# Patient Record
Sex: Male | Born: 1940 | Race: Black or African American | Hispanic: No | Smoking: Current every day smoker
Health system: Southern US, Community
[De-identification: ages and names within clinical notes are randomized; demographics above are authoritative.]

## PROBLEM LIST (undated history)

## (undated) DIAGNOSIS — R569 Unspecified convulsions: Secondary | ICD-10-CM

## (undated) DIAGNOSIS — C801 Malignant (primary) neoplasm, unspecified: Secondary | ICD-10-CM

## (undated) DIAGNOSIS — A5217 General paresis: Secondary | ICD-10-CM

## (undated) DIAGNOSIS — G9341 Metabolic encephalopathy: Secondary | ICD-10-CM

## (undated) DIAGNOSIS — R41841 Cognitive communication deficit: Secondary | ICD-10-CM

## (undated) DIAGNOSIS — A539 Syphilis, unspecified: Secondary | ICD-10-CM

## (undated) DIAGNOSIS — R1310 Dysphagia, unspecified: Secondary | ICD-10-CM

## (undated) DIAGNOSIS — R4182 Altered mental status, unspecified: Secondary | ICD-10-CM

## (undated) DIAGNOSIS — D72819 Decreased white blood cell count, unspecified: Secondary | ICD-10-CM

## (undated) DIAGNOSIS — I509 Heart failure, unspecified: Secondary | ICD-10-CM

## (undated) DIAGNOSIS — C61 Malignant neoplasm of prostate: Secondary | ICD-10-CM

## (undated) DIAGNOSIS — F209 Schizophrenia, unspecified: Secondary | ICD-10-CM

## (undated) DIAGNOSIS — J449 Chronic obstructive pulmonary disease, unspecified: Secondary | ICD-10-CM

## (undated) DIAGNOSIS — U071 COVID-19: Secondary | ICD-10-CM

## (undated) DIAGNOSIS — F0280 Dementia in other diseases classified elsewhere without behavioral disturbance: Secondary | ICD-10-CM

## (undated) DIAGNOSIS — R4189 Other symptoms and signs involving cognitive functions and awareness: Secondary | ICD-10-CM

## (undated) DIAGNOSIS — D696 Thrombocytopenia, unspecified: Secondary | ICD-10-CM

## (undated) DIAGNOSIS — R410 Disorientation, unspecified: Secondary | ICD-10-CM

## (undated) HISTORY — PX: LEG SURGERY: SHX1003

## (undated) HISTORY — DX: Malignant neoplasm of prostate: C61

## (undated) HISTORY — DX: Disorientation, unspecified: R41.0

## (undated) HISTORY — DX: Decreased white blood cell count, unspecified: D72.819

## (undated) HISTORY — DX: Thrombocytopenia, unspecified: D69.6

## (undated) HISTORY — DX: Hypercalcemia: E83.52

## (undated) HISTORY — DX: Altered mental status, unspecified: R41.82

---

## 2016-10-24 ENCOUNTER — Emergency Department: Payer: Medicare Other

## 2016-10-24 ENCOUNTER — Encounter: Payer: Self-pay | Admitting: *Deleted

## 2016-10-24 ENCOUNTER — Inpatient Hospital Stay
Admission: EM | Admit: 2016-10-24 | Discharge: 2016-10-28 | DRG: 641 | Disposition: A | Discharge status: Home / Self Care | Payer: Medicare Other | Attending: Internal Medicine | Admitting: Internal Medicine

## 2016-10-24 DIAGNOSIS — T50905A Adverse effect of unspecified drugs, medicaments and biological substances, initial encounter: Secondary | ICD-10-CM | POA: Diagnosis present

## 2016-10-24 DIAGNOSIS — Z923 Personal history of irradiation: Secondary | ICD-10-CM

## 2016-10-24 DIAGNOSIS — R4182 Altered mental status, unspecified: Secondary | ICD-10-CM | POA: Diagnosis present

## 2016-10-24 DIAGNOSIS — I959 Hypotension, unspecified: Secondary | ICD-10-CM | POA: Diagnosis present

## 2016-10-24 DIAGNOSIS — D6959 Other secondary thrombocytopenia: Secondary | ICD-10-CM | POA: Diagnosis present

## 2016-10-24 DIAGNOSIS — R0902 Hypoxemia: Secondary | ICD-10-CM | POA: Diagnosis not present

## 2016-10-24 DIAGNOSIS — D702 Other drug-induced agranulocytosis: Secondary | ICD-10-CM | POA: Diagnosis present

## 2016-10-24 DIAGNOSIS — R531 Weakness: Secondary | ICD-10-CM

## 2016-10-24 DIAGNOSIS — R296 Repeated falls: Secondary | ICD-10-CM | POA: Diagnosis present

## 2016-10-24 DIAGNOSIS — R413 Other amnesia: Secondary | ICD-10-CM | POA: Diagnosis present

## 2016-10-24 DIAGNOSIS — Y9301 Activity, walking, marching and hiking: Secondary | ICD-10-CM | POA: Diagnosis present

## 2016-10-24 DIAGNOSIS — W1830XA Fall on same level, unspecified, initial encounter: Secondary | ICD-10-CM | POA: Diagnosis present

## 2016-10-24 DIAGNOSIS — F17210 Nicotine dependence, cigarettes, uncomplicated: Secondary | ICD-10-CM | POA: Diagnosis present

## 2016-10-24 DIAGNOSIS — F209 Schizophrenia, unspecified: Secondary | ICD-10-CM | POA: Diagnosis present

## 2016-10-24 DIAGNOSIS — Z8546 Personal history of malignant neoplasm of prostate: Secondary | ICD-10-CM

## 2016-10-24 LAB — HEPATIC FUNCTION PANEL
ALK PHOS: 60 U/L (ref 38–126)
ALT: 87 U/L — ABNORMAL HIGH (ref 17–63)
AST: 67 U/L — ABNORMAL HIGH (ref 15–41)
Albumin: 3.7 g/dL (ref 3.5–5.0)
BILIRUBIN INDIRECT: 0.3 mg/dL (ref 0.3–0.9)
BILIRUBIN TOTAL: 0.4 mg/dL (ref 0.3–1.2)
Bilirubin, Direct: 0.1 mg/dL (ref 0.1–0.5)
TOTAL PROTEIN: 6.9 g/dL (ref 6.5–8.1)

## 2016-10-24 LAB — CBC
HEMATOCRIT: 45 % (ref 40.0–52.0)
HEMATOCRIT: 44.1 % (ref 40.0–52.0)
HEMOGLOBIN: 14.5 g/dL (ref 13.0–18.0)
HEMOGLOBIN: 14 g/dL (ref 13.0–18.0)
MCH: 28.1 pg (ref 26.0–34.0)
MCH: 27.9 pg (ref 26.0–34.0)
MCHC: 32.2 g/dL (ref 32.0–36.0)
MCHC: 31.7 g/dL — ABNORMAL LOW (ref 32.0–36.0)
MCV: 87.2 fL (ref 80.0–100.0)
MCV: 87.9 fL (ref 80.0–100.0)
Platelets: 88 10*3/uL — ABNORMAL LOW (ref 150–440)
Platelets: 94 10*3/uL — ABNORMAL LOW (ref 150–440)
RBC: 5.16 MIL/uL (ref 4.40–5.90)
RBC: 5.01 MIL/uL (ref 4.40–5.90)
RDW: 14.9 % — ABNORMAL HIGH (ref 11.5–14.5)
RDW: 14.6 % — ABNORMAL HIGH (ref 11.5–14.5)
WBC: 2.6 10*3/uL — ABNORMAL LOW (ref 3.8–10.6)
WBC: 3.6 10*3/uL — ABNORMAL LOW (ref 3.8–10.6)

## 2016-10-24 LAB — URINALYSIS COMPLETE WITH MICROSCOPIC (ARMC ONLY)
Bilirubin Urine: NEGATIVE
Glucose, UA: NEGATIVE mg/dL
Hgb urine dipstick: NEGATIVE
Leukocytes, UA: NEGATIVE
Nitrite: NEGATIVE
PH: 6 (ref 5.0–8.0)
PROTEIN: NEGATIVE mg/dL
Specific Gravity, Urine: 1.014 (ref 1.005–1.030)

## 2016-10-24 LAB — BASIC METABOLIC PANEL
ANION GAP: 3 — AB (ref 5–15)
BUN: 13 mg/dL (ref 6–20)
CHLORIDE: 111 mmol/L (ref 101–111)
CO2: 30 mmol/L (ref 22–32)
Calcium: 11.9 mg/dL — ABNORMAL HIGH (ref 8.9–10.3)
Creatinine, Ser: 1.03 mg/dL (ref 0.61–1.24)
GFR calc non Af Amer: >60 mL/min (ref >60)
GLUCOSE: 89 mg/dL (ref 65–99)
POTASSIUM: 4.2 mmol/L (ref 3.5–5.1)
Sodium: 144 mmol/L (ref 135–145)

## 2016-10-24 LAB — TSH: TSH: 0.898 u[IU]/mL (ref 0.350–4.500)

## 2016-10-24 LAB — CREATININE, SERUM
CREATININE: 0.79 mg/dL (ref 0.61–1.24)
GFR calc Af Amer: >60 mL/min (ref >60)
GFR calc non Af Amer: >60 mL/min (ref >60)

## 2016-10-24 LAB — GLUCOSE, CAPILLARY: Glucose-Capillary: 82 mg/dL (ref 65–99)

## 2016-10-24 LAB — TROPONIN I: Troponin I: <0.03 ng/mL (ref <0.03)

## 2016-10-24 LAB — MAGNESIUM: MAGNESIUM: 2.2 mg/dL (ref 1.7–2.4)

## 2016-10-24 LAB — PHOSPHORUS: PHOSPHORUS: 1.9 mg/dL — AB (ref 2.5–4.6)

## 2016-10-24 MED ORDER — ACETAMINOPHEN 650 MG RE SUPP: 650.0000 mg | Freq: Four times a day (QID) | RECTAL | Status: DC | PRN

## 2016-10-24 MED ORDER — ONDANSETRON HCL 4 MG/2ML IJ SOLN: 4.0000 mg | Freq: Four times a day (QID) | INTRAMUSCULAR | Status: DC | PRN

## 2016-10-24 MED ORDER — SODIUM CHLORIDE 0.9 % IV SOLN
INTRAVENOUS | Status: DC
Start: 2016-10-24 — End: 2016-10-26
  Administered 2016-10-24 – 2016-10-26 (×3): via INTRAVENOUS

## 2016-10-24 MED ORDER — SODIUM CHLORIDE 0.9 % IV SOLN
Freq: Once | INTRAVENOUS | Status: AC
  Administered 2016-10-24: 18:00:00 via INTRAVENOUS

## 2016-10-24 MED ORDER — ENOXAPARIN SODIUM 40 MG/0.4ML ~~LOC~~ SOLN
40.0000 mg | SUBCUTANEOUS | Status: DC
  Administered 2016-10-24: 40 mg via SUBCUTANEOUS
  Filled 2016-10-24: qty 0.4

## 2016-10-24 MED ORDER — ZOLPIDEM TARTRATE 5 MG PO TABS: 5.0000 mg | ORAL_TABLET | Freq: Every evening | ORAL | Status: DC | PRN

## 2016-10-24 MED ORDER — ACETAMINOPHEN 325 MG PO TABS: 650.0000 mg | ORAL_TABLET | Freq: Four times a day (QID) | ORAL | Status: DC | PRN

## 2016-10-24 MED ORDER — HYDROCODONE-ACETAMINOPHEN 5-325 MG PO TABS
1.0000 | ORAL_TABLET | ORAL | Status: DC | PRN
  Administered 2016-10-25: 2 via ORAL
  Filled 2016-10-24: qty 2

## 2016-10-24 MED ORDER — SENNOSIDES-DOCUSATE SODIUM 8.6-50 MG PO TABS: 1.0000 | ORAL_TABLET | Freq: Every evening | ORAL | Status: DC | PRN

## 2016-10-24 MED ORDER — SODIUM CHLORIDE 0.9% FLUSH
3.0000 mL | Freq: Two times a day (BID) | INTRAVENOUS | Status: DC
  Administered 2016-10-25 – 2016-10-26 (×2): 3 mL via INTRAVENOUS

## 2016-10-24 MED ORDER — MAGNESIUM CITRATE PO SOLN
1.0000 | Freq: Once | ORAL | Status: DC | PRN
Start: 2016-10-24 — End: 2016-10-28

## 2016-10-24 MED ORDER — ONDANSETRON HCL 4 MG PO TABS: 4.0000 mg | ORAL_TABLET | Freq: Four times a day (QID) | ORAL | Status: DC | PRN

## 2016-10-24 MED ORDER — BISACODYL 5 MG PO TBEC: 5.0000 mg | DELAYED_RELEASE_TABLET | Freq: Every day | ORAL | Status: DC | PRN

## 2016-10-24 NOTE — Progress Notes (Signed)
Patient admitted to floor; denies pain; alert and oriented; no distress observed; meds as ordered; Resident expressed reside at [facility] but unable to provide number to Home.

## 2016-10-24 NOTE — ED Notes (Signed)
Pt transported to room 205 
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
## 2016-10-24 NOTE — ED Triage Notes (Signed)
Per EMS report, patient walked from [facility] to a gas station to buy chicken. A bystander observed him fallling x2. Patient states he was fatigued from walking. EMS report mild confusion with the patient. No LOC/injuries reported. First B/P was 90 palp, last B/P was [id number] systolic.

## 2016-10-24 NOTE — ED Notes (Signed)
Pt given sand which tray and ginger ale.

## 2016-10-24 NOTE — ED Notes (Signed)
Patient transported to X-ray 
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
## 2016-10-24 NOTE — ED Notes (Signed)
Attempted IV x 1 to left forearm without success.

## 2016-10-25 ENCOUNTER — Encounter: Payer: Self-pay | Admitting: *Deleted

## 2016-10-25 DIAGNOSIS — D696 Thrombocytopenia, unspecified: Secondary | ICD-10-CM

## 2016-10-25 DIAGNOSIS — R0902 Hypoxemia: Secondary | ICD-10-CM | POA: Diagnosis not present

## 2016-10-25 DIAGNOSIS — Y9301 Activity, walking, marching and hiking: Secondary | ICD-10-CM | POA: Diagnosis present

## 2016-10-25 DIAGNOSIS — I959 Hypotension, unspecified: Secondary | ICD-10-CM | POA: Diagnosis present

## 2016-10-25 DIAGNOSIS — R4182 Altered mental status, unspecified: Secondary | ICD-10-CM | POA: Diagnosis not present

## 2016-10-25 DIAGNOSIS — Z923 Personal history of irradiation: Secondary | ICD-10-CM

## 2016-10-25 DIAGNOSIS — D6959 Other secondary thrombocytopenia: Secondary | ICD-10-CM | POA: Diagnosis present

## 2016-10-25 DIAGNOSIS — Z79899 Other long term (current) drug therapy: Secondary | ICD-10-CM

## 2016-10-25 DIAGNOSIS — F17210 Nicotine dependence, cigarettes, uncomplicated: Secondary | ICD-10-CM | POA: Diagnosis present

## 2016-10-25 DIAGNOSIS — Z8546 Personal history of malignant neoplasm of prostate: Secondary | ICD-10-CM | POA: Diagnosis not present

## 2016-10-25 DIAGNOSIS — F209 Schizophrenia, unspecified: Secondary | ICD-10-CM | POA: Diagnosis not present

## 2016-10-25 DIAGNOSIS — D702 Other drug-induced agranulocytosis: Secondary | ICD-10-CM | POA: Diagnosis present

## 2016-10-25 DIAGNOSIS — R296 Repeated falls: Secondary | ICD-10-CM | POA: Diagnosis present

## 2016-10-25 DIAGNOSIS — F203 Undifferentiated schizophrenia: Secondary | ICD-10-CM | POA: Diagnosis not present

## 2016-10-25 DIAGNOSIS — R413 Other amnesia: Secondary | ICD-10-CM | POA: Diagnosis present

## 2016-10-25 DIAGNOSIS — R531 Weakness: Secondary | ICD-10-CM | POA: Diagnosis present

## 2016-10-25 DIAGNOSIS — W1830XA Fall on same level, unspecified, initial encounter: Secondary | ICD-10-CM | POA: Diagnosis present

## 2016-10-25 DIAGNOSIS — T50905A Adverse effect of unspecified drugs, medicaments and biological substances, initial encounter: Secondary | ICD-10-CM | POA: Diagnosis present

## 2016-10-25 DIAGNOSIS — D649 Anemia, unspecified: Secondary | ICD-10-CM

## 2016-10-25 LAB — URINE DRUG SCREEN, QUALITATIVE (ARMC ONLY)
Amphetamines, Ur Screen: NOT DETECTED
Barbiturates, Ur Screen: NOT DETECTED
Benzodiazepine, Ur Scrn: NOT DETECTED
Cannabinoid 50 Ng, Ur ~~LOC~~: NOT DETECTED
Cocaine Metabolite,Ur ~~LOC~~: NOT DETECTED
MDMA (Ecstasy)Ur Screen: NOT DETECTED
Methadone Scn, Ur: NOT DETECTED
Opiate, Ur Screen: NOT DETECTED
Phencyclidine (PCP) Ur S: NOT DETECTED
Tricyclic, Ur Screen: NOT DETECTED

## 2016-10-25 LAB — CBC
HCT: 42.6 % (ref 40.0–52.0)
Hemoglobin: 13.6 g/dL (ref 13.0–18.0)
MCH: 28 pg (ref 26.0–34.0)
MCHC: 31.8 g/dL — AB (ref 32.0–36.0)
MCV: 88 fL (ref 80.0–100.0)
PLATELETS: 99 10*3/uL — AB (ref 150–440)
RBC: 4.84 MIL/uL (ref 4.40–5.90)
RDW: 14.8 % — AB (ref 11.5–14.5)
WBC: 3.6 10*3/uL — ABNORMAL LOW (ref 3.8–10.6)

## 2016-10-25 LAB — TROPONIN I

## 2016-10-25 LAB — LIPID PANEL
Cholesterol: 118 mg/dL (ref 0–200)
HDL: 39 mg/dL — AB (ref >40)
LDL CALC: 59 mg/dL (ref 0–99)
Total CHOL/HDL Ratio: 3 RATIO
Triglycerides: 100 mg/dL (ref <150)
VLDL: 20 mg/dL (ref 0–40)

## 2016-10-25 LAB — BASIC METABOLIC PANEL
ANION GAP: 4 — AB (ref 5–15)
BUN: 11 mg/dL (ref 6–20)
CALCIUM: 10.7 mg/dL — AB (ref 8.9–10.3)
CO2: 26 mmol/L (ref 22–32)
CREATININE: 0.81 mg/dL (ref 0.61–1.24)
Chloride: 115 mmol/L — ABNORMAL HIGH (ref 101–111)
GFR calc Af Amer: >60 mL/min (ref >60)
GLUCOSE: 109 mg/dL — AB (ref 65–99)
Potassium: 3.6 mmol/L (ref 3.5–5.1)
Sodium: 145 mmol/L (ref 135–145)

## 2016-10-25 LAB — MRSA PCR SCREENING: MRSA BY PCR: NEGATIVE

## 2016-10-25 NOTE — Progress Notes (Addendum)
[place] [facility] [place] NAME: Henry Smith    MR#:  [id number]  DATE OF BIRTH:  Apr 23, 1941  SUBJECTIVE:  Came in after he was found wandering around the gas station has had multiple falls  REVIEW OF SYSTEMS:   Review of Systems  Unable to perform ROS: Mental status change   Tolerating Diet: Yes Tolerating PT: Pending  DRUG ALLERGIES:  No Known Allergies  VITALS:  Blood pressure 129/70, pulse 73, temperature 97.7 F (36.5 C), temperature source Oral, resp. rate 18, height 5\' 11"  (1.803 m), weight 108.3 kg (238 lb 12.8 oz), SpO2 100 %.  PHYSICAL EXAMINATION:   Physical Exam  GENERAL:  75 y.o.-year-old patient lying in the bed with no acute distress.  EYES: Pupils equal, round, reactive to light and accommodation. No scleral icterus. Extraocular muscles intact.  HEENT: Head atraumatic, normocephalic. Oropharynx and nasopharynx clear.  NECK:  Supple, no jugular venous distention. No thyroid enlargement, no tenderness.  LUNGS: Normal breath sounds bilaterally, no wheezing, rales, rhonchi. No use of accessory muscles of respiration.  CARDIOVASCULAR: S1, S2 normal. No murmurs, rubs, or gallops.  ABDOMEN: Soft, nontender, nondistended. Bowel sounds present. No organomegaly or mass.  EXTREMITIES: No cyanosis, clubbing or edema b/l.    NEUROLOGIC: Cranial nerves II through XII are intact. No focal Motor or sensory deficits b/l.   PSYCHIATRIC:  patient is alert and oriented x 3.  SKIN: No obvious rash, lesion, or ulcer.   LABORATORY PANEL:  CBC  Recent Labs Lab 10/25/16 0440  WBC 3.6*  HGB 13.6  HCT 42.6  PLT 99*    Chemistries   Recent Labs Lab 10/24/16 1559 10/24/16 2248 10/25/16 0440  NA 144  --  145  K 4.2  --  3.6  CL 111  --  115*  CO2 30  --  26  GLUCOSE 89  --  109*  BUN 13  --  11  CREATININE 1.03 0.79 0.81  CALCIUM 11.9*  --  10.7*  MG  --  2.2  --   AST 67*  --   --   ALT 87*  --   --   ALKPHOS 60   --   --   BILITOT 0.4  --   --    Cardiac Enzymes  Recent Labs Lab 10/25/16 1107  TROPONINI <0.03   RADIOLOGY:  Dg Chest 2 View  Result Date: 10/24/2016 CLINICAL DATA:  Weakness and confusion.  Patient fell twice today. EXAM: CHEST  2 VIEW COMPARISON:  None. FINDINGS: There is platelike atelectasis or scarring in the right lower lobe. The heart is normal in size. The aorta is slightly uncoiled in appearance. No pneumonic consolidation, effusion or pneumothorax. No suspicious osseous abnormality. Slight multilevel degenerative disc disease along the upper and mid thoracic spine. IMPRESSION: Platelike atelectasis and/or scarring at the right base. No acute cardiopulmonary disease. No suspicious osseous abnormalities Electronically Signed   By: Ashley Royalty M.D.   On: 10/24/2016 18:56   Ct Head Wo Contrast  Result Date: 10/24/2016 CLINICAL DATA:  Patient fell twice. EXAM: CT HEAD WITHOUT CONTRAST TECHNIQUE: Contiguous axial images were obtained from the base of the skull through the vertex without intravenous contrast. COMPARISON:  None. FINDINGS: Brain: There is no evidence for acute hemorrhage, hydrocephalus, mass lesion, or abnormal extra-axial fluid collection. No definite CT evidence for acute infarction. Patchy low attenuation in the deep hemispheric and periventricular white matter is nonspecific, but likely reflects chronic microvascular ischemic  demyelination. Vascular: No hyperdense vessel or unexpected calcification. Skull: No evidence for fracture. Multiple tiny sclerotic lesions are identified in the skull. Sinuses/Orbits: The visualized paranasal sinuses and mastoid air cells are clear. Visualized portions of the globes and intraorbital fat are unremarkable. Other: None. IMPRESSION: 1. No acute intracranial abnormality. 2. Scattered tiny sclerotic foci in the skull. Although nonspecific, imaging features raise concern for metastatic disease. Nuclear medicine bone scan may prove helpful  to further evaluate if clinically warranted. Electronically Signed   By: Misty Stanley M.D.   On: 10/24/2016 17:29   ASSESSMENT AND PLAN:  Henry Smith is a 75 y.o. male with unknown PMH presents to the emergency department by EMSFor altered mental status. Patient reportedly lives at a retirement home in [place]. Per emergency department records patient was walking to a gas station when bystanders found him to have unsteady gait, frequent falls, appeared to be confused, diaphoretic and weak. Patient recalls going to the gas station but does not recall any of the events described by bystanders. Patient is unable to give a history of past medical problems with the exception of prostate cancer remotely.  1. Altered mental status - given the patient's concerning story per bystanders, unclear history and information about patient's current medical condition  -Patient mentating well however not the best historian. Unable to reach any family members. He gave me a phone number for his brother in [place] left a message.  2. Hypercalcemia. Head CT suggests concern for metastatic disease given sclerotic foci in the skull. Given history of prostate cancer this may indicate a recurrence. - We will check an intact PTH, phosphorus and vitamin D level.  - hydrate with IV normal saline and recheck electrolytes in a.m. -Oncology consultation  3. Leukopenia and thrombocytopenia-again we have no values for comparison, unclear whether this is acute or chronic however we will recheck CBC in a.m. And again may be related to malignancy?  Case discussed with Care Management/Social Worker. Management plans discussed with the patient, family and they are in agreement.  CODE STATUS: Full  DVT Prophylaxis: SCD teds  TOTAL TIME TAKING CARE OF THIS PATIENT: 40 minutes.  >50% time spent on counselling and coordination of care  POSSIBLE D/C IN 1-2 DAYS, DEPENDING ON CLINICAL CONDITION.  Note: This  dictation was prepared with Dragon dictation along with smaller phrase technology. Any transcriptional errors that result from this process are unintentional.  Rhett Najera M.D on 10/25/2016 at 12:21 PM  Between 7am to 6pm - Pager - [phone]  After 6pm go to [email or web address] - password [email or web address] [facility]  [facility]  Office  [phone]  CC: Primary care physician; No PCP Per Patient

## 2016-10-25 NOTE — Clinical Social Work Note (Signed)
Clinical Social Work Assessment  Patient Details  Name: Henry Smith MRN: [id number] Date of Birth: Aug 18, 1941  Date of referral:  10/25/16               Reason for consult:  Housing Concerns/Homelessness (Client unable to give phone number for his Care Home. )                Permission sought to share information with:  Facility Art therapist granted to share information::  Yes, Verbal Permission Granted  Name::        Agency::     Relationship::     Contact Information:     Housing/Transportation Living arrangements for the past 2 months:  Group Home Source of Information:  Facility Patient Interpreter Needed:  None Criminal Activity/Legal Involvement Pertinent to Current Situation/Hospitalization:  No - Comment as needed Significant Relationships:  Warehouse manager Lives with:  Facility Resident Do you feel safe going back to the place where you live?  Yes Need for family participation in patient care:  No (Coment)  Care giving concerns:  Unsure of housing situation   Facilities manager / plan:  The patient is under obs post 2 witnessed falls in the community when he was walking from his care home to a local gas station frequented by residents of the home. CSW was able to contact the [facility] [phone] to confirm that the patient is a resident. The patient at baseline is able to ambulate with no assistance and is continent as well as independent with all ADLs and IADLs. The patient rarely leaves the facility, according to the facility staff, but she did note that he had signed out and followed proper procedure when leaving. The patient is welcome to return to the [facility] when stable, and they will transport him.  CSW will con't to follow pending any dc planning needs other than such for the [place] (escalation of level of care, etc.).    Employment status:  Retired Forensic scientist:  Medicare PT Recommendations:  Not  assessed at this time Information / Referral to community resources:     Patient/Family's Response to care:  Facility representative thanked CSW for update and taking time to confirm his residency.  Patient/Family's Understanding of and Emotional Response to Diagnosis, Current Treatment, and Prognosis:  Facility understands that patient is under obs post fall and are planning to institute a fall risk protocol once he returns.  Emotional Assessment Appearance:  Appears stated age Attitude/Demeanor/Rapport:   (Cooperative) Affect (typically observed):  Accepting, Appropriate Orientation:  Oriented to Self, Oriented to Place, Oriented to  Time, Oriented to Situation Alcohol / Substance use:  Never Used Psych involvement (Current and /or in the community):  No (Comment)  Discharge Needs  Concerns to be addressed:  Discharge Planning Concerns Readmission within the last 30 days:  No Current discharge risk:  None Barriers to Discharge:  Continued Medical Work up   [employer], LCSW 10/25/2016, 9:37 AM
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
## 2016-10-25 NOTE — NC FL2 (Signed)
[place] LEVEL OF CARE SCREENING TOOL     IDENTIFICATION  Patient Name: Henry Smith Birthdate: 04-18-1941 Sex: male Admission Date (Current Location): 10/24/2016  [facility] and [place] Number:  Engineering geologist and Address:  [facility], [address] [place], [place] [address]      Provider Number: [id number]  Attending Physician Name and Address:  Epifanio Lesches, MD  Relative Name and Phone Number:       Current Level of Care: Hospital Recommended Level of Care: [place] Prior Approval Number:    Date Approved/Denied:   PASRR Number:    Discharge Plan: Domiciliary (Rest home)    Current Diagnoses: Patient Active Problem List   Diagnosis Date Noted  . Altered mental status 10/24/2016    Orientation RESPIRATION BLADDER Height & Weight     Self, Time, Situation, Place  Normal Continent Weight: 238 lb 12.8 oz (108.3 kg) Height:  5\' 11"  (180.3 cm)  BEHAVIORAL SYMPTOMS/MOOD NEUROLOGICAL BOWEL NUTRITION STATUS      Continent    AMBULATORY STATUS COMMUNICATION OF NEEDS Skin   Independent Verbally Bruising                       Personal Care Assistance Level of Assistance  Bathing, Feeding, Dressing Bathing Assistance: Independent Feeding assistance: Independent Dressing Assistance: Independent     Functional Limitations Info             SPECIAL CARE FACTORS FREQUENCY                       Contractures Contractures Info: Present    Additional Factors Info                  Current Medications (10/25/2016):  This is the current hospital active medication list Current Facility-Administered Medications  Medication Dose Route Frequency Provider Last Rate Last Dose  . 0.9 %  sodium chloride infusion   Intravenous Continuous Alexis Hugelmeyer, DO 75 mL/hr at 10/24/16 2236    . acetaminophen (TYLENOL) tablet 650 mg  650 mg Oral Q6H PRN Alexis Hugelmeyer, DO       Or  .  acetaminophen (TYLENOL) suppository 650 mg  650 mg Rectal Q6H PRN Alexis Hugelmeyer, DO      . bisacodyl (DULCOLAX) EC tablet 5 mg  5 mg Oral Daily PRN [employer], DO      . HYDROcodone-acetaminophen (NORCO/VICODIN) 5-325 MG per tablet 1-2 tablet  1-2 tablet Oral Q4H PRN Alexis Hugelmeyer, DO   2 tablet at 10/25/16 0246  . magnesium citrate solution 1 Bottle  1 Bottle Oral Once PRN Alexis Hugelmeyer, DO      . ondansetron (ZOFRAN) tablet 4 mg  4 mg Oral Q6H PRN Alexis Hugelmeyer, DO       Or  . ondansetron (ZOFRAN) injection 4 mg  4 mg Intravenous Q6H PRN Alexis Hugelmeyer, DO      . senna-docusate (Senokot-S) tablet 1 tablet  1 tablet Oral QHS PRN Alexis Hugelmeyer, DO      . sodium chloride flush (NS) 0.9 % injection 3 mL  3 mL Intravenous Q12H Alexis Hugelmeyer, DO      . zolpidem (AMBIEN) tablet 5 mg  5 mg Oral QHS PRN Alexis Hugelmeyer, DO         Discharge Medications: Please see discharge summary for a list of discharge medications.  Relevant Imaging Results:  Relevant Lab Results:   Additional Information  Zettie Pho, LCSW   Updated Evette Cristal, MSW, 10-28-16

## 2016-10-26 ENCOUNTER — Inpatient Hospital Stay: Payer: Medicare Other

## 2016-10-26 DIAGNOSIS — F203 Undifferentiated schizophrenia: Secondary | ICD-10-CM

## 2016-10-26 LAB — CBC WITH DIFFERENTIAL/PLATELET
Basophils Absolute: 0 10*3/uL (ref 0–0.1)
Basophils Relative: 1 %
Eosinophils Absolute: 0 10*3/uL (ref 0–0.7)
Eosinophils Relative: 2 %
HCT: 43 % (ref 40.0–52.0)
Hemoglobin: 14.1 g/dL (ref 13.0–18.0)
Lymphocytes Relative: 35 %
Lymphs Abs: 1 10*3/uL (ref 1.0–3.6)
MCH: 28.2 pg (ref 26.0–34.0)
MCHC: 32.9 g/dL (ref 32.0–36.0)
MCV: 85.7 fL (ref 80.0–100.0)
Monocytes Absolute: 0.4 10*3/uL (ref 0.2–1.0)
Monocytes Relative: 16 %
Neutro Abs: 1.3 10*3/uL — ABNORMAL LOW (ref 1.4–6.5)
Neutrophils Relative %: 46 %
Platelets: 90 10*3/uL — ABNORMAL LOW (ref 150–440)
RBC: 5.02 MIL/uL (ref 4.40–5.90)
RDW: 14.6 % — ABNORMAL HIGH (ref 11.5–14.5)
WBC: 2.8 10*3/uL — ABNORMAL LOW (ref 3.8–10.6)

## 2016-10-26 LAB — CBC
HCT: 43.8 % (ref 40.0–52.0)
Hemoglobin: 14.2 g/dL (ref 13.0–18.0)
MCH: 27.7 pg (ref 26.0–34.0)
MCHC: 32.4 g/dL (ref 32.0–36.0)
MCV: 85.5 fL (ref 80.0–100.0)
PLATELETS: 88 10*3/uL — AB (ref 150–440)
RBC: 5.12 MIL/uL (ref 4.40–5.90)
RDW: 14.3 % (ref 11.5–14.5)
WBC: 3.2 10*3/uL — AB (ref 3.8–10.6)

## 2016-10-26 LAB — COMPREHENSIVE METABOLIC PANEL
ALBUMIN: 3.5 g/dL (ref 3.5–5.0)
ALT: 75 U/L — AB (ref 17–63)
AST: 55 U/L — AB (ref 15–41)
Alkaline Phosphatase: 62 U/L (ref 38–126)
Anion gap: 5 (ref 5–15)
BUN: 13 mg/dL (ref 6–20)
CHLORIDE: 109 mmol/L (ref 101–111)
CO2: 26 mmol/L (ref 22–32)
CREATININE: 0.77 mg/dL (ref 0.61–1.24)
Calcium: 10.9 mg/dL — ABNORMAL HIGH (ref 8.9–10.3)
GFR calc Af Amer: >60 mL/min (ref >60)
GFR calc non Af Amer: >60 mL/min (ref >60)
GLUCOSE: 95 mg/dL (ref 65–99)
POTASSIUM: 4.3 mmol/L (ref 3.5–5.1)
Sodium: 140 mmol/L (ref 135–145)
Total Bilirubin: 0.7 mg/dL (ref 0.3–1.2)
Total Protein: 6.4 g/dL — ABNORMAL LOW (ref 6.5–8.1)

## 2016-10-26 LAB — URINE CULTURE: CULTURE: NO GROWTH

## 2016-10-26 LAB — VITAMIN B12: Vitamin B-12: 556 pg/mL (ref 180–914)

## 2016-10-26 LAB — GLUCOSE, CAPILLARY: Glucose-Capillary: 116 mg/dL — ABNORMAL HIGH (ref 65–99)

## 2016-10-26 LAB — FOLATE: Folate: 14.1 ng/mL (ref >5.9)

## 2016-10-26 LAB — TSH: TSH: 3.144 u[IU]/mL (ref 0.350–4.500)

## 2016-10-26 LAB — PSA: PSA: 1.15 ng/mL (ref 0.00–4.00)

## 2016-10-26 LAB — PARATHYROID HORMONE, INTACT (NO CA): PTH: 71 pg/mL — ABNORMAL HIGH (ref 15–65)

## 2016-10-26 MED ORDER — OLANZAPINE 10 MG PO TABS
10.0000 mg | ORAL_TABLET | Freq: Two times a day (BID) | ORAL | Status: DC
  Administered 2016-10-26 – 2016-10-28 (×4): 10 mg via ORAL
  Filled 2016-10-26 (×4): qty 1

## 2016-10-26 MED ORDER — AMLODIPINE BESYLATE 10 MG PO TABS
10.0000 mg | ORAL_TABLET | Freq: Every day | ORAL | Status: DC
  Administered 2016-10-26 – 2016-10-28 (×3): 10 mg via ORAL
  Filled 2016-10-26 (×3): qty 1

## 2016-10-26 MED ORDER — GABAPENTIN 300 MG PO CAPS
300.0000 mg | ORAL_CAPSULE | Freq: Three times a day (TID) | ORAL | Status: DC
  Administered 2016-10-26 – 2016-10-28 (×5): 300 mg via ORAL
  Filled 2016-10-26 (×5): qty 1

## 2016-10-26 MED ORDER — DIVALPROEX SODIUM ER 500 MG PO TB24
500.0000 mg | ORAL_TABLET | Freq: Every day | ORAL | Status: DC
  Administered 2016-10-26 – 2016-10-28 (×3): 500 mg via ORAL
  Filled 2016-10-26 (×3): qty 1

## 2016-10-26 MED ORDER — DIVALPROEX SODIUM ER 250 MG PO TB24
250.0000 mg | ORAL_TABLET | Freq: Every day | ORAL | Status: DC
  Administered 2016-10-27: 250 mg via ORAL
  Filled 2016-10-26 (×2): qty 1

## 2016-10-26 NOTE — Progress Notes (Signed)
Pt alert but not very talkative. CH is available.   10/26/16 0955  Clinical Encounter Type  Visited With Patient  Visit Type Initial  Referral From Nurse  Spiritual Encounters  Spiritual Needs Emotional  Stress Factors  Patient Stress Factors None identified
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
## 2016-10-26 NOTE — Significant Event (Signed)
Rapid Response Event Note  Overview: Time Called: 2208 Arrival Time: 2210 Event Type: Neurologic  Initial Focused Assessment: Pt laying on left side, minimally responsive verbally. Bedside RN reports pt is decreased in responses from what was given in report.  VSS, on room air, no complaints of pain, dizziness, or lightheadedness.  Pt answers appropriately when asked his name.  Interventions: Dr. Almyra Free informed prior to RR called; orders placed for heat CT, CBC, and CMP.  ABG and EKG ordered per RR protocol. New IV started and labs drawn.  Plan of Care (if not transferred):  MD at bedside to evaluate patient.  Pt to go to CT at this time.  Event Summary: Name of Physician Notified: Dr. Almyra Free at 2205  Outcome: Stayed in room and stabalized  Event End Time: 2230  Zettie Pho

## 2016-10-26 NOTE — Progress Notes (Signed)
Patient ID: Henry Smith, male   DOB: [birth year], 75 y.o.   MRN: [id number]  Called by nurse regarding patient's altered mental status. Patient was found to have a significant deviation from baseline mental status. Although patient was awake he was minimally responsive, not communicative. Per nursing his medications have been reordered including Depakote, gabapentin and Zyprexa however he had not had any sedatives, anxiolytics, narcotics or antihistamines this evening. By phone I ordered a stat head CT, stat CBC and CMP.  On my arrival to the room, rapid response was in progress.  Physical examination during rapid response: Vital signs were stable. Blood sugar was within normal limits Gen. Well-developed well-nourished, lying flat in bed, awake but minimally responsive to verbal and tactile stimuli. Responsive to painful stimuli. Patient was yawning throughout the entire interview. HEENT: Pupils were equally reactive. Mucous membranes moist Heart was regular, lungs clear to auscultation Abdomen soft nontender nondistended Neuro: Patient was awake and able to follow some commands including hand grip bilaterally with equal strength and could stick out and move his tongue however he could not state his name could not lift his arms or legs.  Labs ordered earlier in the day included a negative urine drug screen.  Assessment: 75 year old black male with a remote history of prostate cancer who was admitted yesterday with altered mental status, unclear etiology, hyper calcemic, leukopenia and thrombocytopenia concerning for malignancy now with altered mental status.  Plan: Send for stat head CT, check CBC, CMP, ABG, Depakote level and EKG.  Update: Please note on return from head CT scan, patient was fully alert, conversive. He denied any symptoms of chest pain, lightheadedness, dizziness, confusion. He was requesting a sandwich. He was able to fully comply with neurologic exam. He was alert and  oriented 3, pupils were equal and reactive, full muscle strength 5 out of 5 in 4 extremities. His speech was clear and cohesive and he was able to follow all commands.   Head CT was found to be negative for any acute changes. CBC and CMP are essentially unremarkable. ABG was grossly normal with the exception of hypoxemia with an O2 of 53 for which we'll monitor with continuous pulse ox and administer continuous O2 via nasal cannula. Patient remains at his baseline mental status.nursing will continue to monitor and update me with any changes. I will follow up the remaining labs that are pending.

## 2016-10-26 NOTE — Progress Notes (Signed)
[facility] [place] NAME: Henry Smith    MR#:  [id number]  DATE OF BIRTH:  [birth year]  SUBJECTIVE:  Came in after he was found wandering around the gas station has had multiple falls. No concerns today.  REVIEW OF SYSTEMS:   Review of Systems  Unable to perform ROS: Mental status change   Tolerating Diet: Yes Tolerating PT: Pending  DRUG ALLERGIES:  No Known Allergies  VITALS:  Blood pressure (!) 145/79, pulse 62, temperature 97.5 F (36.4 C), temperature source Oral, resp. rate 20, height 5\' 11"  (1.803 m), weight 108.3 kg (238 lb 12.8 oz), SpO2 100 %.  PHYSICAL EXAMINATION:   Physical Exam  GENERAL:  75 y.o.-year-old patient lying in the bed with no acute distress. Morbidly obese EYES: Pupils equal, round, reactive to light and accommodation. No scleral icterus. Extraocular muscles intact.  HEENT: Head atraumatic, normocephalic. Oropharynx and nasopharynx clear.  NECK:  Supple, no jugular venous distention. No thyroid enlargement, no tenderness.  LUNGS: Normal breath sounds bilaterally, no wheezing, rales, rhonchi. No use of accessory muscles of respiration.  CARDIOVASCULAR: S1, S2 normal. No murmurs, rubs, or gallops.  ABDOMEN: Soft, nontender, nondistended. Bowel sounds present. No organomegaly or mass.  EXTREMITIES: No cyanosis, clubbing or edema b/l.    NEUROLOGIC: Cranial nerves II through XII are intact. No focal Motor or sensory deficits b/l.   PSYCHIATRIC:  patient is alert and Awake. Oriented to place but not time or person. SKIN: No obvious rash, lesion, or ulcer.   LABORATORY PANEL:  CBC  Recent Labs Lab 10/26/16 0636  WBC 2.8*  HGB 14.1  HCT 43.0  PLT 90*    Chemistries   Recent Labs Lab 10/24/16 1559 10/24/16 2248 10/25/16 0440  NA 144  --  145  K 4.2  --  3.6  CL 111  --  115*  CO2 30  --  26  GLUCOSE 89  --  109*  BUN 13  --  11  CREATININE 1.03 0.79 0.81  CALCIUM 11.9*  --  10.7*   MG  --  2.2  --   AST 67*  --   --   ALT 87*  --   --   ALKPHOS 60  --   --   BILITOT 0.4  --   --    Cardiac Enzymes  Recent Labs Lab 10/25/16 1107  TROPONINI <0.03   RADIOLOGY:  Dg Chest 2 View  Result Date: 10/24/2016 CLINICAL DATA:  Weakness and confusion.  Patient fell twice today. EXAM: CHEST  2 VIEW COMPARISON:  None. FINDINGS: There is platelike atelectasis or scarring in the right lower lobe. The heart is normal in size. The aorta is slightly uncoiled in appearance. No pneumonic consolidation, effusion or pneumothorax. No suspicious osseous abnormality. Slight multilevel degenerative disc disease along the upper and mid thoracic spine. IMPRESSION: Platelike atelectasis and/or scarring at the right base. No acute cardiopulmonary disease. No suspicious osseous abnormalities Electronically Signed   By: Ashley Royalty M.D.   On: 10/24/2016 18:56   Ct Head Wo Contrast  Result Date: 10/24/2016 CLINICAL DATA:  Patient fell twice. EXAM: CT HEAD WITHOUT CONTRAST TECHNIQUE: Contiguous axial images were obtained from the base of the skull through the vertex without intravenous contrast. COMPARISON:  None. FINDINGS: Brain: There is no evidence for acute hemorrhage, hydrocephalus, mass lesion, or abnormal extra-axial fluid collection. No definite CT evidence for acute infarction. Patchy low attenuation in the deep hemispheric and periventricular  white matter is nonspecific, but likely reflects chronic microvascular ischemic demyelination. Vascular: No hyperdense vessel or unexpected calcification. Skull: No evidence for fracture. Multiple tiny sclerotic lesions are identified in the skull. Sinuses/Orbits: The visualized paranasal sinuses and mastoid air cells are clear. Visualized portions of the globes and intraorbital fat are unremarkable. Other: None. IMPRESSION: 1. No acute intracranial abnormality. 2. Scattered tiny sclerotic foci in the skull. Although nonspecific, imaging features raise  concern for metastatic disease. Nuclear medicine bone scan may prove helpful to further evaluate if clinically warranted. Electronically Signed   By: Misty Stanley M.D.   On: 10/24/2016 17:29   ASSESSMENT AND PLAN:  Henry Smith is a 75 y.o. male with unknown PMH presents to the emergency department by EMSFor altered mental status. Patient reportedly lives at a retirement home in [place]. Per emergency department records patient was walking to a gas station when bystanders found him to have unsteady gait, frequent falls, appeared to be confused, diaphoretic and weak. Patient recalls going to the gas station but does not recall any of the events described by bystanders. Patient is unable to give a history of past medical problems with the exception of prostate cancer remotely.  1. Altered mental status - given the patient's concerning story per bystanders, unclear history and information about patient's current medical condition  -Patient mentating well however not the best historian. Unable to reach any family members.  He mentions he was admitted to [facility] in [place] in the past. Requesting records.  2. Hypercalcemia. Head CT suggests concern for metastatic disease given sclerotic foci in the skull. Given history of prostate cancer this may indicate a recurrence. - We will check an intact PTH, phosphorus and vitamin D level.  -Oncology consulted. Outpatient follow-up advised  3. Leukopenia and thrombocytopenia No baseline for comparison Oncology on board. Monitor. No bleeding or signs of infection.  4. Schizophrenia We'll consult psychiatry.  Case discussed with Care Management/Social Worker.  CODE STATUS: Full  DVT Prophylaxis: SCD  TOTAL TIME TAKING CARE OF THIS PATIENT: 35 minutes.   POSSIBLE D/C IN 1-2 DAYS, DEPENDING ON CLINICAL CONDITION.  Note: This dictation was prepared with Dragon dictation along with smaller phrase technology. Any transcriptional  errors that result from this process are unintentional.  Hillary Bow R M.D on 10/26/2016 at 12:11 PM  Between 7am to 6pm - Pager - [phone]  After 6pm go to [email or web address] - password [email or web address] [facility]  [facility]  Office  [phone]  CC: Primary care physician; No PCP Per Patient

## 2016-10-27 LAB — ANTINUCLEAR ANTIBODIES, IFA: ANA Ab, IFA: NEGATIVE

## 2016-10-27 LAB — KAPPA/LAMBDA LIGHT CHAINS
Kappa free light chain: 15.7 mg/L (ref 3.3–19.4)
Kappa, lambda light chain ratio: 1.09 (ref 0.26–1.65)
Lambda free light chains: 14.4 mg/L (ref 5.7–26.3)

## 2016-10-27 LAB — VITAMIN D 25 HYDROXY (VIT D DEFICIENCY, FRACTURES): Vit D, 25-Hydroxy: 23.5 ng/mL — ABNORMAL LOW (ref 30.0–100.0)

## 2016-10-27 LAB — VALPROIC ACID LEVEL: Valproic Acid Lvl: 13 ug/mL — ABNORMAL LOW (ref 50.0–100.0)

## 2016-10-27 NOTE — Progress Notes (Signed)
Primary Nurse was rounding on Pt. At 2208 pt was found to be very letahrgic, not responding to commands at times. Rapid Response was called on pt. Primary Nurse paged Dr Pollyann Samples and spoke to in regard to pt. Orders received for STAT labs and CT of head. Dr. Pollyann Samples rounded on pt. Pt VSS, FSBS 116.  After pt returned from CT. Pt seemed to instantly become more alert. Requesting food to eat. Following all commands. Neuro Checks WDL. Primary nurse to continue to monitor pt

## 2016-10-27 NOTE — Progress Notes (Deleted)
[place] PG to ED for RR. PT was receiving chest compressions when [facility] arrived. PT family was moved to waiting room and [facility] helped staff with family. Support provided. PT moved to ICU.    CB to ICU. PT coded but was revived. [place] remained in the room until activity settled down. Checked in with nurse station to inquire about family. Family as in route. Nurse to page if [facility] needed with family.

## 2016-10-27 NOTE — Progress Notes (Signed)
Patient ambulated around the nurses station one time with nursing student, no acute distress noted.
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
## 2016-10-27 NOTE — Progress Notes (Signed)
Ch followed up per request of on-call [place] with Pt in room 205. Before meeting the Pt, [place] contacted the nurse about the involvement of Social Services with this Pt. Nurse confirmed that they were involved with this case. Pt was sitting up in a chair and being attended to by the nurse. Pt did not indicate a need for spiritual care at this time. CH is available for follow up as needed.    10/27/16 1000  Clinical Encounter Type  Visited With Patient;Health care provider  Visit Type Initial;Spiritual support;Social support  Referral From [employer]

## 2016-10-27 NOTE — Progress Notes (Signed)
Patient in chair with safety alarm set, watching TV no acute distress noted.

## 2016-10-27 NOTE — Progress Notes (Signed)
[facility] NAME: Edvardo Seminara    MR#:  [id number]  DATE OF BIRTH:  Apr 13, 1941  SUBJECTIVE: Alert, oriented. Response was called last admitted because of lethargy,. reportedly all the labs, vitals are stable at CT unremarkable. Started on Depakote, Zyprexa by psychiatry., patient is here with a these medications at 3 PM yesterday.   Came in after he was found wandering around the gas station has had multiple falls. No concerns today.  REVIEW OF SYSTEMS:   Review of Systems  Constitutional: Negative for chills and fever.  HENT: Negative for hearing loss.   Eyes: Negative for blurred vision, double vision and photophobia.  Respiratory: Negative for cough, hemoptysis and shortness of breath.   Cardiovascular: Negative for palpitations, orthopnea and leg swelling.  Gastrointestinal: Negative for abdominal pain, diarrhea and vomiting.  Genitourinary: Negative for dysuria and urgency.  Musculoskeletal: Negative for myalgias and neck pain.  Skin: Negative for rash.  Neurological: Negative for dizziness, focal weakness, seizures, weakness and headaches.  Psychiatric/Behavioral: Negative for memory loss. The patient does not have insomnia.    Tolerating Diet: Yes Tolerating PT: Pending  DRUG ALLERGIES:  No Known Allergies  VITALS:  Blood pressure 122/75, pulse 68, temperature 97.8 F (36.6 C), temperature source Oral, resp. rate 20, height 5\' 11"  (1.803 m), weight 108.3 kg (238 lb 12.8 oz), SpO2 97 %.  PHYSICAL EXAMINATION:   Physical Exam  GENERAL:  75 y.o.-year-old patient lying in the bed with no acute distress. Morbidly obese EYES: Pupils equal, round, reactive to light and accommodation. No scleral icterus. Extraocular muscles intact.  HEENT: Head atraumatic, normocephalic. Oropharynx and nasopharynx clear.  NECK:  Supple, no jugular venous distention. No thyroid enlargement, no tenderness.  LUNGS: Normal breath  sounds bilaterally, no wheezing, rales, rhonchi. No use of accessory muscles of respiration.  CARDIOVASCULAR: S1, S2 normal. No murmurs, rubs, or gallops.  ABDOMEN: Soft, nontender, nondistended. Bowel sounds present. No organomegaly or mass.  EXTREMITIES: No cyanosis, clubbing or edema b/l.    NEUROLOGIC: Cranial nerves II through XII are intact. No focal Motor or sensory deficits b/l.   PSYCHIATRIC:  patient is alert and Awake. Oriented to place but not time or person. SKIN: No obvious rash, lesion, or ulcer.   LABORATORY PANEL:  CBC  Recent Labs Lab 10/26/16 2230  WBC 3.2*  HGB 14.2  HCT 43.8  PLT 88*    Chemistries   Recent Labs Lab 10/24/16 2248  10/26/16 2230  NA  --   < > 140  K  --   < > 4.3  CL  --   < > 109  CO2  --   < > 26  GLUCOSE  --   < > 95  BUN  --   < > 13  CREATININE 0.79  < > 0.77  CALCIUM  --   < > 10.9*  MG 2.2  --   --   AST  --   --  55*  ALT  --   --  75*  ALKPHOS  --   --  62  BILITOT  --   --  0.7  < > = values in this interval not displayed. Cardiac Enzymes  Recent Labs Lab 10/25/16 1107  TROPONINI <0.03   RADIOLOGY:  Ct Head Wo Contrast  Result Date: 10/26/2016 CLINICAL DATA:  Lethargy.  History of prostate cancer. EXAM: CT HEAD WITHOUT CONTRAST TECHNIQUE: Contiguous axial images were obtained from the base  of the skull through the vertex without intravenous contrast. COMPARISON:  None. FINDINGS: Brain: No mass lesion, intraparenchymal hemorrhage or extra-axial collection. No evidence of acute cortical infarct. Brain parenchyma and CSF-containing spaces are normal for age. Vascular: No hyperdense vessel or unexpected calcification. Skull: Normal visualized skull base, calvarium and extracranial soft tissues. Sinuses/Orbits: No sinus fluid levels or advanced mucosal thickening. No mastoid effusion. Normal orbits. IMPRESSION: 1. Normal brain for age. 2. Re- demonstration of scattered sclerotic foci of the skull. If there is concern for  osseous metastatic disease, nuclear medicine bone scan may be helpful. Electronically Signed   By: Ulyses Jarred M.D.   On: 10/26/2016 22:51   ASSESSMENT AND PLAN:  Mattheo Freehill is a 75 y.o. male with unknown PMH presents to the emergency department by EMSFor altered mental status. Patient reportedly lives at a retirement home in [place]. Per emergency department records patient was walking to a gas station when bystanders found him to have unsteady gait, frequent falls, appeared to be confused, diaphoretic and weak. Patient recalls going to the gas station but does not recall any of the events described by bystanders. Patient is unable to give a history of past medical problems with the exception of prostate cancer remotely.  1. Altered mental status - given the patient's concerning story per bystanders, unclear history and information about patient's current medical condition  -He is alert, awake, oriented this time. He is from a group home.  2. Hypercalcemia. Head CT suggests concern for metastatic disease given sclerotic foci in the skull. Given history of prostate cancer this may indicate a recurrence. - We will check an intact PTH, phosphorus and vitamin D level.  -Oncology consulted. Outpatient follow-up advised  3. Leukopenia and thrombocytopenia No baseline for comparison Oncology on board. Monitor. No bleeding or signs of infection. No prior labs for baseline.  Check CBC with diff, B12, folate, TSH, ANA with reflex, SPEP, free light chains, immunoglobulins.  Peripheral smear for path review.  4. Schizophrenia; started Depakote, olanzapine by psychiatry., patient was lethargic last night, rapid response was called.would observe him tonight also to make sure he is  stablebefore we discharge him tomorrow. Discussed with patient's registered nurse.  Case discussed with Care Management/Social Worker.  CODE STATUS: Full  DVT Prophylaxis: SCD  TOTAL TIME TAKING CARE OF THIS  PATIENT: 35 minutes.   POSSIBLE D/C IN 1-2 DAYS, DEPENDING ON CLINICAL CONDITION.  Note: This dictation was prepared with Dragon dictation along with smaller phrase technology. Any transcriptional errors that result from this process are unintentional.  Katelee Schupp M.D on 10/27/2016 at 11:34 AM  Between 7am to 6pm - Pager - [phone]  After 6pm go to [email or web address] - password [email or web address] [facility]  [facility]  Office  [phone]  CC: Primary care physician; No PCP Per Patient

## 2016-10-27 NOTE — Clinical Social Work Note (Signed)
CSW informed by Thayer Headings at [facility]: [phone] that they follow patient and that patient also has a legal guardian. CSW contacted [facility] and was informed that patient has a guardian from [place]: [phone] or [phone]. CSW contacted Mr. Joanell Rising and he is going to send over the guardianship paperwork to place on patient's chart. CSW notified Mr. Joanell Rising that the physician has stated patient may discharge back to his group home tomorrow. CSW has also contacted Lavada Mesi (owner of the care home) and notified her that patient may discharge tomorrow. Shela Leff MSW,LCSW [phone]

## 2016-10-27 NOTE — Progress Notes (Signed)
CH PG RR. Patient was showing processing abnormalities and trouble breathing. CH had visited with PT earlier on rounds and was able to fill in brief family information to providers. CH was present until PT moved for tests. Available as needed.

## 2016-10-27 NOTE — Progress Notes (Signed)
Pt alert and sitting in recline. Pt hoping to be discharged but has not been told.  CH offered prayer.   10/27/16 1100  Clinical Encounter Type  Visited With Patient  Visit Type Follow-up  Referral From Chaplain  Spiritual Encounters  Spiritual Needs Prayer;Emotional  Stress Factors  Patient Stress Factors None identified

## 2016-10-28 LAB — MULTIPLE MYELOMA PANEL, SERUM
Albumin SerPl Elph-Mcnc: 3.4 g/dL (ref 2.9–4.4)
Albumin/Glob SerPl: 1.3 (ref 0.7–1.7)
Alpha 1: 0.2 g/dL (ref 0.0–0.4)
Alpha2 Glob SerPl Elph-Mcnc: 0.6 g/dL (ref 0.4–1.0)
B-Globulin SerPl Elph-Mcnc: 1 g/dL (ref 0.7–1.3)
Gamma Glob SerPl Elph-Mcnc: 1 g/dL (ref 0.4–1.8)
Globulin, Total: 2.7 g/dL (ref 2.2–3.9)
IgA: 334 mg/dL (ref 61–437)
IgG (Immunoglobin G), Serum: 986 mg/dL (ref 700–1600)
IgM, Serum: 57 mg/dL (ref 15–143)
Total Protein ELP: 6.1 g/dL (ref 6.0–8.5)

## 2016-10-28 LAB — BLOOD GAS, ARTERIAL
ACID-BASE EXCESS: 4.8 mmol/L — AB (ref 0.0–2.0)
BICARBONATE: 29.9 mmol/L — AB (ref 20.0–28.0)
FIO2: 0.21
O2 Saturation: 90.9 %
PATIENT TEMPERATURE: 37
PH ART: 7.43 (ref 7.350–7.450)
PO2 ART: 59 mmHg — AB (ref 83.0–108.0)
pCO2 arterial: 45 mmHg (ref 32.0–48.0)

## 2016-10-28 MED ORDER — AMLODIPINE BESYLATE 5 MG PO TABS: 5.0000 mg | ORAL_TABLET | Freq: Every day | ORAL | Status: DC

## 2016-10-28 NOTE — Clinical Social Work Note (Addendum)
MSW spoke to [place] at [phone] regarding patient discharging back to group home.  Tommie Sams said they will try to pick patient up as soon as they can.  MSW updated bedside nurse, patient to be d/c'ed today to [facility] patient and family agreeable to plans will transport via facility transportation.  Evette Cristal, MSW Mon-Fri 8a-4:30p [phone]

## 2016-10-28 NOTE — Progress Notes (Signed)
Patient discharge teaching given and went over with Henry Smith, with transportation to group home, including activity, diet, follow-up appoints, and medications. Patient and Henry Smith verbalized understanding of all discharge instructions. IV access was d/c'd. Vitals are stable. Skin is intact except as charted in most recent assessments. Pt to be escorted out by Henry Smith, to be driven to group home.   Kaycen Whitworth [employer]
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
## 2016-10-28 NOTE — Care Management Important Message (Signed)
Important Message  Patient Details  Name: Laurent Braxton MRN: [id number] Date of Birth: 16-Jun-1941   Medicare Important Message Given:  Yes    Beverly Sessions, RN 10/28/2016, 10:07 AM

## 2016-10-29 LAB — PATHOLOGIST SMEAR REVIEW

## 2016-10-30 ENCOUNTER — Inpatient Hospital Stay: Payer: Medicare Other | Attending: Hematology and Oncology | Admitting: Hematology and Oncology

## 2016-10-30 ENCOUNTER — Inpatient Hospital Stay: Payer: Medicare Other

## 2016-10-30 VITALS — BP 112/74 | HR 84 | Temp 96.2°F | Resp 18 | Ht 70.47 in | Wt 236.0 lb

## 2016-10-30 DIAGNOSIS — F209 Schizophrenia, unspecified: Secondary | ICD-10-CM | POA: Diagnosis not present

## 2016-10-30 DIAGNOSIS — D649 Anemia, unspecified: Secondary | ICD-10-CM | POA: Diagnosis not present

## 2016-10-30 DIAGNOSIS — F17210 Nicotine dependence, cigarettes, uncomplicated: Secondary | ICD-10-CM | POA: Diagnosis not present

## 2016-10-30 DIAGNOSIS — D72819 Decreased white blood cell count, unspecified: Secondary | ICD-10-CM

## 2016-10-30 DIAGNOSIS — D696 Thrombocytopenia, unspecified: Secondary | ICD-10-CM | POA: Insufficient documentation

## 2016-10-30 DIAGNOSIS — Z923 Personal history of irradiation: Secondary | ICD-10-CM | POA: Insufficient documentation

## 2016-10-30 DIAGNOSIS — Z79899 Other long term (current) drug therapy: Secondary | ICD-10-CM | POA: Insufficient documentation

## 2016-10-30 DIAGNOSIS — C61 Malignant neoplasm of prostate: Secondary | ICD-10-CM | POA: Insufficient documentation

## 2016-10-30 DIAGNOSIS — M899 Disorder of bone, unspecified: Secondary | ICD-10-CM

## 2016-10-30 LAB — CBC WITH DIFFERENTIAL/PLATELET
Basophils Absolute: 0 10*3/uL (ref 0–0.1)
Basophils Relative: 1 %
Eosinophils Absolute: 0 10*3/uL (ref 0–0.7)
Eosinophils Relative: 1 %
HCT: 45.6 % (ref 40.0–52.0)
Hemoglobin: 14.9 g/dL (ref 13.0–18.0)
Lymphocytes Relative: 33 %
Lymphs Abs: 1.4 10*3/uL (ref 1.0–3.6)
MCH: 28.3 pg (ref 26.0–34.0)
MCHC: 32.7 g/dL (ref 32.0–36.0)
MCV: 86.4 fL (ref 80.0–100.0)
Monocytes Absolute: 0.4 10*3/uL (ref 0.2–1.0)
Monocytes Relative: 10 %
Neutro Abs: 2.4 10*3/uL (ref 1.4–6.5)
Neutrophils Relative %: 55 %
Platelets: 113 10*3/uL — ABNORMAL LOW (ref 150–440)
RBC: 5.27 MIL/uL (ref 4.40–5.90)
RDW: 14.8 % — ABNORMAL HIGH (ref 11.5–14.5)
WBC: 4.3 10*3/uL (ref 3.8–10.6)
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
## 2016-10-30 NOTE — Progress Notes (Signed)
Follow up after hospitalization, prostate cancer. Patient reports infection between toes, appears to be open skin between toes on right foot with small amount of clear/white drainage. Pt denies pain.  Patient lives at family care home, [facility].

## 2016-10-31 LAB — HEPATITIS B SURFACE ANTIGEN: Hepatitis B Surface Ag: NEGATIVE

## 2016-10-31 LAB — HIV ANTIBODY (ROUTINE TESTING W REFLEX): HIV Screen 4th Generation wRfx: NONREACTIVE

## 2016-10-31 LAB — HEPATITIS C ANTIBODY: HCV Ab: <0.1 s/co ratio (ref 0.0–0.9)

## 2016-10-31 LAB — HEPATITIS B CORE ANTIBODY, TOTAL: Hep B Core Total Ab: NEGATIVE

## 2016-11-01 ENCOUNTER — Encounter: Payer: Self-pay | Admitting: Hematology and Oncology

## 2016-11-18 ENCOUNTER — Ambulatory Visit
Admission: RE | Admit: 2016-11-18 | Discharge: 2016-11-18 | Disposition: A | Discharge status: Home / Self Care | Payer: Medicare Other | Source: Ambulatory Visit | Attending: Hematology and Oncology | Admitting: Hematology and Oncology

## 2016-11-18 DIAGNOSIS — R937 Abnormal findings on diagnostic imaging of other parts of musculoskeletal system: Secondary | ICD-10-CM | POA: Insufficient documentation

## 2016-11-18 DIAGNOSIS — M899 Disorder of bone, unspecified: Secondary | ICD-10-CM

## 2016-11-18 DIAGNOSIS — D696 Thrombocytopenia, unspecified: Secondary | ICD-10-CM

## 2016-11-18 DIAGNOSIS — C61 Malignant neoplasm of prostate: Secondary | ICD-10-CM

## 2016-11-18 HISTORY — DX: Malignant (primary) neoplasm, unspecified: C80.1

## 2016-11-18 MED ORDER — TECHNETIUM TC 99M MEDRONATE IV KIT
22.0000 | PACK | Freq: Once | INTRAVENOUS | Status: AC | PRN
  Administered 2016-11-18: 21.051 via INTRAVENOUS

## 2016-11-20 ENCOUNTER — Other Ambulatory Visit: Payer: Self-pay | Admitting: *Deleted

## 2016-11-20 DIAGNOSIS — C61 Malignant neoplasm of prostate: Secondary | ICD-10-CM

## 2016-11-20 DIAGNOSIS — R948 Abnormal results of function studies of other organs and systems: Secondary | ICD-10-CM
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
## 2016-11-23 ENCOUNTER — Ambulatory Visit
Admission: RE | Admit: 2016-11-23 | Discharge: 2016-11-23 | Disposition: A | Discharge status: Home / Self Care | Payer: Medicare Other | Source: Ambulatory Visit | Attending: Hematology and Oncology | Admitting: Hematology and Oncology

## 2016-11-23 ENCOUNTER — Inpatient Hospital Stay: Payer: Medicare Other | Attending: Hematology and Oncology | Admitting: Hematology and Oncology

## 2016-11-23 VITALS — BP 133/87 | HR 90 | Temp 97.1°F | Resp 18 | Wt 236.3 lb

## 2016-11-23 DIAGNOSIS — D696 Thrombocytopenia, unspecified: Secondary | ICD-10-CM | POA: Insufficient documentation

## 2016-11-23 DIAGNOSIS — F209 Schizophrenia, unspecified: Secondary | ICD-10-CM | POA: Insufficient documentation

## 2016-11-23 DIAGNOSIS — D72819 Decreased white blood cell count, unspecified: Secondary | ICD-10-CM | POA: Insufficient documentation

## 2016-11-23 DIAGNOSIS — F17210 Nicotine dependence, cigarettes, uncomplicated: Secondary | ICD-10-CM | POA: Diagnosis not present

## 2016-11-23 DIAGNOSIS — C61 Malignant neoplasm of prostate: Secondary | ICD-10-CM | POA: Insufficient documentation

## 2016-11-23 DIAGNOSIS — Z79899 Other long term (current) drug therapy: Secondary | ICD-10-CM

## 2016-11-23 DIAGNOSIS — R948 Abnormal results of function studies of other organs and systems: Secondary | ICD-10-CM

## 2016-11-23 DIAGNOSIS — D649 Anemia, unspecified: Secondary | ICD-10-CM

## 2016-11-23 DIAGNOSIS — S2243XA Multiple fractures of ribs, bilateral, initial encounter for closed fracture: Secondary | ICD-10-CM | POA: Insufficient documentation

## 2016-11-23 DIAGNOSIS — Z923 Personal history of irradiation: Secondary | ICD-10-CM | POA: Insufficient documentation

## 2016-11-23 NOTE — Progress Notes (Signed)
Patient here today for Bone Scan and Abdominal US results.  Patient will need rib x-ray after appointment.

## 2016-11-30 ENCOUNTER — Ambulatory Visit
Admission: RE | Admit: 2016-11-30 | Discharge: 2016-11-30 | Disposition: A | Discharge status: Home / Self Care | Payer: Medicare Other | Source: Ambulatory Visit | Attending: Hematology and Oncology | Admitting: Hematology and Oncology

## 2016-11-30 DIAGNOSIS — K029 Dental caries, unspecified: Secondary | ICD-10-CM | POA: Insufficient documentation

## 2016-11-30 DIAGNOSIS — M2669 Other specified disorders of temporomandibular joint: Secondary | ICD-10-CM | POA: Insufficient documentation

## 2016-11-30 DIAGNOSIS — R948 Abnormal results of function studies of other organs and systems: Secondary | ICD-10-CM

## 2016-12-06 ENCOUNTER — Encounter: Payer: Self-pay | Admitting: Emergency Medicine

## 2016-12-06 ENCOUNTER — Emergency Department: Payer: Medicare Other

## 2016-12-06 ENCOUNTER — Inpatient Hospital Stay
Admission: EM | Admit: 2016-12-06 | Discharge: 2016-12-08 | DRG: 640 | Disposition: A | Discharge status: Home Health | Payer: Medicare Other | Attending: Internal Medicine | Admitting: Internal Medicine

## 2016-12-06 DIAGNOSIS — R296 Repeated falls: Secondary | ICD-10-CM | POA: Diagnosis present

## 2016-12-06 DIAGNOSIS — R2681 Unsteadiness on feet: Secondary | ICD-10-CM

## 2016-12-06 DIAGNOSIS — Z79899 Other long term (current) drug therapy: Secondary | ICD-10-CM

## 2016-12-06 DIAGNOSIS — G40909 Epilepsy, unspecified, not intractable, without status epilepticus: Secondary | ICD-10-CM | POA: Diagnosis present

## 2016-12-06 DIAGNOSIS — E559 Vitamin D deficiency, unspecified: Secondary | ICD-10-CM | POA: Diagnosis present

## 2016-12-06 DIAGNOSIS — E876 Hypokalemia: Secondary | ICD-10-CM | POA: Diagnosis present

## 2016-12-06 DIAGNOSIS — F17210 Nicotine dependence, cigarettes, uncomplicated: Secondary | ICD-10-CM | POA: Diagnosis present

## 2016-12-06 DIAGNOSIS — Z8546 Personal history of malignant neoplasm of prostate: Secondary | ICD-10-CM | POA: Diagnosis not present

## 2016-12-06 DIAGNOSIS — F209 Schizophrenia, unspecified: Secondary | ICD-10-CM | POA: Diagnosis present

## 2016-12-06 DIAGNOSIS — J189 Pneumonia, unspecified organism: Secondary | ICD-10-CM | POA: Diagnosis present

## 2016-12-06 LAB — COMPREHENSIVE METABOLIC PANEL
ALK PHOS: 71 U/L (ref 38–126)
ALT: 50 U/L (ref 17–63)
ANION GAP: 2 — AB (ref 5–15)
AST: 39 U/L (ref 15–41)
Albumin: 3.6 g/dL (ref 3.5–5.0)
BILIRUBIN TOTAL: 0.5 mg/dL (ref 0.3–1.2)
BUN: 11 mg/dL (ref 6–20)
CALCIUM: 11.8 mg/dL — AB (ref 8.9–10.3)
CO2: 34 mmol/L — AB (ref 22–32)
Chloride: 109 mmol/L (ref 101–111)
Creatinine, Ser: 0.9 mg/dL (ref 0.61–1.24)
GFR calc non Af Amer: >60 mL/min (ref >60)
Glucose, Bld: 97 mg/dL (ref 65–99)
Potassium: 4.1 mmol/L (ref 3.5–5.1)
SODIUM: 145 mmol/L (ref 135–145)
TOTAL PROTEIN: 6.7 g/dL (ref 6.5–8.1)

## 2016-12-06 LAB — URINALYSIS, COMPLETE (UACMP) WITH MICROSCOPIC
BACTERIA UA: NONE SEEN
Bilirubin Urine: NEGATIVE
Glucose, UA: NEGATIVE mg/dL
HGB URINE DIPSTICK: NEGATIVE
Ketones, ur: NEGATIVE mg/dL
NITRITE: NEGATIVE
Protein, ur: NEGATIVE mg/dL
SPECIFIC GRAVITY, URINE: 1.015 (ref 1.005–1.030)
pH: 7 (ref 5.0–8.0)

## 2016-12-06 LAB — CBC WITH DIFFERENTIAL/PLATELET
Basophils Absolute: 0 10*3/uL (ref 0–0.1)
Basophils Relative: 1 %
EOS ABS: 0 10*3/uL (ref 0–0.7)
Eosinophils Relative: 1 %
HCT: 43.2 % (ref 40.0–52.0)
HEMOGLOBIN: 14.1 g/dL (ref 13.0–18.0)
LYMPHS ABS: 0.9 10*3/uL — AB (ref 1.0–3.6)
Lymphocytes Relative: 28 %
MCH: 28.5 pg (ref 26.0–34.0)
MCHC: 32.7 g/dL (ref 32.0–36.0)
MCV: 87.2 fL (ref 80.0–100.0)
MONO ABS: 0.5 10*3/uL (ref 0.2–1.0)
MONOS PCT: 16 %
NEUTROS PCT: 54 %
Neutro Abs: 1.7 10*3/uL (ref 1.4–6.5)
Platelets: 103 10*3/uL — ABNORMAL LOW (ref 150–440)
RBC: 4.96 MIL/uL (ref 4.40–5.90)
RDW: 15.3 % — ABNORMAL HIGH (ref 11.5–14.5)
WBC: 3 10*3/uL — ABNORMAL LOW (ref 3.8–10.6)

## 2016-12-06 LAB — LIPASE, BLOOD: Lipase: 24 U/L (ref 11–51)

## 2016-12-06 LAB — VALPROIC ACID LEVEL: Valproic Acid Lvl: 18 ug/mL — ABNORMAL LOW (ref 50.0–100.0)

## 2016-12-06 LAB — MRSA PCR SCREENING: MRSA BY PCR: NEGATIVE

## 2016-12-06 LAB — TSH: TSH: 1.708 u[IU]/mL (ref 0.350–4.500)

## 2016-12-06 LAB — PROCALCITONIN: Procalcitonin: <0.1 ng/mL

## 2016-12-06 LAB — TROPONIN I

## 2016-12-06 MED ORDER — VANCOMYCIN HCL IN DEXTROSE 1-5 GM/200ML-% IV SOLN
1000.0000 mg | Freq: Once | INTRAVENOUS | Status: AC
  Administered 2016-12-06: 1000 mg via INTRAVENOUS
  Filled 2016-12-06: qty 200

## 2016-12-06 MED ORDER — ACETAMINOPHEN 650 MG RE SUPP: 650.0000 mg | Freq: Four times a day (QID) | RECTAL | Status: DC | PRN

## 2016-12-06 MED ORDER — ENOXAPARIN SODIUM 40 MG/0.4ML ~~LOC~~ SOLN
40.0000 mg | SUBCUTANEOUS | Status: DC
  Administered 2016-12-06 – 2016-12-07 (×2): 40 mg via SUBCUTANEOUS
  Filled 2016-12-06 (×2): qty 0.4

## 2016-12-06 MED ORDER — DIVALPROEX SODIUM 250 MG PO DR TAB
250.0000 mg | DELAYED_RELEASE_TABLET | Freq: Three times a day (TID) | ORAL | Status: DC
  Administered 2016-12-06 – 2016-12-08 (×6): 250 mg via ORAL
  Filled 2016-12-06 (×6): qty 1

## 2016-12-06 MED ORDER — OXYCODONE HCL 5 MG PO TABS: 5.0000 mg | ORAL_TABLET | ORAL | Status: DC | PRN

## 2016-12-06 MED ORDER — GABAPENTIN 300 MG PO CAPS
300.0000 mg | ORAL_CAPSULE | Freq: Three times a day (TID) | ORAL | Status: DC
  Administered 2016-12-06 – 2016-12-08 (×6): 300 mg via ORAL
  Filled 2016-12-06 (×6): qty 1

## 2016-12-06 MED ORDER — OLANZAPINE 10 MG PO TABS
10.0000 mg | ORAL_TABLET | Freq: Two times a day (BID) | ORAL | Status: DC
  Administered 2016-12-06 – 2016-12-08 (×4): 10 mg via ORAL
  Filled 2016-12-06 (×4): qty 1

## 2016-12-06 MED ORDER — ONDANSETRON HCL 4 MG/2ML IJ SOLN: 4.0000 mg | Freq: Four times a day (QID) | INTRAMUSCULAR | Status: DC | PRN

## 2016-12-06 MED ORDER — ACETAMINOPHEN 325 MG PO TABS: 650.0000 mg | ORAL_TABLET | Freq: Four times a day (QID) | ORAL | Status: DC | PRN

## 2016-12-06 MED ORDER — SODIUM CHLORIDE 0.9% FLUSH
3.0000 mL | Freq: Two times a day (BID) | INTRAVENOUS | Status: DC
  Administered 2016-12-06 – 2016-12-07 (×2): 3 mL via INTRAVENOUS

## 2016-12-06 MED ORDER — ONDANSETRON HCL 4 MG PO TABS: 4.0000 mg | ORAL_TABLET | Freq: Four times a day (QID) | ORAL | Status: DC | PRN

## 2016-12-06 MED ORDER — VITAMIN D 1000 UNITS PO TABS
4000.0000 [IU] | ORAL_TABLET | Freq: Every day | ORAL | Status: DC
  Administered 2016-12-07 – 2016-12-08 (×2): 4000 [IU] via ORAL
  Filled 2016-12-06 (×2): qty 4

## 2016-12-06 MED ORDER — CEFEPIME-DEXTROSE 2 GM/50ML IV SOLR
2.0000 g | Freq: Once | INTRAVENOUS | Status: AC
  Administered 2016-12-06: 2 g via INTRAVENOUS
  Filled 2016-12-06: qty 50

## 2016-12-06 MED ORDER — DIVALPROEX SODIUM ER 250 MG PO TB24
1250.0000 mg | ORAL_TABLET | Freq: Every day | ORAL | Status: DC
  Administered 2016-12-06 – 2016-12-07 (×2): 1250 mg via ORAL
  Filled 2016-12-06: qty 1
  Filled 2016-12-06 (×2): qty 2

## 2016-12-06 MED ORDER — SODIUM CHLORIDE 0.9 % IV SOLN
INTRAVENOUS | Status: DC
  Administered 2016-12-06 – 2016-12-07 (×2): via INTRAVENOUS

## 2016-12-06 NOTE — ED Notes (Signed)
Pt presents to ED via [place] from [facility]. EMS reports they were called out for AMS but pt does not appear altered to them. EMS state pt did have a fall yesterday and is currently complaining of back pain. VS WNL, CBG PTA 202. NSR on EMS monitor. Pt was ambulatory to stretcher.

## 2016-12-06 NOTE — ED Notes (Signed)
EDP spoke to [facility] Asst. Living [phone]) staff who state pt has developed gait disturbances, frequent falls, and periodic confusion over the last few days. Staff report the last time pt had these symptoms he was diagnosed with hypercalcemia.
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
## 2016-12-07 LAB — BASIC METABOLIC PANEL
ANION GAP: 3 — AB (ref 5–15)
BUN: 13 mg/dL (ref 6–20)
CALCIUM: 10.9 mg/dL — AB (ref 8.9–10.3)
CO2: 30 mmol/L (ref 22–32)
CREATININE: 0.74 mg/dL (ref 0.61–1.24)
Chloride: 112 mmol/L — ABNORMAL HIGH (ref 101–111)
Glucose, Bld: 92 mg/dL (ref 65–99)
Potassium: 4.1 mmol/L (ref 3.5–5.1)
Sodium: 145 mmol/L (ref 135–145)

## 2016-12-07 LAB — CBC
HCT: 40.4 % (ref 40.0–52.0)
HEMOGLOBIN: 13.3 g/dL (ref 13.0–18.0)
MCH: 28.7 pg (ref 26.0–34.0)
MCHC: 32.9 g/dL (ref 32.0–36.0)
MCV: 87.1 fL (ref 80.0–100.0)
PLATELETS: 104 10*3/uL — AB (ref 150–440)
RBC: 4.64 MIL/uL (ref 4.40–5.90)
RDW: 15 % — ABNORMAL HIGH (ref 11.5–14.5)
WBC: 2.7 10*3/uL — ABNORMAL LOW (ref 3.8–10.6)

## 2016-12-07 MED ORDER — LEVOFLOXACIN 500 MG PO TABS
500.0000 mg | ORAL_TABLET | Freq: Every day | ORAL | Status: DC
  Administered 2016-12-07 – 2016-12-08 (×2): 500 mg via ORAL
  Filled 2016-12-07 (×2): qty 1

## 2016-12-07 MED ORDER — SODIUM CHLORIDE 0.45 % IV SOLN
INTRAVENOUS | Status: DC
  Administered 2016-12-07 (×2): via INTRAVENOUS
  Administered 2016-12-08: 100 mL/h via INTRAVENOUS

## 2016-12-07 NOTE — Progress Notes (Signed)
[place] at [place] NAME: Henry Smith    MR#:  [id number]  DATE OF BIRTH:  22-Oct-1941  SUBJECTIVE:  CHIEF COMPLAINT:   Chief Complaint  Patient presents with  . Back Pain    Weakness REVIEW OF SYSTEMS:  Review of Systems  Constitutional: Positive for malaise/fatigue. Negative for chills and fever.  HENT: Negative for congestion.   Eyes: Negative for blurred vision and double vision.  Respiratory: Negative for cough, shortness of breath and stridor.   Cardiovascular: Negative for chest pain and leg swelling.  Gastrointestinal: Negative for abdominal pain, diarrhea, nausea and vomiting.  Genitourinary: Negative for dysuria and hematuria.  Musculoskeletal: Negative for joint pain.  Skin: Negative for itching and rash.  Neurological: Positive for weakness. Negative for dizziness, focal weakness and loss of consciousness.  Psychiatric/Behavioral: Negative for depression. The patient is not nervous/anxious.     DRUG ALLERGIES:  No Known Allergies VITALS:  Blood pressure 132/73, pulse 64, temperature 97.6 F (36.4 C), temperature source Oral, resp. rate 18, height 5\' 10"  (1.778 m), weight 231 lb (104.8 kg), SpO2 92 %. PHYSICAL EXAMINATION:  Physical Exam  Constitutional: He is oriented to person, place, and time and well-developed, well-nourished, and in no distress.  HENT:  Head: Normocephalic.  Mouth/Throat: Oropharynx is clear and moist.  Eyes: Conjunctivae and EOM are normal.  Neck: Normal range of motion. Neck supple. No JVD present. No tracheal deviation present.  Cardiovascular: Normal rate, regular rhythm and normal heart sounds.  Exam reveals no gallop.   No murmur heard. Pulmonary/Chest: Effort normal and breath sounds normal. No respiratory distress. He has no wheezes.  Abdominal: Soft. Bowel sounds are normal. He exhibits no distension. There is no tenderness.  Musculoskeletal: Normal range of motion. He exhibits  no edema or tenderness.  Neurological: He is alert and oriented to person, place, and time. No cranial nerve deficit.  looks Demented.  Skin: Skin is warm and dry. No rash noted. No erythema.  Psychiatric: Affect normal.   LABORATORY PANEL:   CBC  Recent Labs Lab 12/07/16 0411  WBC 2.7*  HGB 13.3  HCT 40.4  PLT 104*   ------------------------------------------------------------------------------------------------------------------ Chemistries   Recent Labs Lab 12/06/16 1309 12/07/16 0411  NA 145 145  K 4.1 4.1  CL 109 112*  CO2 34* 30  GLUCOSE 97 92  BUN 11 13  CREATININE 0.90 0.74  CALCIUM 11.8* 10.9*  AST 39  --   ALT 50  --   ALKPHOS 71  --   BILITOT 0.5  --    RADIOLOGY:  Dg Chest 1 View  Result Date: 12/06/2016 CLINICAL DATA:  Status post fall a couple of times over the past 2 days. EXAM: CHEST 1 VIEW COMPARISON:  PA and lateral chest 10/24/2016. FINDINGS: Right basilar atelectasis is unchanged. Streaky airspace opacity is seen in the left mid and lower lung zones. Heart size is normal. Asymmetric elevation of the right hemidiaphragm relative to the left is unchanged. IMPRESSION: Patchy left basilar airspace disease worrisome for pneumonia. No change in atelectasis in the right lung base. Electronically Signed   By: Inge Rise M.D.   On: 12/06/2016 13:18   Ct Head Wo Contrast  Result Date: 12/06/2016 CLINICAL DATA:  Recurrent falls, poor historian, possible head injury EXAM: CT HEAD WITHOUT CONTRAST TECHNIQUE: Contiguous axial images were obtained from the base of the skull through the vertex without intravenous contrast. COMPARISON:  10/26/2016 FINDINGS: Brain: Stable brain atrophy and chronic  white matter microvascular ischemic changes throughout the cerebral hemispheres. No acute intracranial hemorrhage, mass lesion, new infarction, midline shift, herniation, hydrocephalus, or extra-axial fluid collection. Normal gray-white matter differentiation. Cisterns  are patent. Cerebellar atrophy as well. Vascular: None. Skull: No acute finding. Negative for fracture. Stable small scattered indeterminate sclerotic foci. Sinuses/Orbits: Unremarkable orbits. Small retention cyst or polyp measuring 7 mm in the right maxillary sinus, image 6. Sinuses and mastoid otherwise clear. Other: None. IMPRESSION: Stable brain atrophy and chronic white matter microvascular changes. No acute intracranial abnormality or interval change by noncontrast CT. Electronically Signed   By: Jerilynn Mages.  Shick M.D.   On: 12/06/2016 13:09   ASSESSMENT AND PLAN:   75 year old African-American gentleman history of prostate cancer who is presenting with multiple falls  1. Hypercalcemia: Improving. Continue IV fluid hydration, follow-up calcium level, vitamin D level, PTH, PTH related peptide  History of flow processes cancer. Recent abnormal bone scan. Follow-up oncologist as outpatient.  2. Frequent falls:  Physical therapy and fall precaution.  3. Vitamin D deficiency continue with cholecalciferol 4. Seizure disorder continue Depakote, low level. 5. X-ray findings concerning for pneumonia:  Discontinue cefepime. Changed to by mouth Levaquin.  Weakness and fall. PT evaluation. All the records are reviewed and case discussed with Care Management/Social Worker. Management plans discussed with the patient, family and they are in agreement.  CODE STATUS: Full code  TOTAL TIME TAKING CARE OF THIS PATIENT: 33 minutes.   More than 50% of the time was spent in counseling/coordination of care: YES  POSSIBLE D/C IN 2 DAYS, DEPENDING ON CLINICAL CONDITION.   Demetrios Loll M.D on 12/07/2016 at 12:06 PM  Between 7am to 6pm - Pager - [phone]  After 6pm go to [email or web address] - Proofreader  [facility]  Office  [phone]  CC: Primary care physician; Lorelee Market, MD  Note: This dictation was prepared with Dragon dictation along with smaller  phrase technology. Any transcriptional errors that result from this process are unintentional.

## 2016-12-08 LAB — PARATHYROID HORMONE, INTACT (NO CA): PTH: 72 pg/mL — ABNORMAL HIGH (ref 15–65)

## 2016-12-08 LAB — CALCIUM: CALCIUM: 10.6 mg/dL — AB (ref 8.9–10.3)

## 2016-12-08 LAB — VITAMIN D 25 HYDROXY (VIT D DEFICIENCY, FRACTURES): VIT D 25 HYDROXY: 42.8 ng/mL (ref 30.0–100.0)

## 2016-12-08 LAB — PROCALCITONIN: Procalcitonin: <0.1 ng/mL

## 2016-12-08 MED ORDER — LEVOFLOXACIN 500 MG PO TABS: 500.0000 mg | ORAL_TABLET | Freq: Every day | ORAL | 0 refills | Status: DC

## 2016-12-08 NOTE — Discharge Summary (Addendum)
[place] at [place] NAME: Henry Smith    MR#:  [id number]  DATE OF BIRTH:  20-Dec-1940  DATE OF ADMISSION:  12/06/2016   ADMITTING PHYSICIAN: Lytle Butte, MD  DATE OF DISCHARGE: 12/08/2016  PRIMARY CARE PHYSICIAN: Lorelee Market, MD   ADMISSION DIAGNOSIS:  Hypercalcemia [E83.52] HCAP (healthcare-associated pneumonia) [J18.9] DISCHARGE DIAGNOSIS:  Active Problems:   Hypercalcemia  SECONDARY DIAGNOSIS:   Past Medical History:  Diagnosis Date  . Cancer [facility])    Prostate cancer   HOSPITAL COURSE:  75 year old African-American gentleman history of prostate cancer who is presenting with multiple falls  1. Hypercalcemia: Improved with IV fluid hydration, follow up vitamin D level, PTH, PTH related peptide as outpatient.  History of prostate cancer. Recent abnormal bone scan. Follow-up oncologist as outpatient.  2. Frequent falls:  Physical therapy and fall precaution.  3. Vitamin D deficiency continue with cholecalciferol 4. Seizure disorder continue Depakote, low level. 5. X-ray findings concerning for pneumonia:  Discontinued cefepime. Changed to by mouth Levaquin.  Weakness and fall. PT evaluation: HHPT.  DISCHARGE CONDITIONS:  Stable, discharge to group home with HHPT today. CONSULTS OBTAINED:  Treatment Team:  Lytle Butte, MD DRUG ALLERGIES:  No Known Allergies DISCHARGE MEDICATIONS:   Allergies as of 12/08/2016   No Known Allergies     Medication List    TAKE these medications   divalproex 250 MG DR tablet Commonly known as:  DEPAKOTE Take 250 mg by mouth at bedtime. What changed:  Another medication with the same name was removed. Continue taking this medication, and follow the directions you see here.   gabapentin 300 MG capsule Commonly known as:  NEURONTIN Take 300 mg by mouth 3 (three) times daily.   levofloxacin 500 MG tablet Commonly known as:  LEVAQUIN Take 1 tablet (500 mg  total) by mouth daily.   Melatonin 5 MG Tabs Take 5 mg by mouth at bedtime.   Vitamin D3 2000 units capsule Take 4,000 Units by mouth.        DISCHARGE INSTRUCTIONS:  See AVS.  If you experience worsening of your admission symptoms, develop shortness of breath, life threatening emergency, suicidal or homicidal thoughts you must seek medical attention immediately by calling 911 or calling your MD immediately  if symptoms less severe.  You Must read complete instructions/literature along with all the possible adverse reactions/side effects for all the Medicines you take and that have been prescribed to you. Take any new Medicines after you have completely understood and accpet all the possible adverse reactions/side effects.   Please note  You were cared for by a hospitalist during your hospital stay. If you have any questions about your discharge medications or the care you received while you were in the hospital after you are discharged, you can call the unit and asked to speak with the hospitalist on call if the hospitalist that took care of you is not available. Once you are discharged, your primary care physician will handle any further medical issues. Please note that NO REFILLS for any discharge medications will be authorized once you are discharged, as it is imperative that you return to your primary care physician (or establish a relationship with a primary care physician if you do not have one) for your aftercare needs so that they can reassess your need for medications and monitor your lab values.    On the day of Discharge:  VITAL SIGNS:  Blood pressure (!) 136/94, pulse Marland Kitchen)  50, temperature 97.6 F (36.4 C), temperature source Oral, resp. rate 18, height 5\' 10"  (1.778 m), weight 231 lb (104.8 kg), SpO2 99 %. PHYSICAL EXAMINATION:  GENERAL:  75 y.o.-year-old patient lying in the bed with no acute distress. Obese. EYES: Pupils equal, round, reactive to light and accommodation.  No scleral icterus. Extraocular muscles intact.  HEENT: Head atraumatic, normocephalic. Oropharynx and nasopharynx clear.  NECK:  Supple, no jugular venous distention. No thyroid enlargement, no tenderness.  LUNGS: Normal breath sounds bilaterally, no wheezing, rales,rhonchi or crepitation. No use of accessory muscles of respiration.  CARDIOVASCULAR: S1, S2 normal. No murmurs, rubs, or gallops.  ABDOMEN: Soft, non-tender, non-distended. Bowel sounds present. No organomegaly or mass.  EXTREMITIES: No pedal edema, cyanosis, or clubbing.  NEUROLOGIC: Cranial nerves II through XII are intact. Muscle strength 5/5 in all extremities. Sensation intact. Gait not checked.  PSYCHIATRIC: The patient is alert and oriented x 3.  SKIN: No obvious rash, lesion, or ulcer.  DATA REVIEW:   CBC  Recent Labs Lab 12/07/16 0411  WBC 2.7*  HGB 13.3  HCT 40.4  PLT 104*    Chemistries   Recent Labs Lab 12/06/16 1309 12/07/16 0411 12/08/16 0504  NA 145 145  --   K 4.1 4.1  --   CL 109 112*  --   CO2 34* 30  --   GLUCOSE 97 92  --   BUN 11 13  --   CREATININE 0.90 0.74  --   CALCIUM 11.8* 10.9* 10.6*  AST 39  --   --   ALT 50  --   --   ALKPHOS 71  --   --   BILITOT 0.5  --   --      Microbiology Results  Results for orders placed or performed during the hospital encounter of 12/06/16  MRSA PCR Screening     Status: None   Collection Time: 12/06/16  4:32 PM  Result Value Ref Range Status   MRSA by PCR NEGATIVE NEGATIVE Final    Comment:        The GeneXpert MRSA Assay (FDA approved for NASAL specimens only), is one component of a comprehensive MRSA colonization surveillance program. It is not intended to diagnose MRSA infection nor to guide or monitor treatment for MRSA infections.     RADIOLOGY:  No results found.   Management plans discussed with the patient, family and they are in agreement.  CODE STATUS:     Code Status Orders        Start     Ordered   12/06/16  1509  Full code  Continuous     12/06/16 1509    Code Status History    Date Active Date Inactive Code Status Order ID Comments User Context   10/24/2016 10:34 PM 10/28/2016  6:07 PM Full Code [id number]  Harvie Bridge, DO Inpatient    Advance Directive Documentation   Flowsheet Row Most Recent Value  Type of Advance Directive  Healthcare Power of Attorney  Pre-existing out of facility DNR order (yellow form or pink MOST form)  No data  "MOST" Form in Place?  No data      TOTAL TIME TAKING CARE OF THIS PATIENT: 33 minutes.    Demetrios Loll M.D on 12/08/2016 at 11:54 AM  Between 7am to 6pm - Pager - [phone]  After 6pm go to [email or web address] - [facility]  Office  [phone]  CC: Primary care physician; Lorelee Market,  MD   Note: This dictation was prepared with Dragon dictation along with smaller phrase technology. Any transcriptional errors that result from this process are unintentional.

## 2016-12-08 NOTE — Clinical Social Work Note (Signed)
Clinical Social Work Assessment  Patient Details  Name: Henry Smith MRN: [id number] Date of Birth: April 12, 1941  Date of referral:  12/08/16               Reason for consult:  Facility Placement                Permission sought to share information with:    Permission granted to share information::     Name::        Agency::     Relationship::     Contact Information:     Housing/Transportation Living arrangements for the past 2 months:  [place] of Information:  Facility Patient Interpreter Needed:  None Criminal Activity/Legal Involvement Pertinent to Current Situation/Hospitalization:  No - Comment as needed Significant Relationships:  None Lives with:  Facility Resident Do you feel safe going back to the place where you live?    Need for family participation in patient care:     Care giving concerns:  Patient resides at [facility].   Social Worker assessment / plan:  CSW informed by MD that patient is to discharge today to return to the group home. CSW watched while patient was ambulated by physical therapy and they are recommending home health. CSW coordinated the discharge and spoke with Mariann Laster from the group home: [phone]. Mariann Laster stated that patient could return and that she would come to pick patient up. Mariann Laster asked that today's dose of levaquin be given prior to him leaving and it was.   Employment status:  Disabled (Comment on whether or not currently receiving Disability) Insurance information:  Medicare PT Recommendations:  Home with [place] / Referral to community resources:     Patient/Family's Response to care:  Patient willing to return to group home.  Patient/Family's Understanding of and Emotional Response to Diagnosis, Current Treatment, and Prognosis:  Patient aware of discharge and in agreement.   Emotional Assessment Appearance:  Appears stated age Attitude/Demeanor/Rapport:   (cooperative) Affect  (typically observed):  Calm Orientation:  Oriented to Self, Oriented to Situation, Oriented to Place Alcohol / Substance use:  Not Applicable Psych involvement (Current and /or in the community):  No (Comment)  Discharge Needs  Concerns to be addressed:  Care Coordination Readmission within the last 30 days:    Current discharge risk:  None Barriers to Discharge:  No Barriers Identified   Shela Leff, LCSW 12/08/2016, 1:07 PM
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
## 2016-12-08 NOTE — Care Management Important Message (Signed)
Important Message  Patient Details  Name: Henry Smith MRN: [id number] Date of Birth: 11/08/1941   Medicare Important Message Given:  Yes    Jolly Mango, RN 12/08/2016, 10:42 AM

## 2016-12-08 NOTE — Discharge Instructions (Signed)
Heart healthy diet. Fall precaution. HHPT

## 2016-12-08 NOTE — NC FL2 (Signed)
  [place] LEVEL OF CARE SCREENING TOOL     IDENTIFICATION  Patient Name: Henry Smith Birthdate: [birth year] Sex: male Admission Date (Current Location): 12/06/2016  [facility] and [place] Number:  Engineering geologist and Address:  [facility], [address] [place], [place] [address]      Provider Number: [id number]  Attending Physician Name and Address:  Demetrios Loll, MD  Relative Name and Phone Number:       Current Level of Care: Hospital Recommended Level of Care: [place] Prior Approval Number:    Date Approved/Denied:   PASRR Number:    Discharge Plan:  (family care home)    Current Diagnoses: Patient Active Problem List   Diagnosis Date Noted  . Hypercalcemia 11/01/2016  . Prostate cancer ([place]) 10/30/2016  . Schizophrenia ([place]) 10/26/2016    Orientation RESPIRATION BLADDER Height & Weight     Self, Place  Normal Incontinent Weight: 231 lb (104.8 kg) Height:  5\' 10"  (177.8 cm)  BEHAVIORAL SYMPTOMS/MOOD NEUROLOGICAL BOWEL NUTRITION STATUS   (none) Convulsions/Seizures Continent Diet (heart healthy)  AMBULATORY STATUS COMMUNICATION OF NEEDS Skin   Supervision Verbally Normal                       Personal Care Assistance Level of Assistance  Bathing, Dressing Bathing Assistance: Limited assistance   Dressing Assistance: Limited assistance     Functional Limitations Info             SPECIAL CARE FACTORS FREQUENCY  PT (By licensed PT) (home health)                    Contractures Contractures Info: Not present    Additional Factors Info  Code Status, Allergies Code Status Info: full Allergies Info: nka           DISCHARGE MEDICATIONS:   Allergies as of 12/08/2016   No Known Allergies        Medication List    TAKE these medications   divalproex 250 MG DR tablet Commonly known as:  DEPAKOTE Take 250 mg by mouth at bedtime. What changed:  Another medication with  the same name was removed. Continue taking this medication, and follow the directions you see here.  gabapentin 300 MG capsule Commonly known as:  NEURONTIN Take 300 mg by mouth 3 (three) times daily.  levofloxacin 500 MG tablet Commonly known as:  LEVAQUIN Take 1 tablet (500 mg total) by mouth daily.  Melatonin 5 MG Tabs Take 5 mg by mouth at bedtime.  Vitamin D3 2000 units capsule Take 4,000 Units by mouth.     Relevant Imaging Results:  Relevant Lab Results:   Additional Information    Shela Leff, LCSW

## 2016-12-08 NOTE — Care Management Note (Addendum)
Case Management Note  Patient Details  Name: Henry Smith MRN: [id number] Date of Birth: 1941/05/17  Subjective/Objective:    Patient presents from [facility]. Will need home health PT, SN at DC today. Referral to [facility] for [facility] PT/SN                Action/Plan: [facility] for [facility] PT/SN . Case closed.   Expected Discharge Date:     12/08/2016             Expected Discharge Plan:  [place]  In-House Referral:  Clinical Social Work  Discharge planning Services  CM Consult  Post Acute Care Choice:  Home Health Choice offered to:  Patient  DME Arranged:    DME Agency:     HH Arranged:  PT, SN [place] Agency:  [place]  Status of Service:  Completed, signed off  If discussed at [place] of Stay Meetings, dates discussed:    Additional Comments:  Jolly Mango, RN 12/08/2016, 12:13 PM

## 2016-12-08 NOTE — Progress Notes (Signed)
Pt to be discharged per MD order. Iv removed. Instructions printed and placed in envelope for facility transporter. Report called and notified Mariann Laster from [facility]. Discharge packet prepared by [facility] CSW and [facility] set up via [facility]. Will discharge pt in wheelchair when facility transport arrives.

## 2016-12-12 LAB — PTH-RELATED PEPTIDE

## 2016-12-15 ENCOUNTER — Inpatient Hospital Stay: Payer: Medicare Other | Admitting: Hematology and Oncology

## 2016-12-18 ENCOUNTER — Inpatient Hospital Stay: Payer: Medicare Other | Admitting: Hematology and Oncology
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
## 2016-12-20 ENCOUNTER — Encounter: Payer: Self-pay | Admitting: Hematology and Oncology
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
## 2016-12-21 ENCOUNTER — Inpatient Hospital Stay: Payer: Medicare Other | Attending: Hematology and Oncology | Admitting: Hematology and Oncology

## 2016-12-21 ENCOUNTER — Encounter: Payer: Self-pay | Admitting: Hematology and Oncology

## 2016-12-21 VITALS — BP 128/87 | HR 90 | Temp 95.9°F | Resp 18 | Wt 241.6 lb

## 2016-12-21 DIAGNOSIS — E210 Primary hyperparathyroidism: Secondary | ICD-10-CM | POA: Insufficient documentation

## 2016-12-21 DIAGNOSIS — Z79899 Other long term (current) drug therapy: Secondary | ICD-10-CM | POA: Insufficient documentation

## 2016-12-21 DIAGNOSIS — Y95 Nosocomial condition: Secondary | ICD-10-CM | POA: Insufficient documentation

## 2016-12-21 DIAGNOSIS — F209 Schizophrenia, unspecified: Secondary | ICD-10-CM | POA: Diagnosis not present

## 2016-12-21 DIAGNOSIS — Z923 Personal history of irradiation: Secondary | ICD-10-CM

## 2016-12-21 DIAGNOSIS — D72819 Decreased white blood cell count, unspecified: Secondary | ICD-10-CM | POA: Diagnosis not present

## 2016-12-21 DIAGNOSIS — D696 Thrombocytopenia, unspecified: Secondary | ICD-10-CM

## 2016-12-21 DIAGNOSIS — F17210 Nicotine dependence, cigarettes, uncomplicated: Secondary | ICD-10-CM | POA: Insufficient documentation

## 2016-12-21 DIAGNOSIS — J189 Pneumonia, unspecified organism: Secondary | ICD-10-CM | POA: Insufficient documentation

## 2016-12-21 DIAGNOSIS — K029 Dental caries, unspecified: Secondary | ICD-10-CM | POA: Insufficient documentation

## 2016-12-21 DIAGNOSIS — C61 Malignant neoplasm of prostate: Secondary | ICD-10-CM

## 2016-12-21 DIAGNOSIS — Z8546 Personal history of malignant neoplasm of prostate: Secondary | ICD-10-CM

## 2017-01-06 ENCOUNTER — Ambulatory Visit: Payer: Self-pay | Admitting: Urology

## 2017-01-06 ENCOUNTER — Encounter: Payer: Self-pay | Admitting: Urology
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
## 2017-01-27 ENCOUNTER — Ambulatory Visit: Payer: Self-pay | Admitting: Urology

## 2017-01-28 ENCOUNTER — Inpatient Hospital Stay (HOSPITAL_COMMUNITY)
Admission: EM | Admit: 2017-01-28 | Discharge: 2017-02-04 | DRG: 640 | Disposition: A | Discharge status: Skilled Nursing Facility | Payer: Medicare Other | Attending: Internal Medicine | Admitting: Internal Medicine

## 2017-01-28 ENCOUNTER — Emergency Department (HOSPITAL_COMMUNITY): Payer: Medicare Other

## 2017-01-28 ENCOUNTER — Encounter (HOSPITAL_COMMUNITY): Payer: Self-pay | Admitting: Emergency Medicine

## 2017-01-28 DIAGNOSIS — Z6833 Body mass index (BMI) 33.0-33.9, adult: Secondary | ICD-10-CM

## 2017-01-28 DIAGNOSIS — D696 Thrombocytopenia, unspecified: Secondary | ICD-10-CM | POA: Diagnosis present

## 2017-01-28 DIAGNOSIS — I10 Essential (primary) hypertension: Secondary | ICD-10-CM | POA: Diagnosis present

## 2017-01-28 DIAGNOSIS — A530 Latent syphilis, unspecified as early or late: Secondary | ICD-10-CM | POA: Diagnosis present

## 2017-01-28 DIAGNOSIS — Z8546 Personal history of malignant neoplasm of prostate: Secondary | ICD-10-CM

## 2017-01-28 DIAGNOSIS — R29710 NIHSS score 10: Secondary | ICD-10-CM | POA: Diagnosis present

## 2017-01-28 DIAGNOSIS — R29810 Facial weakness: Secondary | ICD-10-CM | POA: Diagnosis present

## 2017-01-28 DIAGNOSIS — A539 Syphilis, unspecified: Secondary | ICD-10-CM

## 2017-01-28 DIAGNOSIS — I639 Cerebral infarction, unspecified: Secondary | ICD-10-CM

## 2017-01-28 DIAGNOSIS — E669 Obesity, unspecified: Secondary | ICD-10-CM | POA: Diagnosis present

## 2017-01-28 DIAGNOSIS — R410 Disorientation, unspecified: Secondary | ICD-10-CM

## 2017-01-28 DIAGNOSIS — Z79899 Other long term (current) drug therapy: Secondary | ICD-10-CM

## 2017-01-28 DIAGNOSIS — F17210 Nicotine dependence, cigarettes, uncomplicated: Secondary | ICD-10-CM | POA: Diagnosis present

## 2017-01-28 DIAGNOSIS — R4702 Dysphasia: Secondary | ICD-10-CM | POA: Diagnosis present

## 2017-01-28 DIAGNOSIS — F209 Schizophrenia, unspecified: Secondary | ICD-10-CM | POA: Diagnosis present

## 2017-01-28 DIAGNOSIS — R531 Weakness: Secondary | ICD-10-CM

## 2017-01-28 DIAGNOSIS — G9341 Metabolic encephalopathy: Secondary | ICD-10-CM | POA: Diagnosis present

## 2017-01-28 DIAGNOSIS — R4182 Altered mental status, unspecified: Secondary | ICD-10-CM | POA: Diagnosis present

## 2017-01-28 DIAGNOSIS — D709 Neutropenia, unspecified: Secondary | ICD-10-CM | POA: Diagnosis present

## 2017-01-28 DIAGNOSIS — D351 Benign neoplasm of parathyroid gland: Secondary | ICD-10-CM | POA: Diagnosis present

## 2017-01-28 DIAGNOSIS — G8194 Hemiplegia, unspecified affecting left nondominant side: Secondary | ICD-10-CM | POA: Diagnosis present

## 2017-01-28 LAB — COMPREHENSIVE METABOLIC PANEL
ALBUMIN: 3.7 g/dL (ref 3.5–5.0)
ALT: 78 U/L — ABNORMAL HIGH (ref 17–63)
ALT: 79 U/L — ABNORMAL HIGH (ref 17–63)
ANION GAP: 11 (ref 5–15)
ANION GAP: 8 (ref 5–15)
AST: 93 U/L — ABNORMAL HIGH (ref 15–41)
AST: 76 U/L — ABNORMAL HIGH (ref 15–41)
Albumin: 3.5 g/dL (ref 3.5–5.0)
Alkaline Phosphatase: 56 U/L (ref 38–126)
Alkaline Phosphatase: 52 U/L (ref 38–126)
BUN: 10 mg/dL (ref 6–20)
BUN: 8 mg/dL (ref 6–20)
CHLORIDE: 107 mmol/L (ref 101–111)
CO2: 24 mmol/L (ref 22–32)
CO2: 29 mmol/L (ref 22–32)
Calcium: 11.9 mg/dL — ABNORMAL HIGH (ref 8.9–10.3)
Calcium: 11.7 mg/dL — ABNORMAL HIGH (ref 8.9–10.3)
Chloride: 109 mmol/L (ref 101–111)
Creatinine, Ser: 1.24 mg/dL (ref 0.61–1.24)
Creatinine, Ser: 1.09 mg/dL (ref 0.61–1.24)
GFR calc Af Amer: >60 mL/min (ref >60)
GFR calc non Af Amer: 56 mL/min — ABNORMAL LOW (ref >60)
GLUCOSE: 110 mg/dL — AB (ref 65–99)
Glucose, Bld: 89 mg/dL (ref 65–99)
POTASSIUM: 4.9 mmol/L (ref 3.5–5.1)
POTASSIUM: 4.2 mmol/L (ref 3.5–5.1)
SODIUM: 144 mmol/L (ref 135–145)
Sodium: 144 mmol/L (ref 135–145)
TOTAL PROTEIN: 7 g/dL (ref 6.5–8.1)
Total Bilirubin: 1.7 mg/dL — ABNORMAL HIGH (ref 0.3–1.2)
Total Bilirubin: 1 mg/dL (ref 0.3–1.2)
Total Protein: 6.7 g/dL (ref 6.5–8.1)

## 2017-01-28 LAB — DIFFERENTIAL
Basophils Absolute: 0 10*3/uL (ref 0.0–0.1)
Basophils Relative: 0 %
EOS PCT: 0 %
Eosinophils Absolute: 0 10*3/uL (ref 0.0–0.7)
Lymphocytes Relative: 17 %
Lymphs Abs: 0.6 10*3/uL — ABNORMAL LOW (ref 0.7–4.0)
Monocytes Absolute: 0.4 10*3/uL (ref 0.1–1.0)
Monocytes Relative: 12 %
NEUTROS PCT: 71 %
Neutro Abs: 2.4 10*3/uL (ref 1.7–7.7)

## 2017-01-28 LAB — CBC
HCT: 47.7 % (ref 39.0–52.0)
Hemoglobin: 15.2 g/dL (ref 13.0–17.0)
MCH: 28.4 pg (ref 26.0–34.0)
MCHC: 31.9 g/dL (ref 30.0–36.0)
MCV: 89.2 fL (ref 78.0–100.0)
PLATELETS: 93 10*3/uL — AB (ref 150–400)
RBC: 5.35 MIL/uL (ref 4.22–5.81)
RDW: 15 % (ref 11.5–15.5)
WBC: 3.4 10*3/uL — ABNORMAL LOW (ref 4.0–10.5)

## 2017-01-28 LAB — CBG MONITORING, ED: GLUCOSE-CAPILLARY: 128 mg/dL — AB (ref 65–99)

## 2017-01-28 LAB — VALPROIC ACID LEVEL: Valproic Acid Lvl: 50 ug/mL (ref 50.0–100.0)

## 2017-01-28 LAB — I-STAT CHEM 8, ED
BUN: 12 mg/dL (ref 6–20)
CALCIUM ION: 1.42 mmol/L — AB (ref 1.15–1.40)
CHLORIDE: 110 mmol/L (ref 101–111)
Creatinine, Ser: 1 mg/dL (ref 0.61–1.24)
Glucose, Bld: 117 mg/dL — ABNORMAL HIGH (ref 65–99)
HCT: 50 % (ref 39.0–52.0)
Hemoglobin: 17 g/dL (ref 13.0–17.0)
POTASSIUM: 4 mmol/L (ref 3.5–5.1)
SODIUM: 146 mmol/L — AB (ref 135–145)
TCO2: 25 mmol/L (ref 0–100)

## 2017-01-28 LAB — I-STAT TROPONIN, ED: Troponin i, poc: 0 ng/mL (ref 0.00–0.08)

## 2017-01-28 LAB — APTT: aPTT: 28 seconds (ref 24–36)

## 2017-01-28 LAB — PROTIME-INR
INR: 1.14
Prothrombin Time: 14.7 seconds (ref 11.4–15.2)

## 2017-01-28 MED ORDER — SODIUM CHLORIDE 0.9 % IV BOLUS (SEPSIS): 1000.0000 mL | Freq: Once | INTRAVENOUS | Status: DC

## 2017-01-28 MED ORDER — SODIUM CHLORIDE 0.9% FLUSH
3.0000 mL | Freq: Two times a day (BID) | INTRAVENOUS | Status: DC
  Administered 2017-01-28 – 2017-02-03 (×8): 3 mL via INTRAVENOUS

## 2017-01-28 MED ORDER — ACETAMINOPHEN 650 MG RE SUPP: 650.0000 mg | Freq: Four times a day (QID) | RECTAL | Status: DC | PRN

## 2017-01-28 MED ORDER — VALPROATE SODIUM 500 MG/5ML IV SOLN
250.0000 mg | Freq: Once | INTRAVENOUS | Status: AC
  Administered 2017-01-29: 250 mg via INTRAVENOUS
  Filled 2017-01-28: qty 2.5

## 2017-01-28 MED ORDER — DIVALPROEX SODIUM 250 MG PO DR TAB
250.0000 mg | DELAYED_RELEASE_TABLET | Freq: Every day | ORAL | Status: DC
  Administered 2017-01-29: 250 mg via ORAL
  Filled 2017-01-28: qty 1

## 2017-01-28 MED ORDER — ENOXAPARIN SODIUM 40 MG/0.4ML ~~LOC~~ SOLN
40.0000 mg | SUBCUTANEOUS | Status: DC
  Administered 2017-01-28 – 2017-02-03 (×7): 40 mg via SUBCUTANEOUS
  Filled 2017-01-28 (×8): qty 0.4

## 2017-01-28 MED ORDER — IOPAMIDOL (ISOVUE-370) INJECTION 76%
INTRAVENOUS | Status: AC
  Administered 2017-01-28: 50 mL
  Filled 2017-01-28: qty 50

## 2017-01-28 MED ORDER — SODIUM CHLORIDE 0.9 % IV SOLN
INTRAVENOUS | Status: DC
  Administered 2017-01-28: 23:00:00 via INTRAVENOUS

## 2017-01-28 MED ORDER — ACETAMINOPHEN 325 MG PO TABS
650.0000 mg | ORAL_TABLET | Freq: Four times a day (QID) | ORAL | Status: DC | PRN
  Administered 2017-01-30: 650 mg via ORAL
  Filled 2017-01-28: qty 2

## 2017-01-28 NOTE — H&P (Signed)
Date: 01/28/2017               Patient Name:  Henry Smith MRN: [id number]  DOB: Sep 24, 1941 Age / Sex: 76 y.o., male   PCP: Lorelee Market, MD         Medical Service: Internal Medicine Teaching Service         Attending Physician: Dr. Aldine Contes, MD    First Contact: Dr. Ophelia Shoulder Pager: [phone]  Second Contact: Dr. 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 The majority of the history of present illness was taken from chart review from neurology consultation and the emergency medicine provider as when we assessed the patient he was altered and unable to participate in the interview. Per chart review the patient's symptoms occurred while he was eating lunch and was doing well prior to this. Also per reports, the patient had an acute onset of facial droop at the facility and was not responsive.   In the emergency department the patient was afebrile and hemodynamically stable. A code stroke was called. CT head without contrast showed no acute intracranial abnormality. CT angiography head and neck showed no evidence of emergent large vessel occlusion, no significant atherosclerosis in the neck, no intracranial arterial occlusion identified. Laboratory evaluation demonstrated a leukopenia with a white blood cell count of 3.4 and a thrombocytopenia with a platelet count of 93. Comprehensive metabolic panel demonstrated a calcium of 11.9. AST of 93 ALT of 78. I-STAT troponin was negative. He was then admitted to the teaching service for further  management and evaluation.  Meds:  No outpatient prescriptions have been marked as taking for the 01/28/17 encounter [facility] Encounter).     Allergies: Allergies as of 01/28/2017  . (No Known Allergies)   Past Medical History:  Diagnosis Date  . Cancer [facility])    Prostate cancer    Family History:  Family History  Problem Relation Age of Onset  . Diabetes Neg Hx      Social History:  Social History   Social History  . Marital status: Divorced    Spouse name: N/A  . Number of children: 3  . Years of education: N/A   Occupational History  . retired     rail[employer] unable to provide name   Social History Main Topics  . Smoking status: Current Every Day Smoker    Packs/day: 1.00    Years: 60.00    Types: Cigarettes    Start date: 10/25/1968  . Smokeless tobacco: Current User  . Alcohol use 0.6 oz/week    1 Cans of beer per week     Comment: occasionally  . Drug use: Yes    Types: Marijuana     Comment: occasionally  . Sexual activity: Yes    Partners: Female    Birth control/ protection: Condom   Other Topics Concern  . Not on file   Social History Narrative  . No narrative on file     Review of Systems: A complete ROS was negative except as per HPI. Unable to be obtained secondary to  mental status.  Physical Exam: Blood pressure 151/89, pulse 96, temperature 98.4 F (36.9 C), temperature source Oral, resp. rate 25, weight 229 lb 11.5 oz (104.2 kg), SpO2 96 %. Physical Exam  Constitutional:  Intermittently alert  HENT:  Head: Normocephalic and atraumatic.  Poor oral dentition  Eyes: Pupils are equal, round, and reactive to light.  Cardiovascular: Normal rate and regular rhythm.   Murmur heard. Holosystolic murmur appreciated best at the left upper sternal border  Respiratory: Effort normal and breath sounds normal. No respiratory distress. He has no wheezes.  GI: Soft. Bowel sounds are normal. He exhibits no distension. There is no tenderness.    Musculoskeletal: He exhibits edema.  Bilateral lower extremity edema  Neurological:  Patient was alert but not oriented to place or time. He had waxing and waning levels of consciousness. Pupils equal round reactive to light. No cranial nerve deficits appreciated. Strength diminished bilaterally in the upper and lower extremities.      EKG: Sinus tachycardia RBBB and LAFB  CXR: Not obtained  Assessment & Plan by Problem: Active Problems:   Altered mental status, unspecified  Patient is a 76 year old male with a past medical history of prostate cancer, schizophrenia, hypercalcemia with potential parathyroid adenoma who presents from a facility with altered mental status.  # Altered mental status The patient presents from his facility with altered mental status. Code stroke was initially called but neuroimaging thus far has been nonrevealing. His mental status is waxing and waning. No focal neurological deficits were appreciated on examination. Laboratory evaluation demonstrated hypercalcemia. Currently, the differential diagnosis for the patient's altered mental status is broad and includes stroke, TIA, hypercalcemia, drug use, metabolic encephalopathy or other underlying process. However, given the patient's waxing and waning levels of consciousness at this time I am most concerned that his altered mental status may be secondary to hypercalcemia. Current calcium of 11.9. We will treat with aggressive IV hydration. Other options such as calcitonin are only indicated in a patient with a calcium level greater than 14. We may need to consider adding one of these medications if his altered mental status is not improved in the interval. Additionally, the patient has a history of prostate cancer and metastatic disease could be an explination for his altered mental status. However, no evidence of cannonball lesions at the white gray matter junction were appreciated on neuroimaging studies and I am less  concerned that metastatic disease would be presenting in such a way. Neurology is following the patient and we appreciate their recommendations going forward. -- 2 L normal saline over 4 hours, aggressive hydration with 125 mL an hour normal saline following -- CMP @ 2000 if still hypercalcemic may increase fluids, CMP tomorrow -- Urinalysis -- Urine drug screen -- HIV -- RPR -- Valproic acid level  # Schizophrenia The patient has a history of schizophrenia which he takes divalproex. We will check a level to ensure that he is not supratherapeutic on this medication. If he is within normal limits or not supratherapeutic we'll restart this medication. -- Follow-up valproic acid level -- Restart divalproex pending above  DVT/PE prophylaxis: Lovenox FEN/GI: nothing by mouth Code: Full code  Dispo: Admit patient to Inpatient with expected length of stay greater than 2 midnights.  Signed: Ophelia Shoulder, MD 01/28/2017, 5:14 PM  Pager: [phone]
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
## 2017-01-28 NOTE — ED Triage Notes (Signed)
To ED via [facility] from an adult living facility- independent type home. Was eating lunch at approx 1115 and became weak on left side, slurred speech, unable to stand on own- Pt has hx of prostate ca, seizures.  IV 20g started in right ac per EMS

## 2017-01-28 NOTE — ED Notes (Signed)
Pt changed and clean sheets placed on bed.

## 2017-01-28 NOTE — ED Notes (Signed)
Code stroke cancelled 
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
## 2017-01-28 NOTE — Progress Notes (Addendum)
CSW contacted Patient's emergency contact, [name] [phone]), who works at [facility] [facility] [employer] in [place], [place]. Mr. Jeanell Sparrow reports that Patient's family lives in [place], [place] and he will contact the nurse at the [facility] to obtain contact information.   Upon further chart review, CSW found Patient's brother's number, Dagoberto Reef Graffeo (ph: [phone]). CSW contacted Gareth Eagle who informed CSW that his brother, Raymand Yenter "usually handles all of that". Gareth Eagle provided CSW with William's number [phone]). No answer. HIPAA compliant voice message left. CSW called Mr. Gareth Eagle back to inform him that his brother had not answered and asked that family get here as soon as possible per RN. CSW instructed family to contact the hospital for medical updates.   1445: CSW updated Patient's contact information with above noted next of kin   Lorrine Kin, MSW, LCSW [facility] ED/85M Clinical Social Worker [phone]

## 2017-01-28 NOTE — ED Notes (Signed)
Spoke with pt's home health nurse-- [name], RN -- please call if there are any questions--- [phone]. Pt's guardian= [name]  [phone].

## 2017-01-28 NOTE — ED Notes (Signed)
Returned from CTA to trauma C

## 2017-01-28 NOTE — Code Documentation (Signed)
76 y.o. Male arrives to [facility] ED via [facility]. Pt was in his normal state of health this morning at the group home he resides at. At 1115 he was eating lunch with other residents when he had an acute onset of left sided weakness, left sided facial droop and slurred speech. EMS was notified and a code stroke was called. On arrival to [facility] ED, labs were drawn and pt taken to CT. CT showing no acute intracranial abnormality. NIHSS 15. See EMR for code stroke times and NIHSS. On exam pt drowsy with generalized weakness and dysarthria. Upon reviewing the pt's chart prior to arrival, a hx of platelets below 100,00 was found. Plan to wait until platelet results are back prior to making IV tPA decision. Platelets are 93,000, tPA not given d/t platelets below 100,000. CTA negative for LVO. Not an IR candidate. Code stroke canceled per neurologist. Bedside handoff with ED RN Santiago Glad

## 2017-01-29 ENCOUNTER — Encounter (HOSPITAL_COMMUNITY): Payer: Self-pay | Admitting: *Deleted

## 2017-01-29 ENCOUNTER — Observation Stay (HOSPITAL_BASED_OUTPATIENT_CLINIC_OR_DEPARTMENT_OTHER): Payer: Medicare Other

## 2017-01-29 ENCOUNTER — Observation Stay (HOSPITAL_COMMUNITY): Payer: Medicare Other

## 2017-01-29 DIAGNOSIS — R410 Disorientation, unspecified: Secondary | ICD-10-CM

## 2017-01-29 DIAGNOSIS — R609 Edema, unspecified: Secondary | ICD-10-CM

## 2017-01-29 DIAGNOSIS — I10 Essential (primary) hypertension: Secondary | ICD-10-CM

## 2017-01-29 DIAGNOSIS — E669 Obesity, unspecified: Secondary | ICD-10-CM

## 2017-01-29 DIAGNOSIS — I350 Nonrheumatic aortic (valve) stenosis: Secondary | ICD-10-CM

## 2017-01-29 DIAGNOSIS — C61 Malignant neoplasm of prostate: Secondary | ICD-10-CM

## 2017-01-29 DIAGNOSIS — D696 Thrombocytopenia, unspecified: Secondary | ICD-10-CM

## 2017-01-29 DIAGNOSIS — Z8639 Personal history of other endocrine, nutritional and metabolic disease: Secondary | ICD-10-CM | POA: Diagnosis not present

## 2017-01-29 DIAGNOSIS — Z8546 Personal history of malignant neoplasm of prostate: Secondary | ICD-10-CM | POA: Diagnosis not present

## 2017-01-29 DIAGNOSIS — K0889 Other specified disorders of teeth and supporting structures: Secondary | ICD-10-CM | POA: Diagnosis not present

## 2017-01-29 DIAGNOSIS — R4182 Altered mental status, unspecified: Secondary | ICD-10-CM | POA: Diagnosis not present

## 2017-01-29 DIAGNOSIS — F209 Schizophrenia, unspecified: Secondary | ICD-10-CM | POA: Diagnosis not present

## 2017-01-29 DIAGNOSIS — D709 Neutropenia, unspecified: Secondary | ICD-10-CM

## 2017-01-29 LAB — COMPREHENSIVE METABOLIC PANEL
ALK PHOS: 48 U/L (ref 38–126)
ALK PHOS: 45 U/L (ref 38–126)
ALT: 80 U/L — AB (ref 17–63)
ALT: 131 U/L — AB (ref 17–63)
ANION GAP: 4 — AB (ref 5–15)
AST: 81 U/L — ABNORMAL HIGH (ref 15–41)
AST: 155 U/L — ABNORMAL HIGH (ref 15–41)
Albumin: 3.1 g/dL — ABNORMAL LOW (ref 3.5–5.0)
Albumin: 3 g/dL — ABNORMAL LOW (ref 3.5–5.0)
Anion gap: 7 (ref 5–15)
BILIRUBIN TOTAL: 0.9 mg/dL (ref 0.3–1.2)
BILIRUBIN TOTAL: 0.7 mg/dL (ref 0.3–1.2)
BUN: 11 mg/dL (ref 6–20)
BUN: 11 mg/dL (ref 6–20)
CALCIUM: 11.4 mg/dL — AB (ref 8.9–10.3)
CALCIUM: 11 mg/dL — AB (ref 8.9–10.3)
CO2: 26 mmol/L (ref 22–32)
CO2: 27 mmol/L (ref 22–32)
CREATININE: 0.83 mg/dL (ref 0.61–1.24)
CREATININE: 0.85 mg/dL (ref 0.61–1.24)
Chloride: 110 mmol/L (ref 101–111)
Chloride: 110 mmol/L (ref 101–111)
GFR calc non Af Amer: >60 mL/min (ref >60)
Glucose, Bld: 77 mg/dL (ref 65–99)
Glucose, Bld: 92 mg/dL (ref 65–99)
Potassium: 4.1 mmol/L (ref 3.5–5.1)
Potassium: 4 mmol/L (ref 3.5–5.1)
SODIUM: 141 mmol/L (ref 135–145)
Sodium: 143 mmol/L (ref 135–145)
TOTAL PROTEIN: 6 g/dL — AB (ref 6.5–8.1)
TOTAL PROTEIN: 5.9 g/dL — AB (ref 6.5–8.1)

## 2017-01-29 LAB — URINALYSIS, ROUTINE W REFLEX MICROSCOPIC
BILIRUBIN URINE: NEGATIVE
Bacteria, UA: NONE SEEN
GLUCOSE, UA: NEGATIVE mg/dL
Hgb urine dipstick: NEGATIVE
KETONES UR: 20 mg/dL — AB
LEUKOCYTES UA: NEGATIVE
Nitrite: NEGATIVE
PH: 6 (ref 5.0–8.0)
Protein, ur: 30 mg/dL — AB
SPECIFIC GRAVITY, URINE: 1.038 — AB (ref 1.005–1.030)

## 2017-01-29 LAB — LIPID PANEL
Cholesterol: 125 mg/dL (ref 0–200)
HDL: 45 mg/dL (ref >40)
LDL CALC: 63 mg/dL (ref 0–99)
TRIGLYCERIDES: 86 mg/dL (ref <150)
Total CHOL/HDL Ratio: 2.8 RATIO
VLDL: 17 mg/dL (ref 0–40)

## 2017-01-29 LAB — TSH: TSH: 0.509 u[IU]/mL (ref 0.350–4.500)

## 2017-01-29 LAB — RAPID URINE DRUG SCREEN, HOSP PERFORMED
Amphetamines: NOT DETECTED
BARBITURATES: NOT DETECTED
BENZODIAZEPINES: NOT DETECTED
Cocaine: NOT DETECTED
Opiates: NOT DETECTED
TETRAHYDROCANNABINOL: NOT DETECTED

## 2017-01-29 LAB — CBC
HCT: 45.1 % (ref 39.0–52.0)
Hemoglobin: 14.1 g/dL (ref 13.0–17.0)
MCH: 27.8 pg (ref 26.0–34.0)
MCHC: 31.3 g/dL (ref 30.0–36.0)
MCV: 89 fL (ref 78.0–100.0)
PLATELETS: 83 10*3/uL — AB (ref 150–400)
RBC: 5.07 MIL/uL (ref 4.22–5.81)
RDW: 15.1 % (ref 11.5–15.5)
WBC: 2.6 10*3/uL — AB (ref 4.0–10.5)

## 2017-01-29 LAB — ECHOCARDIOGRAM COMPLETE: WEIGHTICAEL: 3720 [oz_av]

## 2017-01-29 LAB — HIV ANTIBODY (ROUTINE TESTING W REFLEX): HIV Screen 4th Generation wRfx: NONREACTIVE

## 2017-01-29 LAB — GLUCOSE, CAPILLARY: GLUCOSE-CAPILLARY: 79 mg/dL (ref 65–99)

## 2017-01-29 LAB — RPR, QUANT+TP ABS (REFLEX)
Rapid Plasma Reagin, Quant: 1:2 {titer} — ABNORMAL HIGH
TREPONEMA PALLIDUM AB: POSITIVE — AB

## 2017-01-29 LAB — PHOSPHORUS: Phosphorus: 2 mg/dL — ABNORMAL LOW (ref 2.5–4.6)

## 2017-01-29 LAB — VITAMIN B12: VITAMIN B 12: 597 pg/mL (ref 180–914)

## 2017-01-29 LAB — RPR: RPR Ser Ql: REACTIVE — AB

## 2017-01-29 MED ORDER — SODIUM CHLORIDE 0.9 % IV BOLUS (SEPSIS)
2000.0000 mL | Freq: Once | INTRAVENOUS | Status: AC
  Administered 2017-01-29: 2000 mL via INTRAVENOUS

## 2017-01-29 MED ORDER — SODIUM CHLORIDE 0.9 % IV SOLN
INTRAVENOUS | Status: AC
  Administered 2017-01-29: 11:00:00 via INTRAVENOUS

## 2017-01-29 MED ORDER — ASPIRIN EC 325 MG PO TBEC
325.0000 mg | DELAYED_RELEASE_TABLET | Freq: Every day | ORAL | Status: DC
  Administered 2017-01-29 – 2017-02-04 (×7): 325 mg via ORAL
  Filled 2017-01-29 (×7): qty 1

## 2017-01-29 NOTE — Progress Notes (Signed)
STROKE TEAM PROGRESS NOTE   HISTORY OF PRESENT ILLNESS (per record) Henry Smith is an 76 y.o. male who presented to the ED from his group home with sudden onset left arm and leg weakness and difficulty speaking. His symptoms occurred while he was eating lunch and was doing well earlier today. Per review of the chart, he is followed by Oncology for localized prostate cancer, last seen 12/21/16, and referred to Endocrinology for hypercalcemia but was subsequently found to have US findings suggestive of parathyroid adenoma. Bone scan at that time showed some increased uptake in the calvarium though unsure if neoplastic vs inflammatory. In the ED, head CT was without any acute findings. Preliminary findings of his CT angiography were without findings of an acute thrombosis. He was last known well 01/28/2017 at 11:15 AM. Patient was not administered IV t-PA secondary to thrombocytopenia. He was admitted for further evaluation and treatment.   SUBJECTIVE (INTERVAL HISTORY) No family at bedside. Patient lying in bed, still confused with perseverations. Moving all extremities but left facial droop. CTA head and neck unremarkable. Negative DVT. MRI still pending. UDS negative.   OBJECTIVE Temp:  [97.9 F (36.6 C)-98.6 F (37 C)] 98 F (36.7 C) (02/16 1039) Pulse Rate:  [80-108] 80 (02/16 1039) Cardiac Rhythm: Normal sinus rhythm (02/16 0700) Resp:  [16-25] 16 (02/16 1039) BP: (119-174)/(71-140) 166/84 (02/16 1039) SpO2:  [94 %-100 %] 97 % (02/16 1039) Weight:  [104.2 kg (229 lb 11.5 oz)-105.5 kg (232 lb 8 oz)] 105.5 kg (232 lb 8 oz) (02/16 0500)  CBC:  Recent Labs Lab 01/28/17 1304 01/28/17 1313 01/29/17 0338  WBC 3.4*  --  2.6*  NEUTROABS 2.4  --   --   HGB 15.2 17.0 14.1  HCT 47.7 50.0 45.1  MCV 89.2  --  89.0  PLT 93*  --  83*    Basic Metabolic Panel:  Recent Labs Lab 01/28/17 2040 01/29/17 0338 01/29/17 0712  NA 144 143  --   K 4.2 4.1  --   CL 107 110  --   CO2 29 26  --    GLUCOSE 89 77  --   BUN 8 11  --   CREATININE 1.09 0.83  --   CALCIUM 11.7* 11.4*  --   PHOS  --   --  2.0*    Lipid Panel:    Component Value Date/Time   CHOL 125 01/29/2017 0712   TRIG 86 01/29/2017 0712   HDL 45 01/29/2017 0712   CHOLHDL 2.8 01/29/2017 0712   VLDL 17 01/29/2017 0712   LDLCALC 63 01/29/2017 0712   HgbA1c: No results found for: HGBA1C Urine Drug Screen:    Component Value Date/Time   LABOPIA NONE DETECTED 01/28/2017 0807   COCAINSCRNUR NONE DETECTED 01/28/2017 0807   COCAINSCRNUR NONE DETECTED 10/24/2016 2000   LABBENZ NONE DETECTED 01/28/2017 0807   AMPHETMU NONE DETECTED 01/28/2017 0807   THCU NONE DETECTED 01/28/2017 0807   LABBARB NONE DETECTED 01/28/2017 0807      IMAGING I have personally reviewed the radiological images below and agree with the radiology interpretations.  Ct Angio Head W Or Wo Contrast Ct Angio Neck W Or Wo Contrast 01/28/2017 1. Negative for emergent large vessel occlusion.  2. No significant atherosclerosis in the neck. 3. No intracranial arterial occlusion identified. There is mild or up to moderate distal intracranial arterial (third order branch) irregularity which may be atherosclerosis related. This is most pronounced in the left MCA branches. 4. Stable and negative  CT appearance of the brain.   Ct Head Code Stroke W/o Cm 01/28/2017 1. Stable and normal noncontrast CT appearance of the brain. 2. ASPECTS is 10.   LE venous Doppler - negative for DVT  TTE - Left ventricle: The cavity size was normal. Wall thickness was   increased in a pattern of moderate LVH. Systolic function was   normal. The estimated ejection fraction was in the range of 60%   to 65%. Wall motion was normal; there were no regional wall   motion abnormalities. Doppler parameters are consistent with   abnormal left ventricular relaxation (grade 1 diastolic   dysfunction). - Aortic valve: Moderately calcified annulus. There was mild    regurgitation.  MRI pending   PHYSICAL EXAM  Temp:  [97.9 F (36.6 C)-98.6 F (37 C)] 98 F (36.7 C) (02/16 1039) Pulse Rate:  [80-108] 80 (02/16 1039) Resp:  [16-25] 16 (02/16 1039) BP: (119-174)/(71-140) 166/84 (02/16 1039) SpO2:  [94 %-100 %] 97 % (02/16 1039) Weight:  [229 lb 12.8 oz (104.2 kg)-232 lb 8 oz (105.5 kg)] 232 lb 8 oz (105.5 kg) (02/16 0500)  General - Well nourished, well developed, in no apparent distress.  Ophthalmologic - Fundi not visualized due to noncooperation.  Cardiovascular - Regular rate and rhythm.  Mental Status -  Level of arousal and orientation to age, place, but not orientated to time, or situation. Language exam showed paucity of speech, mild dysarthria, able to follow commands, but perseveration noted.   Cranial Nerves II - XII - II - Visual field intact OU. III, IV, VI - Extraocular movements intact. V - Facial sensation intact bilaterally. VII - left facial droop. VIII - Hearing & vestibular intact bilaterally. X - Palate elevates symmetrically. XI - Chin turning & shoulder shrug intact bilaterally. XII - Tongue protrusion intact.  Motor Strength - The patient's strength was equal and symmetrical in all extremities and pronator drift was absent.  Bulk was normal and fasciculations were absent.   Motor Tone - Muscle tone was assessed at the neck and appendages and was normal.  Reflexes - The patient's reflexes were 1+ in all extremities and he had no pathological reflexes.  Sensory - Light touch, temperature/pinprick were assessed and were symmetrical.    Coordination - The patient had normal movements in the both hands with no ataxia or dysmetria, however, slow on the left.  Tremor was absent, however b/l hand and feet asterixis noted.  Gait and Station - not tested   ASSESSMENT/PLAN [name] is a 76 y.o. 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 He did not receive IV t-PA due to thrombocytopenia.   Encephalopathy   Disorientation, perseveration, asterixis indicating metabolic encephalopathy  Could be due to hypercalcemia, or medication related  Treat underlying cause  Stroke - suspicious right brain infarct, workup underway  Code stroke CT unremarkable. Aspects = 10   CTA head and neck no LVO. mild atherosclerosis  MRI  pending  Lower extremity Doppler  no DVT  2D Echo  EF 60-65%  LDL 63  HgbA1c pending  UDS negative  Lovenox 40 mg sq daily for VTE prophylaxis  DIET DYS 3 Room service appropriate? Yes; Fluid consistency: Nectar Thick  No antithrombotic prior to admission, now on aspirin 325 mg daily.   Patient counseled to be compliant with his antithrombotic medications  Ongoing aggressive stroke  risk factor management  Therapy recommendations:  pending   Disposition:  pending   HTN  BP on the high side  Permissive hypertension (OK if <220/120) for 24-48 hours post stroke and then gradually normalized within 5-7 days.  Long term BP goal normotensive  Tobacco abuse  Current smoker  Smoking cessation counseling provided  Pt is willing to quit  Other Stroke Risk Factors  Advanced age  ETOH use, advised to drink no more than 2 drink(s) a day  Obesity, Body mass index is 33.36 kg/m., recommend weight loss, diet and exercise as appropriate   Other Active Problems  Prostate cancer - following with oncology and so far no metastasis seen  schizophrenia - on depakote PTA  Neutropenia   Thrombocytopenia   Hospital day # 0  Rosalin Hawking, MD PhD Stroke Neurology 01/29/2017 7:56 PM    To contact Stroke Continuity provider, please refer to [email or web address] After hours, contact General Neurology
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
## 2017-01-29 NOTE — Progress Notes (Signed)
  Date: 01/29/2017  Patient name: Henry Smith  Medical record number: [id number]  Date of birth: 12/16/1940   I have seen and evaluated [name] and discussed their care with the Residency Team. In brief, patient is 76 year old male with a past medical history of prostate cancer, thrombocytopenia, parathyroid adenoma with hypercalcemia and schizophrenia who presented with sudden onset left-sided weakness and difficulty speaking. History obtained from the chart as patient is unable to provide a good history. He is currently awake alert and oriented 2.   Per chart patient had sudden onset of a facial droop shortly after eating lunch and was noted to have left-sided weakness by the staff at the group home where he lives. He is also noted to be less responsive than his baseline. In the ED a code stroke was called and he had a CT head which showed no acute intracranial abnormality and he then had a CT angiography of his head and neck which showed no large vessel occlusion. He was noted to have an elevated calcium level of 11.9 on admission.  Today patient is more awake and able to follow simple commands but remains confused.  PMHx, Fam Hx, and/or Soc Hx : As per resident admit note  Vitals:   01/29/17 0830 01/29/17 1039  BP: (!) 167/84 (!) 166/84  Pulse: 84 80  Resp: 16 16  Temp: 98.1 F (36.7 C) 98 F (36.7 C)   Gen.: Awake alert and oriented 2, NAD CVS: Regular rhythm and rhythm, normal sounds Lungs: CTA bilaterally Abdomen: Soft, nontender, normoactive bowel sounds Extremities: No edema Neuro: Patient able to follow simple commands and is oriented 2, strength is 5 out of 5 in his left upper and lower extremity and 4-5 in his right upper and lower extremities and patient remains confused  Assessment and Plan: I have seen and evaluated the patient as outlined above. I agree with the formulated Assessment and Plan as detailed in the residents' note, with the following  changes:   1. Altered mental status: - Presented with left-sided weakness and decreased responsiveness as well as a possible facial droop on admission. Neuro workup is thus far been negative and his weakness appears to have resolved. He remained confused at this time an exact etiology remains uncertain. Given his history of parathyroid adenoma and hypercalcemia which is now worse than his baseline it is conceivable that his symptoms are secondary to his elevated calcium levels. - We'll follow-up MRI today - Continue with aggressive hydration with IV fluids - Follow up repeat BMP - We will consider rechecking his PTH, PTHRP, vitamin D levels - Urine drug screen was negative - HIV was nonreactive and valproic acid level was normal - If his calcium worsens can consider adding calcitonin or starting a bisphosphonate - It is also possible that his symptoms are secondary to his underlying schizophrenia. We will continue with his valproic acid for now   Aldine Contes, MD 2/16/20183:08 PM
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
## 2017-01-29 NOTE — Care Management Note (Signed)
Case Management Note  Patient Details  Name: Henry Smith MRN: [id number] Date of Birth: 09-05-1941  Subjective/Objective:              Patient presented with altered mental status. Resides at  [facility] in [place], [place].  [place] is aware and following. CM will follow for discharge needs pending PT/OT evals and physician orders.      Action/Plan:   Expected Discharge Date:                  Expected Discharge Plan:     In-House Referral:     Discharge planning Services     Post Acute Care Choice:    Choice offered to:     DME Arranged:    DME Agency:     HH Arranged:    HH Agency:     Status of Service:     If discussed at [employer] of Stay Meetings, dates discussed:    Additional Comments:  Rolm Baptise, RN 01/29/2017, 4:16 PM

## 2017-01-29 NOTE — Progress Notes (Signed)
Patient transporting to MRI. CCMD notified. 

## 2017-01-29 NOTE — Progress Notes (Signed)
VASCULAR LAB PRELIMINARY  PRELIMINARY  PRELIMINARY  PRELIMINARY  Bilateral lower extremity venous duplex completed.    Preliminary report:  Bilateral:  No evidence of DVT, superficial thrombosis, or Baker's Cyst.   Marquail Bradwell, RVS 01/29/2017, 4:47 PM

## 2017-01-29 NOTE — Progress Notes (Addendum)
Received a call from patient's court appointed Henry Smith, [phone]). CSW referred him to patient's RN regarding medical information.  Percell Locus Terrance Lanahan LCSWA [phone]

## 2017-01-29 NOTE — Progress Notes (Signed)
Patient arrived to 5M12 from [facility]. Patient with slurred speech and generalized weakness. Alert and oriented x 3 but not to situation.  Q2 vitals started. Telemetry monitor applied. Patient oriented to room, call light and phone.

## 2017-01-29 NOTE — Progress Notes (Signed)
Subjective: The patient was improved from a mental status standpoint when seen on rounds today. He is oriented to person, place and month. He did not know the exact date. He states that he was in no pain. He was unclear of why he was in the hospital. Overall, his mental status was improved although he still seems encephalopathic with some levels of waxing and waning consciousness. It is unclear what his baseline mental status is at his group home and we'll need to investigate this further to understand when he is back to his baseline.  Objective:  Vital signs in last 24 hours: Vitals:   01/29/17 0500 01/29/17 0630 01/29/17 0830 01/29/17 1039  BP:  (!) 160/85 (!) 167/84 (!) 166/84  Pulse:  83 84 80  Resp:  18 16 16   Temp:  98.4 F (36.9 C) 98.1 F (36.7 C) 98 F (36.7 C)  TempSrc:  Oral Oral Oral  SpO2:  94% 95% 97%  Weight: 232 lb 8 oz (105.5 kg)      Physical Exam  Constitutional: He appears well-developed and well-nourished.  Obese  HENT:  Head: Normocephalic and atraumatic.  Poor oral dentition  Eyes: Pupils are equal, round, and reactive to light.  Cardiovascular: Normal rate and regular rhythm.   Respiratory: Effort normal and breath sounds normal.  GI: Soft. Bowel sounds are normal. He exhibits no distension.  Neurological: He is alert.  Patient alert and oriented to self and place but not time. Cranial nerves seemed intact although difficult to formally evaluate. Strength 5 out of 5 in the left upper and lower extremity. Strength 4 out of 5 in the right upper and lower extremity. Some waxing and waning levels of consciousness and interaction during the interview.     Assessment/Plan:  Active Problems:   Altered mental status, unspecified  Patient is a 76 year old male with a past medical history of prostate cancer, schizophrenia, hypercalcemia with potential parathyroid adenoma who presents from a facility with altered mental status.  # Altered mental status The  patient presents from his facility with altered mental status. Code stroke was initially called but neuroimaging thus far has been nonrevealing. His mental status is waxing and waning. He was improved from prior when seen today on rounds. He was able to follow commands. He was alert and oriented to place and person but not the exact date. He did know the month. He is much more able to cooperate with the physical examination. Formal testing did not appreciate any focal neurological deficit. Strength was 5 out of 5 in the left upper and lower extremity and 4 out of 5 in the right upper and right lower extremity. The exact etiology of the patient's altered mental status remains unclear although the differential diagnosis includes stroke, hypercalcemia or other metabolic encephalopathy. At this time I am most concerned about hypercalcemia. It appears the patient did not get his fluid as was written yesterday and thus we are unable to lower his calcium level. Neurology is also concerned for potential stroke and ordered an MRI. We will follow-up with results of that imaging study. We appreciate ongoing recommendations by our neurology colleagues. -- 2 L normal saline over 4 hours, aggressive hydration with 125 mL an hour normal saline following -- CMP tomorrow -- Urinalysis- no evidence of infection. No microscopic hematuria. -- Urine drug screen- negative -- HIV- non-reactive -- RPR -- Valproic acid level- within therapeutic range  # Schizophrenia The patient has a history of schizophrenia which he takes  divalproex. We will check a level to ensure that he is not supratherapeutic on this medication. If he is within normal limits or not supratherapeutic we'll restart this medication. -- Follow-up valproic acid level- within therapeutic range -- Restart divalproex pending above  # Hypertension The patient is currently hypertensive with a blood pressure of 166/84. Given that there is a concern for potential  acute infarct requiring further evaluation by MRI we will hold anti-hypertensive medication at this time and allow for permissive hypertension. -- Permissive hypertension: Blood pressure less than 220/110  DVT/PE prophylaxis: Lovenox FEN/GI: nothing by mouth Code: Full code  Dispo: Anticipated discharge in 1-2 days pending clinical course  Ophelia Shoulder, MD 01/29/2017, 2:09 PM Pager: [phone]

## 2017-01-29 NOTE — Progress Notes (Signed)
Unable to get IV access at this time. IV team to retry on patient. Will administer bolus when IV obtained

## 2017-01-30 DIAGNOSIS — R531 Weakness: Secondary | ICD-10-CM | POA: Diagnosis not present

## 2017-01-30 DIAGNOSIS — A530 Latent syphilis, unspecified as early or late: Secondary | ICD-10-CM | POA: Diagnosis present

## 2017-01-30 DIAGNOSIS — R29810 Facial weakness: Secondary | ICD-10-CM

## 2017-01-30 DIAGNOSIS — I10 Essential (primary) hypertension: Secondary | ICD-10-CM | POA: Diagnosis present

## 2017-01-30 DIAGNOSIS — R4182 Altered mental status, unspecified: Secondary | ICD-10-CM | POA: Diagnosis present

## 2017-01-30 DIAGNOSIS — D709 Neutropenia, unspecified: Secondary | ICD-10-CM | POA: Diagnosis present

## 2017-01-30 DIAGNOSIS — E669 Obesity, unspecified: Secondary | ICD-10-CM | POA: Diagnosis present

## 2017-01-30 DIAGNOSIS — K0889 Other specified disorders of teeth and supporting structures: Secondary | ICD-10-CM | POA: Diagnosis not present

## 2017-01-30 DIAGNOSIS — R4702 Dysphasia: Secondary | ICD-10-CM | POA: Diagnosis present

## 2017-01-30 DIAGNOSIS — G8194 Hemiplegia, unspecified affecting left nondominant side: Secondary | ICD-10-CM | POA: Diagnosis present

## 2017-01-30 DIAGNOSIS — A539 Syphilis, unspecified: Secondary | ICD-10-CM | POA: Diagnosis not present

## 2017-01-30 DIAGNOSIS — D351 Benign neoplasm of parathyroid gland: Secondary | ICD-10-CM | POA: Diagnosis present

## 2017-01-30 DIAGNOSIS — Z79899 Other long term (current) drug therapy: Secondary | ICD-10-CM | POA: Diagnosis not present

## 2017-01-30 DIAGNOSIS — R29710 NIHSS score 10: Secondary | ICD-10-CM | POA: Diagnosis present

## 2017-01-30 DIAGNOSIS — F209 Schizophrenia, unspecified: Secondary | ICD-10-CM | POA: Diagnosis present

## 2017-01-30 DIAGNOSIS — D696 Thrombocytopenia, unspecified: Secondary | ICD-10-CM | POA: Diagnosis present

## 2017-01-30 DIAGNOSIS — Z8546 Personal history of malignant neoplasm of prostate: Secondary | ICD-10-CM | POA: Diagnosis not present

## 2017-01-30 DIAGNOSIS — F17210 Nicotine dependence, cigarettes, uncomplicated: Secondary | ICD-10-CM | POA: Diagnosis present

## 2017-01-30 DIAGNOSIS — G9341 Metabolic encephalopathy: Secondary | ICD-10-CM | POA: Diagnosis present

## 2017-01-30 DIAGNOSIS — Z9889 Other specified postprocedural states: Secondary | ICD-10-CM | POA: Diagnosis not present

## 2017-01-30 DIAGNOSIS — Z8619 Personal history of other infectious and parasitic diseases: Secondary | ICD-10-CM | POA: Diagnosis not present

## 2017-01-30 DIAGNOSIS — Z6833 Body mass index (BMI) 33.0-33.9, adult: Secondary | ICD-10-CM | POA: Diagnosis not present

## 2017-01-30 LAB — GLUCOSE, CAPILLARY: GLUCOSE-CAPILLARY: 84 mg/dL (ref 65–99)

## 2017-01-30 LAB — COMPREHENSIVE METABOLIC PANEL
ALBUMIN: 2.8 g/dL — AB (ref 3.5–5.0)
ALT: 166 U/L — ABNORMAL HIGH (ref 17–63)
AST: 172 U/L — AB (ref 15–41)
Alkaline Phosphatase: 46 U/L (ref 38–126)
Anion gap: 6 (ref 5–15)
BUN: 9 mg/dL (ref 6–20)
CHLORIDE: 109 mmol/L (ref 101–111)
CO2: 27 mmol/L (ref 22–32)
Calcium: 10.7 mg/dL — ABNORMAL HIGH (ref 8.9–10.3)
Creatinine, Ser: 0.74 mg/dL (ref 0.61–1.24)
GFR calc Af Amer: >60 mL/min (ref >60)
GLUCOSE: 88 mg/dL (ref 65–99)
POTASSIUM: 3.8 mmol/L (ref 3.5–5.1)
SODIUM: 142 mmol/L (ref 135–145)
Total Bilirubin: 0.6 mg/dL (ref 0.3–1.2)
Total Protein: 5.6 g/dL — ABNORMAL LOW (ref 6.5–8.1)

## 2017-01-30 LAB — HEMOGLOBIN A1C
HEMOGLOBIN A1C: 5.3 % (ref 4.8–5.6)
MEAN PLASMA GLUCOSE: 105 mg/dL

## 2017-01-30 MED ORDER — SODIUM PHOSPHATES 45 MMOLE/15ML IV SOLN
40.0000 meq | Freq: Once | INTRAVENOUS | Status: AC
  Administered 2017-01-30: 40 meq via INTRAVENOUS
  Filled 2017-01-30: qty 10

## 2017-01-30 MED ORDER — SODIUM CHLORIDE 0.9 % IV SOLN
INTRAVENOUS | Status: AC
  Administered 2017-01-30: 19:00:00 via INTRAVENOUS

## 2017-01-30 MED ORDER — CALCITONIN (SALMON) 200 UNIT/ML IJ SOLN
4.0000 [IU]/kg | Freq: Once | INTRAMUSCULAR | Status: AC
  Administered 2017-01-30: 348 [IU] via INTRAMUSCULAR
  Filled 2017-01-30: qty 1.74

## 2017-01-30 MED ORDER — DIVALPROEX SODIUM ER 500 MG PO TB24
1250.0000 mg | ORAL_TABLET | Freq: Every day | ORAL | Status: DC
  Administered 2017-01-30 – 2017-02-02 (×4): 1250 mg via ORAL
  Filled 2017-01-30 (×6): qty 1

## 2017-01-30 NOTE — Progress Notes (Signed)
STROKE TEAM PROGRESS NOTE   HISTORY OF PRESENT ILLNESS (per record) Henry Smith is an 76 y.o. male who presented to the ED from his group home with sudden onset left arm and leg weakness and difficulty speaking. His symptoms occurred while he was eating lunch and was doing well earlier today. Per review of the chart, he is followed by Oncology for localized prostate cancer, last seen 12/21/16, and referred to Endocrinology for hypercalcemia but was subsequently found to have US findings suggestive of parathyroid adenoma. Bone scan at that time showed some increased uptake in the calvarium though unsure if neoplastic vs inflammatory. In the ED, head CT was without any acute findings. Preliminary findings of his CT angiography were without findings of an acute thrombosis. He was last known well 01/28/2017 at 11:15 AM. Patient was not administered IV t-PA secondary to thrombocytopenia. He was admitted for further evaluation and treatment.   SUBJECTIVE (INTERVAL HISTORY) No family at bedside.Sitting in chair. Pleasant. Left facial droop.   OBJECTIVE Temp:  [98 F (36.7 C)-98.8 F (37.1 C)] 98.5 F (36.9 C) (02/17 0522) Pulse Rate:  [57-84] 57 (02/17 0522) Cardiac Rhythm: Sinus bradycardia (02/17 0448) Resp:  [16-20] 20 (02/17 0522) BP: (144-167)/(76-95) 144/95 (02/17 0522) SpO2:  [95 %-100 %] 97 % (02/17 0522) Weight:  [107.4 kg (236 lb 11.2 oz)] 107.4 kg (236 lb 11.2 oz) (02/17 0500)  CBC:   Recent Labs Lab 01/28/17 1304 01/28/17 1313 01/29/17 0338  WBC 3.4*  --  2.6*  NEUTROABS 2.4  --   --   HGB 15.2 17.0 14.1  HCT 47.7 50.0 45.1  MCV 89.2  --  89.0  PLT 93*  --  83*    Basic Metabolic Panel:   Recent Labs Lab 01/29/17 0712 01/29/17 1438 01/30/17 0436  NA  --  141 142  K  --  4.0 3.8  CL  --  110 109  CO2  --  27 27  GLUCOSE  --  92 88  BUN  --  11 9  CREATININE  --  0.85 0.74  CALCIUM  --  11.0* 10.7*  PHOS 2.0*  --   --     Lipid Panel:     Component Value  Date/Time   CHOL 125 01/29/2017 0712   TRIG 86 01/29/2017 0712   HDL 45 01/29/2017 0712   CHOLHDL 2.8 01/29/2017 0712   VLDL 17 01/29/2017 0712   LDLCALC 63 01/29/2017 0712   HgbA1c:  Lab Results  Component Value Date   HGBA1C 5.3 01/29/2017   Urine Drug Screen:     Component Value Date/Time   LABOPIA NONE DETECTED 01/28/2017 0807   COCAINSCRNUR NONE DETECTED 01/28/2017 0807   COCAINSCRNUR NONE DETECTED 10/24/2016 2000   LABBENZ NONE DETECTED 01/28/2017 0807   AMPHETMU NONE DETECTED 01/28/2017 0807   THCU NONE DETECTED 01/28/2017 0807   LABBARB NONE DETECTED 01/28/2017 0807      IMAGING I have personally reviewed the radiological images below and agree with the radiology interpretations.  Ct Angio Head W Or Wo Contrast Ct Angio Neck W Or Wo Contrast 01/28/2017 1. Negative for emergent large vessel occlusion.  2. No significant atherosclerosis in the neck. 3. No intracranial arterial occlusion identified. There is mild or up to moderate distal intracranial arterial (third order branch) irregularity which may be atherosclerosis related. This is most pronounced in the left MCA branches. 4. Stable and negative CT appearance of the brain.   Ct Head Code Stroke W/o Cm 01/28/2017  1. Stable and normal noncontrast CT appearance of the brain. 2. ASPECTS is 10.   LE venous Doppler - negative for DVT  TTE - Left ventricle: The cavity size was normal. Wall thickness was   increased in a pattern of moderate LVH. Systolic function was   normal. The estimated ejection fraction was in the range of 60%   to 65%. Wall motion was normal; there were no regional wall   motion abnormalities. Doppler parameters are consistent with   abnormal left ventricular relaxation (grade 1 diastolic   dysfunction). - Aortic valve: Moderately calcified annulus. There was mild   regurgitation.  MRI brain 01/29/2017:: 1. Markedly motion degraded examination. 2. Within the above limitation, no acute  intracranial abnormality.   PHYSICAL EXAM  Temp:  [98 F (36.7 C)-98.8 F (37.1 C)] 98.5 F (36.9 C) (02/17 0522) Pulse Rate:  [57-84] 57 (02/17 0522) Resp:  [16-20] 20 (02/17 0522) BP: (144-167)/(76-95) 144/95 (02/17 0522) SpO2:  [95 %-100 %] 97 % (02/17 0522) Weight:  [107.4 kg (236 lb 11.2 oz)] 107.4 kg (236 lb 11.2 oz) (02/17 0500)  Exam is stable today, no changes  General - Well nourished, well developed, in no apparent distress.  Ophthalmologic - Fundi not visualized due to noncooperation.  Cardiovascular - Regular rate and rhythm.  Mental Status -  Level of arousal and orientation to age, place, but not orientated to time, or situation. Language exam showed paucity of speech, mild dysarthria, able to follow commands, but perseveration noted.   Cranial Nerves II - XII - II - Visual field intact OU. III, IV, VI - Extraocular movements intact. V - Facial sensation intact bilaterally. VII - left facial droop. VIII - Hearing & vestibular intact bilaterally. X - Palate elevates symmetrically. XI - Chin turning & shoulder shrug intact bilaterally. XII - Tongue protrusion intact.  Motor Strength - The patient's strength was equal and symmetrical in all extremities and pronator drift was absent.  Bulk was normal and fasciculations were absent.   Motor Tone - Muscle tone was assessed at the neck and appendages and was normal.  Reflexes - The patient's reflexes were 1+ in all extremities and he had no pathological reflexes.  Sensory - Light touch, temperature/pinprick were assessed and were symmetrical.    Coordination - The patient had normal movements in the both hands with no ataxia or dysmetria, however, slow on the left.  Tremor was absent, however b/l hand and feet asterixis noted but no drift.   Gait and Station - not tested   ASSESSMENT/PLAN Mr. Demitrus Harpin is a 76 y.o. 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 He did not receive IV t-PA due to thrombocytopenia.   Encephalopathy   Disorientation, perseveration, asterixis indicating metabolic encephalopathy  Could be due to hypercalcemia, or medication related  Treat underlying cause  Stroke - suspicious right brain infarct, workup underway  Code stroke CT unremarkable. Aspects = 10   CTA head and neck no LVO. mild atherosclerosis  MRI  Showed no acute pathology but significantly motion degraded  Lower extremity Doppler  no DVT  2D Echo  EF 60-65%  LDL 63  HgbA1c 5.3  UDS negative  Lovenox 40 mg sq daily for VTE prophylaxis DIET DYS 3 Room service appropriate? Yes; Fluid consistency: Nectar Thick  No antithrombotic prior to admission, now on aspirin 325 mg daily. May be  discharged on ASA 81mg  for stroke prevention.  Patient counseled to be compliant with his antithrombotic medications  Ongoing aggressive stroke risk factor management  Therapy recommendations:  pending   Disposition:  pending   HTN  BP on the high side  Permissive hypertension (OK if <220/120) for 24-48 hours post stroke and then gradually normalized within 5-7 days.  Long term BP goal normotensive  Tobacco abuse  Current smoker  Smoking cessation counseling provided  Pt is willing to quit  Other Stroke Risk Factors  Advanced age  ETOH use, advised to drink no more than 2 drink(s) a day  Obesity, Body mass index is 33.96 kg/m., recommend weight loss, diet and exercise as appropriate   Other Active Problems  Prostate cancer - following with oncology and so far no metastasis seen  schizophrenia - on depakote PTA  Neutropenia   Thrombocytopenia    Personally examined patient and images, and have participated in and made any corrections needed to history, physical, neuro exam,assessment and plan as stated above.  I have  personally obtained the history, evaluated lab date, reviewed imaging studies and agree with radiology interpretations.   Stroke team will sign off at this time. Do not hesitate to call with any questions or to re-consult [place] if needed. Thank you.    Sarina Ill, MD Stroke Neurology [facility]     To contact Stroke Continuity provider, please refer to [email or web address] After hours, contact General Neurology

## 2017-01-30 NOTE — Progress Notes (Signed)
   Subjective:  Patient seen walking down the hall with assistance from nursing while using a walker. He is more conversational today and follows commands. He is able to tell me his name, that he is in [facility], but does not know the year or month. He does say he has issues with long-term memory. I did speak to his legal guardian, [name], who last saw patient about 3 weeks ago. He states that normally patient is physically able to walk on his own without assistance with an average pace. He says patient is generally reserved/calm but does not have slurred speech at baseline.   Objective:  Vital signs in last 24 hours: Vitals:   01/30/17 0522 01/30/17 0901 01/30/17 1307 01/30/17 1738  BP: (!) 144/95 (!) 154/79 125/63 (!) 142/62  Pulse: (!) 57 85 84 85  Resp: 20 18 20 18   Temp: 98.5 F (36.9 C) 98.8 F (37.1 C) 98.7 F (37.1 C) 98.4 F (36.9 C)  TempSrc: Oral Oral Oral Oral  SpO2: 97% 95% 96% 94%  Weight:       General: patient seen walking down hall with slow gait Cardiac: RRR Pulm: clear to auscultation bilaterally Abd: soft, nontender, nondistended, BS present Ext: warm and well perfused, no pedal edema Neuro: alert and oriented to person and place, generalized weakness with strength 4/5 in all extremities, facial muscle weakness with smile, follows commands, speech slightly slurred and slow   Assessment/Plan:  Active Problems:   Schizophrenia (HCC)   Hypercalcemia   Altered mental status, unspecified   Generalized weakness  Patient is a 76 year old male with a past medical history of prostate cancer, schizophrenia, hypercalcemia with potential parathyroid adenoma who presents from a facility with altered mental status.  # Altered mental status # Generalized Weakness # Hypercalcemia Patient is much more able to cooperate with the physical examination is more communicative. Formal testing did not appreciate any focal neurological deficit. He has generalized  weakness. The exact etiology of the patient's altered mental status remains unclear. MRI was motion degraded but without evidence of stroke. Neurology has signed off without additional recommendations. We are attempting to lower his calcium with fluids however have not made much progress when accounting for the corrected calcium level. Clinically, patient is slowly progressing with current management. Will add calcitonin once. Patient's RPR and T Pallidum Abs are positive. Unclear if patient had previous known and/or treated syphilis. Will need to consider LP to assess CSF in this patient with syphilis of unknown duration.  -- NS @ 125 mL/hr for 10 hours -- Calcitonin 4 units/kg once -- CMP in AM -- Phos 2.0 - will replete and recheck in AM -- Urinalysis- no evidence of infection. No microscopic hematuria. -- Urine drug screen- negative -- HIV- non-reactive -- RPR with 1:2 ratio and Positive Treponemal Pallidum Abs - will consider LP -- Valproic acid level- within therapeutic range  # Schizophrenia The patient has a history of schizophrenia which he takes divalproex. -- valproic acid level- within therapeutic range -- Continue home divalproex   DVT/PE prophylaxis:Lovenox FEN/GI:Dysphagia 3 Code:Full code  Dispo: Anticipated discharge in 1-3 days pending clinical course   Zada Finders, MD 01/30/2017, 6:33 PM

## 2017-01-30 NOTE — Progress Notes (Signed)
Speech Language Pathology Treatment: Dysphagia  Patient Details Name: Henry Smith MRN: [id number] DOB: October 04, 1941 Today's Date: 01/30/2017 Time: [id number] SLP Time Calculation (min) (ACUTE ONLY): 19 min  Assessment / Plan / Recommendation Clinical Impression  Patient seen for dysphagia treatment. Multiple family members at bedside who express concern regarding stroke (MRI with no acute findings, limited to due motion artifact) given facial droop and they report memory deficits not present prior to hospitalization. Observed patient with nectar-thick liquids; no overt signs of aspiration noted, and swallow appears timely. Administered upgraded texture trials of thin liquids, regular solids. Patient is noted with immediate cough x1 following presentation of thin liquids, suspect decreased airway protection and swallow delay. Provided education to patient and family regarding aspiration precautions, swallow physiology. Recommend continuing current diet with nectar thick liquids. SLP will continue to follow; patient may benefit from instrumental exam to aid in determining safest, least restrictive diet. He may also benefit from cognitive-linguistic evaluation.   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 CT Head negative for acute findings, MRI pending.      SLP Plan  Continue with current plan of care     Recommendations  Diet recommendations: Dysphagia 3 (mechanical soft);Nectar-thick liquid Liquids provided via: Cup;Straw Medication Administration: Whole meds with puree Supervision: Patient able to self feed;Full supervision/cueing for compensatory strategies Compensations: Slow rate;Minimize environmental distractions;Small sips/bites                Oral Care  Recommendations: Oral care BID Plan: Continue with current plan of care       GO           Functional Assessment Tool Used: skilled clinical judgment Functional Limitations: Swallowing Swallow Current Status [id number]): At least 20 percent but less than 40 percent impaired, limited or restricted Swallow Goal Status [phone]): At least 1 percent but less than 20 percent impaired, limited or restricted   Deneise Lever, [place] CF-SLP Speech-Language Pathologist [phone] Aliene Altes 01/30/2017, 5:52 PM

## 2017-01-31 DIAGNOSIS — R410 Disorientation, unspecified: Secondary | ICD-10-CM

## 2017-01-31 DIAGNOSIS — F209 Schizophrenia, unspecified: Secondary | ICD-10-CM

## 2017-01-31 DIAGNOSIS — Z8546 Personal history of malignant neoplasm of prostate: Secondary | ICD-10-CM

## 2017-01-31 DIAGNOSIS — Z8619 Personal history of other infectious and parasitic diseases: Secondary | ICD-10-CM

## 2017-01-31 DIAGNOSIS — R531 Weakness: Secondary | ICD-10-CM

## 2017-01-31 DIAGNOSIS — Z79899 Other long term (current) drug therapy: Secondary | ICD-10-CM

## 2017-01-31 DIAGNOSIS — R4182 Altered mental status, unspecified: Secondary | ICD-10-CM

## 2017-01-31 DIAGNOSIS — A539 Syphilis, unspecified: Secondary | ICD-10-CM

## 2017-01-31 LAB — PHOSPHORUS: Phosphorus: 2.9 mg/dL (ref 2.5–4.6)

## 2017-01-31 LAB — COMPREHENSIVE METABOLIC PANEL
ALBUMIN: 3.2 g/dL — AB (ref 3.5–5.0)
ALK PHOS: 56 U/L (ref 38–126)
ALT: 147 U/L — AB (ref 17–63)
ANION GAP: 7 (ref 5–15)
AST: 110 U/L — AB (ref 15–41)
BUN: 8 mg/dL (ref 6–20)
CALCIUM: 10.8 mg/dL — AB (ref 8.9–10.3)
CO2: 27 mmol/L (ref 22–32)
Chloride: 110 mmol/L (ref 101–111)
Creatinine, Ser: 0.85 mg/dL (ref 0.61–1.24)
GFR calc Af Amer: >60 mL/min (ref >60)
GFR calc non Af Amer: >60 mL/min (ref >60)
GLUCOSE: 110 mg/dL — AB (ref 65–99)
Potassium: 4.1 mmol/L (ref 3.5–5.1)
Sodium: 144 mmol/L (ref 135–145)
Total Bilirubin: 0.8 mg/dL (ref 0.3–1.2)
Total Protein: 6.4 g/dL — ABNORMAL LOW (ref 6.5–8.1)

## 2017-01-31 LAB — GLUCOSE, CAPILLARY: Glucose-Capillary: 126 mg/dL — ABNORMAL HIGH (ref 65–99)

## 2017-01-31 MED ORDER — SODIUM CHLORIDE 0.9 % IV SOLN
INTRAVENOUS | Status: AC
  Administered 2017-01-31: 15:00:00 via INTRAVENOUS

## 2017-01-31 NOTE — Progress Notes (Signed)
   Subjective:  Patient continues to progress slowly. He is more interactive and conversational. He feels he is slowly gaining his strength back. He is able to tell me his name, but thinks he is in [facility]. When asked the year, he thinks it is 36 and disagrees when explained the current year is 2018. He does report a remote history of Syphilis which was supposedly treated, but this is unclear.   Objective:  Vital signs in last 24 hours: Vitals:   01/31/17 0231 01/31/17 0500 01/31/17 0627 01/31/17 0911  BP: (!) 145/78  (!) 160/79 (!) 160/92  Pulse: 71  70 65  Resp: 18  18 18   Temp: 98 F (36.7 C)  98.6 F (37 C) 98.4 F (36.9 C)  TempSrc: Oral  Oral Oral  SpO2: 98%  96% 98%  Weight:  229 lb 7 oz (104.1 kg)     General: patient resting in bed, no acute distress Cardiac: RRR Pulm: clear to auscultation bilaterally Abd: soft, nontender, nondistended, BS present Ext: warm and well perfused, no pedal edema Neuro: alert and oriented to person only, generalized weakness, speech slightly slurred and slow   Assessment/Plan:  Active Problems:   Schizophrenia (HCC)   Hypercalcemia   Altered mental status, unspecified   Generalized weakness   Syphilis  Patient is a 76 year old male with a past medical history of prostate cancer, schizophrenia, hypercalcemia with potential parathyroid adenoma who presents from a facility with altered mental status.  # Altered mental status # Generalized Weakness # Hypercalcemia # Syphilis Patient continues to slowly improve in terms of interaction and strenght. Formal testing did not appreciate any focal neurological deficit. He has generalized weakness. The exact etiology of the patient's altered mental status remains unclear. MRI was motion degraded but without evidence of stroke. Neurology has signed off without additional recommendations. Patient's RPR and T Pallidum Abs are positive. Given patient's syphilis of unknown duration and  presentation of altered mental status, we will purse a lumbar puncture to assess for neurosyphilis as this will alter further management. -- NS @ 125 mL/hr for 10 hours -- Calcitonin 4 units/kg given once -- CMP in AM -- Phos - repleted -- Urinalysis- no evidence of infection. No microscopic hematuria. -- Urine drug screen- negative -- HIV- non-reactive -- RPR with 1:2 ratio and Positive Treponemal Pallidum Abs - will pursue LP -- Valproic acid level- within therapeutic range -- Continue PT and SLP   # Schizophrenia The patient has a history of schizophrenia which he takes divalproex. -- valproic acid level- within therapeutic range -- Continue home divalproex   DVT/PE prophylaxis:Lovenox FEN/GI:Dysphagia 3 Code:Full code  Dispo: Anticipated discharge in 1-3 days pending clinical course   Zada Finders, MD 01/31/2017, 12:21 PM

## 2017-01-31 NOTE — Progress Notes (Signed)
Internal Medicine Attending  Date: 01/31/2017  Patient name: Henry Smith Medical record number: [id number] Date of birth: 11-18-1941 Age: 76 y.o. Gender: male  I saw and evaluated the patient. I reviewed the resident's note by Dr. Posey Pronto and I agree with the resident's findings and plans as documented in his progress note.  When seen on rounds this morning [name] was oriented to person but not to place and time. He was interactive and answered questions to the best of his ability. He became frustrated with his inability to answer all of the questions and was tearful briefly. Interestingly, his RPR was positive at 1:2 and the reflex antitrepinemal antibody was also positive suggesting previous syphilis infection. He did state in the past he had syphilis and received therapy although his current mental status and orientation make the varacity of this statement somewhat sketchy. We had hoped to arrange for a lumbar puncture in interventional radiology, but apparently the policy is that an attempt needs to be made on the floor first. We will try to arrange a bedside ultrasound assisted lumbar puncture in the next day or two to further assess the possibility of neurosyphilis contributing to his mental status changes. We will also consider consultation to non-stroke neurology as the stroke neurology team has signed off with the ruling out of a stroke.

## 2017-01-31 NOTE — Progress Notes (Signed)
Henry Smith, xray notified this staff order for lumbar puncture must be done on floor first before performed in radiology. MD made aware.

## 2017-02-01 DIAGNOSIS — R4182 Altered mental status, unspecified: Secondary | ICD-10-CM | POA: Diagnosis not present

## 2017-02-01 LAB — COMPREHENSIVE METABOLIC PANEL
ALT: 104 U/L — AB (ref 17–63)
AST: 59 U/L — AB (ref 15–41)
Albumin: 3 g/dL — ABNORMAL LOW (ref 3.5–5.0)
Alkaline Phosphatase: 46 U/L (ref 38–126)
Anion gap: 6 (ref 5–15)
BUN: 8 mg/dL (ref 6–20)
CHLORIDE: 114 mmol/L — AB (ref 101–111)
CO2: 26 mmol/L (ref 22–32)
CREATININE: 0.77 mg/dL (ref 0.61–1.24)
Calcium: 10.6 mg/dL — ABNORMAL HIGH (ref 8.9–10.3)
GFR calc Af Amer: >60 mL/min (ref >60)
Glucose, Bld: 91 mg/dL (ref 65–99)
Potassium: 4 mmol/L (ref 3.5–5.1)
Sodium: 146 mmol/L — ABNORMAL HIGH (ref 135–145)
Total Bilirubin: 0.8 mg/dL (ref 0.3–1.2)
Total Protein: 6.1 g/dL — ABNORMAL LOW (ref 6.5–8.1)

## 2017-02-01 LAB — GLUCOSE, CAPILLARY: Glucose-Capillary: 94 mg/dL (ref 65–99)

## 2017-02-01 LAB — CSF CELL COUNT WITH DIFFERENTIAL
RBC Count, CSF: 43 /mm3 — ABNORMAL HIGH
TUBE #: 1
WBC, CSF: 1 /mm3 (ref 0–5)

## 2017-02-01 LAB — PROTEIN AND GLUCOSE, CSF
Glucose, CSF: 66 mg/dL (ref 40–70)
TOTAL PROTEIN, CSF: 36 mg/dL (ref 15–45)

## 2017-02-01 MED ORDER — SODIUM CHLORIDE 0.9 % IV SOLN
INTRAVENOUS | Status: AC
  Administered 2017-02-01: 10:00:00 via INTRAVENOUS

## 2017-02-01 NOTE — Progress Notes (Signed)
LCSW following for disposition. Per notes, appears patient is from group home: [facility]. Guardian was called and message left regarding plans and updates. Group Home also contacted with regards to plans, care of patient and what they can manage due to recommendation being SNF.  Awaiting calls back with regards to disposition and LCSW will continue to follow and assist with needs.   Lane Hacker, MSW Clinical Social Work: [employer] Coverage for :  [phone]

## 2017-02-01 NOTE — Clinical Social Work Note (Signed)
Clinical Social Work Assessment  Patient Details  Name: Henry Smith MRN: [id number] Date of Birth: 11-10-1941  Date of referral:  02/01/17               Reason for consult:  Facility Placement, Discharge Planning                Permission sought to share information with:  Case Manager, Customer service manager, Guardian Permission granted to share information::  Yes, Verbal Permission Granted  Name::        Agency::  [place]  Relationship::  Henry Smith (patient is a ward of state)  Contact Information:     Housing/Transportation Living arrangements for the past 2 months:  [place] of Information:  Medical Team, Case Manager, [place], Facility Patient Interpreter Needed:  None Criminal Activity/Legal Involvement Pertinent to Current Situation/Hospitalization:  No - Comment as needed Significant Relationships:  Siblings, [employer] Lives with:  Facility Resident Do you feel safe going back to the place where you live?  No Need for family participation in patient care:  Yes (Comment) (patient has a legal guardian)  Care giving concerns:  LCSW spoke with patient's legal guardian (ward of state:  Contracted with [facility]).  Gurardian reports he is not at baseline currently as prior he was up walking, indpendent with all ADLs at group home.  LCSW discussed recommendations and needs and guardian feels patient needs a ST SNF at [place] to work towards getting back to baseline.  Patient slow with progression but team reports he is getting his strength back as well as walking 65 ft. With walker.   Social Worker assessment / plan:  LCSW completed assessment with guardian and discussed recommendations.  Guardian reports he does have a brother who is involved, but all decisions come through the guardian.  Placement is what guardian is agreeable to for ST SNF.  Reports this will be the first time he will go to SNF and LCSW  explained process and work up.  Agreeable for bed search in [place].  LCSW will complete SNF work up and 30 day passar due to mental health hx.  Will follow up with guardian and placement considerations.    Employment status:  Disabled (Comment on whether or not currently receiving Disability) Insurance information:  Medicare, Medicaid In [place] PT Recommendations:  [place] / Referral to community resources:  [place]  Patient/Family's Response to care:  Agreeable to plan  Patient/Family's Understanding of and Emotional Response to Diagnosis, Current Treatment, and Prognosis:  Guardian voices concern due to patient not at baseline and asked appropriate questions regarding his care.   Emotional Assessment Appearance:  Appears stated age Attitude/Demeanor/Rapport:    Affect (typically observed):  Accepting, Adaptable Orientation:  Oriented to Self, Oriented to Place Alcohol / Substance use:    Psych involvement (Current and /or in the community):  Yes (Comment), Outpatient Provider [facility], chronic mental health: Schizophrenia)  Discharge Needs  Concerns to be addressed:  No discharge needs identified Readmission within the last 30 days:  Yes Current discharge risk:  None Barriers to Discharge:  Continued Medical Work up (Will Technical sales engineer, working on a thirty day passar)   [place], LCSW 02/01/2017, 12:47 PM

## 2017-02-01 NOTE — Progress Notes (Signed)
Internal Medicine Attending:   I saw and examined the patient. I reviewed the resident's note and I agree with the resident's findings and plan as documented in the resident's note.  Patient feels well with no new complaints. His mental status has improved slightly since admission and he is able to follow commands and is oriented to person place but does not know the current date. The etiology of his altered mental status remains uncertain at this point. MRI and CT with no evidence of an acute CVA. His hypercalcemia has improved to 10.6 with a corrected calcium of approximately 11. Given his low titer positive RPR (1:2 ratio) and a positive treponemal antibody it is a possibility his altered mental status is secondary tertiary syphilis but this appears unlikely. He does state that he has a history syphilis but is uncertain if he had treatment for this in the past. We will attempt to obtain a bedside lumbar puncture today and follow-up CSF for VDRL.

## 2017-02-01 NOTE — NC FL2 (Signed)
[place] MEDICAID FL2 LEVEL OF CARE SCREENING TOOL     IDENTIFICATION  Patient Name: Henry Smith Birthdate: [birth year] Sex: male Admission Date (Current Location): 01/28/2017  [facility] and [place] Number:  Herbalist and Address:  [facility] [place], [place] [address] [place], [place] [address]      Provider Number: [id number]  Attending Physician Name and Address:  Aldine Contes, MD  Relative Name and Phone Number:  Legal Guardian:  Ward of State:  [place]:  [name] [phone]    Current Level of Care: Hospital Recommended Level of Care: [place] Prior Approval Number:    Date Approved/Denied:   PASRR Number:  pending  Discharge Plan: SNF    Current Diagnoses: Patient Active Problem List   Diagnosis Date Noted  . Syphilis 01/31/2017  . Disorientation   . Generalized weakness 01/30/2017  . Altered mental status, unspecified 01/28/2017  . Hypercalcemia 11/01/2016  . Prostate cancer ([place]) 10/30/2016  . Leukopenia 10/30/2016  . Thrombocytopenia ([place]) 10/30/2016  . Schizophrenia ([place]) 10/26/2016    Orientation RESPIRATION BLADDER Height & Weight     Self, Situation, Place  Normal Incontinent Weight: 226 lb 4 oz (102.6 kg) Height:     BEHAVIORAL SYMPTOMS/MOOD NEUROLOGICAL BOWEL NUTRITION STATUS   stable, no behaviors   Incontinent Diet (See DC summary)  AMBULATORY STATUS COMMUNICATION OF NEEDS Skin   Extensive Assist Verbally Normal                       Personal Care Assistance Level of Assistance  Bathing, Feeding, Dressing Bathing Assistance: Limited assistance Feeding assistance: Limited assistance Dressing Assistance: Limited assistance     Functional Limitations Info  Sight, Hearing, Speech Sight Info: Adequate Hearing Info: Adequate Speech Info: Adequate    SPECIAL CARE FACTORS FREQUENCY  PT (By licensed PT), OT (By licensed OT), Speech therapy     PT Frequency: 5x OT  Frequency: 5x     Speech Therapy Frequency: 5x      Contractures Contractures Info: Not present    Additional Factors Info  Code Status, Allergies, Psychotropic Code Status Info: Full Code Allergies Info: NKA Psychotropic Info: see med list         Current Medications (02/01/2017):  This is the current hospital active medication list Current Facility-Administered Medications  Medication Dose Route Frequency Provider Last Rate Last Dose  . 0.9 %  sodium chloride infusion   Intravenous Continuous Ophelia Shoulder, MD 125 mL/hr at 02/01/17 0931    . acetaminophen (TYLENOL) tablet 650 mg  650 mg Oral Q6H PRN Zada Finders, MD   650 mg at 01/30/17 [id number]   Or  . acetaminophen (TYLENOL) suppository 650 mg  650 mg Rectal Q6H PRN Zada Finders, MD      . aspirin EC tablet 325 mg  325 mg Oral Daily Rosalin Hawking, MD   325 mg at 02/01/17 0930  . divalproex (DEPAKOTE ER) 24 hr tablet 1,250 mg  1,250 mg Oral QHS Oval Linsey, MD   1,250 mg at 01/31/17 2102  . enoxaparin (LOVENOX) injection 40 mg  40 mg Subcutaneous Q24H Zada Finders, MD   40 mg at 01/31/17 2101  . sodium chloride flush (NS) 0.9 % injection 3 mL  3 mL Intravenous Q12H Zada Finders, MD   3 mL at 02/01/17 0931     Discharge Medications: Please see discharge summary for a list of discharge medications.  Relevant Imaging Results:  Relevant Lab Results:  Additional Information SSN:  [id number]     Lilly Cove, [place]

## 2017-02-01 NOTE — Progress Notes (Addendum)
  Speech Language Pathology Treatment: Dysphagia  Patient Details Name: Lanny Vonwald MRN: [id number] DOB: [birth year] Today's Date: 02/01/2017 Time: [id number] SLP Time Calculation (min) (ACUTE ONLY): 12 min  Assessment / Plan / Recommendation Clinical Impression  Pt continues to have immediate and delayed coughing that follows advanced trials of thin liquids, even with Mod cueing for smaller, single cup sips. He denies any h/o dysphagia PTA.  Recommend proceeding with MBS to better assess swallowing function, continuing current diet orders until that time. Per radiology, cannot be done prior to LP due to barium impacting visibility for that procedure. Will proceed with MBS after LP is performed.   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 CT Head negative for acute findings, MRI pending.      SLP Plan  MBS       Recommendations  Diet recommendations: Dysphagia 3 (mechanical soft);Nectar-thick liquid Liquids provided via: Cup;Straw Medication Administration: Whole meds with puree Supervision: Patient able to self feed;Full supervision/cueing for compensatory strategies Compensations: Slow rate;Minimize environmental distractions;Small sips/bites Postural Changes and/or Swallow Maneuvers: Seated upright 90 degrees;Upright 30-60 min after meal                Oral Care Recommendations: Oral care BID Follow up Recommendations: Skilled Nursing facility Plan: MBS       GO                Germain Osgood 02/01/2017, 11:41 AM  Germain Osgood, M.A. CCC-SLP [phone]

## 2017-02-01 NOTE — Procedures (Signed)
Lumbar Puncture Procedure Note  Pre-operative Diagnosis:  Altered mental status  Indications:  Altered mental status  Procedure Details:  Informed consent was obtained after explanation of the risks and benefits of the procedure, refer to the consent documentation.  Time-out performed immediately prior to the procedure.  Patient was placed in the seated upright positioning.  The superior aspect of the iliac crests were identified, with the traverse demarcating the L4-L5 interspace.  A bedside ultrasound was used to help identify the spinous processes at L4-L5  and the intervertebral space was located and marked.  This area was prepped and draped in the usual sterile fashion. Maximum sterile technique was used including antiseptics, cap, gloves, gown, hand hygiene, mask, and sterile sheet.  Local anesthesia with 1% lidocaine was applied subcutaneously then deep to the skin. The spinal needle with trocar was introduced with frequent removal of the trocar to evaluate for cerebrospinal fluid. When this fluid was noted samples were collected in four separate tubes and sent to the lab after proper labeling. The spinal needle with trocar was removed, with minimal bleeding noted upon removal. A sterile bandage was placed over the puncture site after holding pressure.  A spinal needle was inserted at the L4 - L5 interspace.   Findings: 37mL of clear spinal fluid was obtained. Tubes 1-4 were sent for protein, glucose, cell count and differential, VDRL, Gram stain and culture and cytology  Opening pressure not obtained        Condition:   The patient tolerated the procedure well and remains in the same condition as pre-procedure.  Complications: None; patient tolerated the procedure well.  Plan: Pt to remain supine for 1 hour.

## 2017-02-01 NOTE — Progress Notes (Addendum)
   Subjective:  No acute events overnight. Patient was resting comfortably in bed today. He was able to have a conversation with me but was unable to formally answer many orientation questions. He is alert, oriented to person and place. He knows his birth date. He does not know the current date. It is clear that when I attempt to have ongoing conversations with him and he is unable to answer he is visibly frustrated. He did state that overall he was feeling okay and was not in any pain. Objective:  Vital signs in last 24 hours: Vitals:   01/31/17 2241 02/01/17 0241 02/01/17 0419 02/01/17 0515  BP: 139/83 (!) 133/92  (!) 126/91  Pulse: 81 83  81  Resp: 16 16  16   Temp: 98.2 F (36.8 C) 97.5 F (36.4 C)  97.7 F (36.5 C)  TempSrc: Oral Oral  Oral  SpO2: 98% 96%  96%  Weight:   226 lb 4 oz (102.6 kg)    Physical Exam  Constitutional: He appears well-developed and well-nourished.  Obese  HENT:  Poor dentition  Cardiovascular: Normal rate and regular rhythm.   No murmur heard. Respiratory: Effort normal and breath sounds normal. No respiratory distress. He has no wheezes.  GI: Soft. Bowel sounds are normal. He exhibits no distension.  Musculoskeletal: He exhibits no edema.  Neurological: He is alert.  Patient alert. Oriented to person and place. Not oriented to time. Becomes visibly frustrated with ongoing conversation.     Assessment/Plan:  Active Problems:   Schizophrenia (HCC)   Hypercalcemia   Altered mental status, unspecified   Generalized weakness   Syphilis   Disorientation  Patient is a 76 year old male with a past medical history of prostate cancer, schizophrenia, hypercalcemia with potential parathyroid adenoma who presents from a facility with altered mental status.  # Altered mental status # Generalized Weakness # Hypercalcemia # Syphilis Patient continues to slowly improve in terms of interaction.  He has generalized weakness. The exact etiology of the  patient's altered mental status remains unclear. MRI was motion degraded but without evidence of stroke. Neurology has signed off without additional recommendations. Patient's RPR and T Pallidum Abs are positive. Given patient's syphilis of unknown duration and presentation of altered mental status, we will purse a lumbar puncture to assess for neurosyphilis as this will alter further management. Patient remains hypercalcemic today with a calcium of 10.6. Given his low albumin level of 3 his corrected calcium is approximately 11. This may continue to be potential contributor to the patient's mental status. I will give the patient more IV fluids today and attempt to further lower this value. -- NS @ 125 mL/hr for 10 hours -- Calcitonin 4 units/kg given once on 01/31/2017 -- CMP in AM -- Urine drug screen- negative -- HIV- non-reactive -- RPR with 1:2 ratio and Positive Treponemal Pallidum Abs - will pursue LP -- Valproic acid level- within therapeutic range -- Continue PT and SLP  -- Lumbar puncture  # Schizophrenia The patient has a history of schizophrenia which he takes divalproex. -- valproic acid level- within therapeutic range -- Continue home divalproex   DVT/PE prophylaxis:Lovenox FEN/GI:Dysphagia 3 Code:Full code  Dispo: Anticipated discharge in 1-3 days pending clinical course   Ophelia Shoulder, MD 02/01/2017, 8:23 AM
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
## 2017-02-02 ENCOUNTER — Inpatient Hospital Stay (HOSPITAL_COMMUNITY): Payer: Medicare Other

## 2017-02-02 DIAGNOSIS — Z9889 Other specified postprocedural states: Secondary | ICD-10-CM

## 2017-02-02 DIAGNOSIS — D351 Benign neoplasm of parathyroid gland: Secondary | ICD-10-CM

## 2017-02-02 LAB — COMPREHENSIVE METABOLIC PANEL
ALBUMIN: 2.6 g/dL — AB (ref 3.5–5.0)
ALK PHOS: 43 U/L (ref 38–126)
ALT: 68 U/L — AB (ref 17–63)
AST: 36 U/L (ref 15–41)
Anion gap: 10 (ref 5–15)
BILIRUBIN TOTAL: 0.9 mg/dL (ref 0.3–1.2)
BUN: 11 mg/dL (ref 6–20)
CALCIUM: 11.1 mg/dL — AB (ref 8.9–10.3)
CO2: 21 mmol/L — AB (ref 22–32)
CREATININE: 0.67 mg/dL (ref 0.61–1.24)
Chloride: 114 mmol/L — ABNORMAL HIGH (ref 101–111)
GFR calc Af Amer: >60 mL/min (ref >60)
GFR calc non Af Amer: >60 mL/min (ref >60)
GLUCOSE: 79 mg/dL (ref 65–99)
Potassium: 4.5 mmol/L (ref 3.5–5.1)
SODIUM: 145 mmol/L (ref 135–145)
TOTAL PROTEIN: 5.2 g/dL — AB (ref 6.5–8.1)

## 2017-02-02 LAB — VDRL, CSF: VDRL Quant, CSF: NONREACTIVE

## 2017-02-02 MED ORDER — PENICILLIN G BENZATHINE & PROC 900000-300000 UNIT/2ML IM SUSP
2.4000 10*6.[IU] | Freq: Once | INTRAMUSCULAR | Status: AC
  Administered 2017-02-02: 2.4 10*6.[IU] via INTRAMUSCULAR
  Filled 2017-02-02: qty 4

## 2017-02-02 MED ORDER — SODIUM CHLORIDE 0.9 % IV SOLN
INTRAVENOUS | Status: AC
  Administered 2017-02-02: 1000 mL via INTRAVENOUS

## 2017-02-02 NOTE — Progress Notes (Signed)
Internal Medicine Attending:   I saw and examined the patient. I reviewed the resident's note and I agree with the resident's findings and plan as documented in the resident's note.  Patient with no new complaints and his mental status is slowly improving.  It still remains unclear what his baseline mental status is. Lumbar puncture was done yesterday which showed no evidence of an infection. 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 We will start penicillin G 2.4 million units once weekly for 3 weeks. 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 It is possible that his hypercalcemia contributing to his altered mental status but this appears unlikely. Continue with physical therapy. Patient will need placement in a SNF on discharge. SLP follow-up appreciated. Continue with dysphagia 3 diet for now.  Patient has a history of schizophrenia for which he takes divalproex. It is possible given his psychomotor slowing and altered mental status that his symptoms are secondary to his underlying psychiatric disorder. He will need close follow-up with a psychiatrist once discharged from the hospital.

## 2017-02-02 NOTE — Care Management Note (Signed)
Case Management Note  Patient Details  Name: Henry Smith MRN: [id number] Date of Birth: 10/17/1941  Subjective/Objective:                    Action/Plan: Plan is for SNF at d/c. CM following for d/c disposition and MD orders.   Expected Discharge Date:                  Expected Discharge Plan:  Skilled Nursing Facility  In-House Referral:  Clinical Social Work  Discharge planning Services     Post Acute Care Choice:    Choice offered to:     DME Arranged:    DME Agency:     HH Arranged:    [place] Agency:     Status of Service:  In process, will continue to follow  If discussed at Long Length of Stay Meetings, dates discussed:    Additional Comments:  Pollie Friar, RN 02/02/2017, 4:18 PM
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
## 2017-02-02 NOTE — Progress Notes (Signed)
   Subjective:  No acute events overnight. Patient not having any pain from his lumbar puncture site. His mental status continues to improve. He was alert and oriented to person, place and month. Still unclear what his baseline is. We'll need ongoing information from his group home to see when he is possibly ready to go home. Objective:  Vital signs in last 24 hours: Vitals:   02/01/17 2151 02/02/17 0100 02/02/17 0420 02/02/17 1002  BP: 135/86 (!) 145/75 114/61 140/80  Pulse: 75 63 60   Resp: 20 18 20 20   Temp: 98 F (36.7 C) 97.8 F (36.6 C) 97.9 F (36.6 C) 98 F (36.7 C)  TempSrc: Oral Oral Axillary Oral  SpO2: 98% 96% 97% 96%  Weight:       Physical Exam  Constitutional: He appears well-developed and well-nourished.  Obese  HENT:  Poor dentition  Cardiovascular: Normal rate and regular rhythm.   No murmur heard. Respiratory: Effort normal and breath sounds normal. No respiratory distress. He has no wheezes.  GI: Soft. Bowel sounds are normal. He exhibits no distension.  Musculoskeletal: He exhibits no edema.  Neurological: He is alert.  Patient alert. Oriented to person and place. Not oriented to time.      Assessment/Plan:  Active Problems:   Schizophrenia (HCC)   Hypercalcemia   Altered mental status, unspecified   Generalized weakness   Syphilis   Disorientation  Patient is a 76 year old male with a past medical history of prostate cancer, schizophrenia, hypercalcemia with potential parathyroid adenoma who presents from a facility with altered mental status.  # Altered mental status # Generalized Weakness # Hypercalcemia # Syphilis Patient's mental status somewhat improved today. Results of CSF were unrevealing. He does not appear to have meningitis. The exact etiology of his altered mental status remains unclear but I still think there is some component of hypercalcemia on his initial presentation. Again, we are limited in knowing what he is at its baseline  as we have not had anyone from his group home come to see how he is doing from a mental status standpoint. His CSF profile was not consistent with neurosyphilis. However, it is unclear if he has been treated for this in the past and we will start penicillin today for the treatment of syphilis. We will also work on discussing further with his group home in attempts to plan for discharge tomorrow. -- Calcitonin 4 units/kg given once on 01/31/2017 -- CMP in AM -- Urine drug screen- negative -- HIV- non-reactive -- RPR with 1:2 ratio and Positive Treponemal Pallidum Abs - will pursue LP -- Valproic acid level- within therapeutic range -- Continue PT and SLP  -- Lumbar puncture- nonrevealing, normal results of far. -- Penicillin G 2.4 million units once weekly for 3 weeks for the treatment of latent syphilis  # Schizophrenia The patient has a history of schizophrenia which he takes divalproex. We have recommended a follow-up with psychiatry once discharged from the hospital for ongoing management and evaluation. -- valproic acid level- within therapeutic range -- Continue home divalproex   DVT/PE prophylaxis:Lovenox FEN/GI:Dysphagia 3 Code:Full code  Dispo: Anticipated discharge in 1-2 days pending clinical course   Ophelia Shoulder, MD 02/02/2017, 12:54 PM

## 2017-02-02 NOTE — Clinical Social Work Note (Signed)
Clinical Social Worker continuing to follow patient and family for support and discharge planning needs.  CSW spoke with patient guardian, [name], to provide bed offers.  Patient guardian has chosen [facility].  CSW left a message for facility regarding acceptance of bed offer and await return call to confirm bed availability.  Patient will need 30 day note signed for [place] - CSW to notify MD.  CSW remains available for support and to facilitate patient discharge needs once medically stable.  Barbette Or, [place]

## 2017-02-02 NOTE — Care Management Note (Signed)
Case Management Note  Patient Details  Name: Jsutin Bieker MRN: [id number] Date of Birth: 1941/05/18  Subjective/Objective:                    Action/Plan: Plan is for SNF for rehab once medically ready. CM and CSW following.  Expected Discharge Date:                  Expected Discharge Plan:  Skilled Nursing Facility  In-House Referral:  Clinical Social Work  Discharge planning Services     Post Acute Care Choice:    Choice offered to:     DME Arranged:    DME Agency:     HH Arranged:    [place] Agency:     Status of Service:  In process, will continue to follow  If discussed at Long Length of Stay Meetings, dates discussed:    Additional Comments:  Pollie Friar, RN 02/02/2017, 3:52 PM

## 2017-02-02 NOTE — Progress Notes (Signed)
Modified Barium Swallow Progress Note  Patient Details  Name: Henry Smith MRN: [id number] Date of Birth: [birth year]  Today's Date: 02/02/2017  Modified Barium Swallow completed.  Full report located under Chart Review in the Imaging Section.  Brief recommendations include the following:  Clinical Impression  Pt has a mild oropharyngeal dysphagia due to current mentation and impaired timing. He has mildly prolonged mastication and decreased cohesion of boluses. Oral holding was only observed when pt attempted to take the barium tablet with a very large bolus of thin liquid. Despite Max cueing, pt ultimately had to orally expectorate the liquid and pill together. Pt had intermittent, shallow penetration of thin liquids due to impaired timing, which occurred more frequently with straw sips. He was able to use a cued throat clear to expel penetrates. Recommend continuing Dys 3 diet with advancement to thin liquids by cup, using an intermittent throat clear during intake.   Swallow Evaluation Recommendations       SLP Diet Recommendations: Dysphagia 3 (Mech soft) solids;Thin liquid   Liquid Administration via: Cup;No straw   Medication Administration: Whole meds with puree   Supervision: Patient able to self feed;Full supervision/cueing for compensatory strategies   Compensations: Slow rate;Small sips/bites;Minimize environmental distractions;Clear throat intermittently   Postural Changes: Remain semi-upright after after feeds/meals (Comment);Seated upright at 90 degrees   Oral Care Recommendations: Oral care BID        Germain Osgood 02/02/2017,2:45 PM   Germain Osgood, M.A. CCC-SLP [phone]

## 2017-02-03 ENCOUNTER — Inpatient Hospital Stay (HOSPITAL_COMMUNITY)
Admit: 2017-02-03 | Discharge: 2017-02-03 | Disposition: A | Discharge status: Home / Self Care | Payer: Medicare Other | Attending: Internal Medicine | Admitting: Internal Medicine

## 2017-02-03 DIAGNOSIS — A530 Latent syphilis, unspecified as early or late: Secondary | ICD-10-CM

## 2017-02-03 DIAGNOSIS — R4182 Altered mental status, unspecified: Secondary | ICD-10-CM

## 2017-02-03 LAB — BASIC METABOLIC PANEL
Anion gap: 4 — ABNORMAL LOW (ref 5–15)
BUN: 9 mg/dL (ref 6–20)
CHLORIDE: 111 mmol/L (ref 101–111)
CO2: 27 mmol/L (ref 22–32)
Calcium: 11.5 mg/dL — ABNORMAL HIGH (ref 8.9–10.3)
Creatinine, Ser: 0.72 mg/dL (ref 0.61–1.24)
GFR calc Af Amer: >60 mL/min (ref >60)
Glucose, Bld: 84 mg/dL (ref 65–99)
POTASSIUM: 3.7 mmol/L (ref 3.5–5.1)
SODIUM: 142 mmol/L (ref 135–145)

## 2017-02-03 MED ORDER — FUROSEMIDE 10 MG/ML IJ SOLN
20.0000 mg | Freq: Two times a day (BID) | INTRAMUSCULAR | Status: AC
  Administered 2017-02-03 (×2): 20 mg via INTRAVENOUS
  Filled 2017-02-03 (×2): qty 4

## 2017-02-03 MED ORDER — SODIUM CHLORIDE 0.9 % IV BOLUS (SEPSIS)
1000.0000 mL | Freq: Once | INTRAVENOUS | Status: AC
  Administered 2017-02-03: 1000 mL via INTRAVENOUS

## 2017-02-03 MED ORDER — FUROSEMIDE 10 MG/ML IJ SOLN: 20.0000 mg | Freq: Once | INTRAMUSCULAR | Status: DC

## 2017-02-03 MED ORDER — CALCITONIN (SALMON) 200 UNIT/ML IJ SOLN
8.0000 [IU]/kg | Freq: Once | INTRAMUSCULAR | Status: AC
  Administered 2017-02-03: 820 [IU] via INTRAMUSCULAR
  Filled 2017-02-03: qty 4.1

## 2017-02-03 NOTE — Care Management Important Message (Signed)
Important Message  Patient Details  Name: Henry Smith MRN: [id number] Date of Birth: 1941/10/15   Medicare Important Message Given:  Yes    Gracianna Vink Montine Circle 02/03/2017, 11:17 AM
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
## 2017-02-03 NOTE — Progress Notes (Signed)
Internal Medicine Attending:   I saw and examined the patient. I reviewed the resident's note and I agree with the resident's findings and plan as documented in the resident's note.  Patient feels well but is still confused. It is unclear what his baseline is. We will need to speak to his guardian again regarding his baseline mental status. He was noted to be more hypercalcemic today with a calcium of 11.5. We'll continue with IV fluids and add a loop diuretic today to attempt to treat his hypercalcemia. We will also give him a dose of intramuscular calcitonin. 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 Complete treatment for his latent syphilis with weekly penicillin for 3 weeks.

## 2017-02-03 NOTE — Progress Notes (Signed)
EEG completed; results pending.    

## 2017-02-03 NOTE — Clinical Social Work Note (Signed)
Clinical Social Worker continuing to follow patient and family for support and discharge planning needs.  CSW spoke with patient guardian over the phone to explain no bed availability at [facility] - patient guardian agreeable with placement at [facility].  CSW contacted facility and left a message to accept bed offer, will await return call.  Per MD, patient likely ready for discharge tomorrow.  CSW remains available for support and to facilitate patient discharge needs once medically stable.  Barbette Or, [place]

## 2017-02-03 NOTE — Care Management Important Message (Signed)
Important Message  Patient Details  Name: Henry Smith MRN: [id number] Date of Birth: March 12, 1941   Medicare Important Message Given:  No Correction   Bobbyjoe Pabst 02/03/2017, 11:18 AM

## 2017-02-03 NOTE — Progress Notes (Signed)
RN tried multiple times to give patient his nightly medications however patient kept spitting the medication out.  The medication was given in apple sauce.  The patient would swallow the apple sauce and then would spit out the pills.  The patient ate an entire cup of apple sauce.  Provider paged.  RN will continue to monitor

## 2017-02-03 NOTE — Progress Notes (Signed)
   Subjective:  No acute events overnight. Patient is not in any pain. He is oriented to person, place, age and birthdate. Overall, he still seems disoriented. As previously stated his baseline is unclear and we have not been getting too much input from his group facility. Objective:  Vital signs in last 24 hours: Vitals:   02/03/17 0203 02/03/17 0547 02/03/17 0700 02/03/17 0926  BP: 140/83 (!) 158/86 (!) 153/69 (!) 162/86  Pulse: (!) 55 61 63 66  Resp: 16 16 18 20   Temp: 97.5 F (36.4 C) 97.7 F (36.5 C) 98.3 F (36.8 C) 98.7 F (37.1 C)  TempSrc: Oral Oral Oral Oral  SpO2: 96% 99% 97% 99%  Weight:       Physical Exam  Constitutional: He appears well-developed and well-nourished.  Obese  HENT:  Poor dentition  Cardiovascular: Normal rate and regular rhythm.   No murmur heard. Respiratory: Effort normal and breath sounds normal. No respiratory distress. He has no wheezes.  GI: Soft. Bowel sounds are normal. He exhibits no distension.  Musculoskeletal: He exhibits no edema.  Neurological: He is alert.  Patient alert. Oriented to person, place, age and birthdate. Not oriented to time.     Assessment/Plan:  Active Problems:   Schizophrenia (HCC)   Hypercalcemia   Altered mental status, unspecified   Generalized weakness   Syphilis   Disorientation  Patient is a 76 year old male with a past medical history of prostate cancer, schizophrenia, hypercalcemia with potential parathyroid adenoma who presents from a facility with altered mental status.  # Altered mental status # Generalized Weakness # Hypercalcemia # Syphilis Patient's mental status continues to fluctuate.. Results of CSF were unrevealing. There continues to be an unclear etiology for the patient's presentation and altered mental status. His generalized weakness has improved. We started treatment for his syphilis yesterday with IM penicillin. He is still hypercalcemic with a calcium of 11.5 today. We will work  on further lowering his calcium with furosemide, IV fluids and intramuscular calcitonin. -- Calcitonin 4 units/kg given once on 01/31/2017 -- Calcitonin 8 units per kilogram given today -- 1 L normal saline bolus -- Furosemide 20 mg IV 2 -- CMP in AM -- Urine drug screen- negative -- HIV- non-reactive -- RPR with 1:2 ratio and Positive Treponemal Pallidum Abs - will pursue LP -- Valproic acid level- within therapeutic range -- Continue PT and SLP  -- Lumbar puncture- nonrevealing, normal results of far. -- Penicillin G 2.4 million units once weekly for 3 weeks for the treatment of latent syphilis   # Hypertension Patient noted to be hypertensive. Was not on antihypertensive medication. He also remains hypercalcemic. We will give the patient a loop diuretic and treated with furosemide 20 mg IV 1 for the treatment of both his hypertension and hypercalcemia. -- Furosemide 20 mg 2  # Schizophrenia The patient has a history of schizophrenia which he takes divalproex. We have recommended a follow-up with psychiatry once discharged from the hospital for ongoing management and evaluation. -- valproic acid level- within therapeutic range -- Continue home divalproex   DVT/PE prophylaxis:Lovenox FEN/GI:Dysphagia 3 Code:Full code  Dispo: Anticipated discharge in 1-2 days pending clinical course   Ophelia Shoulder, MD 02/03/2017, 11:19 AM

## 2017-02-03 NOTE — Progress Notes (Signed)
  Speech Language Pathology Treatment: Dysphagia  Patient Details Name: Lynn Wagaman MRN: [id number] DOB: 07/16/1941 Today's Date: 02/03/2017 Time: [id number] SLP Time Calculation (min) (ACUTE ONLY): 11 min  Assessment / Plan / Recommendation Clinical Impression  Pt seen for f/u after MBS on previous date. Straw removed from cup at bedside. Mod cues provided for initiation of tasks, with additional Mod cueing needed for smaller sips and need for intermittent throat clear. This is delayed and presents more as a cough, making it somewhat questionable if he is coughing reflexively or to commands provided. Either way, on MBS on previous date he was able to clear trace penetrates with cued throat clearing. Recommend to continue current diet and precautions with continued SLP monitoring.   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 CT Head negative for acute findings, MRI pending.      SLP Plan  Continue with current plan of care       Recommendations  Diet recommendations: Dysphagia 3 (mechanical soft);Thin liquid Liquids provided via: Cup;No straw Medication Administration: Whole meds with puree Supervision: Patient able to self feed;Full supervision/cueing for compensatory strategies Compensations: Slow rate;Small sips/bites;Minimize environmental distractions;Clear throat intermittently Postural Changes and/or Swallow Maneuvers: Seated upright 90 degrees;Upright 30-60 min after meal                Oral Care Recommendations: Oral care BID Follow up Recommendations: Skilled Nursing facility SLP Visit Diagnosis: Dysphagia, oropharyngeal phase (R13.12) Plan: Continue with current plan of care       GO                Germain Osgood 02/03/2017, 10:20  AM  Germain Osgood, M.A. CCC-SLP [phone]

## 2017-02-03 NOTE — Procedures (Signed)
ELECTROENCEPHALOGRAM REPORT  Date of Study: 02/03/2017  Patient's Name: Henry Smith MRN: [id number] Date of Birth: August 08, 1941  Referring Provider: Dr. Ophelia Shoulder  Clinical History: This is a 76 year old man with altered mental status.  Medications: divalproex (DEPAKOTE ER) 24 hr tablet 1,250 mg  aspirin EC tablet 325 mg  enoxaparin (LOVENOX) injection 40 mg  furosemide (LASIX) injection 20 mg   Technical Summary: A multichannel digital EEG recording measured by the international 10-20 system with electrodes applied with paste and impedances below 5000 ohms performed as portable with EKG monitoring in an awake patient.  Hyperventilation and photic stimulation were not performed.  The digital EEG was referentially recorded, reformatted, and digitally filtered in a variety of bipolar and referential montages for optimal display.   Description: The patient is awake during the recording. The EEG is limited due to chewing artifact throughout majority of the study. There is about 8 minutes of EEG without artifact in this 20 minute study. During maximal wakefulness, there is a symmetric, medium voltage 7 Hz posterior dominant rhythm that attenuates with eye opening. This is admixed with a small amount of diffuse 4-6 Hz theta slowing of the waking background. Sleep architecture is not seen. Hyperventilation and photic stimulation were not performed.  There were no epileptiform discharges or electrographic seizures seen.    EKG lead was unremarkable.  Impression: This limited awake EEG is abnormal due to mild diffuse slowing of the waking background.  Clinical Correlation of the above findings indicates diffuse cerebral dysfunction that is non-specific in etiology and can be seen with hypoxic/ischemic injury, toxic/metabolic encephalopathies, neurodegenerative disorders, or medication effect.  The absence of epileptiform discharges does not rule out a clinical diagnosis of epilepsy. The  study is limited due to chewing artifact throughout majority of the study. Clinical correlation is advised.   Ellouise Newer, M.D.

## 2017-02-04 LAB — CBC
HEMATOCRIT: 45.8 % (ref 39.0–52.0)
HEMOGLOBIN: 14.9 g/dL (ref 13.0–17.0)
MCH: 27.7 pg (ref 26.0–34.0)
MCHC: 32.5 g/dL (ref 30.0–36.0)
MCV: 85.3 fL (ref 78.0–100.0)
Platelets: 122 10*3/uL — ABNORMAL LOW (ref 150–400)
RBC: 5.37 MIL/uL (ref 4.22–5.81)
RDW: 14.4 % (ref 11.5–15.5)
WBC: 4.1 10*3/uL (ref 4.0–10.5)

## 2017-02-04 LAB — CSF CULTURE W GRAM STAIN

## 2017-02-04 LAB — COMPREHENSIVE METABOLIC PANEL
ALBUMIN: 3 g/dL — AB (ref 3.5–5.0)
ALT: 60 U/L (ref 17–63)
ANION GAP: 9 (ref 5–15)
AST: 35 U/L (ref 15–41)
Alkaline Phosphatase: 53 U/L (ref 38–126)
BILIRUBIN TOTAL: 0.5 mg/dL (ref 0.3–1.2)
BUN: 11 mg/dL (ref 6–20)
CO2: 27 mmol/L (ref 22–32)
Calcium: 11.1 mg/dL — ABNORMAL HIGH (ref 8.9–10.3)
Chloride: 106 mmol/L (ref 101–111)
Creatinine, Ser: 0.66 mg/dL (ref 0.61–1.24)
GFR calc Af Amer: >60 mL/min (ref >60)
GFR calc non Af Amer: >60 mL/min (ref >60)
GLUCOSE: 100 mg/dL — AB (ref 65–99)
Potassium: 3.5 mmol/L (ref 3.5–5.1)
Sodium: 142 mmol/L (ref 135–145)
TOTAL PROTEIN: 6.2 g/dL — AB (ref 6.5–8.1)

## 2017-02-04 LAB — GLUCOSE, CAPILLARY: Glucose-Capillary: 96 mg/dL (ref 65–99)

## 2017-02-04 LAB — CSF CULTURE
CULTURE: NO GROWTH
GRAM STAIN: NONE SEEN

## 2017-02-04 MED ORDER — PENICILLIN G BENZATHINE & PROC 900000-300000 UNIT/2ML IM SUSP: 2.4000 10*6.[IU] | INTRAMUSCULAR | 0 refills | Status: AC

## 2017-02-04 NOTE — Progress Notes (Signed)
RN attempted to call [facility] several of times to give report, no one ever responded.

## 2017-02-04 NOTE — Discharge Summary (Signed)
Name: [facility] MRN: [id number] DOB: May 04, 1941 76 y.o. PCP: Lorelee Market, MD  Date of Admission: 01/28/2017  1:05 PM Date of Discharge: 02/04/2017 Attending Physician: Aldine Contes, MD  Discharge Diagnosis: 1. Altered mental status 2. Hypercalcemia 3. Latent syphilis 4. Schizophrenia Active Problems:   Schizophrenia ([place])   Hypercalcemia   Altered mental status, unspecified   Generalized weakness   Syphilis   Disorientation   Discharge Medications: Allergies as of 02/04/2017   No Known Allergies     Medication List    STOP taking these medications   levofloxacin 500 MG tablet Commonly known as:  LEVAQUIN     TAKE these medications   divalproex 250 MG 24 hr tablet Commonly known as:  DEPAKOTE ER Take 250 mg by mouth at bedtime.   divalproex 500 MG 24 hr tablet Commonly known as:  DEPAKOTE ER Take 1,000 mg by mouth at bedtime.   gabapentin 300 MG capsule Commonly known as:  NEURONTIN Take 300 mg by mouth 3 (three) times daily.   Melatonin 5 MG Tabs Take 5 mg by mouth at bedtime.   OLANZapine 10 MG tablet Commonly known as:  ZYPREXA Take 10 mg by mouth 2 (two) times daily.   penicillin G benzathine-penicillin G procaine 900000-300000 UNIT/2ML Susp Commonly known as:  BICILLIN-CR Inject 4 mLs (2.4 Million Units total) into the muscle once a week. Start taking on:  02/09/2017   Vitamin D3 2000 units capsule Take 4,000 Units by mouth daily.            Durable Medical Equipment        Start     Ordered   01/29/17 1504  For home use only DME 4 wheeled rolling walker with seat  Once    Question:  Patient needs a walker to treat with the following condition  Answer:  Balance problem   01/29/17 1504      Disposition and follow-up:   [facility] was discharged from [facility] in Stable condition.  At the hospital follow up visit please address:  1.  Please ensure the patient gets his next 2 doses of  penicillin for the treatment of syphilis. Please have the patient follow-up with psychiatry.  2.  Labs / imaging needed at time of follow-up: None  3.  Pending labs/ test needing follow-up: None  Follow-up Appointments: 1. Discharge to skilled [place] by problem list: Active Problems:   Schizophrenia ([place])   Hypercalcemia   Altered mental status, unspecified   Generalized weakness   Syphilis   Disorientation   1. Altered mental status Patient presented to the [facility] emergency department on 01/28/2017 from his group home with sudden onset left arm and leg weakness and difficulty speaking. In the emergency department on presentation the patient was afebrile and hemodynamically stable. A code stroke was called and a CT head without contrast showed no acute intracranial abnormality. This was followed up with a CT angiography had a neck which showed no evidence of emergent large vessel occlusion, no significant atherosclerotic disease in the neck, no intracranial arterial occlusions identified. Laboratory evaluation at presentation showed a leukopenia with a white blood cell count of 3.4 and a hypercalcemia with a calcium of 11.9. He was then admitted to the internal medicine service for ongoing management of altered mental status. After admission the patient had an MRI brain which was motion degraded but showed no evidence of acute infarct or other intracranial pathology. The patient also  had a lumbar puncture with an overall unremarkable CSF profile with no signs of infection. The exact etiology of the patient's altered mental status was never identified but he improved over the course of his hospitalization. At presentation he was averbal and would not follow commands.On the day of discharge he was interactive and following all commands. He was formally oriented to self, place, knew his birthdate and his age but could not recall the specific date. One potential  contributor to his altered mental status may be his hypercalcemia. Several attempts were made to lower this including aggressive IV hydration and calcitonin with only minimal improvement in his calcium level. He has scheduled outpatient follow-up with surgery for removal of parathyroid adenoma which should be curative. Physical therapy evaluated the patient and recommended skilled nursing facility placement. He'll be discharged to skilled nursing facility at this time.  2. 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 His hypercalcemia may be one contributor to his presentation of acutely altered mental status. Several attempts were made to lower his calcium level while inpatient including aggressive hydration and calcitonin. On the day of discharge his calcium was 11.1. He has scheduled follow-up with surgery in the outpatient setting for removal of parathyroid adenoma.  3. Latent syphilis As part of the patient's altered mental status workup an RPR was ordered. RPR was positive with a titer of 1-2. Treponemal antibody was also positive. It is unclear if he was ever treated for this in the past. We will treat this as latent syphilis given the VDRL from CSF was negative and not concerning for neurosyphilis. He was treated with one dose of IM penicillin at 2.4 million units. He will need two additional doses given once weekly. Please ensure he gets these doses and finishes his treatment for latent syphilis.  4. Schizophrenia Patient has a history of schizophrenia. He'll be continued on his normal medications for this. I would suggest he follow-up with psychiatry in the next 1-2 weeks for further evaluation and management as some of his psychomotor slowing may be secondary to his underlying mental illness. Discharge Vitals:   BP 128/74 (BP Location: Right Arm)   Pulse 80   Temp 98.2 F (36.8 C) (Oral)   Resp 20   Wt  233 lb 5 oz (105.8 kg)   SpO2 100%   BMI 33.48 kg/m   Pertinent Labs, Studies, and Procedures:  1. CT head without contrast-stable and normal noncontrast CT of the brain.  2. CT angiography head and neck-negative for emergent large vessel occlusion, no significant atherosclerosis in the neck, no intracranial arterial occlusion identified, stable negative CT appearance of the brain. For more details please see official radiology report. 3. MRI brain without contrast-markedly motion degraded examination without evidence of acute intracranial abnormality 4. Lumbar puncture-relatively normal CSF profile. No signs of infection. VDRL negative.  Discharge Instructions    Diet - low sodium heart healthy    Complete by:  As directed    Increase activity slowly    Complete by:  As directed       Signed: Ophelia Shoulder, MD 02/04/2017, 11:46 AM   Pager: [phone]

## 2017-02-04 NOTE — Progress Notes (Signed)
RN attempted to call report to nurse at [facility], and no one answered.

## 2017-02-04 NOTE — Clinical Social Work Note (Signed)
Clinical Social Worker facilitated patient discharge including contacting patient guardian and facility to confirm patient discharge plans.  Clinical information faxed to facility and guardian agreeable with plan.  CSW arranged ambulance transport via [facility] to [facility].  RN to call report prior to discharge.  Clinical Social Worker will sign off for now as social work intervention is no longer needed. Please consult [place] again if new need arises.  Barbette Or, [place]

## 2017-02-04 NOTE — Clinical Social Work Placement (Signed)
   CLINICAL SOCIAL WORK PLACEMENT  NOTE  Date:  02/04/2017  Patient Details  Name: [facility] MRN: [id number] Date of Birth: 1941-06-27  Clinical Social Work is seeking post-discharge placement for this patient at the [place] level of care (*CSW will initial, date and re-position this form in  chart as items are completed):  Yes   Patient/family provided with [facility] Work Department's list of facilities offering this level of care within the geographic area requested by the patient (or if unable, by the patient's family).  Yes   Patient/family informed of their freedom to choose among providers that offer the needed level of care, that participate in Medicare, Medicaid or managed care program needed by the patient, have an available bed and are willing to accept the patient.  Yes   Patient/family informed of [facility]'s ownership interest in [facility] and [facility], as well as of the fact that they are under no obligation to receive care at these facilities.  PASRR submitted to EDS on 02/01/17     PASRR number received on 02/03/17     Existing PASRR number confirmed on       FL2 transmitted to all facilities in geographic area requested by pt/family on 02/01/17     FL2 transmitted to all facilities within larger geographic area on       Patient informed that his/her managed care company has contracts with or will negotiate with certain facilities, including the following:        Yes   Patient/family informed of bed offers received.  Patient chooses bed at [facility]     Physician recommends and patient chooses bed at      Patient to be transferred to [facility] on 02/04/17.  Patient to be transferred to facility by Ambulance     Patient family notified on 02/04/17 of transfer.  Name of family member notified:  [name] - patient guardian by phone     PHYSICIAN       Additional Comment:     Barbette Or, [place]

## 2017-02-04 NOTE — Care Management Important Message (Signed)
Important Message  Patient Details  Name: [facility] MRN: [id number] Date of Birth: Oct 10, 1941   Medicare Important Message Given:  Yes An unsigned copy left Patient is unable to sign due to illness    [name] 02/04/2017, 11:26 AM

## 2017-02-04 NOTE — Care Management Note (Signed)
Case Management Note  Patient Details  Name: Chol Graffius MRN: [id number] Date of Birth: May 20, 1941  Subjective/Objective:                    Action/Plan: Pt discharging to SNF today. No further needs per CM.    Expected Discharge Date:  02/04/17               Expected Discharge Plan:  Skilled Nursing Facility  In-House Referral:  Clinical Social Work  Discharge planning Services     Post Acute Care Choice:    Choice offered to:     DME Arranged:    DME Agency:     HH Arranged:    [place] Agency:     Status of Service:  Completed, signed off  If discussed at [employer], dates discussed:    Additional Comments:  Pollie Friar, RN 02/04/2017, 12:32 PM

## 2017-02-04 NOTE — Progress Notes (Signed)
   Subjective:  No acute events overnight. Patient with no pain complaints today. He was more conversant on interview. States he is ready to leave the hospital. Overall says he is feeling well. He is alert and oriented to person, place, knows his age and birthdate. Not oriented formally to month or year. Unclear baseline. Objective:  Vital signs in last 24 hours: Vitals:   02/03/17 2210 02/04/17 0116 02/04/17 0500 02/04/17 0524  BP: (!) 159/99 (!) 157/96  121/85  Pulse: 86 86  81  Resp: 20 20  20   Temp: 98.2 F (36.8 C) 98 F (36.7 C)  98.2 F (36.8 C)  TempSrc: Oral Oral  Oral  SpO2: 97% 98%  99%  Weight:   233 lb 5 oz (105.8 kg)    Physical Exam  Constitutional: He appears well-developed and well-nourished.  Obese  HENT:  Poor dentition  Cardiovascular: Normal rate and regular rhythm.   No murmur heard. Respiratory: Effort normal and breath sounds normal. No respiratory distress. He has no wheezes.  GI: Soft. Bowel sounds are normal. He exhibits no distension.  Musculoskeletal: He exhibits no edema.  Neurological: He is alert.  Patient alert. Oriented to person, place, age and birthdate. Not oriented to time.     Assessment/Plan:  Active Problems:   Schizophrenia ([facility])   Hypercalcemia   Altered mental status, unspecified   Generalized weakness   Syphilis   Disorientation  Patient is a 76 year old male with a past medical history of prostate cancer, schizophrenia, hypercalcemia with potential parathyroid adenoma who presents from a facility with altered mental status.  # Altered mental status # Generalized Weakness # Hypercalcemia # Syphilis Patient's mental status continues to fluctuate. Results of CSF were unrevealing. There continues to be an unclear etiology for the patient's presentation and altered mental status. His generalized weakness has improved. He does seem more lucid today than prior. It remains unclear what his baseline is. Calcium improved today to  11.1. The patient states that he is ready to leave the hospital and overall feels well. We'll plan for discharge to skilled nursing facility today. He will need 2 additional treatments of IM penicillin for latent syphilis. -- Urine drug screen- negative -- HIV- non-reactive -- RPR with 1:2 ratio and Positive Treponemal Pallidum Abs - will pursue LP -- Valproic acid level- within therapeutic range -- Continue PT and SLP  -- Lumbar puncture- nonrevealing, normal results of far. -- Penicillin G 2.4 million units once weekly for 3 weeks for the treatment of latent syphilis   # Hypertension Currently normotensive.  # Schizophrenia The patient has a history of schizophrenia which he takes divalproex. We have recommended a follow-up with psychiatry once discharged from the hospital for ongoing management and evaluation. -- valproic acid level- within therapeutic range -- Continue home divalproex -- Follow-up with outpatient psychiatry   DVT/PE prophylaxis:Lovenox FEN/GI:Dysphagia 3 Code:Full code  Dispo: Anticipated discharge today.   Ophelia Shoulder, MD 02/04/2017, 11:19 AM

## 2017-02-17 ENCOUNTER — Ambulatory Visit: Payer: Self-pay | Admitting: Urology

## 2017-02-18 ENCOUNTER — Inpatient Hospital Stay: Payer: Medicare Other | Admitting: Hematology and Oncology

## 2017-02-18 ENCOUNTER — Inpatient Hospital Stay: Payer: Medicare Other
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
## 2017-02-26 ENCOUNTER — Encounter: Payer: Self-pay | Admitting: Emergency Medicine

## 2017-02-26 ENCOUNTER — Emergency Department: Payer: Medicare Other

## 2017-02-26 ENCOUNTER — Inpatient Hospital Stay
Admission: EM | Admit: 2017-02-26 | Discharge: 2017-02-27 | DRG: 869 | Disposition: A | Discharge status: Home / Self Care | Payer: Medicare Other | Attending: Internal Medicine | Admitting: Internal Medicine

## 2017-02-26 DIAGNOSIS — R4182 Altered mental status, unspecified: Secondary | ICD-10-CM | POA: Diagnosis present

## 2017-02-26 DIAGNOSIS — R21 Rash and other nonspecific skin eruption: Secondary | ICD-10-CM | POA: Diagnosis present

## 2017-02-26 DIAGNOSIS — Z8546 Personal history of malignant neoplasm of prostate: Secondary | ICD-10-CM

## 2017-02-26 DIAGNOSIS — T360X5A Adverse effect of penicillins, initial encounter: Secondary | ICD-10-CM | POA: Diagnosis present

## 2017-02-26 DIAGNOSIS — Z818 Family history of other mental and behavioral disorders: Secondary | ICD-10-CM

## 2017-02-26 DIAGNOSIS — B372 Candidiasis of skin and nail: Secondary | ICD-10-CM | POA: Diagnosis present

## 2017-02-26 DIAGNOSIS — F0390 Unspecified dementia without behavioral disturbance: Secondary | ICD-10-CM | POA: Diagnosis present

## 2017-02-26 DIAGNOSIS — F17210 Nicotine dependence, cigarettes, uncomplicated: Secondary | ICD-10-CM | POA: Diagnosis present

## 2017-02-26 DIAGNOSIS — A528 Late syphilis, latent: Secondary | ICD-10-CM | POA: Diagnosis not present

## 2017-02-26 DIAGNOSIS — A539 Syphilis, unspecified: Secondary | ICD-10-CM | POA: Diagnosis present

## 2017-02-26 DIAGNOSIS — F209 Schizophrenia, unspecified: Secondary | ICD-10-CM | POA: Diagnosis present

## 2017-02-26 DIAGNOSIS — T7840XA Allergy, unspecified, initial encounter: Secondary | ICD-10-CM

## 2017-02-26 HISTORY — DX: Syphilis, unspecified: A53.9

## 2017-02-26 HISTORY — DX: Schizophrenia, unspecified: F20.9

## 2017-02-26 LAB — BASIC METABOLIC PANEL
ANION GAP: 5 (ref 5–15)
BUN: 14 mg/dL (ref 6–20)
CALCIUM: 11.1 mg/dL — AB (ref 8.9–10.3)
CO2: 28 mmol/L (ref 22–32)
Chloride: 111 mmol/L (ref 101–111)
Creatinine, Ser: 1.16 mg/dL (ref 0.61–1.24)
GLUCOSE: 109 mg/dL — AB (ref 65–99)
POTASSIUM: 4.1 mmol/L (ref 3.5–5.1)
SODIUM: 144 mmol/L (ref 135–145)

## 2017-02-26 LAB — CBC
HCT: 48 % (ref 40.0–52.0)
Hemoglobin: 15.5 g/dL (ref 13.0–18.0)
MCH: 28 pg (ref 26.0–34.0)
MCHC: 32.3 g/dL (ref 32.0–36.0)
MCV: 86.9 fL (ref 80.0–100.0)
PLATELETS: 115 10*3/uL — AB (ref 150–440)
RBC: 5.53 MIL/uL (ref 4.40–5.90)
RDW: 14.9 % — AB (ref 11.5–14.5)
WBC: 8.9 10*3/uL (ref 3.8–10.6)

## 2017-02-26 LAB — ETHANOL

## 2017-02-26 LAB — MRSA PCR SCREENING: MRSA BY PCR: POSITIVE — AB

## 2017-02-26 MED ORDER — OLANZAPINE 10 MG PO TABS
10.0000 mg | ORAL_TABLET | Freq: Two times a day (BID) | ORAL | Status: DC
  Administered 2017-02-26 – 2017-02-27 (×2): 10 mg via ORAL
  Filled 2017-02-26 (×2): qty 1

## 2017-02-26 MED ORDER — SODIUM CHLORIDE 0.9 % IV BOLUS (SEPSIS)
1000.0000 mL | Freq: Once | INTRAVENOUS | Status: AC
  Administered 2017-02-26: 1000 mL via INTRAVENOUS

## 2017-02-26 MED ORDER — CLOTRIMAZOLE 1 % EX CREA
TOPICAL_CREAM | Freq: Two times a day (BID) | CUTANEOUS | Status: DC
  Administered 2017-02-26: 23:00:00 via TOPICAL
  Administered 2017-02-27: 1 via TOPICAL
  Filled 2017-02-26 (×2): qty 15

## 2017-02-26 MED ORDER — DIPHENHYDRAMINE HCL 25 MG PO CAPS
25.0000 mg | ORAL_CAPSULE | Freq: Four times a day (QID) | ORAL | Status: DC | PRN
  Administered 2017-02-26 – 2017-02-27 (×2): 25 mg via ORAL
  Filled 2017-02-26 (×2): qty 1

## 2017-02-26 MED ORDER — ENOXAPARIN SODIUM 40 MG/0.4ML ~~LOC~~ SOLN
40.0000 mg | SUBCUTANEOUS | Status: DC
  Administered 2017-02-26: 40 mg via SUBCUTANEOUS
  Filled 2017-02-26: qty 0.4

## 2017-02-26 MED ORDER — ACETAMINOPHEN 325 MG PO TABS: 650.0000 mg | ORAL_TABLET | Freq: Four times a day (QID) | ORAL | Status: DC | PRN

## 2017-02-26 MED ORDER — DIVALPROEX SODIUM ER 250 MG PO TB24
250.0000 mg | ORAL_TABLET | Freq: Every day | ORAL | Status: DC
  Administered 2017-02-26: 250 mg via ORAL
  Filled 2017-02-26: qty 1

## 2017-02-26 MED ORDER — DIVALPROEX SODIUM ER 500 MG PO TB24
1000.0000 mg | ORAL_TABLET | Freq: Every day | ORAL | Status: DC
  Administered 2017-02-26: 21:00:00 1000 mg via ORAL
  Filled 2017-02-26: qty 2

## 2017-02-26 MED ORDER — ONDANSETRON HCL 4 MG/2ML IJ SOLN: 4.0000 mg | Freq: Four times a day (QID) | INTRAMUSCULAR | Status: DC | PRN

## 2017-02-26 MED ORDER — ZOLEDRONIC ACID 4 MG/5ML IV CONC
4.0000 mg | Freq: Once | INTRAVENOUS | Status: AC
  Administered 2017-02-26: 4 mg via INTRAVENOUS
  Filled 2017-02-26 (×2): qty 5

## 2017-02-26 MED ORDER — METHYLPREDNISOLONE SODIUM SUCC 125 MG IJ SOLR
125.0000 mg | Freq: Once | INTRAMUSCULAR | Status: AC
  Administered 2017-02-26: 125 mg via INTRAVENOUS
  Filled 2017-02-26: qty 2

## 2017-02-26 MED ORDER — ACETAMINOPHEN 650 MG RE SUPP: 650.0000 mg | Freq: Four times a day (QID) | RECTAL | Status: DC | PRN

## 2017-02-26 MED ORDER — FAMOTIDINE IN NACL 20-0.9 MG/50ML-% IV SOLN
20.0000 mg | Freq: Once | INTRAVENOUS | Status: AC
  Administered 2017-02-26: 20 mg via INTRAVENOUS
  Filled 2017-02-26: qty 50

## 2017-02-26 MED ORDER — SODIUM CHLORIDE 0.9 % IV SOLN
INTRAVENOUS | Status: DC
Start: 2017-02-26 — End: 2017-02-27
  Administered 2017-02-26: 18:00:00 via INTRAVENOUS

## 2017-02-26 MED ORDER — ONDANSETRON HCL 4 MG PO TABS: 4.0000 mg | ORAL_TABLET | Freq: Four times a day (QID) | ORAL | Status: DC | PRN

## 2017-02-26 MED ORDER — MELATONIN 5 MG PO TABS
5.0000 mg | ORAL_TABLET | Freq: Every day | ORAL | Status: DC
  Administered 2017-02-26: 5 mg via ORAL
  Filled 2017-02-26 (×2): qty 1

## 2017-02-26 NOTE — ED Triage Notes (Signed)
Pt arrived to ED by EMS from [facility] after an unwitnessed fall. Pt was found on the floor and staff states his current baseline is not his norm. Pt also has  c/o of rash r/t possible allergic reaction to recent penicillin shot received for syphilis. Per EMS pt also recently admitted for decreased calcium. Pt denies SOB or pain.
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
## 2017-02-26 NOTE — ED Notes (Signed)
Pt disconnected himself from all monitoring equipment and climbed out the end of the bed and went to bathroom - cleaned pt and changed diaper - changed linen - placed pt back in bed and reconnected all monitoring equipment

## 2017-02-26 NOTE — ED Notes (Signed)
Patient transported to CT 

## 2017-02-26 NOTE — ED Notes (Signed)
Pharmacy called to send zometa 4mg 
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
## 2017-02-27 DIAGNOSIS — R4182 Altered mental status, unspecified: Secondary | ICD-10-CM

## 2017-02-27 LAB — AMMONIA: Ammonia: 34 umol/L (ref 9–35)

## 2017-02-27 LAB — URINALYSIS, COMPLETE (UACMP) WITH MICROSCOPIC
BACTERIA UA: NONE SEEN
BILIRUBIN URINE: NEGATIVE
Glucose, UA: NEGATIVE mg/dL
Hgb urine dipstick: NEGATIVE
KETONES UR: 5 mg/dL — AB
Leukocytes, UA: NEGATIVE
Nitrite: NEGATIVE
PROTEIN: NEGATIVE mg/dL
Specific Gravity, Urine: 1.031 — ABNORMAL HIGH (ref 1.005–1.030)
pH: 5 (ref 5.0–8.0)

## 2017-02-27 LAB — BASIC METABOLIC PANEL
Anion gap: 3 — ABNORMAL LOW (ref 5–15)
BUN: 17 mg/dL (ref 6–20)
CHLORIDE: 111 mmol/L (ref 101–111)
CO2: 27 mmol/L (ref 22–32)
CREATININE: 0.49 mg/dL — AB (ref 0.61–1.24)
Calcium: 10.9 mg/dL — ABNORMAL HIGH (ref 8.9–10.3)
GFR calc Af Amer: >60 mL/min (ref >60)
GFR calc non Af Amer: >60 mL/min (ref >60)
Glucose, Bld: 125 mg/dL — ABNORMAL HIGH (ref 65–99)
POTASSIUM: 3.6 mmol/L (ref 3.5–5.1)
SODIUM: 141 mmol/L (ref 135–145)

## 2017-02-27 LAB — URINE DRUG SCREEN, QUALITATIVE (ARMC ONLY)
Amphetamines, Ur Screen: NOT DETECTED
Barbiturates, Ur Screen: NOT DETECTED
Benzodiazepine, Ur Scrn: NOT DETECTED
CANNABINOID 50 NG, UR ~~LOC~~: NOT DETECTED
COCAINE METABOLITE, UR ~~LOC~~: NOT DETECTED
MDMA (ECSTASY) UR SCREEN: NOT DETECTED
Methadone Scn, Ur: NOT DETECTED
OPIATE, UR SCREEN: NOT DETECTED
PHENCYCLIDINE (PCP) UR S: NOT DETECTED
Tricyclic, Ur Screen: NOT DETECTED

## 2017-02-27 LAB — PTH, INTACT AND CALCIUM
CALCIUM TOTAL (PTH): 12 mg/dL — AB (ref 8.6–10.2)
PTH: 41 pg/mL (ref 15–65)

## 2017-02-27 LAB — CALCIUM, IONIZED: Calcium, Ionized, Serum: 6.6 mg/dL — ABNORMAL HIGH (ref 4.5–5.6)

## 2017-02-27 MED ORDER — MUPIROCIN 2 % EX OINT
1.0000 "application " | TOPICAL_OINTMENT | Freq: Two times a day (BID) | CUTANEOUS | Status: DC
  Administered 2017-02-27: 1 via NASAL
  Filled 2017-02-27: qty 22

## 2017-02-27 MED ORDER — DOXYCYCLINE HYCLATE 100 MG PO TABS
100.0000 mg | ORAL_TABLET | Freq: Two times a day (BID) | ORAL | Status: DC
  Administered 2017-02-27: 100 mg via ORAL
  Filled 2017-02-27: qty 1

## 2017-02-27 MED ORDER — DOXYCYCLINE HYCLATE 100 MG PO TABS: 100.0000 mg | ORAL_TABLET | Freq: Two times a day (BID) | ORAL | 0 refills | Status: DC

## 2017-02-27 MED ORDER — DIPHENHYDRAMINE HCL 25 MG PO CAPS: 25.0000 mg | ORAL_CAPSULE | Freq: Three times a day (TID) | ORAL | 0 refills | Status: DC | PRN

## 2017-02-27 MED ORDER — CHLORHEXIDINE GLUCONATE CLOTH 2 % EX PADS: 6.0000 | MEDICATED_PAD | Freq: Every day | CUTANEOUS | Status: DC

## 2017-02-27 MED ORDER — SODIUM CHLORIDE 0.9 % IV SOLN: Freq: Once | INTRAVENOUS | Status: DC

## 2017-02-27 MED ORDER — SODIUM CHLORIDE 0.9% FLUSH
3.0000 mL | INTRAVENOUS | Status: DC | PRN
  Administered 2017-02-27: 3 mL via INTRAVENOUS
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
## 2017-02-27 NOTE — Progress Notes (Signed)
Oral and written AVS discharge instruction went over with pt with written copy sent in discharge packet. Cab is here called by Group home. Discharged to group home with pt transported to cab in transport chair.

## 2017-02-27 NOTE — Progress Notes (Signed)
Patient refused labs to be drawn this morning, educated.

## 2017-02-27 NOTE — Progress Notes (Signed)
RN responded to activated bed alarm with pt getting OOB. IV site intact with kling wrap off with new wrap applied with pt education done.

## 2017-02-27 NOTE — NC FL2 (Signed)
  [place] LEVEL OF CARE SCREENING TOOL     IDENTIFICATION  Patient Name: Henry Smith Birthdate: 07/04/1941 Sex: male Admission Date (Current Location): 02/26/2017  [facility] and [place] Number:  [name]  ([id number]) Facility and Address:  [facility], [address], [place], [place] [address]      Provider Number: [id number]  Attending Physician Name and Address:  Fritzi Mandes, MD  Relative Name and Phone Number:       Current Level of Care: Hospital Recommended Level of Care: [facility] Prior Approval Number:    Date Approved/Denied:   PASRR Number:  ([id number] E expires on 03/05/2017 )  Discharge Plan: Domiciliary (Rest home)    Current Diagnoses: Patient Active Problem List   Diagnosis Date Noted  . Altered mental status 02/26/2017  . Syphilis 01/31/2017  . Disorientation   . Generalized weakness 01/30/2017  . Altered mental status, unspecified 01/28/2017  . Hypercalcemia 11/01/2016  . Prostate cancer ([place]) 10/30/2016  . Leukopenia 10/30/2016  . Thrombocytopenia ([place]) 10/30/2016  . Schizophrenia ([place]) 10/26/2016    Orientation RESPIRATION BLADDER Height & Weight     Self, Place  Normal Continent Weight: 228 lb 11.2 oz (103.7 kg) Height:  5\' 10"  (177.8 cm)  BEHAVIORAL SYMPTOMS/MOOD NEUROLOGICAL BOWEL NUTRITION STATUS   (none)  (none) Continent Diet (Diet: General )  AMBULATORY STATUS COMMUNICATION OF NEEDS Skin   Supervision Verbally Normal                       Personal Care Assistance Level of Assistance  Bathing, Feeding, Dressing Bathing Assistance: Limited assistance Feeding assistance: Independent Dressing Assistance: Limited assistance     Functional Limitations Info  Sight, Hearing, Speech Sight Info: Adequate Hearing Info: Adequate Speech Info: Adequate    SPECIAL CARE FACTORS FREQUENCY                       Contractures      Additional Factors Info  Code Status,  Allergies Code Status Info:  (Full Code. ) Allergies Info:  (Penicillins)          Discharge Medications: Please see discharge summary for a list of discharge medications. Current Discharge Medication List       START taking these medications   Details  diphenhydrAMINE (BENADRYL) 25 mg capsule Take 1 capsule (25 mg total) by mouth every 8 (eight) hours as needed for itching (rash). Qty: 20 capsule, Refills: 0    doxycycline (VIBRA-TABS) 100 MG tablet Take 1 tablet (100 mg total) by mouth 2 (two) times daily with a meal. Qty: 56 tablet, Refills: 0         CONTINUE these medications which have NOT CHANGED   Details  Cholecalciferol (VITAMIN D3) 2000 units capsule Take 4,000 Units by mouth daily.     !! divalproex (DEPAKOTE ER) 250 MG 24 hr tablet Take 250 mg by mouth at bedtime.    !! divalproex (DEPAKOTE ER) 500 MG 24 hr tablet Take 1,000 mg by mouth at bedtime.    gabapentin (NEURONTIN) 300 MG capsule Take 300 mg by mouth 3 (three) times daily.    Melatonin 5 MG TABS Take 5 mg by mouth at bedtime.    OLANZapine (ZYPREXA) 10 MG tablet Take 10 mg by mouth 2 (two) times daily.  Relevant Imaging Results: Relevant Lab Results: Additional Information  (SSN: [id number])  Damario Gillie, Veronia Beets, LCSW

## 2017-02-27 NOTE — Discharge Summary (Signed)
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 We'll defer to primary care physician  SECONDARY DIAGNOSIS:   Past Medical History:  Diagnosis Date  . Cancer [facility])    Prostate cancer  . Schizophrenia ([place])   . Syphilis     HOSPITAL COURSE:  Henry Smith a 76 y.o. malewith a known history of Schizophrenia from a group home, history of prostate cancer, recent admission to [facility] on 01/28/2017 and discharged on 02/04/2017 for altered mental status and was diagnosed to have latent syphilis he sent in today secondary to altered mental status again associated with significant maculopapular rash all over.  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 Patient had extensive workup done regarding it. He was seen by Dr. Ola Spurr -Does not recommend any further workup other than put patient on doxycycline 100 mg twice a day for 28 days -When necessary bed Benadryl for itching - steroid one dose, benadryl prn and monitor Avoid PCNs--- listed as allergy  #2 Syphilis- diagnosed last month with serum RPR and treponema Ab positive, no neuro syphilis noted Tolerated first dose in the hospital OK. Received 2nd dose recently and now with significant rash - ID consult appreciated. Recommend doxycycline 100 mg twice a  day for 28 days  #3 AMS- ? Metabolic encephalopathy from chronic hypercalcemia, drug reaction with PCN LP last month and no neuro syphilis.  -Patient clearly has underlying psych issue. He does not have symptoms of psychosis at present. No focal neuro deficits. I will hold off on MRI. Recent MRI month ago was normal.  #4 Schizophrenia- continue zyprexa and depakote Discussed with Dr. Weber Cooks to see patient.  #5 patient has chronic hypercalcemia needs endocrinology follow-up as outpatient. He is received a dose of IV Zometa and IV fluids. outpt endocrinology follow up recommended we'll defer this to primary care physician to take care of it. Do not have inpatient endocrinology consultation.  #6 DVT prophylaxis- lovenox  We'll DC the group home today  CONSULTS OBTAINED:  Treatment Team:  Leonel Ramsay, MD Fritzi Mandes, MD Gonzella Lex, MD  DRUG ALLERGIES:   Allergies  Allergen Reactions  . Penicillins     DISCHARGE MEDICATIONS:   Current Discharge Medication List    START taking these medications   Details  diphenhydrAMINE (BENADRYL) 25 mg capsule Take 1 capsule (25 mg total) by mouth every 8 (eight) hours as needed for itching (rash). Qty: 20 capsule, Refills: 0    doxycycline (VIBRA-TABS) 100 MG tablet Take 1 tablet (100 mg total) by mouth 2 (two) times daily with a meal. Qty: 56 tablet, Refills: 0      CONTINUE these medications which have NOT CHANGED   Details  Cholecalciferol (VITAMIN D3) 2000 units capsule Take 4,000 Units by mouth daily.     !! divalproex (DEPAKOTE ER) 250 MG 24 hr tablet Take 250 mg by mouth at bedtime.    !! divalproex (DEPAKOTE ER) 500 MG  24 hr tablet Take 1,000 mg by mouth at bedtime.    gabapentin (NEURONTIN) 300 MG capsule Take 300 mg by mouth 3 (three) times daily.    Melatonin 5 MG TABS Take 5 mg by mouth at bedtime.    OLANZapine (ZYPREXA) 10 MG tablet Take 10 mg by mouth 2 (two) times daily.     !! - Potential duplicate  medications found. Please discuss with provider.      If you experience worsening of your admission symptoms, develop shortness of breath, life threatening emergency, suicidal or homicidal thoughts you must seek medical attention immediately by calling 911 or calling your MD immediately  if symptoms less severe.  You Must read complete instructions/literature along with all the possible adverse reactions/side effects for all the Medicines you take and that have been prescribed to you. Take any new Medicines after you have completely understood and accept all the possible adverse reactions/side effects.   Please note  You were cared for by a hospitalist during your hospital stay. If you have any questions about your discharge medications or the care you received while you were in the hospital after you are discharged, you can call the unit and asked to speak with the hospitalist on call if the hospitalist that took care of you is not available. Once you are discharged, your primary care physician will handle any further medical issues. Please note that NO REFILLS for any discharge medications will be authorized once you are discharged, as it is imperative that you return to your primary care physician (or establish a relationship with a primary care physician if you do not have one) for your aftercare needs so that they can reassess your need for medications and monitor your lab values.  DATA REVIEW:   CBC   Recent Labs Lab 02/26/17 0939  WBC 8.9  HGB 15.5  HCT 48.0  PLT 115*    Chemistries   Recent Labs Lab 02/27/17 1037  NA 141  K 3.6  CL 111  CO2 27  GLUCOSE 125*  BUN 17  CREATININE 0.49*  CALCIUM 10.9*    Microbiology Results   Recent Results (from the past 240 hour(s))  MRSA PCR Screening     Status: Abnormal   Collection Time: 02/26/17  5:05 PM  Result Value Ref Range Status   MRSA by PCR POSITIVE (A) NEGATIVE Final    Comment:        The GeneXpert MRSA Assay  (FDA approved for NASAL specimens only), is one component of a comprehensive MRSA colonization surveillance program. It is not intended to diagnose MRSA infection nor to guide or monitor treatment for MRSA infections. RESULT CALLED TO, READ BACK BY AND VERIFIED WITH: JENNIFER COBLE AT 2050 02/26/17.PMH     RADIOLOGY:  Ct Head Wo Contrast  Result Date: 02/26/2017 CLINICAL DATA:  Altered mental status EXAM: CT HEAD WITHOUT CONTRAST TECHNIQUE: Contiguous axial images were obtained from the base of the skull through the vertex without intravenous contrast. COMPARISON:  MRI 01/29/2017.  CT 01/28/2017. FINDINGS: Brain: Mild age related volume loss. No acute intracranial abnormality. Specifically, no hemorrhage, hydrocephalus, mass lesion, acute infarction, or significant intracranial injury. Vascular: No hyperdense vessel or unexpected calcification. Skull: No acute calvarial abnormality. Sinuses/Orbits: Visualized paranasal sinuses and mastoids clear. Orbital soft tissues unremarkable. Other: None IMPRESSION: No acute intracranial abnormality. Electronically Signed   By: Rolm Baptise M.D.   On: 02/26/2017 10:13     Management plans discussed with the patient, family and they  are in agreement.  CODE STATUS:     Code Status Orders        Start     Ordered   02/26/17 1609  Full code  Continuous     02/26/17 1608    Code Status History    Date Active Date Inactive Code Status Order ID Comments User Context   01/28/2017  4:47 PM 02/04/2017  8:04 PM Full Code [id number]  Zada Finders, MD ED   12/06/2016  3:09 PM 12/08/2016  6:49 PM Full Code [id number]  Lytle Butte, MD ED   10/24/2016 10:34 PM 10/28/2016  6:07 PM Full Code [id number]  Harvie Bridge, DO Inpatient      TOTAL TIME TAKING CARE OF THIS PATIENT: 40 minutes.    Stacey Sago M.D on 02/27/2017 at 2:08 PM  Between 7am to 6pm - Pager - [phone] After 6pm go to [email or web address] - password [email or web address]  [facility]  Office  [phone]  CC: Primary care physician; Lorelee Market, MD

## 2017-02-27 NOTE — Progress Notes (Signed)
Attempted to call report to Mariann Laster at Group home at [phone] with voice mail message to call me back for report.

## 2017-02-27 NOTE — Progress Notes (Addendum)
Pt reports itching when ask when observed scratching extensive, red, raised rash connecting to large patches except feet, hands, head. Benadryl given with stated benefit. Eating well/drinking well. Pt impulsive with limited understanding and unable to maintain IV site for hydration. Takes po meds readily. Pt to be discharged with PCR MRSA care began with contact precautions continued. Preparing for discharge back to group home.

## 2017-02-27 NOTE — Progress Notes (Signed)
Notified Dr. Posey Pronto that pt pulled his IV out and refused restart. Advised to document and will await lab results.

## 2017-02-27 NOTE — Progress Notes (Signed)
Pt states he is agreeable to IV restart and labs. IV restarted with pt tolerating well. Wrapped site with kling dressing with pt education done and verbalized agreement.

## 2017-02-27 NOTE — Progress Notes (Signed)
Patient pulled IV out. Patient refusing to have another IV. This nurse, along with [facility] and ED nurse attempted to educate and get a new IV. MD notified.

## 2017-02-27 NOTE — Progress Notes (Signed)
[place] at [place] NAME: Constance Hackenberg    MR#:  [id number]  DATE OF BIRTH:  1941-10-31  SUBJECTIVE:  Came in with diffuse rash over the body from group home.  he intermittently pulled his IV site without. Eating well. Does not like to get blood drawn for lab No family at bedside He is a poor historian. Does not answer questions despite asking multiple times.  REVIEW OF SYSTEMS:   Review of Systems  Unable to perform ROS: Medical condition   Tolerating Diet:very well  DRUG ALLERGIES:   Allergies  Allergen Reactions  . Penicillins     VITALS:  Blood pressure 114/65, pulse 86, temperature 97.5 F (36.4 C), temperature source Oral, resp. rate 19, height 5\' 10"  (1.778 m), weight 103.7 kg (228 lb 11.2 oz), SpO2 96 %.  PHYSICAL EXAMINATION:   Physical Exam  GENERAL:  76 y.o.-year-old patient lying in the bed with no acute distress.  EYES: Pupils equal, round, reactive to light and accommodation. No scleral icterus. Extraocular muscles intact.  HEENT: Head atraumatic, normocephalic. Oropharynx and nasopharynx clear.  NECK:  Supple, no jugular venous distention. No thyroid enlargement, no tenderness.  LUNGS: Normal breath sounds bilaterally, no wheezing, rales, rhonchi. No use of accessory muscles of respiration.  CARDIOVASCULAR: S1, S2 normal. No murmurs, rubs, or gallops.  ABDOMEN: Soft, nontender, nondistended. Bowel sounds present. No organomegaly or mass.  EXTREMITIES: No cyanosis, clubbing or edema b/l.    NEUROLOGIC: Cranial nerves II through XII are intact. No focal Motor or sensory deficits b/l.   PSYCHIATRIC:  patient is alert and oriented x 3.  SKIN: dry skin with scratch marks++ on the hand with maculopapular rash. No cellulitis  LABORATORY PANEL:  CBC  Recent Labs Lab 02/26/17 0939  WBC 8.9  HGB 15.5  HCT 48.0  PLT 115*    Chemistries   Recent Labs Lab 02/27/17 1037  NA 141  K 3.6  CL 111   CO2 27  GLUCOSE 125*  BUN 17  CREATININE 0.49*  CALCIUM 10.9*   Cardiac Enzymes No results for input(s): TROPONINI in the last 168 hours. RADIOLOGY:  Ct Head Wo Contrast  Result Date: 02/26/2017 CLINICAL DATA:  Altered mental status EXAM: CT HEAD WITHOUT CONTRAST TECHNIQUE: Contiguous axial images were obtained from the base of the skull through the vertex without intravenous contrast. COMPARISON:  MRI 01/29/2017.  CT 01/28/2017. FINDINGS: Brain: Mild age related volume loss. No acute intracranial abnormality. Specifically, no hemorrhage, hydrocephalus, mass lesion, acute infarction, or significant intracranial injury. Vascular: No hyperdense vessel or unexpected calcification. Skull: No acute calvarial abnormality. Sinuses/Orbits: Visualized paranasal sinuses and mastoids clear. Orbital soft tissues unremarkable. Other: None IMPRESSION: No acute intracranial abnormality. Electronically Signed   By: Rolm Baptise M.D.   On: 02/26/2017 10:13   ASSESSMENT AND PLAN:  Keawe Marcello  is a 76 y.o. male with a known history of Schizophrenia from a group home, history of prostate cancer, recent admission to [facility] on 01/28/2017 and discharged on 02/04/2017 for altered mental status and was diagnosed to have latent syphilis he sent in today secondary to altered mental status again associated with significant maculopapular rash all over.  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 Patient had extensive workup done regarding it. He was seen by Dr. Ola Spurr -Does not recommend any further workup other than put patient on doxycycline 100 mg twice a day for 28 days -When  necessary bed Benadryl for itching - steroid one dose, benadryl prn and monitor Avoid PCNs  #2 Syphilis- diagnosed last month with serum RPR and treponema Ab positive, no neuro syphilis noted Tolerated first dose in the hospital [place]. Received 2nd dose recently and now with  significant rash - ID consult appreciated. Recommend doxycycline 100 mg twice a day for 28 days  #3 AMS- ? Metabolic encephalopathy from chronic hypercalcemia, drug reaction with PCN LP last month and no neuro syphilis.  -Patient clearly has underlying psych issue. He does not have symptoms of psychosis at present. No focal neuro deficits. I will hold off on MRI. Recent MRI month ago was normal.  #4 Schizophrenia- continue zyprexa and depakote Discussed with Dr. Weber Cooks to see patient.  #5 patient has chronic hypocalcemia needs endocrinology follow-up as outpatient. He is received a dose of IV Zometa and IV fluids. outpt endocrinology follow up recommended we'll defer this to primary care physician to take care of it. Do not have inpatient endocrinology consultation.  #6 DVT prophylaxis- lovenox  If continues to stay stable we'll consider discharging back to the group home social worker consult placed.  Case discussed with Care Management/Social Worker. Management plans discussed with the patient, family and they are in agreement.  CODE STATUS: full  DVT Prophylaxis: lovenox  TOTAL TIME TAKING CARE OF THIS PATIENT: 30 minutes.  >50% time spent on counselling and coordination of care  POSSIBLE D/C IN 1 DAYS, DEPENDING ON CLINICAL CONDITION.  Note: This dictation was prepared with Dragon dictation along with smaller phrase technology. Any transcriptional errors that result from this process are unintentional.  Ashawn Rinehart M.D on 02/27/2017 at 1:23 PM  Between 7am to 6pm - Pager - [phone]  After 6pm go to [email or web address] - password [email or web address] [place] [facility]  Office  [phone]  CC: Primary care physician; Lorelee Market, MD

## 2017-02-27 NOTE — Progress Notes (Signed)
Clinical Education officer, museum (CSW) received call from MD stating that patient is from a group home and is ready for D/C today. CSW contacted patient's guardian [name]. Per guardian patient is a ward of the state and can return to the [facility] group home today. CSW contacted Mariann Laster group home nurse who reported that she will send a cab to pick patient up today. CSW faxed prescriptions to the pharmacy that [facility] requested. CSW prepared D/C packet and RN will call report to [place]. Please reconsult if future social work needs arise. CSW signing off.   [employer], LCSW [phone]

## 2017-02-27 NOTE — Progress Notes (Signed)
Bed alarm activated. Found IV lying on bedside table with kling wrap in floor. Pt refused another IV restart. Will notify MD.

## 2017-02-27 NOTE — Progress Notes (Signed)
RTC from [facility] from the group home with report given; pt accepted. She has called Cab for pt transport with the cab to call upon arrival.  Discharge packet ready for discharge.

## 2017-02-28 ENCOUNTER — Ambulatory Visit (HOSPITAL_COMMUNITY)
Admission: AD | Admit: 2017-02-28 | Discharge: 2017-02-28 | Disposition: A | Discharge status: Home / Self Care | Payer: Medicare Other | Source: Other Acute Inpatient Hospital | Attending: Emergency Medicine | Admitting: Emergency Medicine

## 2017-02-28 ENCOUNTER — Emergency Department
Admission: EM | Admit: 2017-02-28 | Discharge: 2017-02-28 | Disposition: A | Discharge status: Other Acute Inpatient Hospital | Payer: Medicare Other | Attending: Emergency Medicine | Admitting: Emergency Medicine

## 2017-02-28 ENCOUNTER — Emergency Department: Payer: Medicare Other

## 2017-02-28 DIAGNOSIS — F209 Schizophrenia, unspecified: Secondary | ICD-10-CM | POA: Insufficient documentation

## 2017-02-28 DIAGNOSIS — Z79899 Other long term (current) drug therapy: Secondary | ICD-10-CM | POA: Insufficient documentation

## 2017-02-28 DIAGNOSIS — A539 Syphilis, unspecified: Secondary | ICD-10-CM | POA: Diagnosis not present

## 2017-02-28 DIAGNOSIS — Z8546 Personal history of malignant neoplasm of prostate: Secondary | ICD-10-CM | POA: Diagnosis not present

## 2017-02-28 DIAGNOSIS — R509 Fever, unspecified: Secondary | ICD-10-CM | POA: Insufficient documentation

## 2017-02-28 DIAGNOSIS — C61 Malignant neoplasm of prostate: Secondary | ICD-10-CM | POA: Diagnosis not present

## 2017-02-28 DIAGNOSIS — R21 Rash and other nonspecific skin eruption: Secondary | ICD-10-CM | POA: Insufficient documentation

## 2017-02-28 DIAGNOSIS — F17210 Nicotine dependence, cigarettes, uncomplicated: Secondary | ICD-10-CM | POA: Insufficient documentation

## 2017-02-28 LAB — CBC WITH DIFFERENTIAL/PLATELET
Basophils Absolute: 0 10*3/uL (ref 0–0.1)
Basophils Relative: 0 %
EOS ABS: 0.2 10*3/uL (ref 0–0.7)
Eosinophils Relative: 5 %
HCT: 47.4 % (ref 40.0–52.0)
Hemoglobin: 15.3 g/dL (ref 13.0–18.0)
LYMPHS ABS: 0.3 10*3/uL — AB (ref 1.0–3.6)
Lymphocytes Relative: 6 %
MCH: 28 pg (ref 26.0–34.0)
MCHC: 32.4 g/dL (ref 32.0–36.0)
MCV: 86.4 fL (ref 80.0–100.0)
MONOS PCT: 7 %
Monocytes Absolute: 0.4 10*3/uL (ref 0.2–1.0)
NEUTROS PCT: 82 %
Neutro Abs: 4.3 10*3/uL (ref 1.4–6.5)
Platelets: 117 10*3/uL — ABNORMAL LOW (ref 150–440)
RBC: 5.48 MIL/uL (ref 4.40–5.90)
RDW: 15.2 % — AB (ref 11.5–14.5)
WBC: 5.2 10*3/uL (ref 3.8–10.6)

## 2017-02-28 LAB — URINALYSIS, COMPLETE (UACMP) WITH MICROSCOPIC
Bacteria, UA: NONE SEEN
Bilirubin Urine: NEGATIVE
Glucose, UA: NEGATIVE mg/dL
Hgb urine dipstick: NEGATIVE
KETONES UR: 20 mg/dL — AB
Leukocytes, UA: NEGATIVE
Nitrite: NEGATIVE
PROTEIN: NEGATIVE mg/dL
Specific Gravity, Urine: 1.025 (ref 1.005–1.030)
pH: 5 (ref 5.0–8.0)

## 2017-02-28 LAB — COMPREHENSIVE METABOLIC PANEL
ALBUMIN: 3.5 g/dL (ref 3.5–5.0)
ALK PHOS: 53 U/L (ref 38–126)
ALT: 18 U/L (ref 17–63)
AST: 23 U/L (ref 15–41)
Anion gap: 6 (ref 5–15)
BUN: 14 mg/dL (ref 6–20)
CHLORIDE: 108 mmol/L (ref 101–111)
CO2: 27 mmol/L (ref 22–32)
CREATININE: 0.9 mg/dL (ref 0.61–1.24)
Calcium: 10.8 mg/dL — ABNORMAL HIGH (ref 8.9–10.3)
GFR calc non Af Amer: >60 mL/min (ref >60)
GLUCOSE: 86 mg/dL (ref 65–99)
Potassium: 3.9 mmol/L (ref 3.5–5.1)
Sodium: 141 mmol/L (ref 135–145)
Total Bilirubin: 0.8 mg/dL (ref 0.3–1.2)
Total Protein: 6.9 g/dL (ref 6.5–8.1)

## 2017-02-28 LAB — LACTIC ACID, PLASMA: LACTIC ACID, VENOUS: 1.8 mmol/L (ref 0.5–1.9)

## 2017-02-28 MED ORDER — ACETAMINOPHEN 500 MG PO TABS
ORAL_TABLET | ORAL | Status: AC
  Filled 2017-02-28: qty 2

## 2017-02-28 MED ORDER — ACETAMINOPHEN 650 MG RE SUPP
650.0000 mg | RECTAL | Status: DC | PRN
  Filled 2017-02-28: qty 1

## 2017-02-28 MED ORDER — SODIUM CHLORIDE 0.9 % IV BOLUS (SEPSIS)
1000.0000 mL | Freq: Once | INTRAVENOUS | Status: AC
  Administered 2017-02-28: 1000 mL via INTRAVENOUS

## 2017-02-28 NOTE — ED Notes (Signed)
Suppository given since pt was unable to swallow pill. Pt just kept pill in mouth and didn't move it.

## 2017-02-28 NOTE — ED Triage Notes (Signed)
Pt to ED by EMS from [facility]. Pt recently admitted and hospitalized for AMS after a possible allergic reaction. Pt released with rx for benadryl and doxycycline. Pt febrile and tachycardic at this time.
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
## 2017-03-01 LAB — URINE CULTURE: Culture: NO GROWTH

## 2017-03-03 ENCOUNTER — Encounter: Payer: Self-pay | Admitting: Infectious Diseases

## 2017-03-03 DIAGNOSIS — L270 Generalized skin eruption due to drugs and medicaments taken internally: Secondary | ICD-10-CM | POA: Insufficient documentation

## 2017-03-04 ENCOUNTER — Inpatient Hospital Stay: Payer: Medicare Other | Admitting: Hematology and Oncology

## 2017-03-04 ENCOUNTER — Inpatient Hospital Stay: Payer: Medicare Other

## 2017-03-05 LAB — CULTURE, BLOOD (ROUTINE X 2)
CULTURE: NO GROWTH
Culture: NO GROWTH

## 2017-04-05 ENCOUNTER — Ambulatory Visit (INDEPENDENT_AMBULATORY_CARE_PROVIDER_SITE_OTHER): Payer: Medicare Other | Admitting: Urology

## 2017-04-05 ENCOUNTER — Encounter: Payer: Self-pay | Admitting: Urology

## 2017-04-05 VITALS — BP 120/79 | HR 97 | Ht 70.0 in | Wt 215.0 lb

## 2017-04-05 DIAGNOSIS — C61 Malignant neoplasm of prostate: Secondary | ICD-10-CM

## 2017-04-05 DIAGNOSIS — I639 Cerebral infarction, unspecified: Secondary | ICD-10-CM

## 2017-04-05 DIAGNOSIS — N3941 Urge incontinence: Secondary | ICD-10-CM | POA: Diagnosis not present

## 2017-04-05 DIAGNOSIS — Z8546 Personal history of malignant neoplasm of prostate: Secondary | ICD-10-CM

## 2017-04-05 LAB — BLADDER SCAN AMB NON-IMAGING
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
## 2017-04-05 NOTE — Progress Notes (Signed)
psa

## 2017-04-06 ENCOUNTER — Ambulatory Visit: Payer: Self-pay | Admitting: Urology

## 2017-04-06 LAB — PSA: Prostate Specific Ag, Serum: 1.4 ng/mL (ref 0.0–4.0)

## 2017-04-26 ENCOUNTER — Other Ambulatory Visit: Payer: Self-pay | Admitting: *Deleted

## 2017-04-26 ENCOUNTER — Inpatient Hospital Stay: Payer: Medicare Other | Attending: Hematology and Oncology

## 2017-04-26 ENCOUNTER — Inpatient Hospital Stay (HOSPITAL_BASED_OUTPATIENT_CLINIC_OR_DEPARTMENT_OTHER): Payer: Medicare Other | Admitting: Hematology and Oncology

## 2017-04-26 VITALS — BP 120/76 | HR 71 | Temp 97.6°F | Resp 16 | Ht 70.0 in | Wt 221.6 lb

## 2017-04-26 DIAGNOSIS — D696 Thrombocytopenia, unspecified: Secondary | ICD-10-CM | POA: Diagnosis not present

## 2017-04-26 DIAGNOSIS — C61 Malignant neoplasm of prostate: Secondary | ICD-10-CM

## 2017-04-26 DIAGNOSIS — F17210 Nicotine dependence, cigarettes, uncomplicated: Secondary | ICD-10-CM

## 2017-04-26 DIAGNOSIS — Z923 Personal history of irradiation: Secondary | ICD-10-CM

## 2017-04-26 DIAGNOSIS — F209 Schizophrenia, unspecified: Secondary | ICD-10-CM | POA: Insufficient documentation

## 2017-04-26 DIAGNOSIS — D72819 Decreased white blood cell count, unspecified: Secondary | ICD-10-CM

## 2017-04-26 DIAGNOSIS — Z8546 Personal history of malignant neoplasm of prostate: Secondary | ICD-10-CM

## 2017-04-26 DIAGNOSIS — E210 Primary hyperparathyroidism: Secondary | ICD-10-CM

## 2017-04-26 LAB — COMPREHENSIVE METABOLIC PANEL
ALT: 23 U/L (ref 17–63)
AST: 23 U/L (ref 15–41)
Albumin: 3.7 g/dL (ref 3.5–5.0)
Alkaline Phosphatase: 50 U/L (ref 38–126)
Anion gap: 6 (ref 5–15)
BUN: 10 mg/dL (ref 6–20)
CO2: 27 mmol/L (ref 22–32)
Calcium: 10.8 mg/dL — ABNORMAL HIGH (ref 8.9–10.3)
Chloride: 109 mmol/L (ref 101–111)
Creatinine, Ser: 0.87 mg/dL (ref 0.61–1.24)
GFR calc Af Amer: >60 mL/min (ref >60)
GFR calc non Af Amer: >60 mL/min (ref >60)
Glucose, Bld: 105 mg/dL — ABNORMAL HIGH (ref 65–99)
Potassium: 3.8 mmol/L (ref 3.5–5.1)
Sodium: 142 mmol/L (ref 135–145)
Total Bilirubin: 0.8 mg/dL (ref 0.3–1.2)
Total Protein: 6.7 g/dL (ref 6.5–8.1)

## 2017-04-26 LAB — CBC WITH DIFFERENTIAL/PLATELET
Basophils Absolute: 0 10*3/uL (ref 0–0.1)
Basophils Relative: 0 %
Eosinophils Absolute: 0.2 10*3/uL (ref 0–0.7)
Eosinophils Relative: 5 %
HCT: 43.1 % (ref 40.0–52.0)
Hemoglobin: 14.2 g/dL (ref 13.0–18.0)
Lymphocytes Relative: 28 %
Lymphs Abs: 0.8 10*3/uL — ABNORMAL LOW (ref 1.0–3.6)
MCH: 28.5 pg (ref 26.0–34.0)
MCHC: 33 g/dL (ref 32.0–36.0)
MCV: 86.1 fL (ref 80.0–100.0)
Monocytes Absolute: 0.2 10*3/uL (ref 0.2–1.0)
Monocytes Relative: 7 %
Neutro Abs: 1.8 10*3/uL (ref 1.4–6.5)
Neutrophils Relative %: 60 %
Platelets: 110 10*3/uL — ABNORMAL LOW (ref 150–440)
RBC: 5 MIL/uL (ref 4.40–5.90)
RDW: 15.3 % — ABNORMAL HIGH (ref 11.5–14.5)
WBC: 3 10*3/uL — ABNORMAL LOW (ref 3.8–10.6)

## 2017-04-26 LAB — PSA: PSA: 0.9 ng/mL (ref 0.00–4.00)
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
## 2017-04-26 NOTE — Progress Notes (Signed)
Patient here for follow up. No changes since last appointment.  

## 2017-05-03 ENCOUNTER — Emergency Department
Admission: EM | Admit: 2017-05-03 | Discharge: 2017-05-03 | Disposition: A | Discharge status: Home / Self Care | Payer: Medicare Other | Attending: Emergency Medicine | Admitting: Emergency Medicine

## 2017-05-03 ENCOUNTER — Other Ambulatory Visit: Payer: Self-pay

## 2017-05-03 ENCOUNTER — Emergency Department: Payer: Medicare Other

## 2017-05-03 DIAGNOSIS — Y9389 Activity, other specified: Secondary | ICD-10-CM | POA: Insufficient documentation

## 2017-05-03 DIAGNOSIS — F17210 Nicotine dependence, cigarettes, uncomplicated: Secondary | ICD-10-CM | POA: Diagnosis not present

## 2017-05-03 DIAGNOSIS — R55 Syncope and collapse: Secondary | ICD-10-CM | POA: Diagnosis present

## 2017-05-03 DIAGNOSIS — W07XXXA Fall from chair, initial encounter: Secondary | ICD-10-CM | POA: Diagnosis not present

## 2017-05-03 DIAGNOSIS — Z79899 Other long term (current) drug therapy: Secondary | ICD-10-CM | POA: Diagnosis not present

## 2017-05-03 DIAGNOSIS — Z8546 Personal history of malignant neoplasm of prostate: Secondary | ICD-10-CM | POA: Diagnosis not present

## 2017-05-03 DIAGNOSIS — Y999 Unspecified external cause status: Secondary | ICD-10-CM | POA: Insufficient documentation

## 2017-05-03 DIAGNOSIS — Y929 Unspecified place or not applicable: Secondary | ICD-10-CM | POA: Diagnosis not present

## 2017-05-03 DIAGNOSIS — D696 Thrombocytopenia, unspecified: Secondary | ICD-10-CM | POA: Diagnosis not present

## 2017-05-03 LAB — CBC WITH DIFFERENTIAL/PLATELET
BASOS ABS: 0 10*3/uL (ref 0–0.1)
Basophils Relative: 1 %
EOS PCT: 2 %
Eosinophils Absolute: 0.1 10*3/uL (ref 0–0.7)
HCT: 48.1 % (ref 40.0–52.0)
Hemoglobin: 15.5 g/dL (ref 13.0–18.0)
LYMPHS PCT: 26 %
Lymphs Abs: 0.8 10*3/uL — ABNORMAL LOW (ref 1.0–3.6)
MCH: 28.6 pg (ref 26.0–34.0)
MCHC: 32.3 g/dL (ref 32.0–36.0)
MCV: 88.7 fL (ref 80.0–100.0)
Monocytes Absolute: 0.3 10*3/uL (ref 0.2–1.0)
Monocytes Relative: 11 %
NEUTROS ABS: 1.8 10*3/uL (ref 1.4–6.5)
Neutrophils Relative %: 60 %
PLATELETS: 113 10*3/uL — AB (ref 150–440)
RBC: 5.42 MIL/uL (ref 4.40–5.90)
RDW: 15.2 % — ABNORMAL HIGH (ref 11.5–14.5)
WBC: 2.9 10*3/uL — AB (ref 3.8–10.6)

## 2017-05-03 LAB — COMPREHENSIVE METABOLIC PANEL
ALBUMIN: 3.9 g/dL (ref 3.5–5.0)
ALT: 15 U/L — AB (ref 17–63)
AST: 16 U/L (ref 15–41)
Alkaline Phosphatase: 59 U/L (ref 38–126)
Anion gap: 4 — ABNORMAL LOW (ref 5–15)
BUN: 11 mg/dL (ref 6–20)
CHLORIDE: 110 mmol/L (ref 101–111)
CO2: 31 mmol/L (ref 22–32)
CREATININE: 0.85 mg/dL (ref 0.61–1.24)
Calcium: 11 mg/dL — ABNORMAL HIGH (ref 8.9–10.3)
GFR calc Af Amer: >60 mL/min (ref >60)
Glucose, Bld: 90 mg/dL (ref 65–99)
Potassium: 3.7 mmol/L (ref 3.5–5.1)
SODIUM: 145 mmol/L (ref 135–145)
Total Bilirubin: 0.4 mg/dL (ref 0.3–1.2)
Total Protein: 7 g/dL (ref 6.5–8.1)

## 2017-05-03 LAB — URINALYSIS, COMPLETE (UACMP) WITH MICROSCOPIC
BACTERIA UA: NONE SEEN
BILIRUBIN URINE: NEGATIVE
Glucose, UA: NEGATIVE mg/dL
HGB URINE DIPSTICK: NEGATIVE
KETONES UR: 5 mg/dL — AB
Leukocytes, UA: NEGATIVE
Nitrite: NEGATIVE
Protein, ur: NEGATIVE mg/dL
Specific Gravity, Urine: 1.018 (ref 1.005–1.030)
pH: 6 (ref 5.0–8.0)

## 2017-05-03 LAB — URINE DRUG SCREEN, QUALITATIVE (ARMC ONLY)
Amphetamines, Ur Screen: NOT DETECTED
Barbiturates, Ur Screen: NOT DETECTED
Benzodiazepine, Ur Scrn: NOT DETECTED
CANNABINOID 50 NG, UR ~~LOC~~: NOT DETECTED
COCAINE METABOLITE, UR ~~LOC~~: NOT DETECTED
MDMA (ECSTASY) UR SCREEN: NOT DETECTED
Methadone Scn, Ur: NOT DETECTED
OPIATE, UR SCREEN: NOT DETECTED
PHENCYCLIDINE (PCP) UR S: NOT DETECTED
Tricyclic, Ur Screen: NOT DETECTED

## 2017-05-03 LAB — ETHANOL: Alcohol, Ethyl (B): <5 mg/dL (ref <5)

## 2017-05-03 LAB — TROPONIN I

## 2017-05-03 MED ORDER — SODIUM CHLORIDE 0.9 % IV BOLUS (SEPSIS)
1000.0000 mL | Freq: Once | INTRAVENOUS | Status: AC
  Administered 2017-05-03: 1000 mL via INTRAVENOUS

## 2017-05-03 NOTE — ED Notes (Signed)
Contacted [employer] (owner of facility) at [phone] and she instructed to send the patient back by cab and they will make sure he gets into the facility safely from the cab.

## 2017-05-03 NOTE — ED Notes (Signed)
Pt. Verbalizes understanding of d/c instructions and follow-up. VS stable and pt denies pain.  Pt. In NAD at time of d/c and denies further concerns regarding this visit. Pt. Stable at the time of departure from the unit, departing unit by the safest and most appropriate manner per that pt condition and limitations and per facility recommendations. Pt and facility advised to return to the ED at any time for emergent concerns, or for new/worsening symptoms.
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
## 2017-05-03 NOTE — ED Triage Notes (Addendum)
Pt to ED from [facility] via [facility] c/o fall. EMS reports pt found on the ground from an unwitnessed fall, incontinent, and LOC unknown. Pt reported to EMS that he was dizzy, but denies dizziness and pain at this time. Pt reports the leg on the chair broke resulting in him falling; pt alert and oriented in no acute distress.

## 2017-05-03 NOTE — ED Notes (Signed)
PT given sandwich tray and water at this time

## 2017-05-03 NOTE — ED Notes (Signed)
Attempt to call [facility] x4. No answer, mailbox full. Will attempt call back.

## 2017-05-06 ENCOUNTER — Emergency Department: Payer: Medicare Other

## 2017-05-06 ENCOUNTER — Observation Stay
Admission: EM | Admit: 2017-05-06 | Discharge: 2017-05-07 | Disposition: A | Discharge status: Home / Self Care | Payer: Medicare Other | Attending: Specialist | Admitting: Specialist

## 2017-05-06 ENCOUNTER — Other Ambulatory Visit: Payer: Self-pay

## 2017-05-06 ENCOUNTER — Observation Stay: Payer: Medicare Other

## 2017-05-06 ENCOUNTER — Encounter: Payer: Self-pay | Admitting: Emergency Medicine

## 2017-05-06 DIAGNOSIS — F209 Schizophrenia, unspecified: Secondary | ICD-10-CM | POA: Diagnosis not present

## 2017-05-06 DIAGNOSIS — Z880 Allergy status to penicillin: Secondary | ICD-10-CM | POA: Insufficient documentation

## 2017-05-06 DIAGNOSIS — Z8546 Personal history of malignant neoplasm of prostate: Secondary | ICD-10-CM | POA: Diagnosis not present

## 2017-05-06 DIAGNOSIS — G629 Polyneuropathy, unspecified: Secondary | ICD-10-CM | POA: Insufficient documentation

## 2017-05-06 DIAGNOSIS — R2689 Other abnormalities of gait and mobility: Principal | ICD-10-CM | POA: Insufficient documentation

## 2017-05-06 DIAGNOSIS — I5033 Acute on chronic diastolic (congestive) heart failure: Secondary | ICD-10-CM | POA: Insufficient documentation

## 2017-05-06 DIAGNOSIS — I639 Cerebral infarction, unspecified: Secondary | ICD-10-CM

## 2017-05-06 DIAGNOSIS — Z79899 Other long term (current) drug therapy: Secondary | ICD-10-CM | POA: Diagnosis not present

## 2017-05-06 DIAGNOSIS — G459 Transient cerebral ischemic attack, unspecified: Secondary | ICD-10-CM

## 2017-05-06 DIAGNOSIS — R200 Anesthesia of skin: Secondary | ICD-10-CM | POA: Diagnosis not present

## 2017-05-06 DIAGNOSIS — F17210 Nicotine dependence, cigarettes, uncomplicated: Secondary | ICD-10-CM | POA: Diagnosis not present

## 2017-05-06 DIAGNOSIS — R269 Unspecified abnormalities of gait and mobility: Secondary | ICD-10-CM

## 2017-05-06 DIAGNOSIS — H409 Unspecified glaucoma: Secondary | ICD-10-CM | POA: Insufficient documentation

## 2017-05-06 DIAGNOSIS — R531 Weakness: Secondary | ICD-10-CM

## 2017-05-06 DIAGNOSIS — R29705 NIHSS score 5: Secondary | ICD-10-CM | POA: Diagnosis not present

## 2017-05-06 LAB — URINALYSIS, COMPLETE (UACMP) WITH MICROSCOPIC
Bacteria, UA: NONE SEEN
Bilirubin Urine: NEGATIVE
Glucose, UA: NEGATIVE mg/dL
Hgb urine dipstick: NEGATIVE
Ketones, ur: 5 mg/dL — AB
Leukocytes, UA: NEGATIVE
Nitrite: NEGATIVE
PROTEIN: NEGATIVE mg/dL
Specific Gravity, Urine: 1.029 (ref 1.005–1.030)
pH: 5 (ref 5.0–8.0)

## 2017-05-06 LAB — VALPROIC ACID LEVEL: Valproic Acid Lvl: 100 ug/mL (ref 50.0–100.0)

## 2017-05-06 LAB — BASIC METABOLIC PANEL
Anion gap: 7 (ref 5–15)
BUN: 10 mg/dL (ref 6–20)
CHLORIDE: 107 mmol/L (ref 101–111)
CO2: 25 mmol/L (ref 22–32)
Calcium: 10.2 mg/dL (ref 8.9–10.3)
Creatinine, Ser: 0.78 mg/dL (ref 0.61–1.24)
GFR calc Af Amer: >60 mL/min (ref >60)
GFR calc non Af Amer: >60 mL/min (ref >60)
GLUCOSE: 127 mg/dL — AB (ref 65–99)
POTASSIUM: 4.3 mmol/L (ref 3.5–5.1)
Sodium: 139 mmol/L (ref 135–145)

## 2017-05-06 LAB — CBC
HCT: 43.3 % (ref 40.0–52.0)
Hemoglobin: 13.8 g/dL (ref 13.0–18.0)
MCH: 27.7 pg (ref 26.0–34.0)
MCHC: 31.8 g/dL — AB (ref 32.0–36.0)
MCV: 87 fL (ref 80.0–100.0)
Platelets: 95 10*3/uL — ABNORMAL LOW (ref 150–440)
RBC: 4.98 MIL/uL (ref 4.40–5.90)
RDW: 15 % — ABNORMAL HIGH (ref 11.5–14.5)
WBC: 3.5 10*3/uL — ABNORMAL LOW (ref 3.8–10.6)

## 2017-05-06 LAB — CK: Total CK: 46 U/L — ABNORMAL LOW (ref 49–397)

## 2017-05-06 LAB — MRSA PCR SCREENING: MRSA by PCR: NEGATIVE

## 2017-05-06 LAB — TROPONIN I: Troponin I: <0.03 ng/mL (ref <0.03)

## 2017-05-06 LAB — BRAIN NATRIURETIC PEPTIDE: B NATRIURETIC PEPTIDE 5: 31 pg/mL (ref 0.0–100.0)

## 2017-05-06 MED ORDER — SODIUM CHLORIDE 0.9% FLUSH: 3.0000 mL | INTRAVENOUS | Status: DC | PRN

## 2017-05-06 MED ORDER — POLYETHYLENE GLYCOL 3350 17 G PO PACK: 17.0000 g | PACK | Freq: Every day | ORAL | Status: DC | PRN

## 2017-05-06 MED ORDER — ACETAMINOPHEN 325 MG PO TABS: 650.0000 mg | ORAL_TABLET | Freq: Four times a day (QID) | ORAL | Status: DC | PRN

## 2017-05-06 MED ORDER — ENOXAPARIN SODIUM 40 MG/0.4ML ~~LOC~~ SOLN
40.0000 mg | SUBCUTANEOUS | Status: DC
  Administered 2017-05-06: 21:00:00 40 mg via SUBCUTANEOUS
  Filled 2017-05-06: qty 0.4

## 2017-05-06 MED ORDER — BENZTROPINE MESYLATE 1 MG PO TABS
1.0000 mg | ORAL_TABLET | Freq: Two times a day (BID) | ORAL | Status: DC
  Administered 2017-05-06 – 2017-05-07 (×2): 1 mg via ORAL
  Filled 2017-05-06 (×3): qty 1

## 2017-05-06 MED ORDER — SODIUM CHLORIDE 0.9% FLUSH: 3.0000 mL | Freq: Two times a day (BID) | INTRAVENOUS | Status: DC

## 2017-05-06 MED ORDER — SODIUM CHLORIDE 0.9% FLUSH
3.0000 mL | Freq: Two times a day (BID) | INTRAVENOUS | Status: DC
  Administered 2017-05-06 – 2017-05-07 (×2): 3 mL via INTRAVENOUS

## 2017-05-06 MED ORDER — ASPIRIN 325 MG PO TABS
325.0000 mg | ORAL_TABLET | Freq: Every day | ORAL | Status: DC
  Administered 2017-05-06 – 2017-05-07 (×2): 325 mg via ORAL
  Filled 2017-05-06 (×2): qty 1

## 2017-05-06 MED ORDER — OLANZAPINE 10 MG PO TABS
10.0000 mg | ORAL_TABLET | Freq: Two times a day (BID) | ORAL | Status: DC
  Administered 2017-05-06 – 2017-05-07 (×2): 10 mg via ORAL
  Filled 2017-05-06 (×3): qty 1

## 2017-05-06 MED ORDER — DIVALPROEX SODIUM ER 250 MG PO TB24
250.0000 mg | ORAL_TABLET | Freq: Every day | ORAL | Status: DC
  Administered 2017-05-06: 21:00:00 250 mg via ORAL
  Filled 2017-05-06: qty 1

## 2017-05-06 MED ORDER — ASPIRIN EC 81 MG PO TBEC: 81.0000 mg | DELAYED_RELEASE_TABLET | Freq: Every day | ORAL | Status: DC

## 2017-05-06 MED ORDER — ONDANSETRON HCL 4 MG PO TABS: 4.0000 mg | ORAL_TABLET | Freq: Four times a day (QID) | ORAL | Status: DC | PRN

## 2017-05-06 MED ORDER — LATANOPROST 0.005 % OP SOLN
1.0000 [drp] | Freq: Every day | OPHTHALMIC | Status: DC
  Administered 2017-05-06: 21:00:00 1 [drp] via OPHTHALMIC
  Filled 2017-05-06: qty 2.5

## 2017-05-06 MED ORDER — ALBUTEROL SULFATE (2.5 MG/3ML) 0.083% IN NEBU: 2.5000 mg | INHALATION_SOLUTION | RESPIRATORY_TRACT | Status: DC | PRN

## 2017-05-06 MED ORDER — FUROSEMIDE 10 MG/ML IJ SOLN
40.0000 mg | Freq: Once | INTRAMUSCULAR | Status: AC
  Administered 2017-05-06: 40 mg via INTRAVENOUS
  Filled 2017-05-06: qty 4

## 2017-05-06 MED ORDER — ONDANSETRON HCL 4 MG/2ML IJ SOLN: 4.0000 mg | Freq: Four times a day (QID) | INTRAMUSCULAR | Status: DC | PRN

## 2017-05-06 MED ORDER — SODIUM CHLORIDE 0.9 % IV SOLN: 250.0000 mL | INTRAVENOUS | Status: DC | PRN

## 2017-05-06 MED ORDER — DIVALPROEX SODIUM ER 500 MG PO TB24
1000.0000 mg | ORAL_TABLET | Freq: Every day | ORAL | Status: DC
  Administered 2017-05-06: 1000 mg via ORAL
  Filled 2017-05-06: qty 2

## 2017-05-06 MED ORDER — ATORVASTATIN CALCIUM 20 MG PO TABS
40.0000 mg | ORAL_TABLET | Freq: Every day | ORAL | Status: DC
  Administered 2017-05-06: 40 mg via ORAL
  Filled 2017-05-06: qty 2

## 2017-05-06 MED ORDER — GABAPENTIN 300 MG PO CAPS
300.0000 mg | ORAL_CAPSULE | Freq: Three times a day (TID) | ORAL | Status: DC
  Administered 2017-05-06 – 2017-05-07 (×2): 300 mg via ORAL
  Filled 2017-05-06 (×2): qty 1

## 2017-05-06 MED ORDER — ACETAMINOPHEN 650 MG RE SUPP: 650.0000 mg | Freq: Four times a day (QID) | RECTAL | Status: DC | PRN

## 2017-05-06 NOTE — ED Notes (Signed)
Pts money was placed in a cup and placed back in his pants pocket. Pt wanted money counted and it was 70.00 Nurse S.Jones counted money and placed it in a cup.
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
## 2017-05-06 NOTE — ED Notes (Signed)
Floor notified that patient was on the way

## 2017-05-06 NOTE — ED Notes (Signed)
Purple top redrew per lab request

## 2017-05-06 NOTE — ED Triage Notes (Signed)
Ems brought from [facility].  Person who called 911 was not there,  Person out front said patient is not walking as well.  Patient is easily arrouseable, but falls asleep when left alone.  In nad.  resp non labored.  ekg done

## 2017-05-06 NOTE — ED Notes (Signed)
[facility] updated that patient has been taken to MRI and he will be brought to them once he has returned to ED
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
## 2017-05-06 NOTE — Progress Notes (Signed)
Pt admitted from the ED. VSS. NIH 5. Denies pain.

## 2017-05-06 NOTE — ED Notes (Signed)
Patient taken to MRI

## 2017-05-06 NOTE — ED Notes (Signed)
Patient transported to X-ray 

## 2017-05-07 ENCOUNTER — Observation Stay: Payer: Medicare Other

## 2017-05-07 ENCOUNTER — Observation Stay (HOSPITAL_BASED_OUTPATIENT_CLINIC_OR_DEPARTMENT_OTHER)
Admit: 2017-05-07 | Discharge: 2017-05-07 | Disposition: A | Discharge status: Home / Self Care | Payer: Medicare Other | Attending: Internal Medicine | Admitting: Internal Medicine

## 2017-05-07 DIAGNOSIS — R269 Unspecified abnormalities of gait and mobility: Secondary | ICD-10-CM | POA: Diagnosis not present

## 2017-05-07 DIAGNOSIS — G459 Transient cerebral ischemic attack, unspecified: Secondary | ICD-10-CM | POA: Diagnosis not present

## 2017-05-07 DIAGNOSIS — R2689 Other abnormalities of gait and mobility: Secondary | ICD-10-CM | POA: Diagnosis not present

## 2017-05-07 LAB — BASIC METABOLIC PANEL
ANION GAP: 0 — AB (ref 5–15)
BUN: 12 mg/dL (ref 6–20)
CHLORIDE: 110 mmol/L (ref 101–111)
CO2: 34 mmol/L — ABNORMAL HIGH (ref 22–32)
Calcium: 10.8 mg/dL — ABNORMAL HIGH (ref 8.9–10.3)
Creatinine, Ser: 0.92 mg/dL (ref 0.61–1.24)
GFR calc Af Amer: >60 mL/min (ref >60)
Glucose, Bld: 86 mg/dL (ref 65–99)
POTASSIUM: 3.9 mmol/L (ref 3.5–5.1)
SODIUM: 144 mmol/L (ref 135–145)

## 2017-05-07 LAB — LIPID PANEL
CHOL/HDL RATIO: 2.8 ratio
Cholesterol: 120 mg/dL (ref 0–200)
HDL: 43 mg/dL (ref >40)
LDL CALC: 65 mg/dL (ref 0–99)
TRIGLYCERIDES: 61 mg/dL (ref <150)
VLDL: 12 mg/dL (ref 0–40)

## 2017-05-07 LAB — CBC
HEMATOCRIT: 42.5 % (ref 40.0–52.0)
HEMOGLOBIN: 13.7 g/dL (ref 13.0–18.0)
MCH: 28.1 pg (ref 26.0–34.0)
MCHC: 32.2 g/dL (ref 32.0–36.0)
MCV: 87.2 fL (ref 80.0–100.0)
Platelets: 93 10*3/uL — ABNORMAL LOW (ref 150–440)
RBC: 4.87 MIL/uL (ref 4.40–5.90)
RDW: 15.1 % — ABNORMAL HIGH (ref 11.5–14.5)
WBC: 2.5 10*3/uL — AB (ref 3.8–10.6)

## 2017-05-07 LAB — HEMOGLOBIN A1C
Hgb A1c MFr Bld: 5.2 % (ref 4.8–5.6)
MEAN PLASMA GLUCOSE: 103 mg/dL

## 2017-05-07 MED ORDER — DIVALPROEX SODIUM ER 250 MG PO TB24: 1250.0000 mg | ORAL_TABLET | Freq: Every day | ORAL | Status: DC

## 2017-05-07 MED ORDER — DIVALPROEX SODIUM ER 500 MG PO TB24: 1000.0000 mg | ORAL_TABLET | Freq: Every day | ORAL | Status: DC

## 2017-05-07 NOTE — Care Management (Signed)
Physical therapy evaluation completed. Recommending physical therapy 24/7 supervision. [facility] representative states they use [facility]. Floydene Flock, [facility] representative updated. Discharge to home today per Dr. Verdell Carmine. Shelbie Ammons RN MSN CCM Care Management [phone]

## 2017-05-07 NOTE — Discharge Summary (Signed)
[facility] [place] NAME: Henry Smith    MR#:  [id number]  DATE OF BIRTH:  Nov 04, 1941  DATE OF ADMISSION:  05/06/2017 ADMITTING PHYSICIAN: Hillary Bow, MD  DATE OF DISCHARGE: 05/07/2017  PRIMARY CARE PHYSICIAN: Lorelee Market, MD    ADMISSION DIAGNOSIS:  TIA (transient ischemic attack) [G45.9] Right sided weakness [R53.1] Cerebrovascular accident (CVA), unspecified mechanism ([place]) [I63.9]  DISCHARGE DIAGNOSIS:  Active Problems:   Gait abnormality   SECONDARY DIAGNOSIS:   Past Medical History:  Diagnosis Date  . Altered mental status   . Cancer [facility])    Prostate cancer  . Disorientation   . Hypercalcemia   . Leukopenia   . Prostate cancer ([place])   . Schizophrenia ([place])   . Syphilis   . Thrombocytopenia [facility])     HOSPITAL COURSE:   76 year old male with past medical history of prostate cancer, schizophrenia, neuropathy, glaucoma who presented to the hospital due to gait abnormality and left sided numbness.  1. Gait abnormality/numbness-this was thought to be due to CVA on admission given the patient's neurologic symptoms. Patient underwent extensive workup including CT head, MRI of the brain, carotid Doppler which have been negative for any acute pathology. -Patient's symptoms have resolved and he is back to baseline now. Suspected patient's neurologic symptoms related to his high Depakote level. Patient was seen by neurology and they have adjusted his Depakote dose and he is being discharged now on thousand milligrams at bedtime instead of 1250.  2. Schizophrenia-patient will continue Depakote but his dose has been adjusted as per neurology. - Continue Zyprexa  3. Neuropathy-patient will continue Neurontin.  4. Glaucoma-patient will continue latanoprost eyedrops.  Patient is being discharged to his group home with home health services.  DISCHARGE CONDITIONS:   Stable  CONSULTS OBTAINED:  Treatment Team:   Alexis Goodell, MD  DRUG ALLERGIES:   Allergies  Allergen Reactions  . Penicillins     Other reaction(s): Other (See Comments) Blistered up and had to be hospitalized    DISCHARGE MEDICATIONS:   Allergies as of 05/07/2017      Reactions   Penicillins    Other reaction(s): Other (See Comments) Blistered up and had to be hospitalized      Medication List    TAKE these medications   benztropine 1 MG tablet Commonly known as:  COGENTIN Take 1 mg by mouth 2 (two) times daily.   diphenhydrAMINE 25 mg capsule Commonly known as:  BENADRYL Take 1 capsule (25 mg total) by mouth every 8 (eight) hours as needed for itching (rash).   divalproex 500 MG 24 hr tablet Commonly known as:  DEPAKOTE ER Take 1,000 mg by mouth at bedtime. What changed:  Another medication with the same name was removed. Continue taking this medication, and follow the directions you see here.   gabapentin 300 MG capsule Commonly known as:  NEURONTIN Take 300 mg by mouth 3 (three) times daily.   latanoprost 0.005 % ophthalmic solution Commonly known as:  XALATAN Place 1 drop into both eyes at bedtime.   Melatonin 5 MG Tabs Take 5 mg by mouth at bedtime.   OLANZapine 10 MG tablet Commonly known as:  ZYPREXA Take 10 mg by mouth 2 (two) times daily.   triamcinolone ointment 0.1 % Commonly known as:  KENALOG Apply 1 application topically 2 (two) times daily as needed.   Vitamin D3 2000 units capsule Take 4,000 Units by mouth daily.  DISCHARGE INSTRUCTIONS:   DIET:  Regular diet  DISCHARGE CONDITION:  Stable  ACTIVITY:  Activity as tolerated  OXYGEN:  Home Oxygen: No.   Oxygen Delivery: room air  DISCHARGE LOCATION:  group home   If you experience worsening of your admission symptoms, develop shortness of breath, life threatening emergency, suicidal or homicidal thoughts you must seek medical attention immediately by calling 911 or calling your MD immediately  if  symptoms less severe.  You Must read complete instructions/literature along with all the possible adverse reactions/side effects for all the Medicines you take and that have been prescribed to you. Take any new Medicines after you have completely understood and accpet all the possible adverse reactions/side effects.   Please note  You were cared for by a hospitalist during your hospital stay. If you have any questions about your discharge medications or the care you received while you were in the hospital after you are discharged, you can call the unit and asked to speak with the hospitalist on call if the hospitalist that took care of you is not available. Once you are discharged, your primary care physician will handle any further medical issues. Please note that NO REFILLS for any discharge medications will be authorized once you are discharged, as it is imperative that you return to your primary care physician (or establish a relationship with a primary care physician if you do not have one) for your aftercare needs so that they can reassess your need for medications and monitor your lab values.     Today   No headache, numbness, weakness. Awaiting physical therapy evaluation. Neurologic workup so far negative. No other complaints.  VITAL SIGNS:  Blood pressure 138/79, pulse 69, temperature 98.5 F (36.9 C), temperature source Oral, resp. rate 18, height 5\' 10"  (1.778 m), weight 101.1 kg (222 lb 12.8 oz), SpO2 98 %.  I/O:   Intake/Output Summary (Last 24 hours) at 05/07/17 1308 Last data filed at 05/07/17 0953  Gross per 24 hour  Intake              123 ml  Output                0 ml  Net              123 ml    PHYSICAL EXAMINATION:  GENERAL:  76 y.o.-year-old patient lying in the bed with no acute distress.  EYES: Pupils equal, round, reactive to light and accommodation. No scleral icterus. Extraocular muscles intact.  HEENT: Head atraumatic, normocephalic. Oropharynx and  nasopharynx clear.  NECK:  Supple, no jugular venous distention. No thyroid enlargement, no tenderness.  LUNGS: Normal breath sounds bilaterally, no wheezing, rales,rhonchi. No use of accessory muscles of respiration.  CARDIOVASCULAR: S1, S2 normal. No murmurs, rubs, or gallops.  ABDOMEN: Soft, non-tender, non-distended. Bowel sounds present. No organomegaly or mass.  EXTREMITIES: No pedal edema, cyanosis, or clubbing.  NEUROLOGIC: Cranial nerves II through XII are intact. No focal motor or sensory defecits b/l.  PSYCHIATRIC: The patient is alert and oriented x 3.  SKIN: No obvious rash, lesion, or ulcer.   DATA REVIEW:   CBC  Recent Labs Lab 05/07/17 0419  WBC 2.5*  HGB 13.7  HCT 42.5  PLT 93*    Chemistries   Recent Labs Lab 05/03/17 1747  05/07/17 0419  NA 145  < > 144  K 3.7  < > 3.9  CL 110  < > 110  CO2 31  < >  34*  GLUCOSE 90  < > 86  BUN 11  < > 12  CREATININE 0.85  < > 0.92  CALCIUM 11.0*  < > 10.8*  AST 16  --   --   ALT 15*  --   --   ALKPHOS 59  --   --   BILITOT 0.4  --   --   < > = values in this interval not displayed.  Cardiac Enzymes  Recent Labs Lab 05/06/17 1612  [place] <0.03    Microbiology Results  Results for orders placed or performed during the hospital encounter of 05/06/17  MRSA PCR Screening     Status: None   Collection Time: 05/06/17  6:16 PM  Result Value Ref Range Status   MRSA by PCR NEGATIVE NEGATIVE Final    Comment:        The GeneXpert MRSA Assay (FDA approved for NASAL specimens only), is one component of a comprehensive MRSA colonization surveillance program. It is not intended to diagnose MRSA infection nor to guide or monitor treatment for MRSA infections.     RADIOLOGY:  Dg Chest 2 View  Result Date: 05/06/2017 CLINICAL DATA:  Weakness EXAM: CHEST  2 VIEW COMPARISON:  02/28/2017 FINDINGS: Cardiac shadow is mildly enlarged. Right basilar density is noted stable from the prior exam consistent with  scarring. Mild vascular congestion is seen although no interstitial edema is noted. No sizable effusion is seen. IMPRESSION: Mild vascular congestion without interstitial edema. Right basilar scarring. Electronically Signed   By: Inez Catalina M.D.   On: 05/06/2017 15:08   Ct Head Wo Contrast  Result Date: 05/06/2017 CLINICAL DATA:  Weakness. EXAM: CT HEAD WITHOUT CONTRAST TECHNIQUE: Contiguous axial images were obtained from the base of the skull through the vertex without intravenous contrast. COMPARISON:  05/03/2017 FINDINGS: Brain: No evidence of acute infarction, hemorrhage, hydrocephalus, extra-axial collection or mass lesion/mass effect.Mild for age generalized cerebral volume loss. Mild small vessel ischemic change in the periventricular white matter. Vascular: No hyperdense vessel or unexpected calcification. Skull: Multiple subcentimeter sclerotic foci in the calvarium are stable since most remote scan 10/24/2016. Patient has history of prostate cancer. Bone scan was performed 11/18/2016 and negative in the calvarium. Scarring over the forehead.  No acute fracture Sinuses/Orbits: Negative IMPRESSION: No acute finding or change from prior. Electronically Signed   By: Monte Fantasia M.D.   On: 05/06/2017 15:15   Mr Brain Wo Contrast  Result Date: 05/06/2017 CLINICAL DATA:  Gait abnormality EXAM: MRI HEAD WITHOUT CONTRAST TECHNIQUE: Multiplanar, multiecho pulse sequences of the brain and surrounding structures were obtained without intravenous contrast. COMPARISON:  Head CT 05/06/2017 FINDINGS: Despite efforts by the technologist and patient, motion artifact is present on today's examination and could not be eliminated. The findings of this study are interpreted in the context of that limitation. Brain: The midline structures are normal. There is no focal diffusion restriction to indicate acute infarct. There is multifocal hyperintense T2-weighted signal within the periventricular white matter, most  often seen in the setting of chronic microvascular ischemia. No intraparenchymal hematoma or chronic microhemorrhage. Brain volume is normal for age without age-advanced or lobar predominant atrophy. The dura is normal and there is no extra-axial collection. Vascular: Major intracranial arterial and venous sinus flow voids are preserved. Skull and upper cervical spine: The visualized skull base, calvarium, upper cervical spine and extracranial soft tissues are normal. Sinuses/Orbits: No fluid levels or advanced mucosal thickening. No mastoid effusion. Normal orbits. IMPRESSION: 1. Motion degraded examination.  2. Within the above limitation, no acute intracranial abnormality. Electronically Signed   By: Ulyses Jarred M.D.   On: 05/06/2017 17:46   US Carotid Bilateral  Result Date: 05/07/2017 CLINICAL DATA:  TIA.  Right-sided weakness for 2 days. EXAM: BILATERAL CAROTID DUPLEX ULTRASOUND TECHNIQUE: Pearline Cables scale imaging, color Doppler and duplex ultrasound were performed of bilateral carotid and vertebral arteries in the neck. COMPARISON:  None. FINDINGS: Criteria: Quantification of carotid stenosis is based on velocity parameters that correlate the residual internal carotid diameter with NASCET-based stenosis levels, using the diameter of the distal internal carotid lumen as the denominator for stenosis measurement. The following velocity measurements were obtained: RIGHT ICA:  84 cm/sec CCA:  142 cm/sec SYSTOLIC ICA/CCA RATIO:  0.7 DIASTOLIC ICA/CCA RATIO:  0.4 ECA:  73 cm/sec LEFT ICA:  68 cm/sec CCA:  99 cm/sec SYSTOLIC ICA/CCA RATIO:  0.7 DIASTOLIC ICA/CCA RATIO:  1.0 ECA:  57 cm/sec RIGHT CAROTID ARTERY: Little if any plaque in the bulb. Low resistance internal carotid Doppler pattern. RIGHT VERTEBRAL ARTERY:  Antegrade. LEFT CAROTID ARTERY: Little if any plaque in the bulb. Low resistance internal carotid Doppler pattern. LEFT VERTEBRAL ARTERY:  Antegrade. IMPRESSION: Less than 50% stenosis in the right and  left internal carotid arteries. Electronically Signed   By: Marybelle Killings M.D.   On: 05/07/2017 10:42      Management plans discussed with the patient, family and they are in agreement.  CODE STATUS:     Code Status Orders        Start     Ordered   05/06/17 1553  Full code  Continuous     05/06/17 1554    Code Status History    Date Active Date Inactive Code Status Order ID Comments User Context   02/26/2017  4:09 PM 02/27/2017  7:35 PM Full Code [id number]  Gladstone Lighter, MD Inpatient   01/28/2017  4:47 PM 02/04/2017  8:04 PM Full Code [id number]  Zada Finders, MD ED   12/06/2016  3:09 PM 12/08/2016  6:49 PM Full Code [id number]  Hower, Aaron Mose, MD ED   10/24/2016 10:34 PM 10/28/2016  6:07 PM Full Code [id number]  Hugelmeyer, Ubaldo Glassing, DO Inpatient      TOTAL TIME TAKING CARE OF THIS PATIENT: 40 minutes.    Henreitta Leber M.D on 05/07/2017 at 1:08 PM  Between 7am to 6pm - Pager - [phone]  After 6pm go to [email or web address] - Proofreader  [facility]  Office  [phone]  CC: Primary care physician; Lorelee Market, MD

## 2017-05-07 NOTE — Care Management (Signed)
Admitted to this facility with the diagnosis of gait abnormality under observation status. A resident of [facility]. 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  [name] has a legal guardian, Henry Smith that lives in [place]. [phone]). [name] works for [employer] and received guardianship 07/20/16. This paper is part of the electronic record. Received permission to place [name] name on the observation documents after explaining the purpose of observation. Original document will be placed in the patient's room Copy to medical records. Shelbie Ammons RN MSN CCM Care Management [phone]
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
## 2017-05-07 NOTE — Care Management Obs Status (Signed)
[place] NOTIFICATION   Patient Details  Name: Henry Smith MRN: [id number] Date of Birth: 05-Apr-1941   Medicare Observation Status Notification Given:  Yes    Shelbie Ammons, RN 05/07/2017, 9:17 AM

## 2017-05-07 NOTE — Progress Notes (Signed)
Pt has been discharged back to group home. Discharge packet given and explained to pt and caregiver. Caregiver verbalized understanding. Pt escorted in a wheelchair.

## 2017-05-07 NOTE — NC FL2 (Addendum)
[place] LEVEL OF CARE SCREENING TOOL     IDENTIFICATION  Patient Name: Henry Smith Birthdate: December 12, 1941 Sex: male Admission Date (Current Location): 05/06/2017  [place] and [place] Number:  Henry Smith [id number] [place] and Address:  [facility], [address], [place], [place] [address]      Provider Number: [id number]  Attending Physician Name and Address:  Henreitta Leber, MD  Relative Name and Phone Number:  [facility] Legal Guardian   [phone] and Bradshaw, Minihan Brother [phone] or Loyed, Wilmes [phone] or Ray,Mark Other [phone]  [phone]     Current Level of Care: Hospital Recommended Level of Care: [facility]) Prior Approval Number:    Date Approved/Denied:   PASRR Number:    Discharge Plan: Other (Comment) [facility])    Current Diagnoses: Patient Active Problem List   Diagnosis Date Noted  . Gait abnormality 05/06/2017  . Allergic drug rash 03/03/2017  . Altered mental status 02/26/2017  . Syphilis 01/31/2017  . Disorientation   . Generalized weakness 01/30/2017  . Altered mental status, unspecified 01/28/2017  . Hypercalcemia 11/01/2016  . Prostate cancer ([place]) 10/30/2016  . Leukopenia 10/30/2016  . Thrombocytopenia ([place]) 10/30/2016  . Schizophrenia ([place]) 10/26/2016    Orientation RESPIRATION BLADDER Height & Weight     Time, Situation, Place, Self  Normal Continent Weight: 222 lb 12.8 oz (101.1 kg) Height:  5\' 10"  (177.8 cm)  BEHAVIORAL SYMPTOMS/MOOD NEUROLOGICAL BOWEL NUTRITION STATUS      Continent Diet (Cardiac)  AMBULATORY STATUS COMMUNICATION OF NEEDS Skin   Supervision Verbally Normal                       Personal Care Assistance Level of Assistance  Bathing, Feeding, Dressing Bathing Assistance: Limited assistance Feeding assistance: Independent Dressing Assistance: Independent     Functional  Limitations Info  Sight, Hearing, Speech Sight Info: Adequate Hearing Info: Adequate Speech Info: Adequate    SPECIAL CARE FACTORS FREQUENCY                       Contractures Contractures Info: Not present    Additional Factors Info  Psychotropic Code Status Info: Full Code Allergies Info: PENICILLINS  Psychotropic Info: divalproex (DEPAKOTE ER) 24 hr tablet 1,000 mg and OLANZapine (ZYPREXA) tablet 10 mg         Discharge Medications: Please see discharge summary for a list of discharge medications. Medication List    TAKE these medications   benztropine 1 MG tablet Commonly known as:  COGENTIN Take 1 mg by mouth 2 (two) times daily.   diphenhydrAMINE 25 mg capsule Commonly known as:  BENADRYL Take 1 capsule (25 mg total) by mouth every 8 (eight) hours as needed for itching (rash).   divalproex 500 MG 24 hr tablet Commonly known as:  DEPAKOTE ER Take 1,000 mg by mouth at bedtime. What changed:  Another medication with the same name was removed. Continue taking this medication, and follow the directions you see here.   gabapentin 300 MG capsule Commonly known as:  NEURONTIN Take 300 mg by mouth 3 (three) times daily.   latanoprost 0.005 % ophthalmic solution Commonly known as:  XALATAN Place 1 drop into both eyes at bedtime.   Melatonin 5 MG Tabs Take 5 mg by mouth at bedtime.   OLANZapine 10 MG tablet Commonly known as:  ZYPREXA Take 10 mg by mouth 2 (two) times daily.   triamcinolone ointment  0.1 % Commonly known as:  KENALOG Apply 1 application topically 2 (two) times daily as needed.   Vitamin D3 2000 units capsule Take 4,000 Units by mouth daily.    Relevant Imaging Results:  Relevant Lab Results:   Additional Information SSN [id number]   [place] for PT and Social Worker to follow at facility  [place], [place], [place]
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
## 2017-05-07 NOTE — Clinical Social Work Note (Signed)
Clinical Social Work Assessment  Patient Details  Name: Henry Smith MRN: [id number] Date of Birth: 11-13-1941  Date of referral:  05/07/17               Reason for consult:  Facility Placement                Permission sought to share information with:  Facility Henry Smith Permission granted to share information::  Yes, Verbal Permission Granted  Name::     Henry Smith   [phone] and Henry Smith Brother [phone] or Henry Smith [phone] or Henry Smith Other [phone]  [phone]   Agency::  [place]   Relationship::     Contact Information:     Housing/Transportation Living arrangements for the past 2 months:  [place] of Information:  Patient Patient Interpreter Needed:  None Criminal Activity/Legal Involvement Pertinent to Current Situation/Hospitalization:  No - Comment as needed Significant Relationships:  Friend, Other Family Members Lives with:  Other (Comment) [facility]) Do you feel safe going back to the place where you live?  Yes Need for family participation in patient care:  No (Coment)  Care giving concerns: Patient did not express any concerns about returning to group home.                Social Worker assessment / plan:  Patient is a 76 year old male who is from [facility].  Patient is alert and oriented x4, patient states he has been at group home for awhile, and he plans on returning back to group home.  Patient states he has about 8 or 9 other roommates and he is satisfied with the current arrangment.  Patient did not express any other concerns or questions.   Employment status:  Disabled (Comment on whether or not currently receiving Disability) Insurance information:  Medicaid In [place], [place] PT Recommendations:  Not assessed at this time Information / Referral to community resources:     Patient/Family's Response to care:  Patient is in  agreement to returning back to group home.  Patient/Family's Understanding of and Emotional Response to Diagnosis, Current Treatment, and Prognosis:  Patient is aware of current treatment plan and prognosis.  Emotional Assessment Appearance:  Appears stated age Attitude/Demeanor/Rapport:    Affect (typically observed):  Calm, Appropriate Orientation:  Oriented to Self, Oriented to Place, Oriented to  Time, Oriented to Situation Alcohol / Substance use:  Not Applicable Psych involvement (Current and /or in the community):  No (Comment)  Discharge Needs  Concerns to be addressed:  No discharge needs identified Readmission within the last 30 days: Yes 05-03-17 to [facility]. Current discharge risk:  None Barriers to Discharge:  Continued Medical Work up   Henry Smith 05/07/2017, 12:25 PM

## 2017-05-07 NOTE — Clinical Social Work Note (Signed)
Pt is ready for discharge today and will return to [facility]. Facility is able to accept pt and will arrange transportation. Pt's guardian is aware and agreeable to discharge plan. CSW prepared packet. CSW is signing off.   Darden Dates, MSW, LCSW  Clinical Social Worker  [phone]

## 2017-05-07 NOTE — Progress Notes (Signed)
*  PRELIMINARY RESULTS* Echocardiogram 2D Echocardiogram has been performed.  Sherrie Sport 05/07/2017, 2:09 PM

## 2017-05-08 LAB — ECHOCARDIOGRAM COMPLETE
HEIGHTINCHES: 70 in
Weight: 3564.8 oz

## 2017-05-22 ENCOUNTER — Encounter: Payer: Self-pay | Admitting: Emergency Medicine

## 2017-05-22 ENCOUNTER — Emergency Department: Payer: Medicare Other

## 2017-05-22 ENCOUNTER — Emergency Department
Admission: EM | Admit: 2017-05-22 | Discharge: 2017-05-22 | Disposition: A | Discharge status: Home / Self Care | Payer: Medicare Other | Attending: Emergency Medicine | Admitting: Emergency Medicine

## 2017-05-22 DIAGNOSIS — Z8546 Personal history of malignant neoplasm of prostate: Secondary | ICD-10-CM | POA: Diagnosis not present

## 2017-05-22 DIAGNOSIS — F17210 Nicotine dependence, cigarettes, uncomplicated: Secondary | ICD-10-CM | POA: Insufficient documentation

## 2017-05-22 DIAGNOSIS — Z79899 Other long term (current) drug therapy: Secondary | ICD-10-CM | POA: Insufficient documentation

## 2017-05-22 DIAGNOSIS — S0083XA Contusion of other part of head, initial encounter: Secondary | ICD-10-CM | POA: Diagnosis not present

## 2017-05-22 DIAGNOSIS — W19XXXA Unspecified fall, initial encounter: Secondary | ICD-10-CM | POA: Diagnosis not present

## 2017-05-22 DIAGNOSIS — R1030 Lower abdominal pain, unspecified: Secondary | ICD-10-CM | POA: Insufficient documentation

## 2017-05-22 DIAGNOSIS — Y939 Activity, unspecified: Secondary | ICD-10-CM | POA: Diagnosis not present

## 2017-05-22 DIAGNOSIS — S0081XA Abrasion of other part of head, initial encounter: Secondary | ICD-10-CM | POA: Diagnosis not present

## 2017-05-22 DIAGNOSIS — R109 Unspecified abdominal pain: Secondary | ICD-10-CM

## 2017-05-22 DIAGNOSIS — Y929 Unspecified place or not applicable: Secondary | ICD-10-CM | POA: Diagnosis not present

## 2017-05-22 DIAGNOSIS — Y998 Other external cause status: Secondary | ICD-10-CM | POA: Diagnosis not present

## 2017-05-22 DIAGNOSIS — S0990XA Unspecified injury of head, initial encounter: Secondary | ICD-10-CM | POA: Diagnosis present

## 2017-05-22 HISTORY — DX: General paresis: A52.17

## 2017-05-22 HISTORY — DX: Dementia in other diseases classified elsewhere, unspecified severity, without behavioral disturbance, psychotic disturbance, mood disturbance, and anxiety: F02.80

## 2017-05-22 LAB — URINALYSIS, COMPLETE (UACMP) WITH MICROSCOPIC
BACTERIA UA: NONE SEEN
Bilirubin Urine: NEGATIVE
Glucose, UA: NEGATIVE mg/dL
HGB URINE DIPSTICK: NEGATIVE
Ketones, ur: 5 mg/dL — AB
Leukocytes, UA: NEGATIVE
NITRITE: NEGATIVE
PROTEIN: NEGATIVE mg/dL
SPECIFIC GRAVITY, URINE: 1.021 (ref 1.005–1.030)
pH: 6 (ref 5.0–8.0)

## 2017-05-22 LAB — CBC
HEMATOCRIT: 43.9 % (ref 40.0–52.0)
Hemoglobin: 14.1 g/dL (ref 13.0–18.0)
MCH: 28.2 pg (ref 26.0–34.0)
MCHC: 32.2 g/dL (ref 32.0–36.0)
MCV: 87.3 fL (ref 80.0–100.0)
Platelets: 121 10*3/uL — ABNORMAL LOW (ref 150–440)
RBC: 5.02 MIL/uL (ref 4.40–5.90)
RDW: 15.2 % — AB (ref 11.5–14.5)
WBC: 3.9 10*3/uL (ref 3.8–10.6)

## 2017-05-22 LAB — COMPREHENSIVE METABOLIC PANEL
ALT: 11 U/L — ABNORMAL LOW (ref 17–63)
ANION GAP: 5 (ref 5–15)
AST: 17 U/L (ref 15–41)
Albumin: 3.7 g/dL (ref 3.5–5.0)
Alkaline Phosphatase: 42 U/L (ref 38–126)
BILIRUBIN TOTAL: 0.5 mg/dL (ref 0.3–1.2)
BUN: 12 mg/dL (ref 6–20)
CO2: 29 mmol/L (ref 22–32)
Calcium: 11.3 mg/dL — ABNORMAL HIGH (ref 8.9–10.3)
Chloride: 110 mmol/L (ref 101–111)
Creatinine, Ser: 0.86 mg/dL (ref 0.61–1.24)
GFR calc Af Amer: >60 mL/min (ref >60)
Glucose, Bld: 91 mg/dL (ref 65–99)
POTASSIUM: 3.9 mmol/L (ref 3.5–5.1)
Sodium: 144 mmol/L (ref 135–145)
TOTAL PROTEIN: 6.7 g/dL (ref 6.5–8.1)

## 2017-05-22 LAB — TROPONIN I: Troponin I: <0.03 ng/mL (ref <0.03)

## 2017-05-22 LAB — LIPASE, BLOOD: Lipase: 22 U/L (ref 11–51)

## 2017-05-22 MED ORDER — IOPAMIDOL (ISOVUE-300) INJECTION 61%
100.0000 mL | Freq: Once | INTRAVENOUS | Status: AC | PRN
  Administered 2017-05-22: 100 mL via INTRAVENOUS

## 2017-05-22 MED ORDER — TETANUS-DIPHTH-ACELL PERTUSSIS 5-2.5-18.5 LF-MCG/0.5 IM SUSP
0.5000 mL | Freq: Once | INTRAMUSCULAR | Status: AC
  Administered 2017-05-22: 0.5 mL via INTRAMUSCULAR
  Filled 2017-05-22: qty 0.5

## 2017-05-22 MED ORDER — IOPAMIDOL (ISOVUE-300) INJECTION 61%
30.0000 mL | Freq: Once | INTRAVENOUS | Status: AC | PRN
  Administered 2017-05-22: 30 mL via ORAL
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
## 2017-05-22 NOTE — ED Notes (Signed)
Attempted to call care home, no answer.

## 2017-05-22 NOTE — ED Notes (Signed)
See triage note  Brought in by [facility]  He is having pain to right arm    Also abd is tender to touch  Pt is not a good historian    No deformity noted to arm  No redness

## 2017-05-22 NOTE — ED Notes (Signed)
Pt transported to CT ?

## 2017-05-22 NOTE — Discharge Instructions (Signed)
You were evaluated after a fall, and no serious injuries suspected. You treated for abrasion of the face, bruising/contusion of the face as well as suspected contusion of the abdomen or musculoskeletal pain.  CT scan of the head as well as CT scan of the abdomen were performed and no traumatic injuries were found. There is evidence of chronic rib fractures on both sides, which do not appear related to this weeks fall.  Return to emergency department immediately for any worsening symptoms including signs of infection from the abrasion, confusion altered mental status, new or worsening abdominal pain, black or bloody stool, vomiting, or any other symptoms concerning to you.  CT scan of the head showed possible findings of prostate cancer metastasis to the skull, please follow this finding up with their primary care doctor.

## 2017-05-22 NOTE — ED Notes (Signed)
Pt given sandwich 

## 2017-07-11 ENCOUNTER — Encounter: Payer: Self-pay | Admitting: Hematology and Oncology

## 2017-09-07 ENCOUNTER — Emergency Department
Admission: EM | Admit: 2017-09-07 | Discharge: 2017-09-07 | Disposition: A | Discharge status: Home / Self Care | Payer: Medicare Other | Attending: Emergency Medicine | Admitting: Emergency Medicine

## 2017-09-07 DIAGNOSIS — F17220 Nicotine dependence, chewing tobacco, uncomplicated: Secondary | ICD-10-CM | POA: Diagnosis not present

## 2017-09-07 DIAGNOSIS — R531 Weakness: Secondary | ICD-10-CM | POA: Diagnosis not present

## 2017-09-07 DIAGNOSIS — F17210 Nicotine dependence, cigarettes, uncomplicated: Secondary | ICD-10-CM | POA: Diagnosis not present

## 2017-09-07 DIAGNOSIS — F0390 Unspecified dementia without behavioral disturbance: Secondary | ICD-10-CM | POA: Diagnosis not present

## 2017-09-07 DIAGNOSIS — Z79899 Other long term (current) drug therapy: Secondary | ICD-10-CM | POA: Insufficient documentation

## 2017-09-07 LAB — URINALYSIS, ROUTINE W REFLEX MICROSCOPIC
Bilirubin Urine: NEGATIVE
Glucose, UA: NEGATIVE mg/dL
Hgb urine dipstick: NEGATIVE
KETONES UR: 5 mg/dL — AB
LEUKOCYTES UA: NEGATIVE
NITRITE: NEGATIVE
PH: 6 (ref 5.0–8.0)
Protein, ur: NEGATIVE mg/dL
Specific Gravity, Urine: 1.02 (ref 1.005–1.030)

## 2017-09-07 LAB — COMPREHENSIVE METABOLIC PANEL
ALBUMIN: 3.3 g/dL — AB (ref 3.5–5.0)
ALT: 17 U/L (ref 17–63)
ANION GAP: 5 (ref 5–15)
AST: 19 U/L (ref 15–41)
Alkaline Phosphatase: 57 U/L (ref 38–126)
BILIRUBIN TOTAL: 0.5 mg/dL (ref 0.3–1.2)
BUN: 9 mg/dL (ref 6–20)
CO2: 29 mmol/L (ref 22–32)
Calcium: 11.2 mg/dL — ABNORMAL HIGH (ref 8.9–10.3)
Chloride: 109 mmol/L (ref 101–111)
Creatinine, Ser: 0.96 mg/dL (ref 0.61–1.24)
GFR calc Af Amer: >60 mL/min (ref >60)
GFR calc non Af Amer: >60 mL/min (ref >60)
GLUCOSE: 94 mg/dL (ref 65–99)
POTASSIUM: 4.2 mmol/L (ref 3.5–5.1)
SODIUM: 143 mmol/L (ref 135–145)
TOTAL PROTEIN: 6.3 g/dL — AB (ref 6.5–8.1)

## 2017-09-07 LAB — CBC
HCT: 43 % (ref 40.0–52.0)
Hemoglobin: 13.9 g/dL (ref 13.0–18.0)
MCH: 28 pg (ref 26.0–34.0)
MCHC: 32.3 g/dL (ref 32.0–36.0)
MCV: 86.6 fL (ref 80.0–100.0)
Platelets: 88 10*3/uL — ABNORMAL LOW (ref 150–440)
RBC: 4.97 MIL/uL (ref 4.40–5.90)
RDW: 15.2 % — AB (ref 11.5–14.5)
WBC: 3.1 10*3/uL — ABNORMAL LOW (ref 3.8–10.6)

## 2017-09-07 LAB — TROPONIN I: Troponin I: <0.03 ng/mL (ref <0.03)

## 2017-09-07 NOTE — ED Triage Notes (Addendum)
Pt to ED via [facility] from assisted living facility. Per EMS staff reports pt was ambulating independently and "stiffened up" but did not fall. EMS reports staff wants to make sure he does not have a UTI. Pt A&Ox4, denies pain and denies NVD, grips equal bilaterally; no acute distress noted.

## 2017-09-07 NOTE — ED Notes (Signed)
Pt ambulated from room to nursing station and back, without any difficulty or distress noted.
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
## 2017-09-07 NOTE — ED Notes (Signed)
Pt hypertensive, MD made aware. 

## 2017-09-08 ENCOUNTER — Encounter: Payer: Self-pay | Admitting: Emergency Medicine

## 2017-09-08 ENCOUNTER — Emergency Department: Payer: Medicare Other

## 2017-09-08 ENCOUNTER — Emergency Department
Admission: EM | Admit: 2017-09-08 | Discharge: 2017-09-08 | Disposition: A | Discharge status: Home / Self Care | Payer: Medicare Other | Attending: Emergency Medicine | Admitting: Emergency Medicine

## 2017-09-08 DIAGNOSIS — Z79899 Other long term (current) drug therapy: Secondary | ICD-10-CM | POA: Insufficient documentation

## 2017-09-08 DIAGNOSIS — R799 Abnormal finding of blood chemistry, unspecified: Secondary | ICD-10-CM | POA: Diagnosis present

## 2017-09-08 DIAGNOSIS — F0390 Unspecified dementia without behavioral disturbance: Secondary | ICD-10-CM | POA: Diagnosis not present

## 2017-09-08 DIAGNOSIS — Z8546 Personal history of malignant neoplasm of prostate: Secondary | ICD-10-CM | POA: Diagnosis not present

## 2017-09-08 DIAGNOSIS — F17210 Nicotine dependence, cigarettes, uncomplicated: Secondary | ICD-10-CM | POA: Diagnosis not present

## 2017-09-08 DIAGNOSIS — A523 Neurosyphilis, unspecified: Secondary | ICD-10-CM | POA: Diagnosis not present

## 2017-09-08 DIAGNOSIS — M19011 Primary osteoarthritis, right shoulder: Secondary | ICD-10-CM | POA: Insufficient documentation

## 2017-09-08 LAB — VALPROIC ACID LEVEL: Valproic Acid Lvl: 65 ug/mL (ref 50.0–100.0)

## 2017-09-08 MED ORDER — ONDANSETRON HCL 4 MG/2ML IJ SOLN: 4.0000 mg | Freq: Once | INTRAMUSCULAR | Status: DC

## 2017-09-08 MED ORDER — MORPHINE SULFATE (PF) 2 MG/ML IV SOLN: 2.0000 mg | Freq: Once | INTRAVENOUS | Status: DC

## 2017-09-08 NOTE — Discharge Instructions (Signed)
please return for any further problems.

## 2017-09-08 NOTE — ED Triage Notes (Signed)
Pt in via [facility] from [facility]; per EMS, pt advised per PCP to be evaluated here to check Depakote level, no further information provided.  Pt is very poor historian, pt is unable to tell me why he is here, pt unable to provide guardianship status.  Pt denies any complaints at this time.  Vitals WDL, NAD noted at this time.

## 2017-09-08 NOTE — ED Notes (Signed)
Called patient's legal guardian and left message that we would be discharging patient back to [facility]

## 2017-09-08 NOTE — ED Notes (Signed)
Nurse spoke with Patient's legal guardian and informed legal guardian that patient was being d/c back to [facility]
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
## 2017-09-08 NOTE — ED Notes (Addendum)
Nurse was asked to ambulate patient and see if they were able to walk ok. Nurse asked tech to ambulate patient and patient was ambulate fine and nurse tried to call report to [facility] and unable to get anyone to answer

## 2017-09-08 NOTE — ED Notes (Signed)
Pt walked the hall with no assistance Pt was also given a meal tray and ginger ale Lm edt

## 2017-09-08 NOTE — ED Notes (Signed)
EMS left with patient ambulatory for transport to facility

## 2017-09-08 NOTE — ED Notes (Signed)
This RN has called both numbers provided for [facility] multiple times in attempt to receive further information, no response to either number and unable to leave message due to mail box being full.

## 2017-09-08 NOTE — ED Notes (Signed)
Charge nurse called officer to go out to [facility] and have them call the ER so that we could give report since patient has a legal guardian

## 2017-10-25 ENCOUNTER — Encounter: Payer: Self-pay | Admitting: Emergency Medicine

## 2017-10-25 ENCOUNTER — Emergency Department: Payer: Medicare Other

## 2017-10-25 ENCOUNTER — Emergency Department
Admission: EM | Admit: 2017-10-25 | Discharge: 2017-10-25 | Disposition: A | Discharge status: Home / Self Care | Payer: Medicare Other | Attending: Emergency Medicine | Admitting: Emergency Medicine

## 2017-10-25 DIAGNOSIS — F17210 Nicotine dependence, cigarettes, uncomplicated: Secondary | ICD-10-CM | POA: Insufficient documentation

## 2017-10-25 DIAGNOSIS — R5383 Other fatigue: Secondary | ICD-10-CM | POA: Diagnosis not present

## 2017-10-25 DIAGNOSIS — Z79899 Other long term (current) drug therapy: Secondary | ICD-10-CM | POA: Diagnosis not present

## 2017-10-25 DIAGNOSIS — F0390 Unspecified dementia without behavioral disturbance: Secondary | ICD-10-CM | POA: Insufficient documentation

## 2017-10-25 DIAGNOSIS — R4182 Altered mental status, unspecified: Secondary | ICD-10-CM | POA: Diagnosis present

## 2017-10-25 DIAGNOSIS — Z8546 Personal history of malignant neoplasm of prostate: Secondary | ICD-10-CM | POA: Insufficient documentation

## 2017-10-25 LAB — COMPREHENSIVE METABOLIC PANEL
ALT: 27 U/L (ref 17–63)
ANION GAP: 9 (ref 5–15)
AST: 30 U/L (ref 15–41)
Albumin: 3.4 g/dL — ABNORMAL LOW (ref 3.5–5.0)
Alkaline Phosphatase: 53 U/L (ref 38–126)
BUN: 14 mg/dL (ref 6–20)
CALCIUM: 11.8 mg/dL — AB (ref 8.9–10.3)
CHLORIDE: 109 mmol/L (ref 101–111)
CO2: 25 mmol/L (ref 22–32)
Creatinine, Ser: 0.86 mg/dL (ref 0.61–1.24)
GFR calc non Af Amer: >60 mL/min (ref >60)
Glucose, Bld: 78 mg/dL (ref 65–99)
POTASSIUM: 4.3 mmol/L (ref 3.5–5.1)
SODIUM: 143 mmol/L (ref 135–145)
Total Bilirubin: 0.6 mg/dL (ref 0.3–1.2)
Total Protein: 6.5 g/dL (ref 6.5–8.1)

## 2017-10-25 LAB — URINALYSIS, COMPLETE (UACMP) WITH MICROSCOPIC
BACTERIA UA: NONE SEEN
Bilirubin Urine: NEGATIVE
Glucose, UA: NEGATIVE mg/dL
KETONES UR: 5 mg/dL — AB
Nitrite: NEGATIVE
PROTEIN: NEGATIVE mg/dL
Specific Gravity, Urine: 1.019 (ref 1.005–1.030)
pH: 5 (ref 5.0–8.0)

## 2017-10-25 LAB — CBC
HCT: 44 % (ref 40.0–52.0)
HEMOGLOBIN: 14 g/dL (ref 13.0–18.0)
MCH: 27.9 pg (ref 26.0–34.0)
MCHC: 31.9 g/dL — ABNORMAL LOW (ref 32.0–36.0)
MCV: 87.4 fL (ref 80.0–100.0)
Platelets: 89 10*3/uL — ABNORMAL LOW (ref 150–440)
RBC: 5.03 MIL/uL (ref 4.40–5.90)
RDW: 14.9 % — ABNORMAL HIGH (ref 11.5–14.5)
WBC: 3.3 10*3/uL — ABNORMAL LOW (ref 3.8–10.6)

## 2017-10-25 NOTE — ED Notes (Signed)
Hooked patient up to the monitor
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
## 2017-10-25 NOTE — ED Triage Notes (Signed)
Patient presents to the ED via EMS from [facility]. Staff states that yesterday patient was complaining of lower abdominal pain and side pain and then today at breakfast patient seemed more lethargic than normal, was having difficulty speaking and difficulty walking.  Patient appears tired during triage, will open his eyes to verbal stimulation.  Patient is not speaking more than one word and speech is somewhat garbled.  Patient is oriented to self but does not seem to be oriented to place or time.

## 2017-10-25 NOTE — ED Notes (Signed)
Pt has a guardian, no one was able to contact this person to discuss d/c instructions. Report called to care facility. Care facility also attempted to call guardian and left a message, w/o results. Pt is unable to sign for himself. Care facility is unable to transport at this time.

## 2017-10-25 NOTE — Discharge Instructions (Signed)
Please seek medical attention for any high fevers, chest pain, shortness of breath, change in behavior, persistent vomiting, bloody stool or any other new or concerning symptoms.  

## 2017-10-27 ENCOUNTER — Emergency Department: Payer: Medicare Other

## 2017-10-27 ENCOUNTER — Encounter: Payer: Self-pay | Admitting: Intensive Care

## 2017-10-27 ENCOUNTER — Emergency Department
Admission: EM | Admit: 2017-10-27 | Discharge: 2017-10-30 | Disposition: A | Discharge status: Home / Self Care | Payer: Medicare Other | Attending: Emergency Medicine | Admitting: Emergency Medicine

## 2017-10-27 DIAGNOSIS — R109 Unspecified abdominal pain: Secondary | ICD-10-CM | POA: Insufficient documentation

## 2017-10-27 DIAGNOSIS — R531 Weakness: Secondary | ICD-10-CM | POA: Diagnosis not present

## 2017-10-27 DIAGNOSIS — F0390 Unspecified dementia without behavioral disturbance: Secondary | ICD-10-CM | POA: Insufficient documentation

## 2017-10-27 DIAGNOSIS — M6281 Muscle weakness (generalized): Secondary | ICD-10-CM | POA: Insufficient documentation

## 2017-10-27 DIAGNOSIS — R2681 Unsteadiness on feet: Secondary | ICD-10-CM | POA: Insufficient documentation

## 2017-10-27 DIAGNOSIS — F17220 Nicotine dependence, chewing tobacco, uncomplicated: Secondary | ICD-10-CM | POA: Insufficient documentation

## 2017-10-27 DIAGNOSIS — R262 Difficulty in walking, not elsewhere classified: Secondary | ICD-10-CM | POA: Diagnosis not present

## 2017-10-27 DIAGNOSIS — F17210 Nicotine dependence, cigarettes, uncomplicated: Secondary | ICD-10-CM | POA: Insufficient documentation

## 2017-10-27 LAB — COMPREHENSIVE METABOLIC PANEL
ALT: 29 U/L (ref 17–63)
ANION GAP: 8 (ref 5–15)
AST: 30 U/L (ref 15–41)
Albumin: 3.5 g/dL (ref 3.5–5.0)
Alkaline Phosphatase: 58 U/L (ref 38–126)
BUN: 18 mg/dL (ref 6–20)
CHLORIDE: 107 mmol/L (ref 101–111)
CO2: 26 mmol/L (ref 22–32)
CREATININE: 1.14 mg/dL (ref 0.61–1.24)
Calcium: 12 mg/dL — ABNORMAL HIGH (ref 8.9–10.3)
Glucose, Bld: 102 mg/dL — ABNORMAL HIGH (ref 65–99)
POTASSIUM: 4 mmol/L (ref 3.5–5.1)
SODIUM: 141 mmol/L (ref 135–145)
Total Bilirubin: 0.9 mg/dL (ref 0.3–1.2)
Total Protein: 7.2 g/dL (ref 6.5–8.1)

## 2017-10-27 LAB — URINALYSIS, COMPLETE (UACMP) WITH MICROSCOPIC
Bilirubin Urine: NEGATIVE
GLUCOSE, UA: NEGATIVE mg/dL
KETONES UR: 20 mg/dL — AB
NITRITE: NEGATIVE
PH: 7 (ref 5.0–8.0)
PROTEIN: 100 mg/dL — AB
Specific Gravity, Urine: 1.021 (ref 1.005–1.030)

## 2017-10-27 LAB — VALPROIC ACID LEVEL: Valproic Acid Lvl: 47 ug/mL — ABNORMAL LOW (ref 50.0–100.0)

## 2017-10-27 LAB — CBC WITH DIFFERENTIAL/PLATELET
Basophils Absolute: 0 10*3/uL (ref 0–0.1)
Basophils Relative: 0 %
EOS ABS: 0 10*3/uL (ref 0–0.7)
EOS PCT: 0 %
HCT: 44.9 % (ref 40.0–52.0)
Hemoglobin: 14.3 g/dL (ref 13.0–18.0)
LYMPHS ABS: 0.5 10*3/uL — AB (ref 1.0–3.6)
LYMPHS PCT: 7 %
MCH: 27.9 pg (ref 26.0–34.0)
MCHC: 31.9 g/dL — AB (ref 32.0–36.0)
MCV: 87.7 fL (ref 80.0–100.0)
MONO ABS: 0.5 10*3/uL (ref 0.2–1.0)
Monocytes Relative: 7 %
Neutro Abs: 5.9 10*3/uL (ref 1.4–6.5)
Neutrophils Relative %: 86 %
PLATELETS: 104 10*3/uL — AB (ref 150–440)
RBC: 5.12 MIL/uL (ref 4.40–5.90)
RDW: 14.7 % — AB (ref 11.5–14.5)
WBC: 6.9 10*3/uL (ref 3.8–10.6)

## 2017-10-27 LAB — TROPONIN I

## 2017-10-27 LAB — CK: Total CK: 75 U/L (ref 49–397)

## 2017-10-27 MED ORDER — SODIUM CHLORIDE 0.9 % IV SOLN
Freq: Once | INTRAVENOUS | Status: AC
  Administered 2017-10-27: 09:00:00 via INTRAVENOUS

## 2017-10-27 MED ORDER — GABAPENTIN 300 MG PO CAPS
300.0000 mg | ORAL_CAPSULE | Freq: Three times a day (TID) | ORAL | Status: DC
  Administered 2017-10-27 – 2017-10-30 (×6): 300 mg via ORAL
  Filled 2017-10-27 (×11): qty 1

## 2017-10-27 MED ORDER — FOSFOMYCIN TROMETHAMINE 3 G PO PACK
3.0000 g | PACK | Freq: Once | ORAL | Status: AC
  Administered 2017-10-28: 3 g via ORAL
  Filled 2017-10-27: qty 3

## 2017-10-27 MED ORDER — LORAZEPAM 2 MG/ML IJ SOLN
INTRAMUSCULAR | Status: AC
  Administered 2017-10-27: 1 mg via INTRAMUSCULAR
  Filled 2017-10-27: qty 1

## 2017-10-27 MED ORDER — VITAMIN D3 25 MCG (1000 UNIT) PO TABS
2000.0000 [IU] | ORAL_TABLET | Freq: Every day | ORAL | Status: DC
  Administered 2017-10-28 – 2017-10-30 (×3): 2000 [IU] via ORAL
  Filled 2017-10-27 (×8): qty 2

## 2017-10-27 MED ORDER — OLANZAPINE 10 MG PO TABS
10.0000 mg | ORAL_TABLET | Freq: Two times a day (BID) | ORAL | Status: DC
  Administered 2017-10-27 – 2017-10-30 (×6): 10 mg via ORAL
  Filled 2017-10-27 (×7): qty 1

## 2017-10-27 MED ORDER — DIVALPROEX SODIUM ER 250 MG PO TB24
1000.0000 mg | ORAL_TABLET | Freq: Every day | ORAL | Status: DC
  Administered 2017-10-27 – 2017-10-29 (×3): 1000 mg via ORAL
  Filled 2017-10-27 (×3): qty 4

## 2017-10-27 MED ORDER — DIPHENHYDRAMINE HCL 25 MG PO CAPS
25.0000 mg | ORAL_CAPSULE | Freq: Three times a day (TID) | ORAL | Status: DC | PRN
  Administered 2017-10-29: 25 mg via ORAL
  Filled 2017-10-27 (×3): qty 1

## 2017-10-27 MED ORDER — LOSARTAN POTASSIUM 50 MG PO TABS
25.0000 mg | ORAL_TABLET | Freq: Every day | ORAL | Status: DC
  Administered 2017-10-28: 25 mg via ORAL
  Administered 2017-10-29: 11:00:00 via ORAL
  Administered 2017-10-30: 25 mg via ORAL
  Filled 2017-10-27 (×4): qty 1

## 2017-10-27 MED ORDER — LORAZEPAM 2 MG/ML IJ SOLN
1.0000 mg | Freq: Once | INTRAMUSCULAR | Status: AC
  Administered 2017-10-27: 1 mg via INTRAMUSCULAR

## 2017-10-27 MED ORDER — AMANTADINE HCL 100 MG PO CAPS
100.0000 mg | ORAL_CAPSULE | Freq: Two times a day (BID) | ORAL | Status: DC
  Administered 2017-10-27 – 2017-10-30 (×6): 100 mg via ORAL
  Filled 2017-10-27 (×9): qty 1

## 2017-10-27 MED ORDER — LORAZEPAM 2 MG/ML IJ SOLN: 1.0000 mg | Freq: Once | INTRAMUSCULAR | Status: DC

## 2017-10-27 MED ORDER — LATANOPROST 0.005 % OP SOLN
1.0000 [drp] | Freq: Every day | OPHTHALMIC | Status: DC
  Administered 2017-10-28 – 2017-10-29 (×2): 1 [drp] via OPHTHALMIC
  Filled 2017-10-27: qty 2.5

## 2017-10-27 MED ORDER — MELATONIN 5 MG PO TABS
5.0000 mg | ORAL_TABLET | Freq: Every day | ORAL | Status: DC
  Administered 2017-10-27 – 2017-10-29 (×2): 5 mg via ORAL
  Filled 2017-10-27 (×3): qty 1

## 2017-10-27 MED ORDER — DIVALPROEX SODIUM 250 MG PO DR TAB
250.0000 mg | DELAYED_RELEASE_TABLET | Freq: Every day | ORAL | Status: DC
  Administered 2017-10-27 – 2017-10-29 (×3): 250 mg via ORAL
  Filled 2017-10-27 (×3): qty 1

## 2017-10-27 NOTE — ED Notes (Signed)
Report to Rebecca, RN.
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
## 2017-10-27 NOTE — ED Notes (Signed)
Pt unable to keep neck in position to allow IV fluids to infuse. Pt drinking oral fluids without problems. Pt has drank 4 X 12 oz glasses of water this AM. EDP aware.

## 2017-10-27 NOTE — ED Notes (Signed)
Pt given 12 oz cup of water; drinking at this time.

## 2017-10-27 NOTE — ED Notes (Signed)
Provided hospital bed for comfort. Pt dry. Warm blankets on patient.

## 2017-10-27 NOTE — Clinical Social Work Note (Signed)
CSW received consult for "Needs higher level of care." Pt from [place] (ALF). Pt presents to ED for a fall.  CSW spoke with legal guardian [name] [phone] who states at baseline pt was ambulating with a walker, without supervision. Mr. Joanell Rising agreeable to MD recommendations.  CSW met with pt at bedside. CSW introduced self and explained Social Work role. Pt oriented to self only and demonstrated confusion when asked questions. Pt also has dementia and schizophrenia. Pt calm and cooperative. Per RN, pt has not demonstrated any aggressive behavior. Dr. Jimmye Norman recommending a higher level of care. P/T recommends SNF placement. CSW completed FL-2 and faxed out to [place] and surrounding counties for a Medicaid bed, as Medicare will not pay for short term rehab without 3  midnight qualifying inpatient stay. CSW completed PASRR. Pt is a level II PASRR and is being reviewed. CSW already faxed H&P, FL-2, and 30 day note to [phone]. CSW awaiting PASRR# assignment.   CSW spoke with Joaquim Nam of [place] at [place] [phone]), who stated she would assess pt onsite tomorrow (Thursday). CSW to follow up on 3 other offers. CSW continuing to follow for pt support and disposition planning.   Oretha Ellis, Latanya Presser, [place] Social Worker-ED [phone]

## 2017-10-27 NOTE — ED Notes (Signed)
Pt drank 12 oz of water without difficulty. Placement of IV site causes pauses in administration of IV fluids. EDP aware.

## 2017-10-27 NOTE — ED Triage Notes (Signed)
Unwitnessed fall from [facility]. Patient believes he has been on floor atleast since midnight. Patient unsure of LOC. EMS vitals 140/108b/p

## 2017-10-27 NOTE — NC FL2 (Signed)
[place] LEVEL OF CARE SCREENING TOOL     IDENTIFICATION  Patient Name: Henry Smith Birthdate: 05/01/1941 Sex: male Admission Date (Current Location): 10/27/2017  [place] and [place] Number:  [name] [id number] [place] and Address:  [facility], [address], [place], [place] [address]      Provider Number: [id number]  Attending Physician Name and Address:  Earleen Newport, MD  Relative Name and Phone Number:  [place] [phone]    Current Level of Care: Hospital Recommended Level of Care: [place] Prior Approval Number:    Date Approved/Denied:   PASRR Number:    Discharge Plan: SNF    Current Diagnoses: Patient Active Problem List   Diagnosis Date Noted  . Gait abnormality 05/06/2017  . Allergic drug rash 03/03/2017  . Altered mental status 02/26/2017  . Syphilis 01/31/2017  . Disorientation   . Generalized weakness 01/30/2017  . Altered mental status, unspecified 01/28/2017  . Hypercalcemia 11/01/2016  . Prostate cancer ([place]) 10/30/2016  . Leukopenia 10/30/2016  . Thrombocytopenia ([place]) 10/30/2016  . Schizophrenia ([place]) 10/26/2016    Orientation RESPIRATION BLADDER Height & Weight     Self  Normal Incontinent Weight: 200 lb (90.7 kg) Height:  6' (182.9 cm)  BEHAVIORAL SYMPTOMS/MOOD NEUROLOGICAL BOWEL NUTRITION STATUS      Continent Diet(Regular diet)  AMBULATORY STATUS COMMUNICATION OF NEEDS Skin   Extensive Assist Verbally Normal                       Personal Care Assistance Level of Assistance  Bathing, Feeding, Dressing Bathing Assistance: Limited assistance Feeding assistance: Independent Dressing Assistance: Limited assistance     Functional Limitations Info  Sight, Hearing, Speech Sight Info: Adequate Hearing Info: Adequate Speech Info: Adequate    SPECIAL CARE FACTORS FREQUENCY                       Contractures Contractures  Info: Not present    Additional Factors Info  Code Status, Allergies Code Status Info: Full Allergies Info: Penicillins           Current Medications (10/27/2017):  This is the current hospital active medication list No current facility-administered medications for this encounter.    Current Outpatient Medications  Medication Sig Dispense Refill  . amantadine (SYMMETREL) 100 MG capsule Take 100 mg 2 (two) times daily by mouth.     . Cholecalciferol (VITAMIN D3) 2000 units capsule Take 2,000 Units daily by mouth.     . divalproex (DEPAKOTE ER) 500 MG 24 hr tablet Take 1,000 mg by mouth at bedtime.    . divalproex (DEPAKOTE) 250 MG DR tablet 250 mg at bedtime.    . gabapentin (NEURONTIN) 300 MG capsule Take 300 mg by mouth 3 (three) times daily.    [place] Kitchen latanoprost (XALATAN) 0.005 % ophthalmic solution Place 1 drop into both eyes at bedtime.    Marland Kitchen losartan (COZAAR) 25 MG tablet Take 25 mg by mouth daily.    . Melatonin 5 MG TABS Take 5 mg by mouth at bedtime.    Marland Kitchen OLANZapine (ZYPREXA) 10 MG tablet Take 10 mg by mouth 2 (two) times daily.    . diphenhydrAMINE (BENADRYL) 25 mg capsule Take 1 capsule (25 mg total) by mouth every 8 (eight) hours as needed for itching (rash). 20 capsule 0  . triamcinolone ointment (KENALOG) 0.1 % Apply 1 application topically 2 (two) times daily as needed.  Discharge Medications: Please see discharge summary for a list of discharge medications.  Relevant Imaging Results:  Relevant Lab Results:   Additional Information SSN: [id number]  Truitt Merle, LCSW

## 2017-10-27 NOTE — ED Notes (Signed)
Multiple attempts to obtain IV access by this RN and Lanny Hurst, [employer].

## 2017-10-27 NOTE — ED Notes (Addendum)
Pt spit all of medications out; states he is not taking them now. Stating that he is leaving. EDP notified. PT attempting to get out of bed. Unable to reorient to situation at this time. Jonni Sanger, EMTP at bedside to reorient patient; pt responds positively to [facility] and is back in bed at this time. Jonni Sanger, at bedside currently. Pt offered urinal, declines.

## 2017-10-27 NOTE — Evaluation (Signed)
Physical Therapy Evaluation Patient Details Name: Henry Smith MRN: [id number] DOB: [birth year] Today's Date: 10/27/2017   History of Present Illness  presented to ER from group home status post unwitnessed fall (multiple falls in previous week) and progressive weakness; work up ongoing.  All imaging, labs thus far negative for acute issue.  Clinical Impression  Upon evaluation, patient alert and oriented to self only; follows simple, one-step commands but requires increased time for processing.  Bilat UE/LE generally weakened with occasional myoclonic jerking noted (worsened with fatigue); placing patient at increased risk for buckling/falls with [place] activities. Currently requiring mod/max assist +1-2 for bed mobility; mod assist +1 for unsupported sitting balance; mod assist +2 for sit/stand and forward/lateral stepping edge of bed with RW.  Very poor standing balance with limited/no self-righting reactions.  Very high risk for fall; unsafe to attempt mobility without +2 at all times. Would benefit from skilled PT to address above deficits and promote optimal return to PLOF; recommend transition to STR upon discharge from acute hospitalization.     Follow Up Recommendations SNF    Equipment Recommendations       Recommendations for Other Services       Precautions / Restrictions Precautions Precautions: Fall Restrictions Weight Bearing Restrictions: No      Mobility  Bed Mobility Overal bed mobility: Needs Assistance Bed Mobility: Supine to Sit;Sit to Supine     Supine to sit: Mod assist Sit to supine: Max assist      Transfers Overall transfer level: Needs assistance Equipment used: Rolling walker (2 wheeled) Transfers: Sit to/from Stand Sit to Stand: Min assist;Mod assist;+2 safety/equipment         General transfer comment: assist for forward trunk lean/weight shift, lift off and standing balance.  Poor righting reactions; excessive posterior weight  shift  Ambulation/Gait Ambulation/Gait assistance: Mod assist;+2 safety/equipment Ambulation Distance (Feet): 3 Feet Assistive device: Rolling walker (2 wheeled)       General Gait Details: single foward/retro stepping at bedside, 2-3 lateral steps edge of bed with RW, mod assist +2.  Constant faciltiation for weight shifting to unweight/advance LEs.  Very fearful of any unexpected weight shifting; very poor standing balance. Intermittent myoclonic jerking; high risk for buckling and falls.  Unsafe/unable to attempt without +2  Stairs            Wheelchair Mobility    Modified Rankin (Stroke Patients Only)       Balance Overall balance assessment: Needs assistance Sitting-balance support: Feet supported;No upper extremity supported Sitting balance-Leahy Scale: Poor Sitting balance - Comments: very delayed, absent spontaneous righting reactions Postural control: Posterior lean   Standing balance-Leahy Scale: Zero                               Pertinent Vitals/Pain Pain Assessment: Faces Faces Pain Scale: No hurt    Home Living Family/patient expects to be discharged to:: Group home                 Additional Comments: Patient poor historian; unable to relate    Prior Function           Comments: Patient poor historian; unable to report. Per Education officer, museum, group home reports patient ambulatory with RW at baseline.     Hand Dominance        Extremity/Trunk Assessment   Upper Extremity Assessment Upper Extremity Assessment: Generalized weakness    Lower Extremity Assessment Lower Extremity Assessment: Generalized  weakness(grossly at least 3-/5 throughout; intermittent myoclonic jerking noted (worsened with fatigue))       Communication   Communication: (speech garbled, non-sensical at times)  Cognition Arousal/Alertness: Awake/alert Behavior During Therapy: WFL for tasks assessed/performed Overall Cognitive Status: No  family/caregiver present to determine baseline cognitive functioning                                 General Comments: oriented to self only; follows simple commands 75% time with delayed processing.  Limited ability to coordinate movement patterns and appropriately initiate/terminate muscle activation      General Comments      Exercises Other Exercises Other Exercises: Sit/stand x4 with RW, min/mod assist+2-working to develop awareness of midline in A/P plane, balance correction to midline (constant posterior weight shift)   Assessment/Plan    PT Assessment Patient needs continued PT services  PT Problem List Decreased strength;Decreased range of motion;Decreased activity tolerance;Decreased balance;Decreased mobility;Decreased coordination;Decreased cognition;Decreased knowledge of use of DME;Decreased safety awareness;Decreased knowledge of precautions       PT Treatment Interventions DME instruction;Gait training;Stair training;Functional mobility training;Balance training;Therapeutic exercise;Therapeutic activities;Patient/family education;Cognitive remediation    PT Goals (Current goals can be found in the Care Plan section)  Acute Rehab PT Goals PT Goal Formulation: Patient unable to participate in goal setting Time For Goal Achievement: 11/10/17 Potential to Achieve Goals: Fair    Frequency Min 2X/week   Barriers to discharge Decreased caregiver support      Co-evaluation               AM-PAC PT "6 Clicks" Daily Activity  Outcome Measure Difficulty turning over in bed (including adjusting bedclothes, sheets and blankets)?: Unable Difficulty moving from lying on back to sitting on the side of the bed? : Unable Difficulty sitting down on and standing up from a chair with arms (e.g., wheelchair, bedside commode, etc,.)?: Unable Help needed moving to and from a bed to chair (including a wheelchair)?: Total Help needed walking in hospital room?:  Total Help needed climbing 3-5 steps with a railing? : Total 6 Click Score: 6    End of Session Equipment Utilized During Treatment: Gait belt Activity Tolerance: Patient tolerated treatment well Patient left: in bed;with call bell/phone within reach;with nursing/sitter in room Nurse Communication: Mobility status PT Visit Diagnosis: Unsteadiness on feet (R26.81);Muscle weakness (generalized) (M62.81);Difficulty in walking, not elsewhere classified (R26.2)    Time: 7544-9201 PT Time Calculation (min) (ACUTE ONLY): 20 min   Charges:   PT Evaluation $PT Eval Low Complexity: 1 Low PT Treatments $Therapeutic Activity: 8-22 mins   PT G Codes:   PT G-Codes **NOT FOR INPATIENT CLASS** Functional Assessment Tool Used: AM-PAC 6 Clicks Basic Mobility Functional Limitation: Mobility: Walking and moving around Mobility: Walking and Moving Around Current Status (E0712): At least 60 percent but less than 80 percent impaired, limited or restricted Mobility: Walking and Moving Around Goal Status [phone]): At least 20 percent but less than 40 percent impaired, limited or restricted    Areeba Sulser H. Owens Shark, PT, DPT, NCS 10/27/17, 5:23 PM [phone]

## 2017-10-27 NOTE — ED Notes (Signed)
Pt eating dinner at this time. Andy at bedside to assist patient.

## 2017-10-27 NOTE — ED Notes (Signed)
pericare performed. Pt incontinent of urine. Pt watching TV at this time. Will not wear cardiac monitor, continues to pull off.

## 2017-10-27 NOTE — ED Notes (Signed)
Given 12 oz water and snack. Drinking and eating at this time. Pt dry.

## 2017-10-27 NOTE — ED Notes (Signed)
Hooked patient up to monitor. 

## 2017-10-27 NOTE — ED Notes (Signed)
Patient spit out pills and so nurse placed in pills in applesauce to reattempt meds being given.

## 2017-10-27 NOTE — ED Notes (Signed)
Placed condom cath on patient. PT tolerated well. Pt refusing to drink any fluids or take medication.

## 2017-10-27 NOTE — ED Notes (Signed)
PT at bedside.

## 2017-10-27 NOTE — ED Notes (Signed)
Called to request condom cath and patient has been catherized X 2 this AM by previous nurse, Narda Rutherford. Spoke with EDP who reccomends condom cath to be placed. Pt has been incontinence of urine X 3 by this RN. Clean brief placed on patient.

## 2017-10-28 DIAGNOSIS — R531 Weakness: Secondary | ICD-10-CM | POA: Diagnosis not present

## 2017-10-28 LAB — GLUCOSE, CAPILLARY
GLUCOSE-CAPILLARY: 59 mg/dL — AB (ref 65–99)
GLUCOSE-CAPILLARY: 70 mg/dL (ref 65–99)
GLUCOSE-CAPILLARY: 88 mg/dL (ref 65–99)
Glucose-Capillary: 79 mg/dL (ref 65–99)
Glucose-Capillary: 93 mg/dL (ref 65–99)
Glucose-Capillary: 86 mg/dL (ref 65–99)

## 2017-10-28 MED ORDER — CEFTRIAXONE SODIUM IN DEXTROSE 20 MG/ML IV SOLN: 1.0000 g | INTRAVENOUS | Status: DC

## 2017-10-28 NOTE — ED Notes (Signed)
Patient will take meds with applesauce

## 2017-10-28 NOTE — ED Notes (Signed)
Sitter at bedside, RR even and unlabored, pt in NAD at this time

## 2017-10-28 NOTE — Clinical Social Work Note (Signed)
[place] number has been received [phone] E, CSW received phone call from [facility] [phone]) at [facility] at [facility], they can accept patient once patient has been without a safety sitter for 24 hours. CSW updated bedside nurse and legal Wilhemina Bonito [phone].  Per legal guardian, patient will have to be transported via EMS to SNF.  CSW to continue to follow patient's progress throughout discharge planning.  Jones Broom. Norval Morton, MSW, [place]  10/28/2017 6:39 PM

## 2017-10-28 NOTE — ED Notes (Signed)
Pt's blood sugar running low per previous RN [place].  Pt has no hx of diabetes.  Spoke with Dr. Owens Shark regarding this.  VO to do Q2hour CBG on pt until discharge.

## 2017-10-28 NOTE — ED Notes (Signed)
Per Randall Hiss, Education officer, museum, pt has placement in [place] but has to be without Air cabin crew x24hrs prior to placement. Charge RN notified, Air cabin crew discontinued at this time, posey and bed alarm in place.

## 2017-10-28 NOTE — ED Notes (Signed)
Pt eating dinner tray without difficulty noted.

## 2017-10-28 NOTE — ED Notes (Addendum)
Pt cbg 59, pt given orange juice and drinking without difficulty. MD aware, at bedside , will recheck glucose

## 2017-10-28 NOTE — ED Notes (Addendum)
Pt sat up in bed at this time, pt drowsy but arousable, pt in NAD at this time, will continue to monitor , VS stable

## 2017-10-28 NOTE — ED Notes (Signed)
Pharmacy consulted about morning meds, states some will make pt drowsy

## 2017-10-28 NOTE — ED Notes (Signed)
Pt awake and alert, VS stable, waiting for sandwich trays from dietary

## 2017-10-28 NOTE — ED Notes (Signed)
Henry Smith from [place] at [name] for skilled placement at bedside at this time

## 2017-10-28 NOTE — ED Notes (Signed)
Pt sleeping at this time, NAD noted, RR even and unlabored, sitter at bedside.

## 2017-10-28 NOTE — ED Notes (Signed)
Pt refuses to eat lunch tray, pt eating chocolate ice cream with sitter at this time

## 2017-10-28 NOTE — ED Notes (Signed)
Pt took meds with applesauce and orange juice, no difficulties noted

## 2017-10-28 NOTE — ED Notes (Signed)
Lunch trays given out to pt at this time

## 2017-10-28 NOTE — ED Notes (Signed)
Pt had all of dinner tray , pt alert and in NAD at this time, RR even and unlabored, pt sitting up in bed

## 2017-10-28 NOTE — ED Notes (Signed)
Pt cleaned from urine at this time, new brief on at this time. Pt repositioned at this time

## 2017-10-29 ENCOUNTER — Inpatient Hospital Stay: Payer: Medicare Other | Admitting: Hematology and Oncology

## 2017-10-29 ENCOUNTER — Emergency Department: Payer: Medicare Other

## 2017-10-29 ENCOUNTER — Inpatient Hospital Stay: Payer: Medicare Other

## 2017-10-29 DIAGNOSIS — R531 Weakness: Secondary | ICD-10-CM | POA: Diagnosis not present

## 2017-10-29 LAB — BASIC METABOLIC PANEL
ANION GAP: 7 (ref 5–15)
BUN: 13 mg/dL (ref 6–20)
CHLORIDE: 104 mmol/L (ref 101–111)
CO2: 30 mmol/L (ref 22–32)
Calcium: 12.3 mg/dL — ABNORMAL HIGH (ref 8.9–10.3)
Creatinine, Ser: 1.18 mg/dL (ref 0.61–1.24)
GFR, EST NON AFRICAN AMERICAN: 59 mL/min — AB (ref >60)
Glucose, Bld: 134 mg/dL — ABNORMAL HIGH (ref 65–99)
POTASSIUM: 4.1 mmol/L (ref 3.5–5.1)
Sodium: 141 mmol/L (ref 135–145)

## 2017-10-29 LAB — CBC
HCT: 50.4 % (ref 40.0–52.0)
Hemoglobin: 16 g/dL (ref 13.0–18.0)
MCH: 27.6 pg (ref 26.0–34.0)
MCHC: 31.6 g/dL — ABNORMAL LOW (ref 32.0–36.0)
MCV: 87.1 fL (ref 80.0–100.0)
Platelets: 109 10*3/uL — ABNORMAL LOW (ref 150–440)
RBC: 5.79 MIL/uL (ref 4.40–5.90)
RDW: 14.6 % — ABNORMAL HIGH (ref 11.5–14.5)
WBC: 3.4 10*3/uL — ABNORMAL LOW (ref 3.8–10.6)

## 2017-10-29 LAB — GLUCOSE, CAPILLARY
GLUCOSE-CAPILLARY: 102 mg/dL — AB (ref 65–99)
GLUCOSE-CAPILLARY: 111 mg/dL — AB (ref 65–99)
GLUCOSE-CAPILLARY: 65 mg/dL (ref 65–99)
GLUCOSE-CAPILLARY: 73 mg/dL (ref 65–99)
GLUCOSE-CAPILLARY: 80 mg/dL (ref 65–99)
GLUCOSE-CAPILLARY: 81 mg/dL (ref 65–99)
GLUCOSE-CAPILLARY: 82 mg/dL (ref 65–99)
GLUCOSE-CAPILLARY: 93 mg/dL (ref 65–99)
Glucose-Capillary: 80 mg/dL (ref 65–99)
Glucose-Capillary: 74 mg/dL (ref 65–99)
Glucose-Capillary: 94 mg/dL (ref 65–99)
Glucose-Capillary: 52 mg/dL — ABNORMAL LOW (ref 65–99)
Glucose-Capillary: 54 mg/dL — ABNORMAL LOW (ref 65–99)

## 2017-10-29 LAB — URINALYSIS, COMPLETE (UACMP) WITH MICROSCOPIC
BACTERIA UA: NONE SEEN
Bilirubin Urine: NEGATIVE
Glucose, UA: NEGATIVE mg/dL
KETONES UR: NEGATIVE mg/dL
Nitrite: NEGATIVE
PH: 7 (ref 5.0–8.0)
Protein, ur: NEGATIVE mg/dL
Specific Gravity, Urine: 1.011 (ref 1.005–1.030)

## 2017-10-29 LAB — URINE CULTURE

## 2017-10-29 MED ORDER — LORAZEPAM 2 MG PO TABS
2.0000 mg | ORAL_TABLET | Freq: Once | ORAL | Status: AC
  Administered 2017-10-29: 2 mg via ORAL
  Filled 2017-10-29: qty 1

## 2017-10-29 MED ORDER — CIPROFLOXACIN HCL 500 MG PO TABS
500.0000 mg | ORAL_TABLET | Freq: Two times a day (BID) | ORAL | Status: DC
  Administered 2017-10-29 – 2017-10-30 (×2): 500 mg via ORAL
  Filled 2017-10-29: qty 1

## 2017-10-29 NOTE — ED Provider Notes (Signed)
Plan is to make sure glucose is stable over the next 12 hours and if so appropriate for discharge to facility arranged by SW   Lavonia Drafts, MD 10/29/17 2000
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
## 2017-10-29 NOTE — ED Notes (Signed)
Patient was attempting to get out of bed.  Patient's gown and linens changed and patient assisted back into a comfortable position in the bed.  Patient is calm and cooperative at this time.  Television turned on per patient request.

## 2017-10-29 NOTE — ED Notes (Signed)
Pt is sitting up eating his breakfast at this time. Will continue to monitor for changes.

## 2017-10-29 NOTE — NC FL2 (Signed)
[place] LEVEL OF CARE SCREENING TOOL     IDENTIFICATION  Patient Name: Henry Smith Birthdate: [birth year] Sex: male Admission Date (Current Location): 10/27/2017  [place] and [place] Number:  [name] [id number] Facility and Address:  [facility], [address] [place], [place] [address]      Provider Number: [id number]  Attending Physician Name and Address:  Rudene Re, MD  Relative Name and Phone Number:  [place] [phone]    Current Level of Care: Hospital Recommended Level of Care: [employer] Prior Approval Number:    Date Approved/Denied:   PASRR Number: [id number]  Discharge Plan: SNF    Current Diagnoses: Patient Active Problem List   Diagnosis Date Noted  . Gait abnormality 05/06/2017  . Allergic drug rash 03/03/2017  . Altered mental status 02/26/2017  . Syphilis 01/31/2017  . Disorientation   . Generalized weakness 01/30/2017  . Altered mental status, unspecified 01/28/2017  . Hypercalcemia 11/01/2016  . Prostate cancer ([place]) 10/30/2016  . Leukopenia 10/30/2016  . Thrombocytopenia ([place]) 10/30/2016  . Schizophrenia ([place]) 10/26/2016    Orientation RESPIRATION BLADDER Height & Weight     Self, Place  Normal Incontinent Weight: 197 lb 14.4 oz (89.8 kg) Height:  6' (182.9 cm)  BEHAVIORAL SYMPTOMS/MOOD NEUROLOGICAL BOWEL NUTRITION STATUS      Continent Diet  Regular  AMBULATORY STATUS COMMUNICATION OF NEEDS Skin   Extensive Assist Verbally Normal                       Personal Care Assistance Level of Assistance  Bathing, Feeding, Dressing Bathing Assistance: Limited assistance Feeding assistance: Independent Dressing Assistance: Limited assistance     Functional Limitations Info  Sight, Hearing, Speech Sight Info: Adequate Hearing Info: Adequate Speech Info: Adequate    SPECIAL CARE FACTORS FREQUENCY  PT (By licensed PT)     PT Frequency: 5x  a week              Contractures Contractures Info: Not present    Additional Factors Info  Code Status, Allergies, Psychotropic Code Status Info: Full Code Allergies Info: PENICILLINS  Psychotropic Info: divalproex (DEPAKOTE) DR tablet 250 mg and divalproex (DEPAKOTE ER) 24 hr tablet 1,000 mg and OLANZapine (ZYPREXA) tablet 10 mg          Current Medications (10/29/2017):  This is the current hospital active medication list Current Facility-Administered Medications  Medication Dose Route Frequency Provider Last Rate Last Dose  . amantadine (SYMMETREL) capsule 100 mg  100 mg Oral BID Earleen Newport, MD   100 mg at 10/28/17 2221  . cholecalciferol (VITAMIN D) tablet 2,000 Units  2,000 Units Oral Daily Earleen Newport, MD   2,000 Units at 10/28/17 1014  . diphenhydrAMINE (BENADRYL) capsule 25 mg  25 mg Oral Q8H PRN Earleen Newport, MD      . divalproex (DEPAKOTE ER) 24 hr tablet 1,000 mg  1,000 mg Oral QHS Earleen Newport, MD   1,000 mg at 10/28/17 2216  . divalproex (DEPAKOTE) DR tablet 250 mg  250 mg Oral QHS Earleen Newport, MD   250 mg at 10/28/17 2222  . gabapentin (NEURONTIN) capsule 300 mg  300 mg Oral TID Earleen Newport, MD   300 mg at 10/28/17 2222  . latanoprost (XALATAN) 0.005 % ophthalmic solution 1 drop  1 drop Both Eyes QHS Earleen Newport, MD   1 drop at 10/28/17 2216  . losartan (COZAAR) tablet  25 mg  25 mg Oral Daily Earleen Newport, MD   25 mg at 10/28/17 1014  . Melatonin TABS 5 mg  5 mg Oral QHS Earleen Newport, MD   5 mg at 10/27/17 2307  . OLANZapine (ZYPREXA) tablet 10 mg  10 mg Oral BID Earleen Newport, MD   10 mg at 10/28/17 2220   Current Outpatient Medications  Medication Sig Dispense Refill  . amantadine (SYMMETREL) 100 MG capsule Take 100 mg 2 (two) times daily by mouth.     . Cholecalciferol (VITAMIN D3) 2000 units capsule Take 2,000 Units daily by mouth.     . divalproex (DEPAKOTE ER) 500 MG 24 hr  tablet Take 1,000 mg by mouth at bedtime.    . divalproex (DEPAKOTE) 250 MG DR tablet 250 mg at bedtime.    . gabapentin (NEURONTIN) 300 MG capsule Take 300 mg by mouth 3 (three) times daily.    Marland Kitchen latanoprost (XALATAN) 0.005 % ophthalmic solution Place 1 drop into both eyes at bedtime.    Marland Kitchen losartan (COZAAR) 25 MG tablet Take 25 mg by mouth daily.    . Melatonin 5 MG TABS Take 5 mg by mouth at bedtime.    Marland Kitchen OLANZapine (ZYPREXA) 10 MG tablet Take 10 mg by mouth 2 (two) times daily.    . diphenhydrAMINE (BENADRYL) 25 mg capsule Take 1 capsule (25 mg total) by mouth every 8 (eight) hours as needed for itching (rash). 20 capsule 0  . triamcinolone ointment (KENALOG) 0.1 % Apply 1 application topically 2 (two) times daily as needed.       Relevant Imaging Results:  Relevant Lab Results:   Additional Information SSN: [id number]  [name]

## 2017-10-29 NOTE — ED Notes (Signed)
Pt is more alert, reports that he was hungry. He ate apple sauce and drank ginger ale. Will continue to monitor for changes.

## 2017-10-29 NOTE — Clinical Social Work Note (Signed)
CSW has spoken to charge nurse and bedside nurse and have verified that patient has not had a safety sitter since 4:30pm yesterday.  Patient also has not been in a posey restraint.  Patient has been resting in bed, he has not tried to get up, and he is alert and oriented.  CSW updated [facility] admissions worker.  Jones Broom. [place], MSW, [place]  10/29/2017 1:56 PM

## 2017-10-29 NOTE — ED Notes (Signed)
Pt in and out cath performed. PT linens changed, given bed bath. Pt in new gown. Pt threw lunch tray at staff.

## 2017-10-29 NOTE — ED Notes (Signed)
Patient refused to take his gabapentin at this time.  Will continue to monitor.

## 2017-10-29 NOTE — ED Notes (Signed)
PT trying to get out of bed. Kicking at staff. Cursing and yelling. EDP aware. Pt pulled out PIV.

## 2017-10-29 NOTE — ED Notes (Signed)
Pt sleeping at this time, pt has equal and unlabored resp noted.

## 2017-10-29 NOTE — ED Notes (Signed)
This tech assisted RN to clean pt. Repositioned pt. Pt resting comfortably.

## 2017-10-29 NOTE — ED Notes (Signed)
Did CBG on pt it was 80. Pt was unable to take sip of OJ in cup. He awakes to verbal stimuli.

## 2017-10-29 NOTE — ED Notes (Addendum)
Pt changed and given peri care. Pt also given 4oz of juice per request.

## 2017-10-29 NOTE — ED Notes (Signed)
Pt blood glucose 102; pt straightened back up in bed and comfortable and watching tv

## 2017-10-29 NOTE — ED Notes (Signed)
Patient eating ham and cheese sandwich without difficulty.

## 2017-10-29 NOTE — Clinical Social Work Note (Addendum)
Patient to be d/c'ed today to [facility] and [facility] [address], [place] [address].  Patient and family agreeable to plans will transport via ems RN to call report [phone] room A1.  CSW updated legal guardian [name] and left message on voice mail at [phone].  Evette Cristal, MSW, [place]

## 2017-10-29 NOTE — ED Notes (Signed)
MD in room to assess patient. Pt does still awake to verbal stimuli. Will recheck sugar in an hour. Pt did take a sip of Ginger Ale. Will continue to monitor.

## 2017-10-29 NOTE — ED Provider Notes (Signed)
No evidence of sepsis on repeat labs. Possibly mild uti, will tx. Wbc 3.4, cxr nad.   Lavonia Drafts, MD 10/29/17 [phone]

## 2017-10-29 NOTE — ED Provider Notes (Addendum)
-----------------------------------------   7:49 AM on 10/29/2017 -----------------------------------------   Blood pressure 135/86, pulse (!) 58, temperature 97.7 F (36.5 C), temperature source Oral, resp. rate 20, height 6' (1.829 m), weight 89.8 kg (197 lb 14.4 oz), SpO2 99 %.  The patient had no acute events since last update.  Calm and cooperative at this time.  Disposition is pending per Psychiatry/Behavioral Medicine team recommendations.     Rudene Re, MD 10/29/17 [phone]    _________________________ 3:19 PM on 10/29/2017 -----------------------------------------  Patient continues to have episodes of hypoglycemia. He does not have h/o DM and is not receiving any insluin in the ER. According to RN staff patient has been eating. Review of patient's workup shows CT concerning for obstructing kidney stone and possible PNA. Also UA concerning for UTI which patient has received fosfomycin for it. Vitals have been stable with no signs of sepsis. Will repeat CXR, UA, labs to eval for evidence of sepsis as possible etiology of patient's hypoglycemia. Patient signed out to Dr. Corky Downs.    Rudene Re, MD 10/29/17 [phone]

## 2017-10-29 NOTE — ED Notes (Signed)
Patient is alert and oriented to self and place.  Patient states, "I would like 2 things, something to eat and to go home."  Plan of care discussed with patient.  Patient given orange juice at this time.

## 2017-10-29 NOTE — ED Notes (Signed)
Patient given dinner tray at this time.

## 2017-10-30 DIAGNOSIS — R531 Weakness: Secondary | ICD-10-CM | POA: Diagnosis not present

## 2017-10-30 LAB — GLUCOSE, CAPILLARY
GLUCOSE-CAPILLARY: 87 mg/dL (ref 65–99)
Glucose-Capillary: 89 mg/dL (ref 65–99)
Glucose-Capillary: 89 mg/dL (ref 65–99)

## 2017-10-30 MED ORDER — DIPHENHYDRAMINE HCL 25 MG PO CAPS
25.0000 mg | ORAL_CAPSULE | Freq: Once | ORAL | Status: AC
  Administered 2017-10-30: 25 mg via ORAL
  Filled 2017-10-30: qty 1

## 2017-10-30 MED ORDER — OLANZAPINE 10 MG PO TABS: 10.0000 mg | ORAL_TABLET | Freq: Every day | ORAL | Status: DC

## 2017-10-30 MED ORDER — FOSFOMYCIN TROMETHAMINE 3 G PO PACK
3.0000 g | PACK | Freq: Once | ORAL | Status: AC
  Administered 2017-10-30: 3 g via ORAL
  Filled 2017-10-30: qty 3

## 2017-10-30 MED ORDER — DIVALPROEX SODIUM 500 MG PO DR TAB
1000.0000 mg | DELAYED_RELEASE_TABLET | Freq: Once | ORAL | Status: AC
  Administered 2017-10-30: 1000 mg via ORAL
  Filled 2017-10-30: qty 2

## 2017-10-30 MED ORDER — ACETAMINOPHEN 325 MG PO TABS
650.0000 mg | ORAL_TABLET | Freq: Once | ORAL | Status: AC
Start: 2017-10-30 — End: 2017-10-30
  Administered 2017-10-30: 650 mg via ORAL
  Filled 2017-10-30: qty 2

## 2017-10-30 NOTE — ED Notes (Signed)
Pt did not swallow any of his pills. All pills found on his gown. Dr Owens Shark reordered medications that are essential.

## 2017-10-30 NOTE — ED Notes (Addendum)
Pt on fall alarm bed, lights blinking. Yellow socks and yellow fall risk band placed on pt by this rn at 0316. Bed in low position, siderails up, fall mats around bed.

## 2017-10-30 NOTE — ED Provider Notes (Signed)
Patient without any complaints at this time.  Has been accepted to a sniff and will be discharged later in the day.  Glucose has been stable in the 80s.   [name], MD 10/30/17 [phone]

## 2017-10-30 NOTE — ED Notes (Signed)
Pt less restless, more calm.

## 2017-10-30 NOTE — ED Notes (Signed)
Pt attempting to get out of bed. Pt informed will need to stay in bed. Pt is now verbal, asking what weather is like outside.

## 2017-10-30 NOTE — ED Notes (Addendum)
Patient rolled to alleviate sacral pressure, heels floated, linens changed. Bed Alarm set, Floor mats in place, fall signage in place. Patient resting comfortably

## 2017-10-30 NOTE — ED Notes (Signed)
Pt sleeping. 

## 2017-10-30 NOTE — ED Notes (Signed)
On arrival into room to check on pt after report, pt awake sitting up in bed with unlabored resps. Diminished breath sounds in all lung fields noted. Skin slightly hot, dry. Pt does not follow commands, but moves face to track rn. Moist oral mucus membranes noted. Pt has dried intact pills noted on gown. Previous rn notified, md notified by previous rn and order received for additional medication. Pt has saturated urine diaper and wet sheets.

## 2017-10-30 NOTE — ED Notes (Addendum)
Today plan to go to [facility] [address] [place] [address].

## 2017-10-30 NOTE — ED Notes (Signed)
Patient rolled to back to center, heels floated, linens adjusted. Bed Alarm set, Floor mats in place, fall signage in place. Patient resting comfortably

## 2017-10-30 NOTE — ED Notes (Signed)
Pt continues to have periods of attempting to get out of bed, however are less so per tracy ednt. Will recheck temperature at 0530.

## 2017-10-30 NOTE — ED Notes (Signed)
Sheets and brief changed. Pt with 100.1 axillary temp noted. Dr. Owens Shark notified, order for tylenol received. Will crush pills and place in applesauce.

## 2017-10-30 NOTE — ED Notes (Signed)
Pills crushed and placed in applesauce. Pt tolerated well.

## 2017-10-30 NOTE — ED Notes (Signed)
Report from [employer], rn.

## 2017-10-30 NOTE — ED Notes (Signed)
Report to donald, rn.  

## 2017-10-30 NOTE — Progress Notes (Signed)
Type of Service: Clinical Social Work Consult  Patient medically cleared by MD this AM.  RN has called report and patient is ready to D/C to SNF in [place] via [facility]. EMS.  Called legal guardian [name] and left 2nd  message on voice mail at [phone].    No other services needs at this time  Casimer Lanius, LCSW Licensed Clinical Social Worker 9:59 AM

## 2017-10-30 NOTE — ED Notes (Signed)
Pt continues to sleep, resps unlabored. Brief dry.

## 2017-10-31 LAB — URINE CULTURE: CULTURE: NO GROWTH

## 2017-11-01 LAB — GLUCOSE, CAPILLARY: GLUCOSE-CAPILLARY: 66 mg/dL (ref 65–99)

## 2017-12-24 ENCOUNTER — Emergency Department
Admission: EM | Admit: 2017-12-24 | Discharge: 2017-12-28 | Disposition: A | Discharge status: Other Acute Inpatient Hospital | Payer: Medicare Other | Attending: Emergency Medicine | Admitting: Emergency Medicine

## 2017-12-24 ENCOUNTER — Other Ambulatory Visit: Payer: Self-pay

## 2017-12-24 ENCOUNTER — Encounter: Payer: Self-pay | Admitting: Emergency Medicine

## 2017-12-24 DIAGNOSIS — F17210 Nicotine dependence, cigarettes, uncomplicated: Secondary | ICD-10-CM | POA: Insufficient documentation

## 2017-12-24 DIAGNOSIS — F203 Undifferentiated schizophrenia: Secondary | ICD-10-CM

## 2017-12-24 DIAGNOSIS — F209 Schizophrenia, unspecified: Secondary | ICD-10-CM | POA: Diagnosis not present

## 2017-12-24 DIAGNOSIS — Z79899 Other long term (current) drug therapy: Secondary | ICD-10-CM | POA: Insufficient documentation

## 2017-12-24 DIAGNOSIS — Z046 Encounter for general psychiatric examination, requested by authority: Secondary | ICD-10-CM | POA: Diagnosis present

## 2017-12-24 LAB — URINE DRUG SCREEN, QUALITATIVE (ARMC ONLY)
AMPHETAMINES, UR SCREEN: NOT DETECTED
Barbiturates, Ur Screen: NOT DETECTED
Benzodiazepine, Ur Scrn: NOT DETECTED
Cannabinoid 50 Ng, Ur ~~LOC~~: NOT DETECTED
Cocaine Metabolite,Ur ~~LOC~~: NOT DETECTED
MDMA (ECSTASY) UR SCREEN: NOT DETECTED
Methadone Scn, Ur: NOT DETECTED
OPIATE, UR SCREEN: NOT DETECTED
PHENCYCLIDINE (PCP) UR S: NOT DETECTED
Tricyclic, Ur Screen: NOT DETECTED

## 2017-12-24 LAB — CBC
HEMATOCRIT: 47.9 % (ref 40.0–52.0)
HEMOGLOBIN: 15.4 g/dL (ref 13.0–18.0)
MCH: 27.8 pg (ref 26.0–34.0)
MCHC: 32.2 g/dL (ref 32.0–36.0)
MCV: 86.6 fL (ref 80.0–100.0)
Platelets: 128 10*3/uL — ABNORMAL LOW (ref 150–440)
RBC: 5.53 MIL/uL (ref 4.40–5.90)
RDW: 15.1 % — ABNORMAL HIGH (ref 11.5–14.5)
WBC: 3.4 10*3/uL — AB (ref 3.8–10.6)

## 2017-12-24 LAB — COMPREHENSIVE METABOLIC PANEL
ALBUMIN: 4.2 g/dL (ref 3.5–5.0)
ALT: 24 U/L (ref 17–63)
AST: 19 U/L (ref 15–41)
Alkaline Phosphatase: 87 U/L (ref 38–126)
Anion gap: 7 (ref 5–15)
BILIRUBIN TOTAL: 0.5 mg/dL (ref 0.3–1.2)
BUN: 11 mg/dL (ref 6–20)
CHLORIDE: 106 mmol/L (ref 101–111)
CO2: 28 mmol/L (ref 22–32)
Calcium: 11.7 mg/dL — ABNORMAL HIGH (ref 8.9–10.3)
Creatinine, Ser: 0.84 mg/dL (ref 0.61–1.24)
GFR calc Af Amer: >60 mL/min (ref >60)
GFR calc non Af Amer: >60 mL/min (ref >60)
GLUCOSE: 101 mg/dL — AB (ref 65–99)
POTASSIUM: 3.9 mmol/L (ref 3.5–5.1)
Sodium: 141 mmol/L (ref 135–145)
Total Protein: 7.5 g/dL (ref 6.5–8.1)

## 2017-12-24 LAB — ETHANOL: Alcohol, Ethyl (B): <10 mg/dL (ref <10)

## 2017-12-24 LAB — ACETAMINOPHEN LEVEL: Acetaminophen (Tylenol), Serum: <10 ug/mL — ABNORMAL LOW (ref 10–30)

## 2017-12-24 LAB — SALICYLATE LEVEL: Salicylate Lvl: <7 mg/dL (ref 2.8–30.0)

## 2017-12-24 MED ORDER — DIVALPROEX SODIUM ER 250 MG PO TB24
ORAL_TABLET | ORAL | Status: AC
  Administered 2017-12-24: 1250 mg via ORAL
  Filled 2017-12-24: qty 1

## 2017-12-24 MED ORDER — DIVALPROEX SODIUM ER 500 MG PO TB24
1250.0000 mg | ORAL_TABLET | Freq: Every day | ORAL | Status: DC
  Administered 2017-12-24 – 2017-12-28 (×4): 1250 mg via ORAL
  Filled 2017-12-24 (×4): qty 1

## 2017-12-24 MED ORDER — OLANZAPINE 10 MG PO TABS
10.0000 mg | ORAL_TABLET | Freq: Two times a day (BID) | ORAL | Status: DC
  Administered 2017-12-24 – 2017-12-28 (×9): 10 mg via ORAL
  Filled 2017-12-24 (×9): qty 1

## 2017-12-24 MED ORDER — DIVALPROEX SODIUM ER 500 MG PO TB24
ORAL_TABLET | ORAL | Status: AC
  Filled 2017-12-24: qty 2

## 2017-12-24 NOTE — ED Notes (Signed)
[place] [phone]

## 2017-12-24 NOTE — ED Notes (Signed)
Meal tray and drink given by Vaughan Basta, [place]
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
## 2017-12-24 NOTE — ED Notes (Signed)
Dr. Clapacs and TTS at bedside. 

## 2017-12-25 DIAGNOSIS — F209 Schizophrenia, unspecified: Secondary | ICD-10-CM | POA: Diagnosis not present

## 2017-12-25 MED ORDER — DIVALPROEX SODIUM 250 MG PO DR TAB
DELAYED_RELEASE_TABLET | ORAL | Status: AC
  Filled 2017-12-25: qty 1

## 2017-12-25 MED ORDER — DIVALPROEX SODIUM 500 MG PO DR TAB
DELAYED_RELEASE_TABLET | ORAL | Status: AC
  Administered 2017-12-25: 1250 mg
  Filled 2017-12-25: qty 2

## 2017-12-25 NOTE — ED Notes (Signed)

## 2017-12-25 NOTE — ED Notes (Addendum)
Pt asleep in bed this evening. Reports feeling safe at this time. No complaints. 15 minute checks ongoing for safety.

## 2017-12-25 NOTE — BH Assessment (Signed)
Per [facility]U "Rounding" Attending Physician (Dr. Vito Berger) the unit census will be holding at 22 till further notice. Thus, patient is being referred out.

## 2017-12-25 NOTE — ED Notes (Signed)
Pt calm and cooperative.  Denies SI/HI and AVH. Pt presents with mild confusion. Compliant with morning medication. Maintained on 15 minute checks and observation by security camera for safety.

## 2017-12-25 NOTE — ED Notes (Signed)
Patient has taken breakfast without assistance. Amb to BR to void without assistance. Denies discomfort.

## 2017-12-25 NOTE — ED Notes (Signed)
Patient asleep in room. No noted distress or abnormal behavior. Will continue 15 minute checks and observation by security cameras for safety. 

## 2017-12-25 NOTE — ED Notes (Signed)
BEHAVIORAL HEALTH ROUNDING Patient sleeping: No. Patient alert and oriented: yes Behavior appropriate: Yes.  ; If no, describe:  Nutrition and fluids offered: Yes  Toileting and hygiene offered: Yes  Sitter present: not applicable Law enforcement present: Yes  

## 2017-12-25 NOTE — ED Notes (Signed)
Pt Voluntary pending placement.

## 2017-12-25 NOTE — ED Notes (Signed)
Patient resting quietly in room. No noted distress or abnormal behaviors noted. Will continue 15 minute checks and observation by security camera for safety. 

## 2017-12-25 NOTE — ED Provider Notes (Signed)
-----------------------------------------   7:14 AM on 12/25/2017 -----------------------------------------   Blood pressure 111/89, pulse 75, temperature 98.1 F (36.7 C), temperature source Oral, resp. rate 18, height 5\' 10"  (1.778 m), weight 89.4 kg (197 lb), SpO2 97 %.  The patient had no acute events since last update.  Calm and cooperative at this time.  Disposition is pending Psychiatry/Behavioral Medicine team recommendations.     Paulette Blanch, MD 12/25/17 [phone]

## 2017-12-25 NOTE — BH Assessment (Signed)
Referral information for Psychiatric Hospitalization faxed to;   [facility] [phone])  . [facility] [phone])     . [facility] [phone])  . [place] [phone])  . [facility] [phone]),   . [facility] [phone]),   . [facility] [phone]),   . [address] [phone]),   . [facility] [phone]) and [phone])  . [place] [phone] or [phone]),
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
## 2017-12-26 DIAGNOSIS — F209 Schizophrenia, unspecified: Secondary | ICD-10-CM | POA: Diagnosis not present

## 2017-12-26 NOTE — BH Assessment (Signed)
Pending review with [facility] ([phone])

## 2017-12-26 NOTE — ED Provider Notes (Signed)
-----------------------------------------   6:59 AM on 12/26/2017 -----------------------------------------   Blood pressure 129/80, pulse (!) 16, temperature 98.1 F (36.7 C), temperature source Oral, resp. rate 16, height 5\' 10"  (1.778 m), weight 89.4 kg (197 lb), SpO2 100 %.  The patient had no acute events since last update.  Calm and cooperative at this time.  Disposition is pending Psychiatry/Behavioral Medicine team recommendations.     Loney Hering, MD 12/26/17 [phone]

## 2017-12-26 NOTE — ED Notes (Signed)
Patient resting quietly in room. No noted distress or abnormal behaviors noted. Will continue 15 minute checks and observation by security camera for safety. 

## 2017-12-26 NOTE — BH Assessment (Signed)
Follow up on information for Psychiatric Hospitalization faxed to;    [facility] [phone])- Orvil Feil states information to be reviewed   [facility] [phone])      [facility] [phone])   [facility] Hoes [phone])   [facility] [phone]),    T[facility] Babinski [phone]), left message   [facility] [phone]),    Servando Snare [phone]),    [facility] [phone]) and [phone])   [facility] [phone] or [phone]), -no beds available per Caryl Pina

## 2017-12-27 ENCOUNTER — Ambulatory Visit: Payer: Self-pay | Admitting: "Endocrinology

## 2017-12-27 DIAGNOSIS — F203 Undifferentiated schizophrenia: Secondary | ICD-10-CM | POA: Diagnosis not present

## 2017-12-27 DIAGNOSIS — F209 Schizophrenia, unspecified: Secondary | ICD-10-CM | POA: Diagnosis not present

## 2017-12-27 NOTE — ED Notes (Signed)
Pt. Alert and oriented, warm and dry, in no distress. Pt. Denies SI, HI, and AVH. Pt. Encouraged to let nursing staff know of any concerns or needs. 

## 2017-12-27 NOTE — ED Notes (Signed)
Pt laying in bed with eyes open on approach. He is calm and cooperative. No acute distress noted. States he continues to feel well and has a good appetite. Forwards little and is circumstantial in thought. Denies SI/HI/AVH and contracts for safety. Denies pain/discomfort. POC reviewed and understanding verbalized. He offered no questions or concerns. Safety has been maintained with q15 min checks, hourly rounding and ODS obs. Will continue current POC pending disposition.
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
## 2017-12-27 NOTE — ED Notes (Signed)

## 2017-12-27 NOTE — ED Provider Notes (Signed)
-----------------------------------------   7:08 AM on 12/27/2017 -----------------------------------------   Blood pressure (!) 137/93, pulse 80, temperature (!) 97.5 F (36.4 C), temperature source Oral, resp. rate 20, height 5\' 10"  (1.778 m), weight 89.4 kg (197 lb), SpO2 100 %.  The patient had no acute events since last update.  Calm and cooperative at this time.  Disposition is pending Psychiatry/Behavioral Medicine team recommendations.     [name], MD 12/27/17 [phone]

## 2017-12-27 NOTE — BH Assessment (Addendum)
Writer followed up with referrals;   [facility] ([phone]), No appropriate beds.   [facility] ([phone]), declined due to medical acuity.   [facility] [phone]), unable to reach anyone    [facility] [phone]), unable to reach anyone   [facility] ([phone]), declined due to medical acuity.   [facility] [phone]), unable to reach anyone.   [facility] [phone]), unable to reach anyone,   [facility] [phone]), unable to reach anyone   [facility] [phone]), unable to reach anyone   [facility] [phone]), information refaxed.   [place] ([phone]), pending review.

## 2017-12-27 NOTE — BH Assessment (Signed)
Per the report of the patient's guardian, [facility] [name] [phone]), patient is able to return to the facility, [facility] [phone])   Writer spoke with the facility's ([facility])  Scientist, physiological (Tammy [phone]), she request if whenever he is discharged, contact her. She want to do "in person assessment" with the patient, to ensure they are able to manage him. Even if he is admitted to another facility, [facility].

## 2017-12-27 NOTE — ED Notes (Signed)
VOL/Pending Placement 

## 2017-12-28 ENCOUNTER — Inpatient Hospital Stay
Admission: AD | Admit: 2017-12-28 | Discharge: 2018-01-06 | DRG: 885 | Disposition: A | Discharge status: Nursing Home (Includes ICF) | Payer: Medicare Other | Source: Intra-hospital | Attending: Psychiatry | Admitting: Psychiatry

## 2017-12-28 DIAGNOSIS — Z880 Allergy status to penicillin: Secondary | ICD-10-CM

## 2017-12-28 DIAGNOSIS — A529 Late syphilis, unspecified: Secondary | ICD-10-CM | POA: Diagnosis present

## 2017-12-28 DIAGNOSIS — F17210 Nicotine dependence, cigarettes, uncomplicated: Secondary | ICD-10-CM | POA: Diagnosis present

## 2017-12-28 DIAGNOSIS — F0280 Dementia in other diseases classified elsewhere without behavioral disturbance: Secondary | ICD-10-CM | POA: Diagnosis present

## 2017-12-28 DIAGNOSIS — Z9114 Patient's other noncompliance with medication regimen: Secondary | ICD-10-CM | POA: Diagnosis not present

## 2017-12-28 DIAGNOSIS — Z8546 Personal history of malignant neoplasm of prostate: Secondary | ICD-10-CM | POA: Diagnosis not present

## 2017-12-28 DIAGNOSIS — F17200 Nicotine dependence, unspecified, uncomplicated: Secondary | ICD-10-CM | POA: Diagnosis present

## 2017-12-28 DIAGNOSIS — H409 Unspecified glaucoma: Secondary | ICD-10-CM | POA: Diagnosis present

## 2017-12-28 DIAGNOSIS — F250 Schizoaffective disorder, bipolar type: Secondary | ICD-10-CM | POA: Diagnosis present

## 2017-12-28 DIAGNOSIS — I10 Essential (primary) hypertension: Secondary | ICD-10-CM | POA: Diagnosis present

## 2017-12-28 DIAGNOSIS — Z79899 Other long term (current) drug therapy: Secondary | ICD-10-CM

## 2017-12-28 DIAGNOSIS — R410 Disorientation, unspecified: Secondary | ICD-10-CM | POA: Diagnosis present

## 2017-12-28 DIAGNOSIS — F209 Schizophrenia, unspecified: Secondary | ICD-10-CM | POA: Diagnosis not present

## 2017-12-28 DIAGNOSIS — F203 Undifferentiated schizophrenia: Secondary | ICD-10-CM | POA: Diagnosis not present

## 2017-12-28 DIAGNOSIS — R4189 Other symptoms and signs involving cognitive functions and awareness: Secondary | ICD-10-CM

## 2017-12-28 MED ORDER — OLANZAPINE 10 MG PO TABS
10.0000 mg | ORAL_TABLET | Freq: Two times a day (BID) | ORAL | Status: DC
  Administered 2017-12-29: 10 mg via ORAL
  Filled 2017-12-28: qty 1

## 2017-12-28 MED ORDER — ACETAMINOPHEN 325 MG PO TABS: 650.0000 mg | ORAL_TABLET | Freq: Four times a day (QID) | ORAL | Status: DC | PRN

## 2017-12-28 MED ORDER — AMANTADINE HCL 100 MG PO CAPS
100.0000 mg | ORAL_CAPSULE | Freq: Two times a day (BID) | ORAL | Status: DC
  Administered 2017-12-29 – 2018-01-06 (×17): 100 mg via ORAL
  Filled 2017-12-28 (×17): qty 1

## 2017-12-28 MED ORDER — TRAZODONE HCL 100 MG PO TABS
100.0000 mg | ORAL_TABLET | Freq: Every evening | ORAL | Status: DC | PRN
  Administered 2017-12-28: 100 mg via ORAL
  Filled 2017-12-28 (×2): qty 1

## 2017-12-28 MED ORDER — LOSARTAN POTASSIUM 25 MG PO TABS
25.0000 mg | ORAL_TABLET | Freq: Every day | ORAL | Status: DC
Start: 2017-12-29 — End: 2018-01-06
  Administered 2017-12-29 – 2018-01-06 (×9): 25 mg via ORAL
  Filled 2017-12-28 (×9): qty 1

## 2017-12-28 MED ORDER — DIVALPROEX SODIUM ER 500 MG PO TB24
1250.0000 mg | ORAL_TABLET | Freq: Every day | ORAL | Status: DC
  Administered 2017-12-29 – 2018-01-05 (×8): 1250 mg via ORAL
  Filled 2017-12-28 (×8): qty 1

## 2017-12-28 MED ORDER — ALUM & MAG HYDROXIDE-SIMETH 200-200-20 MG/5ML PO SUSP: 30.0000 mL | ORAL | Status: DC | PRN

## 2017-12-28 MED ORDER — HYDROXYZINE HCL 50 MG PO TABS
50.0000 mg | ORAL_TABLET | Freq: Three times a day (TID) | ORAL | Status: DC | PRN
  Administered 2018-01-02: 50 mg via ORAL
  Filled 2017-12-28 (×2): qty 1

## 2017-12-28 MED ORDER — MAGNESIUM HYDROXIDE 400 MG/5ML PO SUSP: 30.0000 mL | Freq: Every day | ORAL | Status: DC | PRN

## 2017-12-28 NOTE — ED Notes (Addendum)
Patient to be discharge to [facility] room 309 Patient aware of transfer and is agreeable with plan.

## 2017-12-28 NOTE — ED Provider Notes (Signed)
-----------------------------------------   7:42 AM on 12/28/2017 -----------------------------------------   Blood pressure 133/87, pulse 79, temperature 97.9 F (36.6 C), temperature source Oral, resp. rate 18, height 5\' 10"  (1.778 m), weight 89.4 kg (197 lb), SpO2 100 %.  The patient had no acute events since last update.  Calm and cooperative at this time.  Disposition is pending Psychiatry/Behavioral Medicine team recommendations.      Eula Listen, MD 12/28/17 [phone]

## 2017-12-28 NOTE — Progress Notes (Signed)
Pt asleep at present. A & O X3. Denies SI, HI, AVH and pain when assessed. Mildly confused but is verbally redirectable and cooperative with unit routines. Showered, changed scrubs and bed linens with staff assistance. Remains medication compliant. Support and availability provided to pt. Encouraged pt to voice concerns. Safety checks maintained at Q 15 minutes intervals without self harm gestures to report at this time.

## 2017-12-28 NOTE — Progress Notes (Signed)
Skin assessment is complete, patient skin is clean without any breaks, nurse found a lump on patient's upper right arm pitt area that is benign and non tender, patient states that is been there for long time noted, body search is done without any contraband found on patient.
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
## 2017-12-28 NOTE — BH Assessment (Signed)
Writer called and left a HIPPA Compliant message with Guardian (Chavis [phone]), requesting a return phone call.

## 2017-12-28 NOTE — BH Assessment (Signed)
Writer received phone call from patient's guardian [name] [phone]) and received verbal permission for the patient to be admitted to [facility].

## 2017-12-28 NOTE — ED Notes (Signed)
Pt. Alert and oriented, warm and dry, in no distress. Pt. Denies SI, HI, and AVH. Pt. Encouraged to let nursing staff know of any concerns or needs. 

## 2017-12-28 NOTE — BH Assessment (Signed)
Patient is to be admitted to [facility] by Dr. Weber Cooks.  Attending Physician will be Dr. Wonda Olds.   Patient has been assigned to room 310, by [facility] staff is aware of the admission (Emilie, ER Dian Situ, Dr. Reita Cliche, , ER MD; Erick Alley, Patient Access).

## 2017-12-28 NOTE — ED Notes (Signed)

## 2017-12-28 NOTE — ED Notes (Signed)
Pt Voluntary pending placement.

## 2017-12-29 ENCOUNTER — Other Ambulatory Visit: Payer: Self-pay

## 2017-12-29 DIAGNOSIS — R4189 Other symptoms and signs involving cognitive functions and awareness: Secondary | ICD-10-CM

## 2017-12-29 DIAGNOSIS — F250 Schizoaffective disorder, bipolar type: Secondary | ICD-10-CM

## 2017-12-29 LAB — TSH: TSH: 1.143 u[IU]/mL (ref 0.350–4.500)

## 2017-12-29 LAB — URINALYSIS, COMPLETE (UACMP) WITH MICROSCOPIC
Bacteria, UA: NONE SEEN
Bilirubin Urine: NEGATIVE
Glucose, UA: NEGATIVE mg/dL
Hgb urine dipstick: NEGATIVE
Ketones, ur: 5 mg/dL — AB
Leukocytes, UA: NEGATIVE
Nitrite: NEGATIVE
Protein, ur: NEGATIVE mg/dL
Specific Gravity, Urine: 1.014 (ref 1.005–1.030)
Squamous Epithelial / HPF: NONE SEEN
pH: 6 (ref 5.0–8.0)

## 2017-12-29 LAB — LIPID PANEL
Cholesterol: 119 mg/dL (ref 0–200)
HDL: 49 mg/dL
LDL Cholesterol: 52 mg/dL (ref 0–99)
Total CHOL/HDL Ratio: 2.4 ratio
Triglycerides: 90 mg/dL
VLDL: 18 mg/dL (ref 0–40)

## 2017-12-29 LAB — URINE DRUG SCREEN, QUALITATIVE (ARMC ONLY)
Amphetamines, Ur Screen: NOT DETECTED
BARBITURATES, UR SCREEN: NOT DETECTED
BENZODIAZEPINE, UR SCRN: NOT DETECTED
CANNABINOID 50 NG, UR ~~LOC~~: NOT DETECTED
COCAINE METABOLITE, UR ~~LOC~~: NOT DETECTED
MDMA (Ecstasy)Ur Screen: NOT DETECTED
METHADONE SCREEN, URINE: NOT DETECTED
OPIATE, UR SCREEN: NOT DETECTED
Phencyclidine (PCP) Ur S: NOT DETECTED
Tricyclic, Ur Screen: NOT DETECTED

## 2017-12-29 LAB — HEMOGLOBIN A1C
Hgb A1c MFr Bld: 5.2 % (ref 4.8–5.6)
Mean Plasma Glucose: 102.54 mg/dL

## 2017-12-29 LAB — VALPROIC ACID LEVEL: Valproic Acid Lvl: 73 ug/mL (ref 50.0–100.0)

## 2017-12-29 MED ORDER — OLANZAPINE 5 MG PO TABS
5.0000 mg | ORAL_TABLET | ORAL | Status: DC
  Administered 2017-12-30: 5 mg via ORAL
  Filled 2017-12-29: qty 1

## 2017-12-29 MED ORDER — OLANZAPINE 5 MG PO TABS
5.0000 mg | ORAL_TABLET | Freq: Once | ORAL | Status: AC
  Administered 2017-12-29: 5 mg via ORAL
  Filled 2017-12-29: qty 1

## 2017-12-29 MED ORDER — OLANZAPINE 10 MG PO TABS: 10.0000 mg | ORAL_TABLET | Freq: Every day | ORAL | Status: DC

## 2017-12-29 MED ORDER — HALOPERIDOL 5 MG PO TABS
5.0000 mg | ORAL_TABLET | Freq: Every day | ORAL | Status: DC
  Administered 2017-12-29 – 2017-12-30 (×2): 5 mg via ORAL
  Filled 2017-12-29 (×2): qty 1

## 2017-12-29 MED ORDER — OLANZAPINE 5 MG PO TABS: 5.0000 mg | ORAL_TABLET | ORAL | Status: DC

## 2017-12-29 MED ORDER — OLANZAPINE 5 MG PO TABS: 5.0000 mg | ORAL_TABLET | Freq: Two times a day (BID) | ORAL | Status: DC

## 2017-12-29 NOTE — BHH Counselor (Signed)
CSW called patients Guardian Henry Smith and left a voicemail for him to return call.    Darin Engels, MSW, [place], LCASA 12/29/2017 9:13 AM

## 2017-12-29 NOTE — Plan of Care (Signed)
Patient slept for Estimated Hours of 5.45; Precautionary checks every 15 minutes for safety maintained, room free of safety hazards, patient sustains no injury or falls during this shift.
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
## 2017-12-29 NOTE — BHH Counselor (Signed)
CSW spoke with Thressa Sheller from [facility] to obtain collateral information. Thayer Headings will fax over patient information. This information will be placed in the patients file.  Darin Engels, MSW, [place], LCASA 12/29/2017 1:45 PM
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
## 2017-12-29 NOTE — Plan of Care (Signed)
Patient is up ad lib with steady gait, compliant with meals and medication. Affect is pleasant, able to verbalize needs to staff.

## 2017-12-29 NOTE — Progress Notes (Signed)
Patient remains alert and responsive. Ambulates on unit wiithout any assistive devices. Patient is labeled as a Fall Risk, yellow band to wrist in place. Milieu remains safe with q 15 safety checks. Will continue to monitor.

## 2017-12-29 NOTE — Tx Team (Addendum)
Interdisciplinary Treatment and Diagnostic Plan Update  12/29/2017 Time of Session: 11am Henry Smith MRN: [id number]  Principal Diagnosis: <principal problem not specified>  Secondary Diagnoses: Active Problems:   Schizophrenia ([place])   Current Medications:  Current Facility-Administered Medications  Medication Dose Route Frequency Provider Last Rate Last Dose  . acetaminophen (TYLENOL) tablet 650 mg  650 mg Oral Q6H PRN Clapacs, John T, MD      . alum & mag hydroxide-simeth (MAALOX/MYLANTA) 200-200-20 MG/5ML suspension 30 mL  30 mL Oral Q4H PRN Clapacs, John T, MD      . amantadine (SYMMETREL) capsule 100 mg  100 mg Oral BID Clapacs, Madie Reno, MD   100 mg at 12/29/17 0759  . divalproex (DEPAKOTE ER) 24 hr tablet 1,250 mg  1,250 mg Oral QHS Clapacs, John T, MD      . hydrOXYzine (ATARAX/VISTARIL) tablet 50 mg  50 mg Oral TID PRN Clapacs, John T, MD      . losartan (COZAAR) tablet 25 mg  25 mg Oral Daily Clapacs, Madie Reno, MD   25 mg at 12/29/17 0759  . magnesium hydroxide (MILK OF MAGNESIA) suspension 30 mL  30 mL Oral Daily PRN Clapacs, John T, MD      . OLANZapine (ZYPREXA) tablet 10 mg  10 mg Oral BID Clapacs, Madie Reno, MD   10 mg at 12/29/17 0759  . traZODone (DESYREL) tablet 100 mg  100 mg Oral QHS PRN Clapacs, Madie Reno, MD   100 mg at 12/28/17 2349   PTA Medications: Medications Prior to Admission  Medication Sig Dispense Refill Last Dose  . amantadine (SYMMETREL) 100 MG capsule Take 100 mg 2 (two) times daily by mouth.    10/27/2017 at 0800  . diphenhydrAMINE (BENADRYL) 25 mg capsule Take 1 capsule (25 mg total) by mouth every 8 (eight) hours as needed for itching (rash). 20 capsule 0 prn at prn  . divalproex (DEPAKOTE ER) 500 MG 24 hr tablet Take 1,000 mg by mouth at bedtime.   10/27/2017 at 0800  . divalproex (DEPAKOTE) 250 MG DR tablet 250 mg at bedtime.   10/27/2017 at 0800  . gabapentin (NEURONTIN) 300 MG capsule Take 300 mg by mouth 3 (three) times daily.   10/27/2017 at  Unknown time  . latanoprost (XALATAN) 0.005 % ophthalmic solution Place 1 drop into both eyes at bedtime.   10/27/2017 at 0800  . losartan (COZAAR) 25 MG tablet Take 25 mg by mouth daily.   10/27/2017 at 0800  . Melatonin 5 MG TABS Take 5 mg by mouth at bedtime.   10/26/2017 at 2000  . OLANZapine (ZYPREXA) 10 MG tablet Take 10 mg by mouth 2 (two) times daily.   10/27/2017 at 0800  . triamcinolone ointment (KENALOG) 0.1 % Apply 1 application topically 2 (two) times daily as needed.   prn at prn    Patient Stressors: Financial difficulties Health problems  Patient Strengths: Motivation for treatment/growth Supportive family/friends  Treatment Modalities: Medication Management, Group therapy, Case management,  1 to 1 session with clinician, Psychoeducation, Recreational therapy.   Physician Treatment Plan for Primary Diagnosis: <principal problem not specified> Long Term Goal(s):     Short Term Goals:    Medication Management: Evaluate patient's response, side effects, and tolerance of medication regimen.  Therapeutic Interventions: 1 to 1 sessions, Unit Group sessions and Medication administration.  Evaluation of Outcomes: Progressing  Physician Treatment Plan for Secondary Diagnosis: Active Problems:   Schizophrenia ([place])  Long Term Goal(s):     Short  Term Goals:       Medication Management: Evaluate patient's response, side effects, and tolerance of medication regimen.  Therapeutic Interventions: 1 to 1 sessions, Unit Group sessions and Medication administration.  Evaluation of Outcomes: Progressing   RN Treatment Plan for Primary Diagnosis: <principal problem not specified> Long Term Goal(s): Knowledge of disease and therapeutic regimen to maintain health will improve  Short Term Goals: Ability to verbalize feelings will improve, Ability to identify and develop effective coping behaviors will improve and Compliance with prescribed medications will improve  Medication  Management: RN will administer medications as ordered by provider, will assess and evaluate patient's response and provide education to patient for prescribed medication. RN will report any adverse and/or side effects to prescribing provider.  Therapeutic Interventions: 1 on 1 counseling sessions, Psychoeducation, Medication administration, Evaluate responses to treatment, Monitor vital signs and CBGs as ordered, Perform/monitor CIWA, COWS, AIMS and Fall Risk screenings as ordered, Perform wound care treatments as ordered.  Evaluation of Outcomes: Progressing   LCSW Treatment Plan for Primary Diagnosis: <principal problem not specified> Long Term Goal(s): Safe transition to appropriate next level of care at discharge, Engage patient in therapeutic group addressing interpersonal concerns.  Short Term Goals: Facilitate acceptance of mental health diagnosis and concerns, Identify triggers associated with mental health/substance abuse issues and Increase skills for wellness and recovery  Therapeutic Interventions: Assess for all discharge needs, 1 to 1 time with Social worker, Explore available resources and support systems, Assess for adequacy in community support network, Educate family and significant other(s) on suicide prevention, Complete Psychosocial Assessment, Interpersonal group therapy.  Evaluation of Outcomes: Progressing   Progress in Treatment: Attending groups: No. Participating in groups: No. Taking medication as prescribed: Yes. Toleration medication: Yes. Family/Significant other contact made: Yes, individual(s) contacted:  Henry Smith Patients Guardian Patient understands diagnosis: Yes. Discussing patient identified problems/goals with staff: Yes. Medical problems stabilized or resolved: Yes. Denies suicidal/homicidal ideation: Yes. Issues/concerns per patient self-inventory: Yes. Other:   New problem(s) identified: No, Describe:  None  New Short Term/Long Term  Goal(s): "To go back where I came from and take my medications."  Discharge Plan or Barriers: To return back to assisted living and cooperate with medication management.  Reason for Continuation of Hospitalization: Medication stabilization  Estimated Length of Stay: 3-5 days  Recreational Therapy: Patient Stressors:  Family Patient Goal: Patient will participate actively in groups and activities x5 days.   Attendees: Patient: Henry Smith 12/29/2017 11:36 AM  Physician: Amador Cunas, MD 12/29/2017 11:36 AM  Nursing: Polly Cobia, RN 12/29/2017 11:36 AM  RN Care Manager: 12/29/2017 11:36 AM  Social Worker: Darin Engels, [place] 12/29/2017 11:36 AM  Recreational Therapist:  12/29/2017 11:36 AM  Other: Alden Hipp LCSW 12/29/2017 11:36 AM  Other:  12/29/2017 11:36 AM  Other: 12/29/2017 11:36 AM    Scribe for Treatment Team: Darin Engels, LCSW 12/29/2017 11:36 AM

## 2017-12-29 NOTE — Progress Notes (Signed)
D: Pt denies SI/HI/AVH. Pt is pleasant and cooperative. Pt very lethargic and could not stay awake during nursing assessment.   A: Pt was offered support and encouragement. Pt was given scheduled medications. Pt was encourage to attend groups. Q 15 minute checks were done for safety.   R:  Pt is taking medication. Pt has no complaints at this time .Pt receptive to treatment and safety maintained on unit.

## 2017-12-29 NOTE — BHH Counselor (Signed)
CSW spoke to patients guardian and he mentions that he patient is a resident at [facility] at [place] in [place] and they take care of all of his medications in [place]. He also says that his only concern was that the patient becomes stable on his medications.   CSW will email over consents for the guardian to sign.   Darin Engels, MSW, [place], LCASA 12/29/2017 10:37 AM

## 2017-12-29 NOTE — Plan of Care (Signed)
  Safety: Periods of time without injury will increase 12/29/2017 2101 - Progressing by [place] Crosby, RN Note Pt safe on the unit at this time
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
## 2017-12-29 NOTE — BHH Suicide Risk Assessment (Signed)
[facility] INPATIENT:  Family/Significant Other Suicide Prevention Education  Suicide Prevention Education:  Education Completed; Patients [place], [phone] has been identified by the patient as the family member/significant other with whom the patient will be residing, and identified as the person(s) who will aid the patient in the event of a mental health crisis (suicidal ideations/suicide attempt).  With written consent from the patient, the family member/significant other has been provided the following suicide prevention education, prior to the and/or following the discharge of the patient.  The suicide prevention education provided includes the following:  Suicide risk factors  Suicide prevention and interventions  National Suicide Hotline telephone number  [facility] assessment telephone number  [facility] Emergency Assistance [place] and/or Residential Mobile Crisis Unit telephone number  Request made of family/significant other to:  Remove weapons (e.g., guns, rifles, knives), all items previously/currently identified as safety concern.    Remove drugs/medications (over-the-counter, prescriptions, illicit drugs), all items previously/currently identified as a safety concern.  The family member/significant other verbalizes understanding of the suicide prevention education information provided.  The family member/significant other agrees to remove the items of safety concern listed above.  Guardian states that the patient is a resident at [facility] at [place]. He does not have any assess to any guns/weapons. He has been attending there for 3 months.  Darin Engels 12/29/2017, 11:39 AM

## 2017-12-29 NOTE — Tx Team (Signed)
Initial Treatment Plan 12/29/2017 4:57 AM [name] KRC:[id number]    PATIENT STRESSORS: Financial difficulties Health problems   PATIENT STRENGTHS: Motivation for treatment/growth Supportive family/friends   PATIENT IDENTIFIED PROBLEMS: Mood Instability  Dementia                   DISCHARGE CRITERIA:  Motivation to continue treatment in a less acute level of care Verbal commitment to aftercare and medication compliance  PRELIMINARY DISCHARGE PLAN: Outpatient therapy Return to previous living arrangement  PATIENT/FAMILY INVOLVEMENT: This treatment plan has been presented to and reviewed with the patient, Henry Smith.  The patient and family have been given the opportunity to ask questions and make suggestions.  Electa Sniff, RN 12/29/2017, 4:57 AM

## 2017-12-29 NOTE — BHH Group Notes (Signed)
12/29/2017 9:30AM  Type of Therapy/Topic:  Group Therapy:  Emotion Regulation  Participation Level:  Did Not Attend   Description of Group:   The purpose of this group is to assist patients in learning to regulate negative emotions and experience positive emotions. Patients will be guided to discuss ways in which they have been vulnerable to their negative emotions. These vulnerabilities will be juxtaposed with experiences of positive emotions or situations, and patients will be challenged to use positive emotions to combat negative ones. Special emphasis will be placed on coping with negative emotions in conflict situations, and patients will process healthy conflict resolution skills.  Therapeutic Goals: 1. Patient will identify two positive emotions or experiences to reflect on in order to balance out negative emotions 2. Patient will label two or more emotions that they find the most difficult to experience 3. Patient will demonstrate positive conflict resolution skills through discussion and/or role plays  Summary of Patient Progress: Patient was encouraged and invited to attend group. Patient did not attend group. Social worker will continue to encourage group participation in the future.       Therapeutic Modalities:   Cognitive Behavioral Therapy Feelings Identification Dialectical Behavioral Therapy   Darin Engels, [place] 12/29/2017 10:22 AM

## 2017-12-29 NOTE — BHH Suicide Risk Assessment (Signed)
[facility] Admission Suicide Risk Assessment   Nursing information obtained from:    Demographic factors:    Current Mental Status:   confused Loss Factors:   n/a Historical Factors:   history of schizoaffective disorder Risk Reduction Factors:   Guardian, lives in care facility  Total Time spent with patient: 1 hour Principal Problem: Schizoaffective disorder, bipolar type ([place]) Diagnosis:   Patient Active Problem List   Diagnosis Date Noted  . Schizoaffective disorder, bipolar type ([place]) [F25.0] 12/29/2017    Priority: High  . Cognitive impairment [R41.89] 12/29/2017    Priority: Medium  . Gait abnormality [R26.9] 05/06/2017  . Allergic drug rash [L27.0] 03/03/2017  . Altered mental status [R41.82] 02/26/2017  . Syphilis [A53.9] 01/31/2017  . Disorientation [R41.0]   . Generalized weakness [R53.1] 01/30/2017  . Altered mental status, unspecified [R41.82] 01/28/2017  . Hypercalcemia [E83.52] 11/01/2016  . Prostate cancer ([place]) [C61] 10/30/2016  . Leukopenia [D72.819] 10/30/2016  . Thrombocytopenia ([place]) [D69.6] 10/30/2016   Subjective Data: See H&P  Continued Clinical Symptoms:  Alcohol Use Disorder Identification Test Final Score (AUDIT): 1 The "Alcohol Use Disorders Identification Test", Guidelines for Use in Primary Care, Second Edition.  World Pharmacologist [facility]). Score between 0-7:  no or low risk or alcohol related problems. Score between 8-15:  moderate risk of alcohol related problems. Score between 16-19:  high risk of alcohol related problems. Score 20 or above:  warrants further diagnostic evaluation for alcohol dependence and treatment.   CLINICAL FACTORS:   Currently Psychotic     COGNITIVE FEATURES THAT CONTRIBUTE TO RISK:  Loss of executive function    SUICIDE RISK:   Minimal: No identifiable suicidal ideation.  Patients presenting with no risk factors but with morbid ruminations; may be classified as minimal risk based on the severity of the  depressive symptoms  PLAN OF CARE: See H&P  I certify that inpatient services furnished can reasonably be expected to improve the patient's condition.   Marylin Crosby, MD 12/29/2017, 3:32 PM

## 2017-12-30 MED ORDER — OLANZAPINE 5 MG PO TABS
5.0000 mg | ORAL_TABLET | Freq: Every day | ORAL | Status: DC
Start: 2017-12-30 — End: 2018-01-06
  Administered 2017-12-30 – 2018-01-05 (×7): 5 mg via ORAL
  Filled 2017-12-30 (×7): qty 1

## 2017-12-30 MED ORDER — OLANZAPINE 5 MG PO TABS
5.0000 mg | ORAL_TABLET | ORAL | Status: DC
  Administered 2017-12-31 – 2018-01-06 (×7): 5 mg via ORAL
  Filled 2017-12-30 (×7): qty 1

## 2017-12-30 NOTE — BHH Group Notes (Signed)
12/30/2017  Time: 1:00PM  Type of Therapy/Topic:  Group Therapy:  Balance in Life  Participation Level:  Did Not Attend  Description of Group:   This group will address the concept of balance and how it feels and looks when one is unbalanced. Patients will be encouraged to process areas in their lives that are out of balance and identify reasons for remaining unbalanced. Facilitators will guide patients in utilizing problem-solving interventions to address and correct the stressor making their life unbalanced. Understanding and applying boundaries will be explored and addressed for obtaining and maintaining a balanced life. Patients will be encouraged to explore ways to assertively make their unbalanced needs known to significant others in their lives, using other group members and facilitator for support and feedback.  Therapeutic Goals: 1. Patient will identify two or more emotions or situations they have that consume much of in their lives. 2. Patient will identify signs/triggers that life has become out of balance:  3. Patient will identify two ways to set boundaries in order to achieve balance in their lives:  4. Patient will demonstrate ability to communicate their needs through discussion and/or role plays  Summary of Patient Progress: Pt was invited to attend group but chose not to attend. CSW will continue to encourage pt to attend group throughout their admission.    Therapeutic Modalities:   Cognitive Behavioral Therapy Solution-Focused Therapy Assertiveness Training  Alden Hipp, MSW, LCSW 12/30/2017 1:39 PM

## 2017-12-30 NOTE — Progress Notes (Signed)
Recreation Therapy Notes  INPATIENT RECREATION THERAPY ASSESSMENT  Patient Details Name: Rudell Ortman MRN: [id number] DOB: 1941-01-18 Today's Date: 12/30/2017  Patient Stressors: Family  Coping Skills:   Exercise  Personal Challenges: Stress Management, Time Management, Trusting Others  Leisure Interests (2+):  Exercise - Walking  Awareness of Community Resources:  No  Community Resources:     Current Use:    If no, Barriers?:    Patient Strengths:  N/A  Patient Identified Areas of Improvement:  N/A  Current Recreation Participation:  Exercise  Patient Goal for Hospitalization:  N/A  [place] of Residence:  [place] of Residence:  N/A   Current SI (including self-harm):     Current HI:     Consent to Intern Participation:     Yecheskel Kurek 12/30/2017, 11:00 AM
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
## 2017-12-30 NOTE — Progress Notes (Signed)
D- Patient alert and oriented. Patient presents in a pleasant mood on assessment stating that he slept ok last night. Patient denies SI, HI, AVH, and pain at this time. Patient also denies any signs/symptoms of depression and anxiety at this time.  A- Scheduled medications administered to patient, per MD orders. Support and encouragement provided.  Routine safety checks conducted every 15 minutes.  Patient informed to notify staff with problems or concerns.  R- No adverse drug reactions noted. Patient contracts for safety at this time. Patient compliant with medications and treatment plan. Patient receptive, calm, and cooperative. Patient interacts well with others on the unit.  Patient remains safe at this time.

## 2017-12-30 NOTE — BHH Group Notes (Signed)
LCSW Group Therapy Note 12/30/2017 9:00 AM  Type of Therapy and Topic:  Group Therapy:  Setting Goals  Participation Level:  Did Not Attend  Description of Group: In this process group, patients discussed using strengths to work toward goals and address challenges.  Patients identified two positive things about themselves and one goal they were working on.  Patients were given the opportunity to share openly and support each other's plan for self-empowerment.  The group discussed the value of gratitude and were encouraged to have a daily reflection of positive characteristics or circumstances.  Patients were encouraged to identify a plan to utilize their strengths to work on current challenges and goals.  Therapeutic Goals 1. Patient will verbalize personal strengths/positive qualities and relate how these can assist with achieving desired personal goals 2. Patients will verbalize affirmation of peers plans for personal change and goal setting 3. Patients will explore the value of gratitude and positive focus as related to successful achievement of goals 4. Patients will verbalize a plan for regular reinforcement of personal positive qualities and circumstances.  Summary of Patient Progress:       Therapeutic Modalities Cognitive Behavioral Therapy Motivational Interviewing    Devona Konig, [place] 12/30/2017 2:44 PM
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
## 2017-12-30 NOTE — Progress Notes (Signed)
Recreation Therapy Notes  Date: 01.17.2019  Time: 9:30 am   Location: Craft Room  Behavioral response: N/A  Intervention Topic: Anger  Discussion/Intervention: Patient did not attend group. Clinical Observations/Feedback:  Patient did not attend group.  Shailene Demonbreun LRT/CTRS         Jaimey Franchini 12/30/2017 10:42 AM

## 2017-12-31 MED ORDER — HALOPERIDOL DECANOATE 100 MG/ML IM SOLN: 100.0000 mg | Freq: Once | INTRAMUSCULAR | Status: DC

## 2017-12-31 MED ORDER — HALOPERIDOL 5 MG PO TABS
5.0000 mg | ORAL_TABLET | Freq: Two times a day (BID) | ORAL | Status: DC
  Administered 2017-12-31 – 2018-01-06 (×12): 5 mg via ORAL
  Filled 2017-12-31 (×12): qty 1

## 2017-12-31 NOTE — Progress Notes (Signed)
D- Patient alert and oriented. Patient presents in a pleasant mood on assessment stating that he slept good last night. Patient denies SI, HI, AVH, and pain at this time. Patient also denies depression. Patient states that he is very anxious to go home. Patient states that "my leg is doing ok right now but I'm going to still use the chair".  A- Scheduled medications administered to patient, per MD orders. Support and encouragement provided.  Routine safety checks conducted every 15 minutes.  Patient informed to notify staff with problems or concerns.  R- No adverse drug reactions noted. Patient contracts for safety at this time. Patient compliant with medications and treatment plan. Patient receptive, calm, and cooperative. Patient interacts well with others on the unit.  Patient remains safe at this time.

## 2017-12-31 NOTE — BHH Group Notes (Signed)
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
## 2017-12-31 NOTE — BHH Counselor (Signed)
CSW spoke with Barbara Cower from the patients nursing home to inform her of his discharge date next week. She plans to come Monday January 01/03/18 at 1pm to assess the patient before he can go back to the group home.  Darin Engels, MSW, [place], LCASA 12/31/2017 2:20 PM

## 2017-12-31 NOTE — Progress Notes (Signed)
Patient ID: [place] Smith, male   DOB: May 08, 1941, 77 y.o.   MRN: [id number] Braden Scale Score is 18 from 22, relies on WC, using urinal and enuretic, bed linen changed after bathroom assist, rated 21 points from previous rate of14 on AFRA-Adult Fall Risk Assessment; patient has ongoing dementia and may not be appropriate patient for BMU as he may be decompensating. Pleasant old man, denied self harm or anyone else.

## 2017-12-31 NOTE — Progress Notes (Signed)
Recreation Therapy Notes  Date: 01.18.2019  Time: 9:30 am  Location: Craft Room  Behavioral response: N/A  Intervention Topic: Emotions  Discussion/Intervention: Patient did not attend group. Clinical Observations/Feedback:  Patient did not attend group.          [place] Smith 12/31/2017 12:51 PM
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
## 2017-12-31 NOTE — Plan of Care (Signed)
Patient slept for Estimated Hours of 5; Precautionary checks every 15 minutes for safety maintained, room free of safety hazards, patient sustains no injury or falls during this shift.

## 2017-12-31 NOTE — Plan of Care (Signed)
Patient has shown some interest in leisure activities by being observed out in the dayroom interacting well with other members on the unit. Patient states that he slept good last night and that his "leg is ok, but it goes out, so I'm going to still use the wheelchair". Patient verbalizes understanding of general information that has been provided to him without any further questions or concerns at this time. Patient denies SI/HI/AVH and depression at this time. Patient has demonstrated self-control on the unit and has been in compliance with his medication plan. Patient has been free from injury at this time and has maintained his clinical measurements within normal limits thus far. Patient has not been able to identify available resources to help assist him in meeting his health-care needs. Patient has not participated in any unit groups today, he has been lying down in his room. Patient is safe on the unit at this time.

## 2017-12-31 NOTE — Plan of Care (Signed)
  Progressing Coping: Ability to verbalize frustrations and anger appropriately will improve 12/31/2017 2332 - Progressing by Derek [place], RN

## 2018-01-01 NOTE — Plan of Care (Signed)
Patient is alert and oriented X 4. Patient denies SI, HI and AVH. Patient is pleasant and compliant with medications. Patient did not attend group today. Patient spent most of the day in his room asleep. Patient is able to walk and clean self independently but will try to get male staff to help him get cleaned up. Patient is compliant with medications. Nurse will encourage active participation in group meetings. Nurse will continue to monitor. Activity: Interest or engagement in activities will improve 01/01/2018 1151 - Not Progressing by Geraldo Docker, RN Sleeping patterns will improve 01/01/2018 1151 - Progressing by Geraldo Docker, RN   Education: Knowledge of [place] Education information/materials will improve 01/01/2018 1151 - Progressing by Geraldo Docker, RN Emotional status will improve 01/01/2018 1151 - Progressing by Geraldo Docker, RN Mental status will improve 01/01/2018 1151 - Progressing by Geraldo Docker, RN Verbalization of understanding the information provided will improve 01/01/2018 1151 - Progressing by Geraldo Docker, RN   Coping: Ability to verbalize frustrations and anger appropriately will improve 01/01/2018 1151 - Progressing by Geraldo Docker, RN Ability to demonstrate self-control will improve 01/01/2018 1151 - Progressing by Geraldo Docker, RN
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
## 2018-01-01 NOTE — BHH Group Notes (Signed)

## 2018-01-02 ENCOUNTER — Encounter: Payer: Self-pay | Admitting: Psychiatry

## 2018-01-02 DIAGNOSIS — F17200 Nicotine dependence, unspecified, uncomplicated: Secondary | ICD-10-CM | POA: Diagnosis present

## 2018-01-02 NOTE — Plan of Care (Signed)
Patient is alert and oriented, denies SI, HI and AVH. Patient compliant with medications. Affect is pleasant. Patient spends most of the day in his room, but comes out during meal times. Nurse will continue to monitor patient. Activity: Interest or engagement in activities will improve 01/02/2018 1607 - Progressing by Geraldo Docker, RN Sleeping patterns will improve 01/02/2018 1607 - Progressing by Geraldo Docker, RN   Education: Knowledge of [place] Education information/materials will improve 01/02/2018 1607 - Progressing by Geraldo Docker, RN Emotional status will improve 01/02/2018 1607 - Progressing by Geraldo Docker, RN Mental status will improve 01/02/2018 1607 - Progressing by Geraldo Docker, RN Verbalization of understanding the information provided will improve 01/02/2018 1607 - Progressing by Geraldo Docker, RN   Coping: Ability to verbalize frustrations and anger appropriately will improve 01/02/2018 1607 - Progressing by Geraldo Docker, RN Ability to demonstrate self-control will improve 01/02/2018 1607 - Progressing by Geraldo Docker, RN

## 2018-01-02 NOTE — Plan of Care (Signed)
Pt has been isolative to self. Pt is medication compliant. Pt denies AH/VH/SI/HI and pain at this time. Pt has urinal by bedside. No s/sx of distress. Remains on Q15 mins safety rounds. Will cont to monitor pt.  Progressing Activity: Interest or engagement in activities will improve 01/02/2018 0001 - Progressing by Corinna Capra, RN Sleeping patterns will improve 01/02/2018 0001 - Progressing by Corinna Capra, RN Education: Knowledge of [place] Education information/materials will improve 01/02/2018 0001 - Progressing by Corinna Capra, RN Emotional status will improve 01/02/2018 0001 - Progressing by Corinna Capra, RN Mental status will improve 01/02/2018 0001 - Progressing by Corinna Capra, RN Verbalization of understanding the information provided will improve 01/02/2018 0001 - Progressing by Corinna Capra, RN Coping: Ability to verbalize frustrations and anger appropriately will improve 01/02/2018 0001 - Progressing by Corinna Capra, RN Ability to demonstrate self-control will improve 01/02/2018 0001 - Progressing by Corinna Capra, RN Health Behavior/Discharge Planning: Identification of resources available to assist in meeting health care needs will improve 01/02/2018 0001 - Progressing by Corinna Capra, RN Compliance with treatment plan for underlying cause of condition will improve 01/02/2018 0001 - Progressing by Corinna Capra, RN Physical Regulation: Ability to maintain clinical measurements within normal limits will improve 01/02/2018 0001 - Progressing by Corinna Capra, RN Safety: Periods of time without injury will increase 01/02/2018 0001 - Progressing by Corinna Capra, RN [facility] Participation in Recreation Therapeutic Interventions STG-Other Recreation Therapy Goal (Specify) Description Patient will participate actively in groups and activities x5 days.  01/02/2018 0001 - Progressing by Corinna Capra, RN Safety: Ability to remain free from  injury will improve 01/02/2018 0001 - Progressing by Corinna Capra, RN
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
## 2018-01-02 NOTE — BHH Group Notes (Signed)
LCSW Group Therapy Note 01/02/2018 1:15pm  Type of Therapy and Topic: Group Therapy: Feelings Around Returning Home & Establishing a Supportive Framework and Supporting Oneself When Supports Not Available  Participation Level: Did Not Attend  Description of Group:  Patients first processed thoughts and feelings about upcoming discharge. These included fears of upcoming changes, lack of change, new living environments, judgements and expectations from others and overall stigma of mental health issues. The group then discussed the definition of a supportive framework, what that looks and feels like, and how do to discern it from an unhealthy non-supportive network. The group identified different types of supports as well as what to do when your family/friends are less than helpful or unavailable  Therapeutic Goals  1. Patient will identify one healthy supportive network that they can use at discharge. 2. Patient will identify one factor of a supportive framework and how to tell it from an unhealthy network. 3. Patient able to identify one coping skill to use when they do not have positive supports from others. 4. Patient will demonstrate ability to communicate their needs through discussion and/or role plays.  Summary of Patient Progress:   Therapeutic Modalities Cognitive Behavioral Therapy Motivational Interviewing   Henry Smith  CUEBAS-COLON, LCSW 01/02/2018 11:13 AM

## 2018-01-03 MED ORDER — HALOPERIDOL DECANOATE 100 MG/ML IM SOLN
100.0000 mg | Freq: Once | INTRAMUSCULAR | Status: AC
  Administered 2018-01-03: 100 mg via INTRAMUSCULAR
  Filled 2018-01-03: qty 1

## 2018-01-03 MED ORDER — HALOPERIDOL DECANOATE 100 MG/ML IM SOLN: 100.0000 mg | INTRAMUSCULAR | Status: DC

## 2018-01-03 NOTE — Progress Notes (Addendum)
Patient found in bed awake upon my arrival. Patient was isolative most of the evening. Visible only for snack. Patient is neither visible nor social. Gait is steady. Compliant with HS medications and staff direction. Patient is discharge focused. "I want to go home." Encouraged patient to continue to improve and be compliant with therapeutic regimen in order to be discharged. Verbalized understanding and agreement. Denies SI, HI, AVH, depression, and anxiety. Reports eating, sleeping, and voiding adequately. Q 15 minute checks maintained. Will continue to monitor throughout the shift. Patient slept through the night. Compliant with am vitals and medication. Continues to be discharge focused. Upon waking asks, "Am I going home today?" Will endorse care to oncoming shift.

## 2018-01-03 NOTE — Tx Team (Signed)
Interdisciplinary Treatment and Diagnostic Plan Update  01/03/2018 Time of Session: 11am Henry Smith MRN: [id number]  Principal Diagnosis: Schizoaffective disorder, bipolar type [facility])  Secondary Diagnoses: Principal Problem:   Schizoaffective disorder, bipolar type ([place]) Active Problems:   Cognitive impairment   Tobacco use disorder   Current Medications:  Current Facility-Administered Medications  Medication Dose Route Frequency Provider Last Rate Last Dose  . acetaminophen (TYLENOL) tablet 650 mg  650 mg Oral Q6H PRN Clapacs, John T, MD      . alum & mag hydroxide-simeth (MAALOX/MYLANTA) 200-200-20 MG/5ML suspension 30 mL  30 mL Oral Q4H PRN Clapacs, John T, MD      . amantadine (SYMMETREL) capsule 100 mg  100 mg Oral BID Clapacs, Madie Reno, MD   100 mg at 01/03/18 1027  . divalproex (DEPAKOTE ER) 24 hr tablet 1,250 mg  1,250 mg Oral QHS Clapacs, John T, MD   1,250 mg at 01/02/18 2132  . haloperidol (HALDOL) tablet 5 mg  5 mg Oral BID McNew, Tyson Babinski, MD   5 mg at 01/03/18 2536  . haloperidol decanoate (HALDOL DECANOATE) 100 MG/ML injection 100 mg  100 mg Intramuscular Once Pucilowska, Jolanta B, MD      . hydrOXYzine (ATARAX/VISTARIL) tablet 50 mg  50 mg Oral TID PRN Clapacs, Madie Reno, MD   50 mg at 01/02/18 2132  . losartan (COZAAR) tablet 25 mg  25 mg Oral Daily Clapacs, Madie Reno, MD   25 mg at 01/03/18 [id number]  . magnesium hydroxide (MILK OF MAGNESIA) suspension 30 mL  30 mL Oral Daily PRN Clapacs, John T, MD      . OLANZapine (ZYPREXA) tablet 5 mg  5 mg Oral QHS McNew, Tyson Babinski, MD   5 mg at 01/02/18 2132   And  . OLANZapine (ZYPREXA) tablet 5 mg  5 mg Oral BH-q7a McNew, Tyson Babinski, MD   5 mg at 01/03/18 3474   PTA Medications: Medications Prior to Admission  Medication Sig Dispense Refill Last Dose  . amantadine (SYMMETREL) 100 MG capsule Take 100 mg 2 (two) times daily by mouth.    10/27/2017 at 0800  . diphenhydrAMINE (BENADRYL) 25 mg capsule Take 1 capsule (25 mg total) by mouth  every 8 (eight) hours as needed for itching (rash). 20 capsule 0 prn at prn  . divalproex (DEPAKOTE ER) 500 MG 24 hr tablet Take 1,000 mg by mouth at bedtime.   10/27/2017 at 0800  . divalproex (DEPAKOTE) 250 MG DR tablet 250 mg at bedtime.   10/27/2017 at 0800  . gabapentin (NEURONTIN) 300 MG capsule Take 300 mg by mouth 3 (three) times daily.   10/27/2017 at Unknown time  . latanoprost (XALATAN) 0.005 % ophthalmic solution Place 1 drop into both eyes at bedtime.   10/27/2017 at 0800  . losartan (COZAAR) 25 MG tablet Take 25 mg by mouth daily.   10/27/2017 at 0800  . Melatonin 5 MG TABS Take 5 mg by mouth at bedtime.   10/26/2017 at 2000  . OLANZapine (ZYPREXA) 10 MG tablet Take 10 mg by mouth 2 (two) times daily.   10/27/2017 at 0800  . triamcinolone ointment (KENALOG) 0.1 % Apply 1 application topically 2 (two) times daily as needed.   prn at prn    Patient Stressors: Financial difficulties Health problems  Patient Strengths: Motivation for treatment/growth Supportive family/friends  Treatment Modalities: Medication Management, Group therapy, Case management,  1 to 1 session with clinician, Psychoeducation, Recreational therapy.   Physician Treatment Plan for Primary  Diagnosis: Schizoaffective disorder, bipolar type ([place]) Long Term Goal(s): Improvement in symptoms so as ready for discharge   Short Term Goals: Ability to demonstrate self-control will improve  Medication Management: Evaluate patient's response, side effects, and tolerance of medication regimen.  Therapeutic Interventions: 1 to 1 sessions, Unit Group sessions and Medication administration.  Evaluation of Outcomes: Progressing  Physician Treatment Plan for Secondary Diagnosis: Principal Problem:   Schizoaffective disorder, bipolar type ([place]) Active Problems:   Cognitive impairment   Tobacco use disorder  Long Term Goal(s): Improvement in symptoms so as ready for discharge   Short Term Goals: Ability to  demonstrate self-control will improve     Medication Management: Evaluate patient's response, side effects, and tolerance of medication regimen.  Therapeutic Interventions: 1 to 1 sessions, Unit Group sessions and Medication administration.  Evaluation of Outcomes: Progressing   RN Treatment Plan for Primary Diagnosis: Schizoaffective disorder, bipolar type ([place]) Long Term Goal(s): Knowledge of disease and therapeutic regimen to maintain health will improve  Short Term Goals: Ability to verbalize feelings will improve, Ability to identify and develop effective coping behaviors will improve and Compliance with prescribed medications will improve  Medication Management: RN will administer medications as ordered by provider, will assess and evaluate patient's response and provide education to patient for prescribed medication. RN will report any adverse and/or side effects to prescribing provider.  Therapeutic Interventions: 1 on 1 counseling sessions, Psychoeducation, Medication administration, Evaluate responses to treatment, Monitor vital signs and CBGs as ordered, Perform/monitor CIWA, COWS, AIMS and Fall Risk screenings as ordered, Perform wound care treatments as ordered.  Evaluation of Outcomes: Progressing   LCSW Treatment Plan for Primary Diagnosis: Schizoaffective disorder, bipolar type ([place]) Long Term Goal(s): Safe transition to appropriate next level of care at discharge, Engage patient in therapeutic group addressing interpersonal concerns.  Short Term Goals: Facilitate acceptance of mental health diagnosis and concerns, Identify triggers associated with mental health/substance abuse issues and Increase skills for wellness and recovery  Therapeutic Interventions: Assess for all discharge needs, 1 to 1 time with Social worker, Explore available resources and support systems, Assess for adequacy in community support network, Educate family and significant other(s) on suicide  prevention, Complete Psychosocial Assessment, Interpersonal group therapy.  Evaluation of Outcomes: Progressing   Progress in Treatment: Attending groups: No. Participating in groups: No. Taking medication as prescribed: Yes. Toleration medication: Yes. Family/Significant other contact made: Yes, individual(s) contacted:  [name] Patients Guardian Patient understands diagnosis: Yes. Discussing patient identified problems/goals with staff: Yes. Medical problems stabilized or resolved: Yes. Denies suicidal/homicidal ideation: Yes. Issues/concerns per patient self-inventory: Yes. Other:   New problem(s) identified: No, Describe:  None  New Short Term/Long Term Goal(s): "To go back where I came from and take my medications."  Discharge Plan or Barriers: To return back to assisted living and cooperate with medication management.  Reason for Continuation of Hospitalization: Medication stabilization  Estimated Length of Stay: 1-2 days  Recreational Therapy: Patient Stressors:  Family Patient Goal: Patient will participate actively in groups and activities x5 days.   Attendees: Patient: Henry Smith 01/03/2018 10:45 AM  Physician: Amador Cunas, MD 01/03/2018 10:45 AM  Nursing: Polly Cobia, RN 01/03/2018 10:45 AM  RN Care Manager: 01/03/2018 10:45 AM  Social Worker: Darin Engels, [place] 01/03/2018 10:45 AM  Recreational Therapist:  01/03/2018 10:45 AM  Other: Alden Hipp LCSW 01/03/2018 10:45 AM  Other:  01/03/2018 10:45 AM  Other: 01/03/2018 10:45 AM    Scribe for Treatment Team: Darin Engels, LCSW 01/03/2018  10:45 AM

## 2018-01-03 NOTE — BHH Counselor (Signed)
CSW spoke with Joaquim Nam from [facility] regarding the patients discharge. Mrs. Henry Smith has concerns regarding the patients medications Zyprexa and Haldol (pill) in particular. She wants the doctor to give her call to provide more insight on the tapering directions at discharge for these medications.   CSW will relay the information to the doctor.    Darin Engels, MSW, [place], LCASA 01/03/2018 1:51 PM

## 2018-01-03 NOTE — BHH Group Notes (Signed)
[facility] Group Notes:  (Nursing/MHT/Case Management/Adjunct)  Date:  01/03/2018  Time:  3:06 PM  Type of Therapy:  Psychoeducational Skills  Participation Level:  Did Not Attend  Adela Lank [facility] 01/03/2018, 3:06 PM

## 2018-01-03 NOTE — Plan of Care (Signed)
  Progressing Activity: Sleeping patterns will improve 01/03/2018 2250 - Progressing by Derek Mound, RN Education: Emotional status will improve 01/03/2018 2250 - Progressing by Derek Mound, RN Mental status will improve 01/03/2018 2250 - Progressing by Derek Mound, RN Verbalization of understanding the information provided will improve 01/03/2018 2250 - Progressing by Derek Mound, RN Coping: Ability to verbalize frustrations and anger appropriately will improve 01/03/2018 2250 - Progressing by Derek Mound, RN Ability to demonstrate self-control will improve 01/03/2018 2250 - Progressing by Derek Mound, RN Safety: Ability to remain free from injury will improve 01/03/2018 2250 - Progressing by Derek Mound, RN

## 2018-01-03 NOTE — Plan of Care (Signed)
  Progressing Activity: Interest or engagement in activities will improve 01/03/2018 0143 - Progressing by Corinna Capra, RN Sleeping patterns will improve 01/03/2018 0143 - Progressing by Corinna Capra, RN Education: Knowledge of [place] Education information/materials will improve 01/03/2018 0143 - Progressing by Corinna Capra, RN Emotional status will improve 01/03/2018 0143 - Progressing by Corinna Capra, RN Mental status will improve 01/03/2018 0143 - Progressing by Corinna Capra, RN Verbalization of understanding the information provided will improve 01/03/2018 0143 - Progressing by Corinna Capra, RN Coping: Ability to verbalize frustrations and anger appropriately will improve 01/03/2018 0143 - Progressing by Corinna Capra, RN Ability to demonstrate self-control will improve 01/03/2018 0143 - Progressing by Corinna Capra, RN Health Behavior/Discharge Planning: Identification of resources available to assist in meeting health care needs will improve 01/03/2018 0143 - Progressing by Corinna Capra, RN Compliance with treatment plan for underlying cause of condition will improve 01/03/2018 0143 - Progressing by Corinna Capra, RN Physical Regulation: Ability to maintain clinical measurements within normal limits will improve 01/03/2018 0143 - Progressing by Corinna Capra, RN Safety: Periods of time without injury will increase 01/03/2018 0143 - Progressing by Corinna Capra, RN  [facility] Participation in Recreation Therapeutic Interventions STG-Other Recreation Therapy Goal (Specify) Description Patient will participate actively in groups and activities x5 days.  01/03/2018 0143 - Progressing by Corinna Capra, RN Safety: Ability to remain free from injury will improve 01/03/2018 0143 - Progressing by Corinna Capra, RN

## 2018-01-03 NOTE — Progress Notes (Signed)
Recreation Therapy Notes  Date: 01.21.2019  Time: 9:30 am  Location: Craft Room  Behavioral response: N/A  Intervention Topic: Goals  Discussion/Intervention: Patient did not attend group. Clinical Observations/Feedback:  Patient did not attend group. Lazlo Tunney LRT/CTRS         Granvel Proudfoot 01/03/2018 10:31 AM

## 2018-01-03 NOTE — BHH Group Notes (Signed)
01/03/2018  Time: 1:00PM   Type of Therapy and Topic:  Group Therapy:  Overcoming Obstacles   Participation Level:  Did Not Attend   Description of Group:   In this group patients will be encouraged to explore what they see as obstacles to their own wellness and recovery. They will be guided to discuss their thoughts, feelings, and behaviors related to these obstacles. The group will process together ways to cope with barriers, with attention given to specific choices patients can make. Each patient will be challenged to identify changes they are motivated to make in order to overcome their obstacles. This group will be process-oriented, with patients participating in exploration of their own experiences, giving and receiving support, and processing challenge from other group members.   Therapeutic Goals: 1. Patient will identify personal and current obstacles as they relate to admission. 2. Patient will identify barriers that currently interfere with their wellness or overcoming obstacles.  3. Patient will identify feelings, thought process and behaviors related to these barriers. 4. Patient will identify two changes they are willing to make to overcome these obstacles:      Summary of Patient Progress  Pt was invited to attend group but chose not to attend. CSW will continue to encourage pt to attend group throughout their admission.     Therapeutic Modalities:   Cognitive Behavioral Therapy Solution Focused Therapy Motivational Interviewing Relapse Prevention Therapy  Alden Hipp, MSW, LCSW 01/03/2018 1:47 PM
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
## 2018-01-03 NOTE — Plan of Care (Signed)
  Progressing Pt alert and oriented to self and place "hospital" when assessed. Denies SI, HI, AVH and pain at this time. Pt presents guarded but forwards on interaction. Out of bed for medications and meals and back to bed. Ambulatory with a steady gait. Fall precaution maintained without incident to note thus far. All medications administered as ordered including Haldol Dec. 100 mg /IM with verbal education and effects monitored. Pt encouraged to attend to ADLs, voice concerns and comply with treatment regimen as ordered. Safety checks maintained at Q 15 minutes intervals without outburst or self harm gestures. POC continues for safety and mood stability.   Activity: Interest or engagement in activities will improve 01/03/2018 1454 - Progressing by [place] Police, RN Sleeping patterns will improve 01/03/2018 1454 - Progressing by [place] Police, RN Education: Knowledge of [place] Education information/materials will improve 01/03/2018 1454 - Progressing by [place] Police, RN Emotional status will improve 01/03/2018 1454 - Progressing by [place] Police, RN Mental status will improve 01/03/2018 1454 - Progressing by [place] [employer], RN Verbalization of understanding the information provided will improve 01/03/2018 1454 - Progressing by [place] [employer], RN Coping: Ability to verbalize frustrations and anger appropriately will improve 01/03/2018 1454 - Progressing by [place] Police, RN Ability to demonstrate self-control will improve 01/03/2018 1454 - Progressing by [place] Police, RN Health Behavior/Discharge Planning: Identification of resources available to assist in meeting health care needs will improve 01/03/2018 1454 - Progressing by [place] [employer], RN Compliance with treatment plan for underlying cause of condition will improve 01/03/2018 1454 - Progressing by [place] [employer], RN Physical Regulation: Ability to maintain clinical measurements within normal limits  will improve 01/03/2018 1454 - Progressing by [place] Police, RN Safety: Periods of time without injury will increase 01/03/2018 1454 - Progressing by [place] Police, RN [facility] Participation in Recreation Therapeutic Interventions STG-Other Recreation Therapy Goal (Specify) Description Patient will participate actively in groups and activities x5 days.  01/03/2018 1454 - Progressing by [place] [employer], RN Safety: Ability to remain free from injury will improve 01/03/2018 1454 - Progressing by [place] [employer], RN

## 2018-01-04 MED ORDER — AMANTADINE HCL 100 MG PO CAPS: 100.0000 mg | ORAL_CAPSULE | Freq: Two times a day (BID) | ORAL | 0 refills | Status: AC

## 2018-01-04 MED ORDER — OLANZAPINE 5 MG PO TABS: 5.0000 mg | ORAL_TABLET | ORAL | 0 refills | Status: DC

## 2018-01-04 MED ORDER — HALOPERIDOL 5 MG PO TABS: 5.0000 mg | ORAL_TABLET | Freq: Two times a day (BID) | ORAL | 0 refills | Status: DC

## 2018-01-04 MED ORDER — DIVALPROEX SODIUM ER 250 MG PO TB24
250.0000 mg | ORAL_TABLET | Freq: Every day | ORAL | 0 refills | Status: DC
Start: 2018-01-04 — End: 2018-04-23

## 2018-01-04 MED ORDER — HALOPERIDOL DECANOATE 100 MG/ML IM SOLN: 100.0000 mg | INTRAMUSCULAR | 0 refills | Status: DC

## 2018-01-04 MED ORDER — DIVALPROEX SODIUM ER 500 MG PO TB24: 1000.0000 mg | ORAL_TABLET | Freq: Every day | ORAL | 0 refills | Status: DC

## 2018-01-04 MED ORDER — DIVALPROEX SODIUM 250 MG PO DR TAB: 250.0000 mg | DELAYED_RELEASE_TABLET | Freq: Every day | ORAL | 0 refills | Status: DC

## 2018-01-04 NOTE — Progress Notes (Signed)
Recreation Therapy Notes  Date: 01.22.2019  Time: 9:30 am  Location: Craft Room  Behavioral response: N/A   Intervention Topic: Creative Expressions  Discussion/Intervention: Patient did not attend group. Clinical Observations/Feedback:  Patient did not attend group.  Camaya Gannett LRT/CTRS          Donnita Farina 01/04/2018 10:41 AM

## 2018-01-04 NOTE — BHH Group Notes (Signed)
01/04/2018 1PM  Type of Therapy/Topic:  Group Therapy:  Feelings about Diagnosis  Participation Level:  Active   Description of Group:   This group will allow patients to explore their thoughts and feelings about diagnoses they have received. Patients will be guided to explore their level of understanding and acceptance of these diagnoses. Facilitator will encourage patients to process their thoughts and feelings about the reactions of others to their diagnosis and will guide patients in identifying ways to discuss their diagnosis with significant others in their lives. This group will be process-oriented, with patients participating in exploration of their own experiences, giving and receiving support, and processing challenge from other group members.   Therapeutic Goals: 1. Patient will demonstrate understanding of diagnosis as evidenced by identifying two or more symptoms of the disorder 2. Patient will be able to express two feelings regarding the diagnosis 3. Patient will demonstrate their ability to communicate their needs through discussion and/or role play  Summary of Patient Progress: Actively and appropriately engaged in the group. Patient was able to provide support and validation to other group members.Patient practiced active listening when interacting with the facilitator and other group members Patient in still in the process of obtaining treatment goals. When asked about his feelings related to his diagnosis Adonis says "I was understanding at first and now I'm managing."       Therapeutic Modalities:   Cognitive Behavioral Therapy Brief Therapy Feelings Identification    Darin Engels, LCSW 01/04/2018 1:48 PM

## 2018-01-04 NOTE — Plan of Care (Signed)
No group participation  , Isolated to room . Staff redirect patient   on  educations  No anger outburst. Able to display self control Appetite good  No periods of injury  Progressing Activity: Interest or engagement in activities will improve 01/04/2018 1907 - Progressing by Leodis Liverpool, RN Sleeping patterns will improve 01/04/2018 1907 - Progressing by Leodis Liverpool, RN Education: Knowledge of [place] Education information/materials will improve 01/04/2018 1907 - Progressing by Leodis Liverpool, RN Emotional status will improve 01/04/2018 1907 - Progressing by Leodis Liverpool, RN Mental status will improve 01/04/2018 1907 - Progressing by Leodis Liverpool, RN Verbalization of understanding the information provided will improve 01/04/2018 1907 - Progressing by Leodis Liverpool, RN Coping: Ability to verbalize frustrations and anger appropriately will improve 01/04/2018 1907 - Progressing by Leodis Liverpool, RN Ability to demonstrate self-control will improve 01/04/2018 1907 - Progressing by Leodis Liverpool, RN Health Behavior/Discharge Planning: Identification of resources available to assist in meeting health care needs will improve 01/04/2018 1907 - Progressing by Leodis Liverpool, RN Compliance with treatment plan for underlying cause of condition will improve 01/04/2018 1907 - Progressing by Leodis Liverpool, RN Physical Regulation: Ability to maintain clinical measurements within normal limits will improve 01/04/2018 1907 - Progressing by Leodis Liverpool, RN Safety: Periods of time without injury will increase 01/04/2018 1907 - Progressing by Leodis Liverpool, RN [facility] Participation in Recreation Therapeutic Interventions STG-Other Recreation Therapy Goal (Specify) Description Patient will participate actively in groups and activities x5 days.  01/04/2018 1907 - Progressing by Leodis Liverpool, RN Coping: Ability to cope will improve 01/04/2018 1907 - Progressing by Leodis Liverpool, RN Ability to verbalize feelings will improve 01/04/2018 1907 - Progressing by Leodis Liverpool, RN

## 2018-01-04 NOTE — Progress Notes (Signed)

## 2018-01-04 NOTE — BHH Counselor (Signed)
CSW called the guardian Henry Smith to discuss discharge date and plans. CSW a voicemail for him to return the call.   Darin Engels, MSW, [place], LCASA 01/04/2018 10:39 AM

## 2018-01-04 NOTE — Plan of Care (Signed)
Patient denies SI/HI, patient's safety is maintained on it. Patient has no complaints at this time.    Progressing Education: Knowledge of [place] Education information/materials will improve 01/04/2018 2055 - Progressing by Alyson Locket I, RN Mental status will improve 01/04/2018 2055 - Progressing by Alyson Locket I, RN Safety: Periods of time without injury will increase 01/04/2018 2055 - Progressing by Anson [place], RN

## 2018-01-04 NOTE — BHH Group Notes (Signed)
[facility] Group Notes:  (Nursing/MHT/Case Management/Adjunct)  Date:  01/04/2018  Time:  9:56 PM  Type of Therapy:  Group Therapy  Participation Level:  Did Not Attend  Participation Quality Modes of Intervention  Nehemiah Settle 01/04/2018, 9:56 PM

## 2018-01-04 NOTE — Discharge Instructions (Signed)
Next Haldol Decanoate injection 100 mg due on 01/31/18  Continue oral Haldol 5 mg BID for 4 weeks then stop.  You will be tapering off Zyprexa-Continue Zyprexa 5 mg twice a day (5 mg in the morning and 5 mg at bedtime) for 2 weeks, then decrease to 5 mg at bedtime for 2 weeks, then stop.
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
## 2018-01-05 NOTE — Progress Notes (Signed)
Patient is discharged to Assisted living at [facility].Report given to Katherine Basset at [phone].Patient will be transporting tomorrow by 9 AM in the non emergency EMS [phone]].Patient is calm and cooperative.Denies SI,HI and AVH.Patient found difficulty getting out of bed.Patient verbalized comfortable walking with walker.Appropriate with staff & peers.

## 2018-01-05 NOTE — Progress Notes (Signed)
Recreation Therapy Notes  Date: 01.23.2019  Time: 9:30 am  Location: Craft Room  Behavioral response: None  Intervention Topic: Team work  Discussion/Intervention: Patient did not attend group. Clinical Observations/Feedback:  Patient did not attend group.   Raquelle Pietro LRT/CTRS         Sampson Self 01/05/2018 12:33 PM

## 2018-01-05 NOTE — Progress Notes (Signed)
D: Patient denies SI/HI/AVH. Patient verbally contracts for safety. Patient is calm, cooperative and pleasant. Patient is states being prepared for discharge. Patient has no complaints at this time.  A: Patient was assessed by this nurse. Patient was oriented to unit. Patient's safety was maintained on unit. Q x 15 minute observation checks were completed for safety. Patient care plan was reviewed. Patient was offered support and encouragement. Patient was encourage to attend groups, participate in unit activities and continue with plan of care.   R: Patient has no complaints of pain at this time. Patient is receptive to treatment and safety maintained on unit.

## 2018-01-05 NOTE — BHH Suicide Risk Assessment (Signed)
[facility] Discharge Suicide Risk Assessment   Principal Problem: Schizoaffective disorder, bipolar type [facility]) Discharge Diagnoses:  Patient Active Problem List   Diagnosis Date Noted  . Schizoaffective disorder, bipolar type ([place]) [F25.0] 12/29/2017    Priority: High  . Cognitive impairment [R41.89] 12/29/2017    Priority: Medium  . Tobacco use disorder [F17.200] 01/02/2018  . Gait abnormality [R26.9] 05/06/2017  . Allergic drug rash [L27.0] 03/03/2017  . Altered mental status [R41.82] 02/26/2017  . Syphilis [A53.9] 01/31/2017  . Disorientation [R41.0]   . Generalized weakness [R53.1] 01/30/2017  . Altered mental status, unspecified [R41.82] 01/28/2017  . Hypercalcemia [E83.52] 11/01/2016  . Prostate cancer ([place]) [C61] 10/30/2016  . Leukopenia [D72.819] 10/30/2016  . Thrombocytopenia ([place]) [D69.6] 10/30/2016    Mental Status Per Nursing Assessment::   On Admission:     Demographic Factors:  Male and Age 75 or older  Loss Factors: Decline in physical health  Historical Factors: NA  Risk Reduction Factors:   Living with another person, especially a relative and Positive social support  Continued Clinical Symptoms:  Cognitive impairment  Cognitive Features That Contribute To Risk:  None    Suicide Risk:  Minimal: No identifiable suicidal ideation.  Patients presenting with no risk factors but with morbid ruminations; may be classified as minimal risk based on the severity of the depressive symptoms  Follow-up Information    [facility] Follow up.   Specialty:  [place] Why:  Please return back to your placement at discharge. Thank you Fax number is [phone]. Contact information: [place] [phone]          Plan Of Care/Follow-up recommendations: See above  Marylin Crosby, MD 01/05/2018, 11:23 AM

## 2018-01-05 NOTE — Progress Notes (Signed)
Recreation Therapy Notes  Date: 01.23 .2018  Time: 3:00pm  Location: Craft room  Behavioral response: N/A  Group Type: Craft  Participation level: N/A  Communication: Patient did not attend group.   Comments: N/A  Philbert Ocallaghan LRT/CTRS        Henry Smith 01/05/2018 4:53 PM 

## 2018-01-05 NOTE — BHH Group Notes (Signed)
  01/05/2018  Time: 1:00PM  Type of Therapy/Topic:  Group Therapy:  Emotion Regulation  Participation Level:  None   Description of Group:    The purpose of this group is to assist patients in learning to regulate negative emotions and experience positive emotions. Patients will be guided to discuss ways in which they have been vulnerable to their negative emotions. These vulnerabilities will be juxtaposed with experiences of positive emotions or situations, and patients will be challenged to use positive emotions to combat negative ones. Special emphasis will be placed on coping with negative emotions in conflict situations, and patients will process healthy conflict resolution skills.  Therapeutic Goals: 1. Patient will identify two positive emotions or experiences to reflect on in order to balance out negative emotions 2. Patient will label two or more emotions that they find the most difficult to experience 3. Patient will demonstrate positive conflict resolution skills through discussion and/or role plays  Summary of Patient Progress: Pt came to group for five minutes but did not participate unless directly prompted by CSW. Pt reported he is feeling, "just fine," today but did not elaborate on this information. Pt reported one way he can stay calm in a situation is to, "remind myself that it won't last forever." Pt then left group without offering an explanation or returning to group.   Therapeutic Modalities:   Cognitive Behavioral Therapy Feelings Identification Dialectical Behavioral Therapy  Alden Hipp, MSW, LCSW 01/05/2018 1:47 PM

## 2018-01-05 NOTE — Progress Notes (Signed)
  [facility] Adult Case Management Discharge Plan :  Will you be returning to the same living situation after discharge:  Yes,  [facility] [place] At discharge, do you have transportation home?: Yes,  [facility] Do you have the ability to pay for your medications: Yes,  Medicare  Release of information consent forms completed and in the chart;  Patient's signature needed at discharge.  Patient to Follow up at: Follow-up Information    [facility] Follow up.   Specialty:  [place] Why:  Please return back to your placement at discharge. Thank you Fax number is [phone]. Contact information: [address] [place] [phone]          Next level of care provider has access to [place] and Suicide Prevention discussed: Yes,  Patients Guardian [name], [phone]  Have you used any form of tobacco in the last 30 days? (Cigarettes, Smokeless Tobacco, Cigars, and/or Pipes): Yes  Has patient been referred to the Quitline?: Patient refused referral  Patient has been referred for addiction treatment: N/A  Darin Engels, LCSW 01/05/2018, 11:29 AM
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
## 2018-01-05 NOTE — BHH Counselor (Signed)
CSW spoke with guardian about discharge plan today to transport the patient back to [facility] at [place] via [place].   CSW spoke with Tammy at [facility] [place] about discharge plan today to transport the patient back to [facility] [place] via [place].    Darin Engels, MSW, [place], LCASA 01/05/2018 11:41 AM

## 2018-01-05 NOTE — BHH Counselor (Signed)
CSW faxed discharge summary to [facility] [place] per request.   Darin Engels, MSW, [place], LCASA 01/05/2018 11:48 AM'

## 2018-01-06 NOTE — Progress Notes (Signed)
Recreation Therapy Notes  INPATIENT RECREATION TR PLAN  Patient Details Name: Henry Smith MRN: [id number] DOB: [birth year] Today's Date: 01/06/2018  Rec Therapy Plan Is patient appropriate for Therapeutic Recreation?: Yes Treatment times per week: at least 3 Estimated Length of Stay: 5-7 days TR Treatment/Interventions: Group participation (Comment)  Discharge Criteria Pt will be discharged from therapy if:: Discharged Treatment plan/goals/alternatives discussed and agreed upon by:: Patient/family  Discharge Summary Short term goals set: Patient will participate actively in groups and activities x5 days.  Short term goals met: Not met Progress toward goals comments: (None) Reason goals not met: Patient did not attend any groups. Therapeutic equipment acquired: N/A Reason patient discharged from therapy: Discharge from hospital Pt/family agrees with progress & goals achieved: Yes Date patient discharged from therapy: 01/06/18   Ronnae Kaser 01/06/2018, 10:28 AM

## 2018-01-06 NOTE — Progress Notes (Signed)
Recreation Therapy Notes   Date: 01.24.2019  Time: 9:30 am  Location: [facility]  Behavioral response: N/A  Intervention Topic: Problem Solving  Discussion/Intervention: Patient did not attend group. Clinical Observations/Feedback:  Patient did not attend group.  Cortland Crehan LRT/CTRS          Suhailah Kwan 01/06/2018 10:22 AM

## 2018-01-06 NOTE — Progress Notes (Signed)
Patient is discharged to Assisted living at [facility].Report given to Katherine Basset at [phone] by nurse Gigi on 01/05/18. Patient transported by the non emergency EMS [phone]] this mornuing. Patient is calm and cooperative.Denies SI,HI and AVH.Patient found difficulty getting out of bed. Patient verbalized comfortable walking with walker.Appropriate with staff & peers.

## 2018-04-08 ENCOUNTER — Encounter: Payer: Self-pay | Admitting: Urology

## 2018-04-08 ENCOUNTER — Ambulatory Visit: Payer: Self-pay | Admitting: Urology

## 2018-04-23 ENCOUNTER — Emergency Department (HOSPITAL_COMMUNITY): Payer: Medicare Other

## 2018-04-23 ENCOUNTER — Other Ambulatory Visit: Payer: Self-pay

## 2018-04-23 ENCOUNTER — Encounter (HOSPITAL_COMMUNITY): Payer: Self-pay | Admitting: Emergency Medicine

## 2018-04-23 ENCOUNTER — Inpatient Hospital Stay (HOSPITAL_COMMUNITY)
Admission: EM | Admit: 2018-04-23 | Discharge: 2018-05-13 | DRG: 853 | Disposition: A | Discharge status: Skilled Nursing Facility | Payer: Medicare Other | Attending: Internal Medicine | Admitting: Internal Medicine

## 2018-04-23 DIAGNOSIS — I361 Nonrheumatic tricuspid (valve) insufficiency: Secondary | ICD-10-CM | POA: Diagnosis not present

## 2018-04-23 DIAGNOSIS — D696 Thrombocytopenia, unspecified: Secondary | ICD-10-CM | POA: Diagnosis present

## 2018-04-23 DIAGNOSIS — R7881 Bacteremia: Secondary | ICD-10-CM

## 2018-04-23 DIAGNOSIS — N390 Urinary tract infection, site not specified: Secondary | ICD-10-CM | POA: Diagnosis present

## 2018-04-23 DIAGNOSIS — R6520 Severe sepsis without septic shock: Secondary | ICD-10-CM | POA: Diagnosis present

## 2018-04-23 DIAGNOSIS — N179 Acute kidney failure, unspecified: Secondary | ICD-10-CM | POA: Diagnosis present

## 2018-04-23 DIAGNOSIS — G8191 Hemiplegia, unspecified affecting right dominant side: Secondary | ICD-10-CM | POA: Diagnosis present

## 2018-04-23 DIAGNOSIS — F250 Schizoaffective disorder, bipolar type: Secondary | ICD-10-CM | POA: Diagnosis present

## 2018-04-23 DIAGNOSIS — A4159 Other Gram-negative sepsis: Principal | ICD-10-CM | POA: Diagnosis present

## 2018-04-23 DIAGNOSIS — G9341 Metabolic encephalopathy: Secondary | ICD-10-CM | POA: Diagnosis present

## 2018-04-23 DIAGNOSIS — Z8546 Personal history of malignant neoplasm of prostate: Secondary | ICD-10-CM

## 2018-04-23 DIAGNOSIS — A419 Sepsis, unspecified organism: Secondary | ICD-10-CM | POA: Diagnosis not present

## 2018-04-23 DIAGNOSIS — E210 Primary hyperparathyroidism: Secondary | ICD-10-CM | POA: Diagnosis present

## 2018-04-23 DIAGNOSIS — E213 Hyperparathyroidism, unspecified: Secondary | ICD-10-CM | POA: Diagnosis not present

## 2018-04-23 DIAGNOSIS — E872 Acidosis, unspecified: Secondary | ICD-10-CM | POA: Diagnosis present

## 2018-04-23 DIAGNOSIS — I10 Essential (primary) hypertension: Secondary | ICD-10-CM | POA: Diagnosis present

## 2018-04-23 DIAGNOSIS — R1310 Dysphagia, unspecified: Secondary | ICD-10-CM | POA: Diagnosis present

## 2018-04-23 DIAGNOSIS — G9340 Encephalopathy, unspecified: Secondary | ICD-10-CM | POA: Diagnosis not present

## 2018-04-23 DIAGNOSIS — Z79899 Other long term (current) drug therapy: Secondary | ICD-10-CM | POA: Diagnosis not present

## 2018-04-23 DIAGNOSIS — R509 Fever, unspecified: Secondary | ICD-10-CM | POA: Diagnosis not present

## 2018-04-23 DIAGNOSIS — D351 Benign neoplasm of parathyroid gland: Secondary | ICD-10-CM | POA: Diagnosis present

## 2018-04-23 DIAGNOSIS — E669 Obesity, unspecified: Secondary | ICD-10-CM | POA: Diagnosis present

## 2018-04-23 DIAGNOSIS — A523 Neurosyphilis, unspecified: Secondary | ICD-10-CM | POA: Diagnosis present

## 2018-04-23 DIAGNOSIS — F0280 Dementia in other diseases classified elsewhere without behavioral disturbance: Secondary | ICD-10-CM | POA: Diagnosis present

## 2018-04-23 DIAGNOSIS — F17210 Nicotine dependence, cigarettes, uncomplicated: Secondary | ICD-10-CM | POA: Diagnosis present

## 2018-04-23 DIAGNOSIS — R479 Unspecified speech disturbances: Secondary | ICD-10-CM | POA: Diagnosis not present

## 2018-04-23 DIAGNOSIS — F419 Anxiety disorder, unspecified: Secondary | ICD-10-CM | POA: Diagnosis present

## 2018-04-23 DIAGNOSIS — E870 Hyperosmolality and hypernatremia: Secondary | ICD-10-CM | POA: Diagnosis present

## 2018-04-23 DIAGNOSIS — Z8659 Personal history of other mental and behavioral disorders: Secondary | ICD-10-CM | POA: Diagnosis not present

## 2018-04-23 DIAGNOSIS — A498 Other bacterial infections of unspecified site: Secondary | ICD-10-CM | POA: Diagnosis not present

## 2018-04-23 DIAGNOSIS — R4701 Aphasia: Secondary | ICD-10-CM | POA: Diagnosis present

## 2018-04-23 DIAGNOSIS — R4702 Dysphasia: Secondary | ICD-10-CM | POA: Diagnosis present

## 2018-04-23 DIAGNOSIS — E860 Dehydration: Secondary | ICD-10-CM | POA: Diagnosis present

## 2018-04-23 DIAGNOSIS — A4150 Gram-negative sepsis, unspecified: Secondary | ICD-10-CM

## 2018-04-23 DIAGNOSIS — E875 Hyperkalemia: Secondary | ICD-10-CM | POA: Diagnosis present

## 2018-04-23 DIAGNOSIS — R29810 Facial weakness: Secondary | ICD-10-CM | POA: Diagnosis present

## 2018-04-23 DIAGNOSIS — F1290 Cannabis use, unspecified, uncomplicated: Secondary | ICD-10-CM | POA: Diagnosis not present

## 2018-04-23 DIAGNOSIS — E876 Hypokalemia: Secondary | ICD-10-CM | POA: Diagnosis not present

## 2018-04-23 DIAGNOSIS — Z008 Encounter for other general examination: Secondary | ICD-10-CM | POA: Diagnosis not present

## 2018-04-23 DIAGNOSIS — Z6830 Body mass index (BMI) 30.0-30.9, adult: Secondary | ICD-10-CM

## 2018-04-23 DIAGNOSIS — R410 Disorientation, unspecified: Secondary | ICD-10-CM

## 2018-04-23 DIAGNOSIS — R4182 Altered mental status, unspecified: Secondary | ICD-10-CM | POA: Diagnosis present

## 2018-04-23 DIAGNOSIS — F1099 Alcohol use, unspecified with unspecified alcohol-induced disorder: Secondary | ICD-10-CM | POA: Diagnosis not present

## 2018-04-23 DIAGNOSIS — R29717 NIHSS score 17: Secondary | ICD-10-CM | POA: Diagnosis present

## 2018-04-23 HISTORY — DX: Other symptoms and signs involving cognitive functions and awareness: R41.89

## 2018-04-23 LAB — URINALYSIS, ROUTINE W REFLEX MICROSCOPIC
BILIRUBIN URINE: NEGATIVE
GLUCOSE, UA: 50 mg/dL — AB
KETONES UR: NEGATIVE mg/dL
Nitrite: NEGATIVE
PH: 5 (ref 5.0–8.0)
PROTEIN: 100 mg/dL — AB
Specific Gravity, Urine: 1.021 (ref 1.005–1.030)
WBC, UA: >50 WBC/hpf — ABNORMAL HIGH (ref 0–5)

## 2018-04-23 LAB — COMPREHENSIVE METABOLIC PANEL
ALBUMIN: 3.2 g/dL — AB (ref 3.5–5.0)
ALT: 36 U/L (ref 17–63)
AST: 36 U/L (ref 15–41)
Alkaline Phosphatase: 45 U/L (ref 38–126)
Anion gap: 11 (ref 5–15)
BILIRUBIN TOTAL: 0.8 mg/dL (ref 0.3–1.2)
BUN: 21 mg/dL — AB (ref 6–20)
CALCIUM: 11.8 mg/dL — AB (ref 8.9–10.3)
CO2: 26 mmol/L (ref 22–32)
CREATININE: 1.58 mg/dL — AB (ref 0.61–1.24)
Chloride: 100 mmol/L — ABNORMAL LOW (ref 101–111)
GFR calc Af Amer: 48 mL/min — ABNORMAL LOW (ref >60)
GFR, EST NON AFRICAN AMERICAN: 41 mL/min — AB (ref >60)
GLUCOSE: 110 mg/dL — AB (ref 65–99)
Potassium: 5.2 mmol/L — ABNORMAL HIGH (ref 3.5–5.1)
Sodium: 137 mmol/L (ref 135–145)
TOTAL PROTEIN: 6.8 g/dL (ref 6.5–8.1)

## 2018-04-23 LAB — CBC WITH DIFFERENTIAL/PLATELET
Basophils Absolute: 0.1 10*3/uL (ref 0.0–0.1)
Basophils Relative: 0 %
EOS ABS: 0 10*3/uL (ref 0.0–0.7)
EOS PCT: 0 %
HEMATOCRIT: 48.1 % (ref 39.0–52.0)
Hemoglobin: 15.9 g/dL (ref 13.0–17.0)
LYMPHS ABS: 0.2 10*3/uL — AB (ref 0.7–4.0)
LYMPHS PCT: 1 %
MCH: 28.2 pg (ref 26.0–34.0)
MCHC: 33.1 g/dL (ref 30.0–36.0)
MCV: 85.4 fL (ref 78.0–100.0)
MONO ABS: 1.4 10*3/uL — AB (ref 0.1–1.0)
MONOS PCT: 9 %
Neutro Abs: 14 10*3/uL (ref 1.7–7.7)
Neutrophils Relative %: 90 %
Platelets: 82 10*3/uL — ABNORMAL LOW (ref 150–400)
RBC: 5.63 MIL/uL (ref 4.22–5.81)
RDW: 15.3 % (ref 11.5–15.5)
WBC: 15.6 10*3/uL — AB (ref 4.0–10.5)

## 2018-04-23 LAB — PROTIME-INR
INR: 1.23
Prothrombin Time: 15.4 seconds — ABNORMAL HIGH (ref 11.4–15.2)

## 2018-04-23 LAB — LACTIC ACID, PLASMA
Lactic Acid, Venous: 5.2 mmol/L (ref 0.5–1.9)
Lactic Acid, Venous: 4.3 mmol/L (ref 0.5–1.9)

## 2018-04-23 LAB — TROPONIN I

## 2018-04-23 LAB — MRSA PCR SCREENING: MRSA BY PCR: NEGATIVE

## 2018-04-23 LAB — VALPROIC ACID LEVEL: VALPROIC ACID LVL: 67 ug/mL (ref 50.0–100.0)

## 2018-04-23 LAB — CK: CK TOTAL: 59 U/L (ref 49–397)

## 2018-04-23 LAB — CBG MONITORING, ED: GLUCOSE-CAPILLARY: 118 mg/dL — AB (ref 65–99)

## 2018-04-23 MED ORDER — SODIUM CHLORIDE 0.9 % IV BOLUS
500.0000 mL | Freq: Once | INTRAVENOUS | Status: AC
  Administered 2018-04-23: 500 mL via INTRAVENOUS

## 2018-04-23 MED ORDER — OLANZAPINE 5 MG PO TABS: 5.0000 mg | ORAL_TABLET | ORAL | Status: DC

## 2018-04-23 MED ORDER — VALPROATE SODIUM 500 MG/5ML IV SOLN
INTRAVENOUS | Status: AC
  Filled 2018-04-23: qty 5

## 2018-04-23 MED ORDER — GABAPENTIN 300 MG PO CAPS
300.0000 mg | ORAL_CAPSULE | Freq: Three times a day (TID) | ORAL | Status: DC
  Administered 2018-04-25 – 2018-05-05 (×23): 300 mg via ORAL
  Filled 2018-04-23 (×26): qty 1

## 2018-04-23 MED ORDER — ACETAMINOPHEN 325 MG PO TABS: 650.0000 mg | ORAL_TABLET | Freq: Four times a day (QID) | ORAL | Status: DC | PRN

## 2018-04-23 MED ORDER — SODIUM CHLORIDE 0.9 % IV SOLN
1.0000 g | Freq: Three times a day (TID) | INTRAVENOUS | Status: DC
  Administered 2018-04-24 (×2): 1 g via INTRAVENOUS
  Filled 2018-04-23 (×7): qty 1

## 2018-04-23 MED ORDER — DIVALPROEX SODIUM ER 500 MG PO TB24: 1000.0000 mg | ORAL_TABLET | Freq: Every day | ORAL | Status: DC

## 2018-04-23 MED ORDER — VALPROATE SODIUM 500 MG/5ML IV SOLN: 250.0000 mg | Freq: Every day | INTRAVENOUS | Status: DC

## 2018-04-23 MED ORDER — LATANOPROST 0.005 % OP SOLN
1.0000 [drp] | Freq: Every day | OPHTHALMIC | Status: DC
  Administered 2018-04-23 – 2018-05-12 (×19): 1 [drp] via OPHTHALMIC
  Filled 2018-04-23 (×4): qty 2.5

## 2018-04-23 MED ORDER — SODIUM CHLORIDE 0.9 % IV SOLN
INTRAVENOUS | Status: DC
  Administered 2018-04-23: 21:00:00 via INTRAVENOUS

## 2018-04-23 MED ORDER — LATANOPROST 0.005 % OP SOLN
OPHTHALMIC | Status: AC
  Filled 2018-04-23: qty 2.5

## 2018-04-23 MED ORDER — SODIUM CHLORIDE 0.9 % IV SOLN
2.0000 g | Freq: Once | INTRAVENOUS | Status: AC
  Administered 2018-04-23: 2 g via INTRAVENOUS
  Filled 2018-04-23: qty 2

## 2018-04-23 MED ORDER — ENOXAPARIN SODIUM 40 MG/0.4ML ~~LOC~~ SOLN
40.0000 mg | SUBCUTANEOUS | Status: DC
  Administered 2018-04-23: 40 mg via SUBCUTANEOUS
  Filled 2018-04-23: qty 0.4

## 2018-04-23 MED ORDER — VALPROATE SODIUM 500 MG/5ML IV SOLN
250.0000 mg | INTRAVENOUS | Status: DC
  Administered 2018-04-24 – 2018-05-05 (×35): 250 mg via INTRAVENOUS
  Filled 2018-04-23 (×42): qty 2.5

## 2018-04-23 MED ORDER — ACETAMINOPHEN 650 MG RE SUPP
650.0000 mg | Freq: Once | RECTAL | Status: AC
  Administered 2018-04-23: 650 mg via RECTAL
  Filled 2018-04-23: qty 1

## 2018-04-23 MED ORDER — DIVALPROEX SODIUM ER 250 MG PO TB24
250.0000 mg | ORAL_TABLET | Freq: Every day | ORAL | Status: DC
  Filled 2018-04-23 (×3): qty 1

## 2018-04-23 MED ORDER — ACETAMINOPHEN 650 MG RE SUPP
650.0000 mg | Freq: Four times a day (QID) | RECTAL | Status: DC | PRN
  Administered 2018-04-24 – 2018-04-26 (×2): 650 mg via RECTAL
  Filled 2018-04-23 (×4): qty 1

## 2018-04-23 MED ORDER — SODIUM CHLORIDE 0.9 % IV BOLUS
30.0000 mL/kg | Freq: Once | INTRAVENOUS | Status: AC
  Administered 2018-04-23: 2721 mL via INTRAVENOUS

## 2018-04-23 MED ORDER — LEVOFLOXACIN IN D5W 750 MG/150ML IV SOLN
750.0000 mg | Freq: Once | INTRAVENOUS | Status: AC
  Administered 2018-04-23: 750 mg via INTRAVENOUS
  Filled 2018-04-23: qty 150

## 2018-04-23 MED ORDER — VALPROATE SODIUM 500 MG/5ML IV SOLN
500.0000 mg | INTRAVENOUS | Status: DC
  Administered 2018-04-23 – 2018-05-04 (×12): 500 mg via INTRAVENOUS
  Filled 2018-04-23 (×15): qty 5

## 2018-04-23 MED ORDER — AMANTADINE HCL 100 MG PO CAPS
100.0000 mg | ORAL_CAPSULE | Freq: Two times a day (BID) | ORAL | Status: DC
  Administered 2018-04-25 – 2018-05-02 (×12): 100 mg via ORAL
  Filled 2018-04-23 (×21): qty 1

## 2018-04-23 MED ORDER — VALPROATE SODIUM 500 MG/5ML IV SOLN
500.0000 mg | Freq: Two times a day (BID) | INTRAVENOUS | Status: DC
  Filled 2018-04-23 (×4): qty 5
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
## 2018-04-23 NOTE — ED Notes (Signed)
In and out cath done.  Pt had purulent drainage from urethra.  Diaper noted to be wet with dark, foul urine.  Changed and cath done.

## 2018-04-23 NOTE — ED Notes (Signed)
Course left upper lung sounds, otherwise clear to auscultation prior to fluid bolus.

## 2018-04-23 NOTE — Progress Notes (Addendum)
Pharmacy Note:  Initial antibiotic(s) regimen of Levaquin and Aztreonam ordered by EDP to treat Sepsis/UTI.  Estimated Creatinine Clearance: 45 mL/min (A) (by C-G formula based on SCr of 1.58 mg/dL (H)).   Allergies  Allergen Reactions  . Penicillins Swelling    Has patient had a PCN reaction causing immediate rash, facial/tongue/throat swelling, SOB or lightheadedness with hypotension: Yes Has patient had a PCN reaction causing severe rash involving mucus membranes or skin necrosis: No Has patient had a PCN reaction that required hospitalization: No Has patient had a PCN reaction occurring within the last 10 years: No If all of the above answers are "NO", then may proceed with Cephalosporin use.     Vitals:   04/23/18 1700 04/23/18 1707  BP: 116/71   Pulse: 87   Resp: (!) 24   Temp:  (!) 100.8 F (38.2 C)  SpO2: 97%     Anti-infectives (From admission, onward)   Start     Dose/Rate Route Frequency Ordered Stop   04/23/18 1715  levofloxacin (LEVAQUIN) IVPB 750 mg     750 mg 100 mL/hr over 90 Minutes Intravenous  Once 04/23/18 1703     04/23/18 1715  aztreonam (AZACTAM) 2 g in sodium chloride 0.9 % 100 mL IVPB     2 g 200 mL/hr over 30 Minutes Intravenous  Once 04/23/18 1703        Plan: Initial dose(s) of Levaquin and Aztreonam X 1 ordered. F/U admission orders for further dosing if therapy continued.  Pricilla Larsson, [facility] 04/23/2018 5:19 PM   Addendum: Continue with Aztreonam on admission.  Levaquin stopped. Aztreonam 1gm IV every 8 hours. Monitor labs, micro and vitals.  Pricilla Larsson, [facility] 04/23/2018 6:22 PM

## 2018-04-23 NOTE — ED Notes (Signed)
Pt continues to only open eyes to voice.  Will follow commands and attempts to assist with rolling onto right side.  Remains nonverbal and does not use right side.

## 2018-04-23 NOTE — ED Triage Notes (Signed)
Pt brought in from [place] for altered mental status for a week.  Pt normally can walk and communicate.  Pt has not been doing these and staff thought it was his schizophrenia acting up.

## 2018-04-23 NOTE — ED Notes (Signed)
Date and time results received: 04/23/18 6:39 PM  (use smartphrase ".now" to insert current time)  Test: Lactic Critical Value: 4.3  Name of Provider Notified: [place]  Orders Received? Or Actions Taken?: Orders Received - See Orders for details

## 2018-04-23 NOTE — ED Notes (Signed)
Pt suddenly threw covers off and began attempting to sit up.  Went into room to check on pt and he asked where he was at.  Told pt he was at [facility] and pt began stating "I'm not back to [facility].." and the rest was incomprehensible.  Had pt repeat himself multiple times without success in understanding him.  Is now looking around and much more responsive.

## 2018-04-23 NOTE — ED Notes (Signed)
At this time volume to complete fluid bolus is hanging.  No further bolus needed after these bags complete.
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
## 2018-04-23 NOTE — Progress Notes (Signed)
Dr. Darrick Meigs paged to make aware that pts seizure medications is ordered po-and to see if he could order it IV. Pt failed bedside swallow screen in ED and speech ordered for tomorrow. Waiting for call back/orders.

## 2018-04-23 NOTE — ED Notes (Signed)
CRITICAL VALUE ALERT  Critical Value:  Lactic acid 5.2  Date & Time Notied:  04/23/2018  Provider Notified: Dr. Thurnell Garbe, [place]  Orders Received/Actions taken: see chart

## 2018-04-24 ENCOUNTER — Inpatient Hospital Stay (HOSPITAL_COMMUNITY): Payer: Medicare Other

## 2018-04-24 ENCOUNTER — Other Ambulatory Visit (HOSPITAL_COMMUNITY): Payer: Self-pay

## 2018-04-24 DIAGNOSIS — E872 Acidosis, unspecified: Secondary | ICD-10-CM | POA: Diagnosis present

## 2018-04-24 DIAGNOSIS — I361 Nonrheumatic tricuspid (valve) insufficiency: Secondary | ICD-10-CM

## 2018-04-24 DIAGNOSIS — R4182 Altered mental status, unspecified: Secondary | ICD-10-CM

## 2018-04-24 DIAGNOSIS — G9341 Metabolic encephalopathy: Secondary | ICD-10-CM | POA: Diagnosis present

## 2018-04-24 DIAGNOSIS — G9340 Encephalopathy, unspecified: Secondary | ICD-10-CM | POA: Diagnosis present

## 2018-04-24 DIAGNOSIS — N179 Acute kidney failure, unspecified: Secondary | ICD-10-CM | POA: Diagnosis present

## 2018-04-24 DIAGNOSIS — N390 Urinary tract infection, site not specified: Secondary | ICD-10-CM | POA: Diagnosis present

## 2018-04-24 DIAGNOSIS — E860 Dehydration: Secondary | ICD-10-CM

## 2018-04-24 LAB — BLOOD CULTURE ID PANEL (REFLEXED)
Acinetobacter baumannii: NOT DETECTED
CARBAPENEM RESISTANCE: NOT DETECTED
Candida albicans: NOT DETECTED
Candida glabrata: NOT DETECTED
Candida krusei: NOT DETECTED
Candida parapsilosis: NOT DETECTED
Candida tropicalis: NOT DETECTED
ENTEROBACTERIACEAE SPECIES: DETECTED — AB
ENTEROCOCCUS SPECIES: NOT DETECTED
ESCHERICHIA COLI: NOT DETECTED
Enterobacter cloacae complex: NOT DETECTED
Haemophilus influenzae: NOT DETECTED
Klebsiella oxytoca: NOT DETECTED
Klebsiella pneumoniae: NOT DETECTED
LISTERIA MONOCYTOGENES: NOT DETECTED
NEISSERIA MENINGITIDIS: NOT DETECTED
PSEUDOMONAS AERUGINOSA: NOT DETECTED
Proteus species: DETECTED — AB
SERRATIA MARCESCENS: NOT DETECTED
STAPHYLOCOCCUS AUREUS BCID: NOT DETECTED
STAPHYLOCOCCUS SPECIES: NOT DETECTED
STREPTOCOCCUS AGALACTIAE: NOT DETECTED
STREPTOCOCCUS PYOGENES: NOT DETECTED
Streptococcus pneumoniae: NOT DETECTED
Streptococcus species: NOT DETECTED

## 2018-04-24 LAB — CBC
HCT: 44.7 % (ref 39.0–52.0)
HEMOGLOBIN: 14 g/dL (ref 13.0–17.0)
MCH: 27.6 pg (ref 26.0–34.0)
MCHC: 31.3 g/dL (ref 30.0–36.0)
MCV: 88.2 fL (ref 78.0–100.0)
PLATELETS: 77 10*3/uL — AB (ref 150–400)
RBC: 5.07 MIL/uL (ref 4.22–5.81)
RDW: 15.3 % (ref 11.5–15.5)
WBC: 11.4 10*3/uL — AB (ref 4.0–10.5)

## 2018-04-24 LAB — BASIC METABOLIC PANEL
Anion gap: 6 (ref 5–15)
BUN: 25 mg/dL — AB (ref 6–20)
CALCIUM: 10.8 mg/dL — AB (ref 8.9–10.3)
CO2: 26 mmol/L (ref 22–32)
Chloride: 108 mmol/L (ref 101–111)
Creatinine, Ser: 1.45 mg/dL — ABNORMAL HIGH (ref 0.61–1.24)
GFR calc Af Amer: 53 mL/min — ABNORMAL LOW (ref >60)
GFR, EST NON AFRICAN AMERICAN: 46 mL/min — AB (ref >60)
GLUCOSE: 95 mg/dL (ref 65–99)
Potassium: 4.5 mmol/L (ref 3.5–5.1)
SODIUM: 140 mmol/L (ref 135–145)

## 2018-04-24 LAB — ECHOCARDIOGRAM COMPLETE
Height: 71 in
WEIGHTICAEL: 3477.98 [oz_av]

## 2018-04-24 LAB — LACTIC ACID, PLASMA
LACTIC ACID, VENOUS: 2.8 mmol/L — AB (ref 0.5–1.9)
LACTIC ACID, VENOUS: 3.3 mmol/L — AB (ref 0.5–1.9)

## 2018-04-24 LAB — COMPREHENSIVE METABOLIC PANEL
ALT: 31 U/L (ref 17–63)
ANION GAP: 6 (ref 5–15)
AST: 29 U/L (ref 15–41)
Albumin: 2.6 g/dL — ABNORMAL LOW (ref 3.5–5.0)
Alkaline Phosphatase: 40 U/L (ref 38–126)
BUN: 26 mg/dL — ABNORMAL HIGH (ref 6–20)
CHLORIDE: 107 mmol/L (ref 101–111)
CO2: 26 mmol/L (ref 22–32)
CREATININE: 1.34 mg/dL — AB (ref 0.61–1.24)
Calcium: 10.9 mg/dL — ABNORMAL HIGH (ref 8.9–10.3)
GFR, EST AFRICAN AMERICAN: 59 mL/min — AB (ref >60)
GFR, EST NON AFRICAN AMERICAN: 51 mL/min — AB (ref >60)
Glucose, Bld: 92 mg/dL (ref 65–99)
Potassium: 4.8 mmol/L (ref 3.5–5.1)
Sodium: 139 mmol/L (ref 135–145)
Total Bilirubin: 0.8 mg/dL (ref 0.3–1.2)
Total Protein: 5.9 g/dL — ABNORMAL LOW (ref 6.5–8.1)

## 2018-04-24 LAB — LIPID PANEL
Cholesterol: 85 mg/dL (ref 0–200)
HDL: 43 mg/dL (ref >40)
LDL Cholesterol: 32 mg/dL (ref 0–99)
TRIGLYCERIDES: 49 mg/dL (ref <150)
Total CHOL/HDL Ratio: 2 RATIO
VLDL: 10 mg/dL (ref 0–40)

## 2018-04-24 LAB — HEMOGLOBIN A1C
HEMOGLOBIN A1C: 5.3 % (ref 4.8–5.6)
MEAN PLASMA GLUCOSE: 105.41 mg/dL

## 2018-04-24 LAB — GLUCOSE, CAPILLARY: Glucose-Capillary: 71 mg/dL (ref 65–99)

## 2018-04-24 MED ORDER — ENOXAPARIN SODIUM 40 MG/0.4ML ~~LOC~~ SOLN: 40.0000 mg | SUBCUTANEOUS | Status: DC

## 2018-04-24 MED ORDER — ACETAMINOPHEN 160 MG/5ML PO SOLN
650.0000 mg | ORAL | Status: DC | PRN
  Filled 2018-04-24: qty 20.3

## 2018-04-24 MED ORDER — IOPAMIDOL (ISOVUE-370) INJECTION 76%
64.0000 mL | Freq: Once | INTRAVENOUS | Status: AC | PRN
  Administered 2018-04-24: 64 mL via INTRAVENOUS

## 2018-04-24 MED ORDER — ACETAMINOPHEN 650 MG RE SUPP
650.0000 mg | RECTAL | Status: DC | PRN
  Administered 2018-04-25 – 2018-04-26 (×2): 650 mg via RECTAL
  Filled 2018-04-24: qty 1

## 2018-04-24 MED ORDER — DEXTROSE-NACL 5-0.45 % IV SOLN
INTRAVENOUS | Status: DC
  Administered 2018-04-24 – 2018-04-28 (×5): via INTRAVENOUS
  Administered 2018-04-29 (×2): 1000 mL via INTRAVENOUS
  Administered 2018-04-30: 12:00:00 via INTRAVENOUS
  Administered 2018-05-01: 1000 mL via INTRAVENOUS
  Administered 2018-05-02: 16:00:00 via INTRAVENOUS

## 2018-04-24 MED ORDER — STROKE: EARLY STAGES OF RECOVERY BOOK
Freq: Once | Status: AC
  Administered 2018-04-24: 13:00:00

## 2018-04-24 MED ORDER — SODIUM CHLORIDE 0.9 % IV SOLN
INTRAVENOUS | Status: DC
  Administered 2018-04-24 (×2): via INTRAVENOUS

## 2018-04-24 MED ORDER — ACETAMINOPHEN 325 MG PO TABS: 650.0000 mg | ORAL_TABLET | ORAL | Status: DC | PRN

## 2018-04-24 MED ORDER — SODIUM CHLORIDE 0.9 % IV SOLN
2.0000 g | INTRAVENOUS | Status: DC
  Administered 2018-04-24 – 2018-05-02 (×9): 2 g via INTRAVENOUS
  Filled 2018-04-24 (×9): qty 20

## 2018-04-24 MED ORDER — SODIUM CHLORIDE 0.9 % IV BOLUS
500.0000 mL | Freq: Once | INTRAVENOUS | Status: AC
  Administered 2018-04-24: 500 mL via INTRAVENOUS

## 2018-04-24 NOTE — Progress Notes (Signed)
CRITICAL VALUE ALERT  Critical Value:  Lactic acid 2.8  Date & Time Notied:  04/24/18 @ 0215  Provider Notified: Dr. Olevia Bowens  Orders Received/Actions taken: Lactic acid steadily going down-2.8 is the lowest it has been.

## 2018-04-24 NOTE — Progress Notes (Signed)
PHARMACY - PHYSICIAN COMMUNICATION CRITICAL VALUE ALERT - BLOOD CULTURE IDENTIFICATION (BCID)  Henry Smith is an 78 y.o. male who presented to [name] on 04/23/2018 with a chief complaint of AMS.  Assessment: 41 YOM on antibiotics for concern for UTI/sepsis now with blood cultures showing GNR in both sets and BCID detecting Proteus. The patient is noted to have a PCN allergy listed as "swelling" however the patient has tolerated cephalosporins previously without any documented issues.   Name of physician (or Provider) Contacted: Rai  Current antibiotics: Azactam 1g IV every 8 hours  Changes to prescribed antibiotics recommended:  D/c Azactam and transition to Rocephin 2g IV every 24 hours  Results for orders placed or performed during the hospital encounter of 04/23/18  Blood Culture ID Panel (Reflexed) (Collected: 04/23/2018  5:42 PM)  Result Value Ref Range   Enterococcus species NOT DETECTED NOT DETECTED   Listeria monocytogenes NOT DETECTED NOT DETECTED   Staphylococcus species NOT DETECTED NOT DETECTED   Staphylococcus aureus NOT DETECTED NOT DETECTED   Streptococcus species NOT DETECTED NOT DETECTED   Streptococcus agalactiae NOT DETECTED NOT DETECTED   Streptococcus pneumoniae NOT DETECTED NOT DETECTED   Streptococcus pyogenes NOT DETECTED NOT DETECTED   Acinetobacter baumannii NOT DETECTED NOT DETECTED   Enterobacteriaceae species DETECTED (A) NOT DETECTED   Enterobacter cloacae complex NOT DETECTED NOT DETECTED   Escherichia coli NOT DETECTED NOT DETECTED   Klebsiella oxytoca NOT DETECTED NOT DETECTED   Klebsiella pneumoniae NOT DETECTED NOT DETECTED   Proteus species DETECTED (A) NOT DETECTED   Serratia marcescens NOT DETECTED NOT DETECTED   Carbapenem resistance NOT DETECTED NOT DETECTED   Haemophilus influenzae NOT DETECTED NOT DETECTED   Neisseria meningitidis NOT DETECTED NOT DETECTED   Pseudomonas aeruginosa NOT DETECTED NOT DETECTED   Candida albicans NOT  DETECTED NOT DETECTED   Candida glabrata NOT DETECTED NOT DETECTED   Candida krusei NOT DETECTED NOT DETECTED   Candida parapsilosis NOT DETECTED NOT DETECTED   Candida tropicalis NOT DETECTED NOT DETECTED    Lawson Radar 04/24/2018  1:02 PM

## 2018-04-24 NOTE — Progress Notes (Signed)
This RN called [facility] and spoke to Devade to update her on pts transfer. I also called called Erin pts guardian and left vmail to update her.

## 2018-04-24 NOTE — Progress Notes (Signed)
Pt admitted to unit from [facility]. No signs of pain or discomfort. Pt able to nod yes or no. Stated, "My name is Henry Smith". Welcomed and oriented to unit. Will continue to monitor.

## 2018-04-24 NOTE — Evaluation (Signed)
Physical Therapy Evaluation Patient Details Name: Henry Smith MRN: [id number] DOB: 1941-03-02 Today's Date: 04/24/2018   History of Present Illness  Pt is a 77 y.o. male admitted 04/23/18 with AMS, including non-verbal, not walking and R-side facial droop. Worked up for encephalopathy secondary to sepsis due to UTI, AKI. Head CT and MRI show no acute intracranial finding; mild small vessel change normal for age. PMH includes dementia, schizophrenia, prostate CA.    Clinical Impression  Pt presents with an overall decrease in functional mobility secondary to above. Pt poor historian, only verbalizing name. Not following any commands during evaluation, but performing some automatic movements. Observed functional strength in bilateral hips/knees at least 3/5. Able to sit EOB with intermittent min guard; required mod-maxA (+2 safety) for bed mobility. Pt would benefit from continued acute PT services to maximize functional mobility and independence prior to d/c with SNF-level therapies.     Follow Up Recommendations SNF;Supervision/Assistance - 24 hour    Equipment Recommendations  (TBD)    Recommendations for Other Services       Precautions / Restrictions Precautions Precautions: Fall Restrictions Weight Bearing Restrictions: No      Mobility  Bed Mobility Overal bed mobility: Needs Assistance Bed Mobility: Supine to Sit;Sit to Supine     Supine to sit: Max assist;+2 for safety/equipment Sit to supine: Mod assist;+2 for safety/equipment   General bed mobility comments: MaxA to assist trunk elevation and BLEs off EOB. Less assist to return to supine, although increased time and effort for repositioning in bed, requiring assist for shoulder and hip repositioning  Transfers                    Ambulation/Gait                Stairs            Wheelchair Mobility    Modified Rankin (Stroke Patients Only)       Balance Overall balance assessment:  Needs assistance   Sitting balance-Leahy Scale: Fair Sitting balance - Comments: Reliant on UE support to sit EOB, able to with min guard for balance; intermittent maxA to prevent automatic return to supine                                     Pertinent Vitals/Pain Pain Assessment: Faces Faces Pain Scale: No hurt Pain Intervention(s): Monitored during session    Home Living Family/patient expects to be discharged to:: Unsure                 Additional Comments: Per chart, from SNF. Pt poor historian, unable to relay information    Prior Function Level of Independence: Needs assistance         Comments: Pt poor historian, not answering questions. Per RN, facility reports pt ambulatory at baseline     Hand Dominance        Extremity/Trunk Assessment   Upper Extremity Assessment Upper Extremity Assessment: RUE deficits/detail;LUE deficits/detail;Difficult to assess due to impaired cognition;Defer to OT evaluation RUE Deficits / Details: Not moving to command. Functionally used to hold himself up sitting EOB LUE Deficits / Details: Not moving to command. Functionally able to use for WB at EOB and bring hand to scratch face    Lower Extremity Assessment Lower Extremity Assessment: (Not following commands; functionally moving with hip and knee flex/ext >3/5)       Communication  Communication: Receptive difficulties;Expressive difficulties  Cognition Arousal/Alertness: Awake/alert Behavior During Therapy: Flat affect Overall Cognitive Status: No family/caregiver present to determine baseline cognitive functioning Area of Impairment: Orientation;Attention;Following commands;Awareness                 Orientation Level: Disoriented to;Place;Time;Situation Current Attention Level: Focused   Following Commands: Follows one step commands inconsistently   Awareness: Intellectual   General Comments: Pt states name (not birthday). Will perform  automatic movements, but not following any commands.       General Comments      Exercises     Assessment/Plan    PT Assessment Patient needs continued PT services  PT Problem List Decreased strength;Decreased activity tolerance;Decreased balance;Decreased mobility;Decreased cognition;Decreased knowledge of use of DME;Decreased safety awareness;Decreased knowledge of precautions       PT Treatment Interventions DME instruction;Gait training;Functional mobility training;Therapeutic activities;Therapeutic exercise;Balance training;Stair training;Patient/family education;Neuromuscular re-education;Cognitive remediation    PT Goals (Current goals can be found in the Care Plan section)  Acute Rehab PT Goals PT Goal Formulation: Patient unable to participate in goal setting Time For Goal Achievement: 05/08/18 Potential to Achieve Goals: Fair    Frequency Min 2X/week   Barriers to discharge        Co-evaluation               AM-PAC PT "6 Clicks" Daily Activity  Outcome Measure Difficulty turning over in bed (including adjusting bedclothes, sheets and blankets)?: Unable Difficulty moving from lying on back to sitting on the side of the bed? : Unable Difficulty sitting down on and standing up from a chair with arms (e.g., wheelchair, bedside commode, etc,.)?: Unable Help needed moving to and from a bed to chair (including a wheelchair)?: A Lot Help needed walking in hospital room?: Total Help needed climbing 3-5 steps with a railing? : Total 6 Click Score: 7    End of Session   Activity Tolerance: Patient tolerated treatment well Patient left: in bed;with call bell/phone within reach;with bed alarm set Nurse Communication: Mobility status PT Visit Diagnosis: Other abnormalities of gait and mobility (R26.89);Other symptoms and signs involving the nervous system (R29.898)    Time: 0814-4818 PT Time Calculation (min) (ACUTE ONLY): 15 min   Charges:   PT Evaluation $PT  Eval Moderate Complexity: 1 Mod     PT G Codes:       Mabeline Caras, PT, DPT Acute Rehab Services  Pager: [place] 04/24/2018, 12:40 PM

## 2018-04-24 NOTE — Progress Notes (Signed)
Pt transferred via [facility] via stretcher. All belongings sent with patient. VSS.

## 2018-04-24 NOTE — Consult Note (Signed)
[facility] TeleNeurology Consult Services  Impression:  Encephalopathy Likely secondary to sepsis. Physical exam limited and non-focal on my exam but per records, he was weaker on the right side at his nursing home and earlier in the ED so stroke is also possible.  Not a tpa or NIR candidate due to: outside treatment window, LKW beyond 24 hours   Comments:   Door Time: inpt (arrived to hospital at 1412) [facility] contacted: 119 [facility] at bedside: 123 NIHSS assessment time: [place] known well LKW: May 4th 2019  Recommendations:   Antiplatelet if no obvious contraindication Stroke protocol admission/ orderset suggested with placement on stroke floor tele monitoring Bedside swallow evaluation Treatment of sepsis/ encephalopathy as per IM IV Fluid hydration with NS Euglycemia avoid hyperthermia, PRN acetaminophen dvt ppx Consider neurology consult inpt Discussed with bedside staff Please call with questions  -----------------------------------------------------------------------------------------  CC stroke alert  History of Present Illness   Patient is a 77 yo M NH resident with h/o schizophrenia, neurosyphilis, dementia He was sent in by his nursing home after 1 week of symptoms including, not speaking, not walking, not using his right side. History is obtained by bedside RN and chart as patient is unable to provide.  Per records, NH believed this may be related to his schizophrenia.  In ED, he was dx with UTI, sepsis, and seemed to improve somewhat with IVF His symptoms persisted in the ED. He was admitted to the floor and stroke alert called at that point.      Diagnostic: HCT is negative for acute findings.   Exam: Limited as he is currently non-verbal  NIHSS score:17 - non focal exam, mute, does not follow commands 1A: Level of Consciousness - Alert; keenly responsive 0 1B: Ask Month and Age - 0 Questions Right +2 1C: 'Blink Eyes' & 'Squeeze  Hands' - Performs 0 Tasks +2 2: Test Horizontal Extraocular Movements - Normal 0 3: Test Visual Fields - No Visual Loss 0 4: Test Facial Palsy - Normal symmetry 0 5A: Test Left Arm Motor Drift - No Drift for 10 Seconds 0 5B: Test Right Arm Motor Drift - No Drift for 10 Seconds 0 6A: Test Left Leg Motor Drift - No Effort Against Gravity +3 6B: Test Right Leg Motor Drift - No Effort Against Gravity +3 7: Test Limb Ataxia - No Ataxia 0 8: Test Sensation - Coma/Unresponsive +2 9: Test Language/Aphasia - ute/Global Aphasia: No Usable Speech/Auditory Comprehensionute/Global Aphasia: No Usable Speech/Auditory Comprehension +3 10: Test Dysarthria - Mute/Anarthric +2 11: Test Extinction/Inattention - No abnormality 0     Medical Decision Making:  - Extensive number of diagnosis or management options are considered above.   - Extensive amount of complex data reviewed.   - High risk of complication and/or morbidity or mortality are associated with differential diagnostic considerations above.  - There may be Uncertain outcome and increased probability of prolonged functional impairment or high probability of severe prolonged functional impairment associated with some of these differential diagnosis.  Medical Data Reviewed:  1.Data reviewed include clinical labs, radiology,  Medical Tests;   2.Tests results discussed w/performing or interpreting physician;   3.Obtaining/reviewing old medical records;  4.Obtaining case history from another source;  5.Independent review of image, tracing or specimen.    Patient was informed the Neurology Consult would happen via telehealth (remote video) and consented to receiving care in this manner.

## 2018-04-24 NOTE — Progress Notes (Signed)
Teleneurology consult done- MD stated nothing could be done since he was not a Code Stroke and Teleneurology at night time is just Code Stroke.  Dr. Olevia Bowens informed.

## 2018-04-24 NOTE — Progress Notes (Signed)
Dr. Tana Coast notified of positive blood cultures and random CBG of 71.  D5 with 0.45 NS ordered.

## 2018-04-24 NOTE — Progress Notes (Signed)
Dr  Olevia Bowens paged to see who receiving MD is at [facility]. This RN was told by [facility] that he did not know earlier- pt placement called as well as [facility] where pt is going was called. Neither one of them know.  Dr Olevia Bowens stated Dr. Myna Hidalgo would be receiving pt.

## 2018-04-24 NOTE — Progress Notes (Signed)
Report given to [place] at [facility].

## 2018-04-24 NOTE — Evaluation (Signed)
Occupational Therapy Evaluation Patient Details Name: Henry Smith MRN: [id number] DOB: 1941/07/07 Today's Date: 04/24/2018    History of Present Illness Pt is a 77 y.o. male admitted 04/23/18 with AMS, including non-verbal, not walking and R-side facial droop. Worked up for encephalopathy secondary to sepsis due to UTI, AKI. Head CT and MRI show no acute intracranial finding; mild small vessel change normal for age. PMH includes dementia, schizophrenia, prostate CA.   Clinical Impression   Per chart review and information from RN, pt was living at a SNF and performing BADLs and functional mobility. Pt currently requiring Max A for ADLs and Mod-Max A +2 for bed mobility. Pt presenting with decreased cognition, balance, activity tolerance, and functional use of RUE. Pt not following commands but able to perform movements spontaneously. Pt also stating "My name is Henry Smith" upon OT arrival. Pt would benefit from further acute OT to facilitate safe dc. Recommend dc to SNF for further OT to optimize safety, independence with ADLs, and return to PLOF.      Follow Up Recommendations  SNF    Equipment Recommendations  None recommended by OT    Recommendations for Other Services PT consult;Speech consult     Precautions / Restrictions Precautions Precautions: Fall Restrictions Weight Bearing Restrictions: No      Mobility Bed Mobility Overal bed mobility: Needs Assistance Bed Mobility: Supine to Sit;Sit to Supine     Supine to sit: Max assist;+2 for safety/equipment Sit to supine: Mod assist;+2 for safety/equipment   General bed mobility comments: MaxA to assist trunk elevation and BLEs off EOB. Less assist to return to supine, although increased time and effort for repositioning in bed, requiring assist for shoulder and hip repositioning  Transfers                      Balance Overall balance assessment: Needs assistance   Sitting balance-Leahy Scale:  Fair Sitting balance - Comments: Reliant on UE support to sit EOB, able to with min guard for balance; intermittent maxA to prevent automatic return to supine                                   ADL either performed or assessed with clinical judgement   ADL Overall ADL's : Needs assistance/impaired Eating/Feeding: Maximal assistance;Sitting     Grooming Details (indicate cue type and reason): Pt not following commands. Placing tooth brush in pt's visual field and pt staring at brush but does not reach for it. Upper Body Bathing: Maximal assistance;Sitting;Bed level   Lower Body Bathing: Maximal assistance;Bed level   Upper Body Dressing : Maximal assistance;Sitting;Bed level   Lower Body Dressing: Maximal assistance;Bed level Lower Body Dressing Details (indicate cue type and reason): donning socks               General ADL Comments: Pt able to move LUE/LLE spontaneously. Limited movement of RUE. Not following commands and cues. Sitting at EOB with Min guard for ~ 5 minutes     Vision         Perception     Praxis      Pertinent Vitals/Pain Pain Assessment: Faces Faces Pain Scale: No hurt Pain Intervention(s): Monitored during session     Hand Dominance     Extremity/Trunk Assessment Upper Extremity Assessment Upper Extremity Assessment: RUE deficits/detail;LUE deficits/detail RUE Deficits / Details: Not moving to command. Functionally used to hold himself up  sitting EOB LUE Deficits / Details: Not moving to command. Functionally able to use for WB at EOB and bring hand to scratch face   Lower Extremity Assessment Lower Extremity Assessment: Defer to PT evaluation       Communication Communication Communication: Receptive difficulties;Expressive difficulties(Pt able to state "My name is Henry Smith.")   Cognition Arousal/Alertness: Awake/alert Behavior During Therapy: Flat affect Overall Cognitive Status: No family/caregiver present to  determine baseline cognitive functioning Area of Impairment: Orientation;Attention;Following commands;Awareness                 Orientation Level: Disoriented to;Place;Time;Situation Current Attention Level: Focused   Following Commands: Follows one step commands inconsistently   Awareness: Intellectual   General Comments: Pt stating "my name is Henry Smith". Will perform automatic movements, but not following any commands.    General Comments       Exercises     Shoulder Instructions      Home Living Family/patient expects to be discharged to:: Unsure                                 Additional Comments: Per chart, from SNF. Pt poor historian, unable to relay information      Prior Functioning/Environment Level of Independence: Needs assistance        Comments: Pt poor historian, not answering questions. Per RN, facility reports pt ambulatory at baseline        OT Problem List: Decreased strength;Decreased range of motion;Decreased activity tolerance;Impaired balance (sitting and/or standing);Decreased coordination;Decreased cognition;Decreased safety awareness;Decreased knowledge of use of DME or AE;Decreased knowledge of precautions;Impaired UE functional use      OT Treatment/Interventions: Self-care/ADL training;Therapeutic exercise;Energy conservation;DME and/or AE instruction;Therapeutic activities;Patient/family education    OT Goals(Current goals can be found in the care plan section) Acute Rehab OT Goals Patient Stated Goal: Unstated OT Goal Formulation: Patient unable to participate in goal setting Time For Goal Achievement: 05/08/18 Potential to Achieve Goals: Good ADL Goals Pt Will Perform Grooming: standing;with min assist Pt Will Perform Upper Body Dressing: with min assist;sitting Pt Will Perform Lower Body Dressing: with min assist;sit to/from stand Pt Will Transfer to Toilet: with min assist;stand pivot transfer;bedside  commode Pt Will Perform Toileting - Clothing Manipulation and hygiene: with min assist;sit to/from stand  OT Frequency: Min 2X/week   Barriers to D/C:            Co-evaluation              AM-PAC PT "6 Clicks" Daily Activity     Outcome Measure Help from another person eating meals?: A Lot Help from another person taking care of personal grooming?: A Lot Help from another person toileting, which includes using toliet, bedpan, or urinal?: A Lot Help from another person bathing (including washing, rinsing, drying)?: A Lot Help from another person to put on and taking off regular upper body clothing?: A Lot Help from another person to put on and taking off regular lower body clothing?: A Lot 6 Click Score: 12   End of Session Nurse Communication: Mobility status  Activity Tolerance: Patient tolerated treatment well Patient left: in bed;with call bell/phone within reach;with bed alarm set  OT Visit Diagnosis: Unsteadiness on feet (R26.81);Other abnormalities of gait and mobility (R26.89);Muscle weakness (generalized) (M62.81);Other symptoms and signs involving cognitive function                Time: 1761-6073 OT Time Calculation (min):  13 min Charges:  OT General Charges $OT Visit: 1 Visit OT Evaluation $OT Eval Moderate Complexity: 1 Mod G-Codes:     Lacara Dunsworth MSOT, OTR/L Acute Rehab Pager: [phone] Office: [place] 04/24/2018, 1:16 PM

## 2018-04-24 NOTE — Progress Notes (Signed)
[facility]                                                                              Patient Demographics  Henry Smith, is a 77 y.o. male, DOB - December 02, 1941, FWY:[id number]  Admit date - 04/23/2018   Admitting Physician Oswald Hillock, MD  Outpatient Primary MD for the patient is Caprice Renshaw, MD  Outpatient specialists:   LOS - 1  days   Medical records reviewed and are as summarized below:    Chief Complaint  Patient presents with  . Altered Mental Status       Brief summary   Henry Smith  is a 77 y.o. male, with history of schizophrenia, dementia associated with neurosyphilis, prostate cancer was brought to hospital with altered mental status.  Patient was initially  Nonverbal.  As per ED notes patient has been nonverbal drooping, right-sided facial droop not able to walk as per nursing home staff thought that has "schizophrenia might be acting up".  Patient was sent to ED for further evaluation.  Patient was found to have UTI, lactic acidosis, creatinine 1.5, admitted for further evaluation.  Overnight was noted to have motor aphasia with worsening right-sided hemiparesis, patient was transferred to [facility] for MRI brain to rule out CVA.  Assessment & Plan    Principal Problem:   Sepsis ([place]) secondary to UTI (urinary tract infection), Proteus bacteremia -Patient met sepsis criteria at the time of admission due to lactic acidosis, UTI, acute kidney injury, febrile, tachycardia, tachypnea -Blood cultures showing Proteus, follow urine cultures,  -Discussed with pharmacy, placed on IV Rocephin  Active Problems:   Schizoaffective disorder, bipolar type ([place]) -Continue Depakote, olanzapine    Acute metabolic encephalopathy -Likely due to #1, however there was concern for possible stroke hence patient was transferred to [facility]. - MRI of the brain showed no acute stroke     Lactic acidosis,  AKI (acute kidney injury) ([place]) - Continue IV fluid  hydration, baseline creatinine 0.8.  Presented with creatinine of 1.58 with lactic acidosis 5.2 -Currently improving, creatinine 1.3, lactic acid improving to 3.3  Code Status; full  DVT Prophylaxis:  Lovenox  Family Communication: No family member at bedside   Disposition Plan:   Time Spent in minutes   35 minutes  Procedures:  MRI brain  Consultants:   Neurology  Antimicrobials:  IV aztreonam 5/11  Medications  Scheduled Meds: . amantadine  100 mg Oral BID  . gabapentin  300 mg Oral TID  . latanoprost  1 drop Both Eyes QHS   Continuous Infusions: . sodium chloride Stopped (04/24/18 0232)  . sodium chloride 100 mL/hr at 04/24/18 0305  . cefTRIAXone (ROCEPHIN)  IV    . sodium chloride    . valproate sodium Stopped (04/24/18 0019)   And  . valproate sodium 250 mg (04/24/18 1326)   PRN Meds:.acetaminophen **OR** acetaminophen (TYLENOL) oral liquid 160 mg/5 mL **OR** acetaminophen, acetaminophen **OR** acetaminophen   Antibiotics   Anti-infectives (From admission, onward)   Start     Dose/Rate Route Frequency Ordered Stop   04/24/18 1800  cefTRIAXone (ROCEPHIN) 2 g in sodium chloride 0.9 %  100 mL IVPB     2 g 200 mL/hr over 30 Minutes Intravenous Every 24 hours 04/24/18 1323     04/24/18 0400  aztreonam (AZACTAM) 1 g in sodium chloride 0.9 % 100 mL IVPB  Status:  Discontinued     1 g 200 mL/hr over 30 Minutes Intravenous Every 8 hours 04/23/18 1826 04/24/18 1323   04/23/18 1715  levofloxacin (LEVAQUIN) IVPB 750 mg     750 mg 100 mL/hr over 90 Minutes Intravenous  Once 04/23/18 1703 04/23/18 1909   04/23/18 1715  aztreonam (AZACTAM) 2 g in sodium chloride 0.9 % 100 mL IVPB     2 g 200 mL/hr over 30 Minutes Intravenous  Once 04/23/18 1703 04/23/18 1953        Subjective:   Hikaru Delorenzo was seen and examined today.  Confused, oriented to self otherwise unable to obtain a review of system from the patient.  Low-grade temp of 99.2 F.  No acute issues  overnight.   Objective:   Vitals:   04/24/18 0542 04/24/18 0548 04/24/18 0755 04/24/18 1229  BP: 132/60 131/72 133/83 (!) 156/87  Pulse: 97 89 96 95  Resp: 16 20 (!) 22 20  Temp: 99.4 F (37.4 C) 99.2 F (37.3 C) 98.7 F (37.1 C)   TempSrc: Oral Axillary Oral   SpO2: 100% 98% 99% 99%  Weight:  98.6 kg (217 lb 6 oz)    Height:        Intake/Output Summary (Last 24 hours) at 04/24/2018 1352 Last data filed at 04/24/2018 0428 Gross per 24 hour  Intake 1595.83 ml  Output 470 ml  Net 1125.83 ml     Wt Readings from Last 3 Encounters:  04/24/18 98.6 kg (217 lb 6 oz)  12/24/17 89.4 kg (197 lb)  10/27/17 89.8 kg (197 lb 14.4 oz)     Exam  General: Confused, oriented to self  Eyes:   HEENT:   Cardiovascular: S1 S2 auscultated, Regular rate and rhythm.  Respiratory: Clear to auscultation bilaterally, no wheezing, rales or rhonchi  Gastrointestinal: Soft, nontender, nondistended, + bowel sounds  Ext: no pedal edema bilaterally  Neuro: does not follow commands  Musculoskeletal: No digital cyanosis, clubbing  Skin: No rashes  Psych: confused   Data Reviewed:  I have personally reviewed following labs and imaging studies  Micro Results Recent Results (from the past 240 hour(s))  Culture, blood (routine x 2)     Status: None (Preliminary result)   Collection Time: 04/23/18  5:41 PM  Result Value Ref Range Status   Specimen Description   Final    RIGHT ANTECUBITAL Performed at [facility], [address], [place], [place] [address]    Special Requests   Final    BOTTLES DRAWN AEROBIC AND ANAEROBIC Blood Culture adequate volume Performed at [facility], [address], [place], [place] [address]    Culture  Setup Time   Final    GRAM NEGATIVE RODS IN BOTH AEROBIC AND ANAEROBIC BOTTLES Gram Stain Report Called to,Read Back By and Verified With: NESBITT,R_0  BY MATTHEWS, B 5.12.19 Performed at [facility] Performed at [facility], [address], [place], [place] [address]    Culture GRAM NEGATIVE RODS  Final   Report Status PENDING  Incomplete  Culture, blood (routine x 2)     Status: None (Preliminary result)   Collection Time: 04/23/18  5:42 PM  Result Value Ref Range Status   Specimen Description   Final  BLOOD LEFT ARM Performed at [facility], [address] [place], [place] [address]    Special Requests   Final    BOTTLES DRAWN AEROBIC ONLY Blood Culture results may not be optimal due to an inadequate volume of blood received in culture bottles Performed at [facility], [address] [place], [place] [address]    Culture  Setup Time   Final    GRAM NEGATIVE RODS AEROBIC BOTTLE ONLY Gram Stain Report Called to,Read Back By and Verified With: NESBITT,R_0  BY MATTHEWS, B 5.12.19 Performed at [place]  Final   Report Status PENDING  Incomplete  Blood Culture ID Panel (Reflexed)     Status: Abnormal   Collection Time: 04/23/18  5:42 PM  Result Value Ref Range Status   Enterococcus species NOT DETECTED NOT DETECTED Final   Listeria monocytogenes NOT DETECTED NOT DETECTED Final   Staphylococcus species NOT DETECTED NOT DETECTED Final   Staphylococcus aureus NOT DETECTED NOT DETECTED Final   Streptococcus species NOT DETECTED NOT DETECTED Final   Streptococcus agalactiae NOT DETECTED NOT DETECTED Final   Streptococcus pneumoniae NOT DETECTED NOT DETECTED Final   Streptococcus pyogenes NOT DETECTED NOT DETECTED Final   Acinetobacter baumannii NOT DETECTED NOT DETECTED Final   Enterobacteriaceae species DETECTED (A) NOT DETECTED Final    Comment: Enterobacteriaceae represent a large family of gram-negative bacteria, not a single organism. CRITICAL RESULT CALLED TO, READ BACK BY AND VERIFIED WITH: NITA JOHNSTON PHRAMD AT [facility] AT 2482 ON [id number] BY SJW    Enterobacter cloacae complex NOT DETECTED NOT DETECTED Final   Escherichia coli NOT DETECTED NOT DETECTED Final    Klebsiella oxytoca NOT DETECTED NOT DETECTED Final   Klebsiella pneumoniae NOT DETECTED NOT DETECTED Final   Proteus species DETECTED (A) NOT DETECTED Final    Comment: CRITICAL RESULT CALLED TO, READ BACK BY AND VERIFIED WITH: NICK HAYES PHARMD AT 1245 ON [id number] BY SJW    Serratia marcescens NOT DETECTED NOT DETECTED Final   Carbapenem resistance NOT DETECTED NOT DETECTED Final   Haemophilus influenzae NOT DETECTED NOT DETECTED Final   Neisseria meningitidis NOT DETECTED NOT DETECTED Final   Pseudomonas aeruginosa NOT DETECTED NOT DETECTED Final   Candida albicans NOT DETECTED NOT DETECTED Final   Candida glabrata NOT DETECTED NOT DETECTED Final   Candida krusei NOT DETECTED NOT DETECTED Final   Candida parapsilosis NOT DETECTED NOT DETECTED Final   Candida tropicalis NOT DETECTED NOT DETECTED Final    Comment: Performed at [facility], [address] [place], [place] [address]  MRSA PCR Screening     Status: None   Collection Time: 04/23/18  8:06 PM  Result Value Ref Range Status   MRSA by PCR NEGATIVE NEGATIVE Final    Comment:        The GeneXpert MRSA Assay (FDA approved for NASAL specimens only), is one component of a comprehensive MRSA colonization surveillance program. It is not intended to diagnose MRSA infection nor to guide or monitor treatment for MRSA infections. Performed at [facility], [address], [place], [place] [address]     Radiology Reports Ct Angio Head W Or Wo Contrast  Result Date: 04/24/2018 CLINICAL DATA:  Altered mental status. Weakness and facial droop for 1 week. Urosepsis. History of schizophrenia, dementia with neurosyphilis, prostate cancer. EXAM: CT ANGIOGRAPHY HEAD AND NECK TECHNIQUE: Multidetector CT imaging of the head and neck was performed using the standard protocol during bolus administration of  intravenous contrast. Multiplanar CT image reconstructions and MIPs were obtained to evaluate the vascular anatomy. Carotid stenosis  measurements (when applicable) are obtained utilizing NASCET criteria, using the distal internal carotid diameter as the denominator. CONTRAST:  76m ISOVUE-370 IOPAMIDOL (ISOVUE-370) INJECTION 76% COMPARISON:  CT angiogram head and neck January 28, 2017 FINDINGS: CTA NECK FINDINGS: AORTIC ARCH: Normal appearance of the thoracic arch, normal branch pattern. Mild calcific atherosclerosis aortic arch. The origins of the innominate, left Common carotid artery and subclavian artery are widely patent. RIGHT CAROTID SYSTEM: Common carotid artery is patent. Normal appearance of the carotid bifurcation without hemodynamically significant stenosis by NASCET criteria. Normal appearance of the internal carotid artery, developmentally smaller. LEFT CAROTID SYSTEM: Common carotid artery is patent. Normal appearance of the carotid bifurcation without hemodynamically significant stenosis by NASCET criteria. Normal appearance of the internal carotid artery. VERTEBRAL ARTERIES:Proximal LEFT vertebral artery obscured by streak artifact from venous contamination. Symmetric patent vertebral artery's without luminal irregularity. SKELETON: No acute osseous process though bone windows have not been submitted. Severe degenerative change of the cervical spine. OTHER NECK: Soft tissues of the neck are nonacute though, not tailored for evaluation. UPPER CHEST: Minimal debris RIGHT mainstem bronchus. CTA HEAD FINDINGS: ANTERIOR CIRCULATION: Patent cervical internal carotid arteries, petrous, cavernous and supra clinoid internal carotid arteries. Patent anterior communicating artery. Patent anterior and middle cerebral arteries, mild luminal irregularity compatible with atherosclerosis. No large vessel occlusion, significant stenosis, contrast extravasation or aneurysm. POSTERIOR CIRCULATION: Patent vertebral arteries, vertebrobasilar junction and basilar artery, as well as main branch vessels. Patent posterior cerebral arteries, mild luminal  irregularity compatible with atherosclerosis. Small LEFT posterior communicating artery present. No large vessel occlusion, significant stenosis, contrast extravasation or aneurysm. VENOUS SINUSES: Major dural venous sinuses are patent though not tailored for evaluation on this angiographic examination. ANATOMIC VARIANTS: Hypoplastic RIGHT A1 segment. DELAYED PHASE: No abnormal intracranial enhancement. MIP images reviewed. IMPRESSION: CTA NECK: 1. No hemodynamically significant stenosis or acute vascular process in the neck. 2. Minimal debris RIGHT mainstem bronchus concerning for aspiration. CTA HEAD: 1. No emergent large vessel occlusion or flow limiting stenosis. 2. Mild intracranial atherosclerosis. Aortic Atherosclerosis (ICD10-I70.0). Electronically Signed   By: CElon AlasM.D.   On: 04/24/2018 02:27   Dg Chest 1 View  Result Date: 04/23/2018 CLINICAL DATA:  Weakness for 1 week. EXAM: CHEST  1 VIEW COMPARISON:  10/29/2017 and prior chest radiographs FINDINGS: This is a low volume film. Cardiomediastinal silhouette is unchanged with fullness of the SUPERIOR mediastinum again noted since 2017. Mild LEFT basilar scarring again identified. There is no evidence of focal airspace disease, pulmonary edema, suspicious pulmonary nodule/mass, pleural effusion, or pneumothorax. No acute bony abnormalities are identified. IMPRESSION: No evidence of acute cardiopulmonary disease. Electronically Signed   By: JMargarette CanadaM.D.   On: 04/23/2018 15:44   Ct Head Wo Contrast  Result Date: 04/23/2018 CLINICAL DATA:  77year old male with altered mental status for 1 week. Weakness and facial droop. EXAM: CT HEAD WITHOUT CONTRAST TECHNIQUE: Contiguous axial images were obtained from the base of the skull through the vertex without intravenous contrast. COMPARISON:  10/27/2017 and prior CTs FINDINGS: Brain: No evidence of acute infarction, hemorrhage, hydrocephalus, extra-axial collection or mass lesion/mass effect.  Mild chronic small-vessel white matter ischemic changes are again identified. Vascular: No hyperdense vessel or unexpected calcification. Skull: No acute abnormality. Small sclerotic foci within the visualized calvarium are unchanged from 2017. Sinuses/Orbits: No acute finding. Other: None. IMPRESSION: 1. No evidence of acute intracranial abnormality 2. Mild  chronic small-vessel white matter ischemic changes. Electronically Signed   By: Margarette Canada M.D.   On: 04/23/2018 16:02   Ct Angio Neck W Or Wo Contrast  Result Date: 04/24/2018 CLINICAL DATA:  Altered mental status. Weakness and facial droop for 1 week. Urosepsis. History of schizophrenia, dementia with neurosyphilis, prostate cancer. EXAM: CT ANGIOGRAPHY HEAD AND NECK TECHNIQUE: Multidetector CT imaging of the head and neck was performed using the standard protocol during bolus administration of intravenous contrast. Multiplanar CT image reconstructions and MIPs were obtained to evaluate the vascular anatomy. Carotid stenosis measurements (when applicable) are obtained utilizing NASCET criteria, using the distal internal carotid diameter as the denominator. CONTRAST:  70m ISOVUE-370 IOPAMIDOL (ISOVUE-370) INJECTION 76% COMPARISON:  CT angiogram head and neck January 28, 2017 FINDINGS: CTA NECK FINDINGS: AORTIC ARCH: Normal appearance of the thoracic arch, normal branch pattern. Mild calcific atherosclerosis aortic arch. The origins of the innominate, left Common carotid artery and subclavian artery are widely patent. RIGHT CAROTID SYSTEM: Common carotid artery is patent. Normal appearance of the carotid bifurcation without hemodynamically significant stenosis by NASCET criteria. Normal appearance of the internal carotid artery, developmentally smaller. LEFT CAROTID SYSTEM: Common carotid artery is patent. Normal appearance of the carotid bifurcation without hemodynamically significant stenosis by NASCET criteria. Normal appearance of the internal carotid  artery. VERTEBRAL ARTERIES:Proximal LEFT vertebral artery obscured by streak artifact from venous contamination. Symmetric patent vertebral artery's without luminal irregularity. SKELETON: No acute osseous process though bone windows have not been submitted. Severe degenerative change of the cervical spine. OTHER NECK: Soft tissues of the neck are nonacute though, not tailored for evaluation. UPPER CHEST: Minimal debris RIGHT mainstem bronchus. CTA HEAD FINDINGS: ANTERIOR CIRCULATION: Patent cervical internal carotid arteries, petrous, cavernous and supra clinoid internal carotid arteries. Patent anterior communicating artery. Patent anterior and middle cerebral arteries, mild luminal irregularity compatible with atherosclerosis. No large vessel occlusion, significant stenosis, contrast extravasation or aneurysm. POSTERIOR CIRCULATION: Patent vertebral arteries, vertebrobasilar junction and basilar artery, as well as main branch vessels. Patent posterior cerebral arteries, mild luminal irregularity compatible with atherosclerosis. Small LEFT posterior communicating artery present. No large vessel occlusion, significant stenosis, contrast extravasation or aneurysm. VENOUS SINUSES: Major dural venous sinuses are patent though not tailored for evaluation on this angiographic examination. ANATOMIC VARIANTS: Hypoplastic RIGHT A1 segment. DELAYED PHASE: No abnormal intracranial enhancement. MIP images reviewed. IMPRESSION: CTA NECK: 1. No hemodynamically significant stenosis or acute vascular process in the neck. 2. Minimal debris RIGHT mainstem bronchus concerning for aspiration. CTA HEAD: 1. No emergent large vessel occlusion or flow limiting stenosis. 2. Mild intracranial atherosclerosis. Aortic Atherosclerosis (ICD10-I70.0). Electronically Signed   By: CElon AlasM.D.   On: 04/24/2018 02:27   Mr Brain Wo Contrast  Result Date: 04/24/2018 CLINICAL DATA:  Motor aphasia.  Right-sided hemiparesis. EXAM: MRI  HEAD WITHOUT CONTRAST MRA HEAD WITHOUT CONTRAST TECHNIQUE: Multiplanar, multiecho pulse sequences of the brain and surrounding structures were obtained without intravenous contrast. Angiographic images of the head were obtained using MRA technique without contrast. COMPARISON:  CT same day.  MRI 05/06/2017. FINDINGS: MRI HEAD FINDINGS Brain: Diffusion imaging does not show any acute or subacute infarction. The brainstem and cerebellum are normal. Cerebral hemispheres show generalized atrophy with a few old small vessel insults within the deep white matter. No cortical or large vessel territory infarction. No mass lesion, hemorrhage, hydrocephalus or extra-axial collection. Vascular: Major vessels at the base of the brain show flow. Skull and upper cervical spine: Negative Sinuses/Orbits: Clear/normal Other: None MRA HEAD  FINDINGS Both internal carotid arteries are widely patent through the siphon region. Mild atherosclerotic irregularity but no flow limiting stenosis. Right internal carotid artery supplies only the right middle cerebral artery territory. Left ICA supplies the left MCA and both anterior cerebral artery territories. Distal branch vessels show some atherosclerotic irregularity. Both vertebral arteries widely patent to the basilar. No basilar stenosis. Posterior circulation branch vessels appear normal, except for mild atherosclerotic irregularity of the more distal PCA branches. IMPRESSION: No acute intracranial finding. Normal for age, with mild small vessel change of the cerebral hemispheric deep white matter. MR angiography does not show any large or medium vessel occlusion or correctable proximal stenosis. 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 Mild atherosclerotic irregularity of the more distal branch vessels. Electronically Signed   By: Nelson Chimes M.D.   On: 04/24/2018 09:12   Mr  Jodene Nam Head/brain MW Cm  Result Date: 04/24/2018 CLINICAL DATA:  Motor aphasia.  Right-sided hemiparesis. EXAM: MRI HEAD WITHOUT CONTRAST MRA HEAD WITHOUT CONTRAST TECHNIQUE: Multiplanar, multiecho pulse sequences of the brain and surrounding structures were obtained without intravenous contrast. Angiographic images of the head were obtained using MRA technique without contrast. COMPARISON:  CT same day.  MRI 05/06/2017. FINDINGS: MRI HEAD FINDINGS Brain: Diffusion imaging does not show any acute or subacute infarction. The brainstem and cerebellum are normal. Cerebral hemispheres show generalized atrophy with a few old small vessel insults within the deep white matter. No cortical or large vessel territory infarction. No mass lesion, hemorrhage, hydrocephalus or extra-axial collection. Vascular: Major vessels at the base of the brain show flow. Skull and upper cervical spine: Negative Sinuses/Orbits: Clear/normal Other: None MRA HEAD FINDINGS Both internal carotid arteries are widely patent through the siphon region. Mild atherosclerotic irregularity but no flow limiting stenosis. Right internal carotid artery supplies only the right middle cerebral artery territory. Left ICA supplies the left MCA and both anterior cerebral artery territories. Distal branch vessels show some atherosclerotic irregularity. Both vertebral arteries widely patent to the basilar. No basilar stenosis. Posterior circulation branch vessels appear normal, except for mild atherosclerotic irregularity of the more distal PCA branches. IMPRESSION: No acute intracranial finding. Normal for age, with mild small vessel change of the cerebral hemispheric deep white matter. MR angiography does not show any large or medium vessel occlusion or correctable proximal stenosis. 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 Mild atherosclerotic  irregularity of the more distal branch vessels. Electronically Signed   By: Nelson Chimes M.D.   On: 04/24/2018 09:12    Lab Data:  CBC: Recent Labs  Lab 04/23/18 1554 04/24/18 0436  WBC 15.6* 11.4*  NEUTROABS 14.0  --   HGB 15.9 14.0  HCT 48.1 44.7  MCV 85.4 88.2  PLT 82* 77*   Basic Metabolic Panel: Recent Labs  Lab 04/23/18 1554 04/24/18 0127 04/24/18 0436  NA 137 140 139  K 5.2* 4.5 4.8  CL 100* 108 107  CO2 _0 GLUCOSE 110* 95 92  BUN 21* 25* 26*  CREATININE 1.58* 1.45* 1.34*  CALCIUM 11.8* 10.8* 10.9*   GFR: Estimated Creatinine Clearance: 57.9 mL/min (A) (by C-G formula based on SCr of 1.34 mg/dL (H)). Liver Function Tests: Recent Labs  Lab 04/23/18 1554 04/24/18 0436  AST 36 29  ALT 36 31  ALKPHOS 45 40  BILITOT  0.8 0.8  PROT 6.8 5.9*  ALBUMIN 3.2* 2.6*   No results for input(s): LIPASE, AMYLASE in the last 168 hours. No results for input(s): AMMONIA in the last 168 hours. Coagulation Profile: Recent Labs  Lab 04/23/18 1554  INR 1.23   Cardiac Enzymes: Recent Labs  Lab 04/23/18 1554  CKTOTAL 65  TROPONINI <0.03   BNP (last 3 results) No results for input(s): PROBNP in the last 8760 hours. HbA1C: Recent Labs    04/24/18 0436  HGBA1C 5.3   CBG: Recent Labs  Lab 04/23/18 1445  GLUCAP 118*   Lipid Profile: Recent Labs    04/24/18 0436  CHOL 85  HDL 43  LDLCALC 32  TRIG 49  CHOLHDL 2.0   Thyroid Function Tests: No results for input(s): TSH, T4TOTAL, FREET4, T3FREE, THYROIDAB in the last 72 hours. Anemia Panel: No results for input(s): VITAMINB12, FOLATE, FERRITIN, TIBC, IRON, RETICCTPCT in the last 72 hours. Urine analysis:    Component Value Date/Time   COLORURINE YELLOW 04/23/2018 1540   APPEARANCEUR TURBID (A) 04/23/2018 1540   LABSPEC 1.021 04/23/2018 1540   PHURINE 5.0 04/23/2018 1540   GLUCOSEU 50 (A) 04/23/2018 1540   HGBUR LARGE (A) 04/23/2018 1540   BILIRUBINUR NEGATIVE 04/23/2018 1540   KETONESUR  NEGATIVE 04/23/2018 1540   PROTEINUR 100 (A) 04/23/2018 1540   NITRITE NEGATIVE 04/23/2018 1540   LEUKOCYTESUR MODERATE (A) 04/23/2018 1540     Ripudeep Rai M.D. [facility] 04/24/2018, 1:52 PM  Pager: [phone] Between 7am to 7pm - call Pager - [phone]  After 7pm go to [email or web address] - password [email or web address]  Call night coverage person covering after 7pm

## 2018-04-24 NOTE — Progress Notes (Signed)
  Echocardiogram 2D Echocardiogram has been performed.  Henry Smith Henry Smith 04/24/2018, 2:28 PM

## 2018-04-24 NOTE — Progress Notes (Signed)
Dr. Olevia Bowens made aware of pts expressive aphasia- not following commands at times and pronator drift positive. Dr. Olevia Bowens in room to talk to pt. After assessment he stated he was going to order CT angio of head and neck and ordered tele neurology. Will continue to monitor pt

## 2018-04-24 NOTE — Progress Notes (Signed)
Night shift ICU/SDU coverage note.  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  Per nursing home staff, the patient is usually very talkative.  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  Most recent vital signs temperature 99.1 F, pulse 108, respirations 29, O2 sat 97% on room air and blood pressure 131/75 mmHg.  Neck is supple, no JVD.  Lungs are clear.  Cardiovascular, mildly tachycardic at 103 bpm, abdomen is soft nontender.  Code stroke was called.  Telemetry neurology consultation was placed.  Dr. Derrill Kay is under the impression of the patient is encephalopathic due to sepsis.  However physical exam was very very limited and no focal.  However, he recommended inpatient CVA work up and inpatient neuro-consult.  There is no MRI capability or neurology service during the weekend [facility].  Arrangements have been made to transfer the patient to [facility] for MRI/MRA and inpatient tele-neurology service.  Tennis Must, MD

## 2018-04-25 ENCOUNTER — Inpatient Hospital Stay (HOSPITAL_COMMUNITY): Payer: Medicare Other

## 2018-04-25 LAB — GLUCOSE, CAPILLARY
GLUCOSE-CAPILLARY: 105 mg/dL — AB (ref 65–99)
Glucose-Capillary: 100 mg/dL — ABNORMAL HIGH (ref 65–99)

## 2018-04-25 LAB — BASIC METABOLIC PANEL
Anion gap: 6 (ref 5–15)
BUN: 25 mg/dL — AB (ref 6–20)
CHLORIDE: 113 mmol/L — AB (ref 101–111)
CO2: 25 mmol/L (ref 22–32)
CREATININE: 1.06 mg/dL (ref 0.61–1.24)
Calcium: 11.4 mg/dL — ABNORMAL HIGH (ref 8.9–10.3)
GFR calc Af Amer: >60 mL/min (ref >60)
GFR calc non Af Amer: >60 mL/min (ref >60)
GLUCOSE: 104 mg/dL — AB (ref 65–99)
Potassium: 4.1 mmol/L (ref 3.5–5.1)
Sodium: 144 mmol/L (ref 135–145)

## 2018-04-25 LAB — URINE CULTURE: Culture: >=100000 — AB

## 2018-04-25 NOTE — Plan of Care (Signed)
  Problem: Education: Goal: Knowledge of General Education information will improve Outcome: Progressing   Problem: Health Behavior/Discharge Planning: Goal: Ability to manage health-related needs will improve Outcome: Progressing   Problem: Clinical Measurements: Goal: Ability to maintain clinical measurements within normal limits will improve Outcome: Progressing Goal: Will remain free from infection Outcome: Progressing Goal: Diagnostic test results will improve Outcome: Progressing Goal: Respiratory complications will improve Outcome: Progressing Goal: Cardiovascular complication will be avoided Outcome: Progressing   Problem: Activity: Goal: Risk for activity intolerance will decrease Outcome: Progressing   Problem: Nutrition: Goal: Adequate nutrition will be maintained Outcome: Progressing   Problem: Coping: Goal: Level of anxiety will decrease Outcome: Progressing   Problem: Elimination: Goal: Will not experience complications related to bowel motility Outcome: Progressing Goal: Will not experience complications related to urinary retention Outcome: Progressing   Problem: Pain Managment: Goal: General experience of comfort will improve Outcome: Progressing   Problem: Safety: Goal: Ability to remain free from injury will improve Outcome: Progressing   Problem: Skin Integrity: Goal: Risk for impaired skin integrity will decrease Outcome: Progressing   Problem: Fluid Volume: Goal: Hemodynamic stability will improve Outcome: Progressing   Problem: Clinical Measurements: Goal: Diagnostic test results will improve Outcome: Progressing Goal: Signs and symptoms of infection will decrease Outcome: Progressing   Problem: Clinical Measurements: Goal: Diagnostic test results will improve Outcome: Progressing Goal: Signs and symptoms of infection will decrease Outcome: Progressing   Problem: Respiratory: Goal: Ability to maintain adequate ventilation will  improve Outcome: Progressing   Problem: Education: Goal: Knowledge of disease or condition will improve Outcome: Progressing Goal: Knowledge of secondary prevention will improve Outcome: Progressing Goal: Knowledge of patient specific risk factors addressed and post discharge goals established will improve Outcome: Progressing   Problem: Coping: Goal: Will verbalize positive feelings about self Outcome: Progressing Goal: Will identify appropriate support needs Outcome: Progressing   Problem: Health Behavior/Discharge Planning: Goal: Ability to manage health-related needs will improve Outcome: Progressing   Problem: Self-Care: Goal: Ability to participate in self-care as condition permits will improve Outcome: Progressing Goal: Verbalization of feelings and concerns over difficulty with self-care will improve Outcome: Progressing Goal: Ability to communicate needs accurately will improve Outcome: Progressing   Problem: Nutrition: Goal: Risk of aspiration will decrease Outcome: Progressing Goal: Dietary intake will improve Outcome: Progressing   Problem: Ischemic Stroke/TIA Tissue Perfusion: Goal: Complications of ischemic stroke/TIA will be minimized Outcome: Progressing
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
## 2018-04-25 NOTE — Clinical Social Work Note (Signed)
Clinical Social Work Assessment  Patient Details  Name: Henry Smith MRN: [id number] Date of Birth: 1941-05-02  Date of referral:  04/25/18               Reason for consult:  Facility Placement                Permission sought to share information with:  Facility Sport and exercise psychologist, Family Supports Permission granted to share information::  Yes, Verbal Permission Granted  Name::     Cabin crew::  [place]  Relationship::  Legal guardian  Contact Information:     Housing/Transportation Living arrangements for the past 2 months:  [place] of Information:  Guardian Patient Interpreter Needed:  None Criminal Activity/Legal Involvement Pertinent to Current Situation/Hospitalization:  No - Comment as needed Significant Relationships:  Warehouse manager Lives with:  Self, Facility Resident Do you feel safe going back to the place where you live?  Yes Need for family participation in patient care:  Yes (Comment)  Care giving concerns:  Patient from [facility] in [place], legal guardian has no concerns about care received.   Social Worker assessment / plan:  CSW spoke with patient's guardian over the phone. CSW confirmed plan to return to [facility], and that patient will need to transition into rehab due to his decreased functional ability at this time. CSW to follow to coordinate return to facility when stable.  Employment status:  Retired Forensic scientist:  Medicare PT Recommendations:  [place] / Referral to community resources:  [place]  Patient/Family's Response to care:  Patient's guardian agreeable for patient to return to SNF.  Patient/Family's Understanding of and Emotional Response to Diagnosis, Current Treatment, and Prognosis:  Patient's guardian discussed how she had been planning on going to visit the patient before all of this happened and she didn't make it because of her own health issues, and now  she's feeling guilty because of how poorly the patient is doing at this time. Patient's guardian discussed how usually the patient is ambulatory and talkative, he's in more of the assisted living portion of the facility and was able to take care of his needs for the most part. Patient's guardian discussed how he is definitely confused at baseline, he never remembers who she is, but that he's pleasant and flirts with the ladies at the SNF.  Emotional Assessment Appearance:  Appears stated age Attitude/Demeanor/Rapport:  Unable to Assess Affect (typically observed):  Unable to Assess Orientation:  Oriented to Self Alcohol / Substance use:  Not Applicable Psych involvement (Current and /or in the community):  No (Comment)  Discharge Needs  Concerns to be addressed:  Care Coordination Readmission within the last 30 days:  No Current discharge risk:  Dependent with Mobility, Cognitively Impaired Barriers to Discharge:  Continued Medical Work up, [place], [place] 04/25/2018, 10:13 PM

## 2018-04-25 NOTE — Progress Notes (Signed)
[facility]                                                                              Patient Demographics  Henry Smith, is a 77 y.o. male, DOB - 1941/04/06, BXI:[id number]  Admit date - 04/23/2018   Admitting Physician Oswald Hillock, MD  Outpatient Primary MD for the patient is Caprice Renshaw, MD  Outpatient specialists:   LOS - 2  days   Medical records reviewed and are as summarized below:    Chief Complaint  Patient presents with  . Altered Mental Status       Brief summary   Henry Smith  is a 77 y.o. male, with history of schizophrenia, dementia associated with neurosyphilis, prostate cancer was brought to hospital with altered mental status.  Patient was initially  Nonverbal.  As per ED notes patient has been nonverbal drooping, right-sided facial droop not able to walk as per nursing home staff thought that has "schizophrenia might be acting up".  Patient was sent to ED for further evaluation.  Patient was found to have UTI, lactic acidosis, creatinine 1.5, admitted for further evaluation.  Overnight was noted to have motor aphasia with worsening right-sided hemiparesis, patient was transferred to [facility] for MRI brain to rule out CVA.  Assessment & Plan    Principal Problem:   Sepsis ([place]) secondary to UTI (urinary tract infection), Proteus bacteremia -Patient met sepsis criteria at the time of admission due to lactic acidosis, UTI, acute kidney injury, febrile, tachycardia, tachypnea -Urine cultures and blood cultures showing Proteus mirabilis, continue IV Rocephin  Active Problems:   Schizoaffective disorder, bipolar type (HCC) -Continue Depakote, olanzapine    Acute metabolic encephalopathy -Likely due to #1, however there was concern for possible stroke hence patient was transferred to [facility]. - MRI of the brain showed no acute stroke     Lactic acidosis,  AKI (acute kidney injury) ([place]) - Continue IV fluid hydration, baseline  creatinine 0.8.  Presented with creatinine of 1.58 with lactic acidosis 5.2 -Improving, creatinine 1.0  Dysphagia -Has history of dementia, schizophrenia, swallow evaluation done, recommended n.p.o. status due to high risk of aspiration.  MBS to follow. -Continue D5 half-normal at 60 cc an hour  Code Status; full  DVT Prophylaxis:  Lovenox  Family Communication: No family member at bedside   Disposition Plan:   Time Spent in minutes   35 minutes  Procedures:  MRI brain  Consultants:   Neurology  Antimicrobials:  IV aztreonam 5/11  Medications  Scheduled Meds: . amantadine  100 mg Oral BID  . gabapentin  300 mg Oral TID  . latanoprost  1 drop Both Eyes QHS   Continuous Infusions: . cefTRIAXone (ROCEPHIN)  IV Stopped (04/24/18 1758)  . dextrose 5 % and 0.45% NaCl 60 mL/hr at 04/24/18 1710  . valproate sodium Stopped (04/24/18 2232)   And  . valproate sodium 250 mg (04/25/18 0629)   PRN Meds:.acetaminophen **OR** acetaminophen (TYLENOL) oral liquid 160 mg/5 mL **OR** acetaminophen, acetaminophen **OR** acetaminophen   Antibiotics   Anti-infectives (From admission, onward)   Start     Dose/Rate Route Frequency Ordered Stop  04/24/18 1800  cefTRIAXone (ROCEPHIN) 2 g in sodium chloride 0.9 % 100 mL IVPB     2 g 200 mL/hr over 30 Minutes Intravenous Every 24 hours 04/24/18 1323     04/24/18 0400  aztreonam (AZACTAM) 1 g in sodium chloride 0.9 % 100 mL IVPB  Status:  Discontinued     1 g 200 mL/hr over 30 Minutes Intravenous Every 8 hours 04/23/18 1826 04/24/18 1323   04/23/18 1715  levofloxacin (LEVAQUIN) IVPB 750 mg     750 mg 100 mL/hr over 90 Minutes Intravenous  Once 04/23/18 1703 04/23/18 1909   04/23/18 1715  aztreonam (AZACTAM) 2 g in sodium chloride 0.9 % 100 mL IVPB     2 g 200 mL/hr over 30 Minutes Intravenous  Once 04/23/18 1703 04/23/18 1953        Subjective:   Henry Smith was seen and examined today.  Confused, unable to provide and  review of system.  Alert and awake, tracks with eyes when moved to different side of the bed.  However does not cooperate with exam. BP elevated  Objective:   Vitals:   04/25/18 0330 04/25/18 0459 04/25/18 0908 04/25/18 1115  BP:  (!) 154/95 (!) 145/86 (!) 163/72  Pulse:  79 (!) 59 (!) 56  Resp:  '18 20 16  ' Temp: 99.3 F (37.4 C) 98.4 F (36.9 C) 97.8 F (36.6 C) 98.5 F (36.9 C)  TempSrc: Axillary Oral Axillary Axillary  SpO2:  95% 99% 99%  Weight:      Height:        Intake/Output Summary (Last 24 hours) at 04/25/2018 1147 Last data filed at 04/25/2018 1000 Gross per 24 hour  Intake 1403 ml  Output 1900 ml  Net -497 ml     Wt Readings from Last 3 Encounters:  04/24/18 98.6 kg (217 lb 6 oz)  12/24/17 89.4 kg (197 lb)  10/27/17 89.8 kg (197 lb 14.4 oz)     Exam   General: Alert, awake, does not respond to questions  Eyes:   HEENT:    Cardiovascular: S1 S2 auscultated, RRR. No pedal edema b/l  Respiratory: Clear to auscultation bilaterally, no wheezing, rales or rhonchi  Gastrointestinal: Soft, nontender, nondistended, + bowel sounds  Ext: no pedal edema bilaterally  Neuro: unable to assess  Musculoskeletal: No digital cyanosis, clubbing  Skin: No rashes  Psych: alert, awake, does not respond to questions, tracks with his eyes   Data Reviewed:  I have personally reviewed following labs and imaging studies  Micro Results Recent Results (from the past 240 hour(s))  Urine culture     Status: Abnormal   Collection Time: 04/23/18  3:40 PM  Result Value Ref Range Status   Specimen Description   Final    URINE, CATHETERIZED Performed at [facility], [address], [place], [place] [address]    Special Requests   Final    NONE Performed at [facility], [address], [place], [place] [address]    Culture >=100,000 COLONIES/mL PROTEUS MIRABILIS (A)  Final   Report Status 04/25/2018 FINAL  Final   Organism ID, Bacteria PROTEUS MIRABILIS (A)  Final       Susceptibility   Proteus mirabilis - MIC*    AMPICILLIN <=2 SENSITIVE Sensitive     CEFAZOLIN <=4 SENSITIVE Sensitive     CEFTRIAXONE <=1 SENSITIVE Sensitive     CIPROFLOXACIN <=0.25 SENSITIVE Sensitive     GENTAMICIN <=1 SENSITIVE Sensitive     IMIPENEM 1 SENSITIVE Sensitive  NITROFURANTOIN 128 RESISTANT Resistant     TRIMETH/SULFA <=20 SENSITIVE Sensitive     AMPICILLIN/SULBACTAM <=2 SENSITIVE Sensitive     PIP/TAZO <=4 SENSITIVE Sensitive     * >=100,000 COLONIES/mL PROTEUS MIRABILIS  Culture, blood (routine x 2)     Status: Abnormal (Preliminary result)   Collection Time: 04/23/18  5:41 PM  Result Value Ref Range Status   Specimen Description   Final    RIGHT ANTECUBITAL Performed at [facility], [address] [place], [place] [address]    Special Requests   Final    BOTTLES DRAWN AEROBIC AND ANAEROBIC Blood Culture adequate volume Performed at [facility], [address] [place], [place] [address]    Culture  Setup Time   Final    GRAM NEGATIVE RODS IN BOTH AEROBIC AND ANAEROBIC BOTTLES Gram Stain Report Called to,Read Back By and Verified With: NESBITT,R'@0815'  BY MATTHEWS, B 5.12.19 Performed at [facility]    Culture (A)  Final    PROTEUS MIRABILIS SUSCEPTIBILITIES TO FOLLOW Performed at [facility], [place] [address], [place], [place] [address]    Report Status PENDING  Incomplete  Culture, blood (routine x 2)     Status: Abnormal (Preliminary result)   Collection Time: 04/23/18  5:42 PM  Result Value Ref Range Status   Specimen Description   Final    BLOOD LEFT ARM Performed at [facility], [address], [place], [place] [address]    Special Requests   Final    BOTTLES DRAWN AEROBIC ONLY Blood Culture results may not be optimal due to an inadequate volume of blood received in culture bottles Performed at [facility], [address] [place], [place] [address]    Culture  Setup Time   Final    GRAM NEGATIVE RODS AEROBIC BOTTLE ONLY Gram  Stain Report Called to,Read Back By and Verified With: NESBITT,R'@0917'  BY MATTHEWS, B 5.12.19 Performed at [place] (A)  Final    PROTEUS MIRABILIS SUSCEPTIBILITIES TO FOLLOW Performed at [facility], [place] [address] [place], [place] [address]    Report Status PENDING  Incomplete  Blood Culture ID Panel (Reflexed)     Status: Abnormal   Collection Time: 04/23/18  5:42 PM  Result Value Ref Range Status   Enterococcus species NOT DETECTED NOT DETECTED Final   Listeria monocytogenes NOT DETECTED NOT DETECTED Final   Staphylococcus species NOT DETECTED NOT DETECTED Final   Staphylococcus aureus NOT DETECTED NOT DETECTED Final   Streptococcus species NOT DETECTED NOT DETECTED Final   Streptococcus agalactiae NOT DETECTED NOT DETECTED Final   Streptococcus pneumoniae NOT DETECTED NOT DETECTED Final   Streptococcus pyogenes NOT DETECTED NOT DETECTED Final   Acinetobacter baumannii NOT DETECTED NOT DETECTED Final   Enterobacteriaceae species DETECTED (A) NOT DETECTED Final    Comment: Enterobacteriaceae represent a large family of gram-negative bacteria, not a single organism. CRITICAL RESULT CALLED TO, READ BACK BY AND VERIFIED WITH: NITA JOHNSTON PHRAMD AT [facility] AT 2641 ON [id number] BY SJW    Enterobacter cloacae complex NOT DETECTED NOT DETECTED Final   Escherichia coli NOT DETECTED NOT DETECTED Final   Klebsiella oxytoca NOT DETECTED NOT DETECTED Final   Klebsiella pneumoniae NOT DETECTED NOT DETECTED Final   Proteus species DETECTED (A) NOT DETECTED Final    Comment: CRITICAL RESULT CALLED TO, READ BACK BY AND VERIFIED WITH: NICK HAYES PHARMD AT 1245 ON [id number] BY SJW    Serratia marcescens NOT DETECTED NOT  DETECTED Final   Carbapenem resistance NOT DETECTED NOT DETECTED Final   Haemophilus influenzae NOT DETECTED NOT DETECTED Final   Neisseria meningitidis NOT DETECTED NOT DETECTED Final   Pseudomonas aeruginosa NOT DETECTED NOT DETECTED Final   Candida  albicans NOT DETECTED NOT DETECTED Final   Candida glabrata NOT DETECTED NOT DETECTED Final   Candida krusei NOT DETECTED NOT DETECTED Final   Candida parapsilosis NOT DETECTED NOT DETECTED Final   Candida tropicalis NOT DETECTED NOT DETECTED Final    Comment: Performed at [facility], [place] [address] [place], [place] [address]  MRSA PCR Screening     Status: None   Collection Time: 04/23/18  8:06 PM  Result Value Ref Range Status   MRSA by PCR NEGATIVE NEGATIVE Final    Comment:        The GeneXpert MRSA Assay (FDA approved for NASAL specimens only), is one component of a comprehensive MRSA colonization surveillance program. It is not intended to diagnose MRSA infection nor to guide or monitor treatment for MRSA infections. Performed at [facility], [address] [place], [place] [address]     Radiology Reports Ct Angio Head W Or Wo Contrast  Result Date: 04/24/2018 CLINICAL DATA:  Altered mental status. Weakness and facial droop for 1 week. Urosepsis. History of schizophrenia, dementia with neurosyphilis, prostate cancer. EXAM: CT ANGIOGRAPHY HEAD AND NECK TECHNIQUE: Multidetector CT imaging of the head and neck was performed using the standard protocol during bolus administration of intravenous contrast. Multiplanar CT image reconstructions and MIPs were obtained to evaluate the vascular anatomy. Carotid stenosis measurements (when applicable) are obtained utilizing NASCET criteria, using the distal internal carotid diameter as the denominator. CONTRAST:  66m ISOVUE-370 IOPAMIDOL (ISOVUE-370) INJECTION 76% COMPARISON:  CT angiogram head and neck January 28, 2017 FINDINGS: CTA NECK FINDINGS: AORTIC ARCH: Normal appearance of the thoracic arch, normal branch pattern. Mild calcific atherosclerosis aortic arch. The origins of the innominate, left Common carotid artery and subclavian artery are widely patent. RIGHT CAROTID SYSTEM: Common carotid artery is patent. Normal appearance  of the carotid bifurcation without hemodynamically significant stenosis by NASCET criteria. Normal appearance of the internal carotid artery, developmentally smaller. LEFT CAROTID SYSTEM: Common carotid artery is patent. Normal appearance of the carotid bifurcation without hemodynamically significant stenosis by NASCET criteria. Normal appearance of the internal carotid artery. VERTEBRAL ARTERIES:Proximal LEFT vertebral artery obscured by streak artifact from venous contamination. Symmetric patent vertebral artery's without luminal irregularity. SKELETON: No acute osseous process though bone windows have not been submitted. Severe degenerative change of the cervical spine. OTHER NECK: Soft tissues of the neck are nonacute though, not tailored for evaluation. UPPER CHEST: Minimal debris RIGHT mainstem bronchus. CTA HEAD FINDINGS: ANTERIOR CIRCULATION: Patent cervical internal carotid arteries, petrous, cavernous and supra clinoid internal carotid arteries. Patent anterior communicating artery. Patent anterior and middle cerebral arteries, mild luminal irregularity compatible with atherosclerosis. No large vessel occlusion, significant stenosis, contrast extravasation or aneurysm. POSTERIOR CIRCULATION: Patent vertebral arteries, vertebrobasilar junction and basilar artery, as well as main branch vessels. Patent posterior cerebral arteries, mild luminal irregularity compatible with atherosclerosis. Small LEFT posterior communicating artery present. No large vessel occlusion, significant stenosis, contrast extravasation or aneurysm. VENOUS SINUSES: Major dural venous sinuses are patent though not tailored for evaluation on this angiographic examination. ANATOMIC VARIANTS: Hypoplastic RIGHT A1 segment. DELAYED PHASE: No abnormal intracranial enhancement. MIP images reviewed. IMPRESSION: CTA NECK: 1. No hemodynamically significant stenosis or acute vascular process in the neck. 2. Minimal debris RIGHT mainstem bronchus  concerning for aspiration. CTA HEAD: 1. No emergent large vessel occlusion or flow limiting stenosis. 2. Mild intracranial atherosclerosis. Aortic Atherosclerosis (ICD10-I70.0). Electronically Signed   By: Elon Alas M.D.   On: 04/24/2018 02:27   Dg Chest 1 View  Result Date: 04/23/2018 CLINICAL DATA:  Weakness for 1 week. EXAM: CHEST  1 VIEW COMPARISON:  10/29/2017 and prior chest radiographs FINDINGS: This is a low volume film. Cardiomediastinal silhouette is unchanged with fullness of the SUPERIOR mediastinum again noted since 2017. Mild LEFT basilar scarring again identified. There is no evidence of focal airspace disease, pulmonary edema, suspicious pulmonary nodule/mass, pleural effusion, or pneumothorax. No acute bony abnormalities are identified. IMPRESSION: No evidence of acute cardiopulmonary disease. Electronically Signed   By: Margarette Canada M.D.   On: 04/23/2018 15:44   Ct Head Wo Contrast  Result Date: 04/23/2018 CLINICAL DATA:  77 year old male with altered mental status for 1 week. Weakness and facial droop. EXAM: CT HEAD WITHOUT CONTRAST TECHNIQUE: Contiguous axial images were obtained from the base of the skull through the vertex without intravenous contrast. COMPARISON:  10/27/2017 and prior CTs FINDINGS: Brain: No evidence of acute infarction, hemorrhage, hydrocephalus, extra-axial collection or mass lesion/mass effect. Mild chronic small-vessel white matter ischemic changes are again identified. Vascular: No hyperdense vessel or unexpected calcification. Skull: No acute abnormality. Small sclerotic foci within the visualized calvarium are unchanged from 2017. Sinuses/Orbits: No acute finding. Other: None. IMPRESSION: 1. No evidence of acute intracranial abnormality 2. Mild chronic small-vessel white matter ischemic changes. Electronically Signed   By: Margarette Canada M.D.   On: 04/23/2018 16:02   Ct Angio Neck W Or Wo Contrast  Result Date: 04/24/2018 CLINICAL DATA:  Altered mental  status. Weakness and facial droop for 1 week. Urosepsis. History of schizophrenia, dementia with neurosyphilis, prostate cancer. EXAM: CT ANGIOGRAPHY HEAD AND NECK TECHNIQUE: Multidetector CT imaging of the head and neck was performed using the standard protocol during bolus administration of intravenous contrast. Multiplanar CT image reconstructions and MIPs were obtained to evaluate the vascular anatomy. Carotid stenosis measurements (when applicable) are obtained utilizing NASCET criteria, using the distal internal carotid diameter as the denominator. CONTRAST:  68m ISOVUE-370 IOPAMIDOL (ISOVUE-370) INJECTION 76% COMPARISON:  CT angiogram head and neck January 28, 2017 FINDINGS: CTA NECK FINDINGS: AORTIC ARCH: Normal appearance of the thoracic arch, normal branch pattern. Mild calcific atherosclerosis aortic arch. The origins of the innominate, left Common carotid artery and subclavian artery are widely patent. RIGHT CAROTID SYSTEM: Common carotid artery is patent. Normal appearance of the carotid bifurcation without hemodynamically significant stenosis by NASCET criteria. Normal appearance of the internal carotid artery, developmentally smaller. LEFT CAROTID SYSTEM: Common carotid artery is patent. Normal appearance of the carotid bifurcation without hemodynamically significant stenosis by NASCET criteria. Normal appearance of the internal carotid artery. VERTEBRAL ARTERIES:Proximal LEFT vertebral artery obscured by streak artifact from venous contamination. Symmetric patent vertebral artery's without luminal irregularity. SKELETON: No acute osseous process though bone windows have not been submitted. Severe degenerative change of the cervical spine. OTHER NECK: Soft tissues of the neck are nonacute though, not tailored for evaluation. UPPER CHEST: Minimal debris RIGHT mainstem bronchus. CTA HEAD FINDINGS: ANTERIOR CIRCULATION: Patent cervical internal carotid arteries, petrous, cavernous and supra clinoid  internal carotid arteries. Patent anterior communicating artery. Patent anterior and middle cerebral arteries, mild luminal irregularity compatible with atherosclerosis. No large vessel occlusion, significant stenosis, contrast extravasation or aneurysm. POSTERIOR CIRCULATION: Patent vertebral arteries, vertebrobasilar junction and basilar artery, as well as main branch vessels.  Patent posterior cerebral arteries, mild luminal irregularity compatible with atherosclerosis. Small LEFT posterior communicating artery present. No large vessel occlusion, significant stenosis, contrast extravasation or aneurysm. VENOUS SINUSES: Major dural venous sinuses are patent though not tailored for evaluation on this angiographic examination. ANATOMIC VARIANTS: Hypoplastic RIGHT A1 segment. DELAYED PHASE: No abnormal intracranial enhancement. MIP images reviewed. IMPRESSION: CTA NECK: 1. No hemodynamically significant stenosis or acute vascular process in the neck. 2. Minimal debris RIGHT mainstem bronchus concerning for aspiration. CTA HEAD: 1. No emergent large vessel occlusion or flow limiting stenosis. 2. Mild intracranial atherosclerosis. Aortic Atherosclerosis (ICD10-I70.0). Electronically Signed   By: Elon Alas M.D.   On: 04/24/2018 02:27   Mr Brain Wo Contrast  Result Date: 04/24/2018 CLINICAL DATA:  Motor aphasia.  Right-sided hemiparesis. EXAM: MRI HEAD WITHOUT CONTRAST MRA HEAD WITHOUT CONTRAST TECHNIQUE: Multiplanar, multiecho pulse sequences of the brain and surrounding structures were obtained without intravenous contrast. Angiographic images of the head were obtained using MRA technique without contrast. COMPARISON:  CT same day.  MRI 05/06/2017. FINDINGS: MRI HEAD FINDINGS Brain: Diffusion imaging does not show any acute or subacute infarction. The brainstem and cerebellum are normal. Cerebral hemispheres show generalized atrophy with a few old small vessel insults within the deep white matter. No  cortical or large vessel territory infarction. No mass lesion, hemorrhage, hydrocephalus or extra-axial collection. Vascular: Major vessels at the base of the brain show flow. Skull and upper cervical spine: Negative Sinuses/Orbits: Clear/normal Other: None MRA HEAD FINDINGS Both internal carotid arteries are widely patent through the siphon region. Mild atherosclerotic irregularity but no flow limiting stenosis. Right internal carotid artery supplies only the right middle cerebral artery territory. Left ICA supplies the left MCA and both anterior cerebral artery territories. Distal branch vessels show some atherosclerotic irregularity. Both vertebral arteries widely patent to the basilar. No basilar stenosis. Posterior circulation branch vessels appear normal, except for mild atherosclerotic irregularity of the more distal PCA branches. IMPRESSION: No acute intracranial finding. Normal for age, with mild small vessel change of the cerebral hemispheric deep white matter. MR angiography does not show any large or medium vessel occlusion or correctable proximal stenosis. 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 Mild atherosclerotic irregularity of the more distal branch vessels. Electronically Signed   By: Nelson Chimes M.D.   On: 04/24/2018 09:12   Mr Jodene Nam Head/brain IO Cm  Result Date: 04/24/2018 CLINICAL DATA:  Motor aphasia.  Right-sided hemiparesis. EXAM: MRI HEAD WITHOUT CONTRAST MRA HEAD WITHOUT CONTRAST TECHNIQUE: Multiplanar, multiecho pulse sequences of the brain and surrounding structures were obtained without intravenous contrast. Angiographic images of the head were obtained using MRA technique without contrast. COMPARISON:  CT same day.  MRI 05/06/2017. FINDINGS: MRI HEAD FINDINGS Brain: Diffusion imaging does not show any acute or subacute infarction. The brainstem and cerebellum are normal.  Cerebral hemispheres show generalized atrophy with a few old small vessel insults within the deep white matter. No cortical or large vessel territory infarction. No mass lesion, hemorrhage, hydrocephalus or extra-axial collection. Vascular: Major vessels at the base of the brain show flow. Skull and upper cervical spine: Negative Sinuses/Orbits: Clear/normal Other: None MRA HEAD FINDINGS Both internal carotid arteries are widely patent through the siphon region. Mild atherosclerotic irregularity but no flow limiting stenosis. Right internal carotid artery supplies only the right middle cerebral artery territory. Left ICA supplies the left MCA and both anterior cerebral artery territories. Distal  branch vessels show some atherosclerotic irregularity. Both vertebral arteries widely patent to the basilar. No basilar stenosis. Posterior circulation branch vessels appear normal, except for mild atherosclerotic irregularity of the more distal PCA branches. IMPRESSION: No acute intracranial finding. Normal for age, with mild small vessel change of the cerebral hemispheric deep white matter. MR angiography does not show any large or medium vessel occlusion or correctable proximal stenosis. 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 Mild atherosclerotic irregularity of the more distal branch vessels. Electronically Signed   By: Nelson Chimes M.D.   On: 04/24/2018 09:12    Lab Data:  CBC: Recent Labs  Lab 04/23/18 1554 04/24/18 0436  WBC 15.6* 11.4*  NEUTROABS 14.0  --   HGB 15.9 14.0  HCT 48.1 44.7  MCV 85.4 88.2  PLT 82* 77*   Basic Metabolic Panel: Recent Labs  Lab 04/23/18 1554 04/24/18 0127 04/24/18 0436 04/25/18 0440  NA 137 140 139 144  K 5.2* 4.5 4.8 4.1  CL 100* 108 107 113*  CO2 '26 26 26 25  ' GLUCOSE 110* 95 92 104*  BUN 21* 25* 26* 25*  CREATININE 1.58* 1.45* 1.34* 1.06  CALCIUM  11.8* 10.8* 10.9* 11.4*   GFR: Estimated Creatinine Clearance: 73.2 mL/min (by C-G formula based on SCr of 1.06 mg/dL). Liver Function Tests: Recent Labs  Lab 04/23/18 1554 04/24/18 0436  AST 36 29  ALT 36 31  ALKPHOS 45 40  BILITOT 0.8 0.8  PROT 6.8 5.9*  ALBUMIN 3.2* 2.6*   No results for input(s): LIPASE, AMYLASE in the last 168 hours. No results for input(s): AMMONIA in the last 168 hours. Coagulation Profile: Recent Labs  Lab 04/23/18 1554  INR 1.23   Cardiac Enzymes: Recent Labs  Lab 04/23/18 1554  CKTOTAL 16  TROPONINI <0.03   BNP (last 3 results) No results for input(s): PROBNP in the last 8760 hours. HbA1C: Recent Labs    04/24/18 0436  HGBA1C 5.3   CBG: Recent Labs  Lab 04/23/18 1445 04/24/18 1604  GLUCAP 118* 71   Lipid Profile: Recent Labs    04/24/18 0436  CHOL 85  HDL 43  LDLCALC 32  TRIG 49  CHOLHDL 2.0   Thyroid Function Tests: No results for input(s): TSH, T4TOTAL, FREET4, T3FREE, THYROIDAB in the last 72 hours. Anemia Panel: No results for input(s): VITAMINB12, FOLATE, FERRITIN, TIBC, IRON, RETICCTPCT in the last 72 hours. Urine analysis:    Component Value Date/Time   COLORURINE YELLOW 04/23/2018 1540   APPEARANCEUR TURBID (A) 04/23/2018 1540   LABSPEC 1.021 04/23/2018 1540   PHURINE 5.0 04/23/2018 1540   GLUCOSEU 50 (A) 04/23/2018 1540   HGBUR LARGE (A) 04/23/2018 1540   BILIRUBINUR NEGATIVE 04/23/2018 1540   KETONESUR NEGATIVE 04/23/2018 1540   PROTEINUR 100 (A) 04/23/2018 1540   NITRITE NEGATIVE 04/23/2018 1540   LEUKOCYTESUR MODERATE (A) 04/23/2018 1540     Cynthea Zachman M.D. [facility] 04/25/2018, 11:47 AM  Pager: [phone] Between 7am to 7pm - call Pager - [phone]  After 7pm go to [email or web address] - password [email or web address]  Call night coverage person covering after 7pm
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
## 2018-04-26 LAB — CULTURE, BLOOD (ROUTINE X 2): SPECIAL REQUESTS: ADEQUATE

## 2018-04-26 LAB — GLUCOSE, CAPILLARY
GLUCOSE-CAPILLARY: 81 mg/dL (ref 65–99)
GLUCOSE-CAPILLARY: 89 mg/dL (ref 65–99)
Glucose-Capillary: 85 mg/dL (ref 65–99)
Glucose-Capillary: 90 mg/dL (ref 65–99)
Glucose-Capillary: 93 mg/dL (ref 65–99)
Glucose-Capillary: 96 mg/dL (ref 65–99)

## 2018-04-26 MED ORDER — HYDRALAZINE HCL 20 MG/ML IJ SOLN
10.0000 mg | Freq: Four times a day (QID) | INTRAMUSCULAR | Status: DC
  Administered 2018-04-26 – 2018-05-13 (×65): 10 mg via INTRAVENOUS
  Filled 2018-04-26 (×66): qty 1

## 2018-04-26 NOTE — Progress Notes (Signed)
Pt refusing oral care, clamping mouth shut when attempting to swab. RN explained need for oral care, no evidence of learning.
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
## 2018-04-26 NOTE — Progress Notes (Signed)
Physical Therapy Treatment Patient Details Name: Henry Smith MRN: [id number] DOB: 01-29-1941 Today's Date: 04/26/2018    History of Present Illness Pt is a 77 y.o. male admitted 04/23/18 with AMS, including non-verbal, not walking and R-side facial droop. Worked up for encephalopathy secondary to sepsis due to UTI, AKI. Head CT and MRI show no acute intracranial finding; mild small vessel change normal for age. PMH includes dementia, schizophrenia, prostate CA.    PT Comments    Patient seen for progression of mobility and tolerated session well. Pt grossly non verbal throughout session however pt did respond occasionally to questions. Pt able to state first name and when asked if he is in pain pt replied "a little" but unable to communicate location of pain.  Pt required max A +2 for bed mobility and lateral scoot transfer EOB to recliner. Continue to progress as tolerated with anticipated d/c to SNF for further skilled PT services.    Follow Up Recommendations  SNF;Supervision/Assistance - 24 hour     Equipment Recommendations  (TBD)    Recommendations for Other Services       Precautions / Restrictions Precautions Precautions: Fall Restrictions Weight Bearing Restrictions: No    Mobility  Bed Mobility Overal bed mobility: Needs Assistance Bed Mobility: Supine to Sit     Supine to sit: Max assist;+2 for safety/equipment     General bed mobility comments: pt began bringing bilat LE off EOB without cues and assisted minimally to roll toward L side of bed when trunk rotation faciliated by therapist   Transfers Overall transfer level: Needs assistance   Transfers: Lateral/Scoot Transfers          Lateral/Scoot Transfers: Max assist;+2 physical assistance General transfer comment: use of gait belt and bed pad to bring hips laterally to recliner from EOB; assistance required to maintain anterior translation of trunk throughout transfer  Ambulation/Gait                 Stairs             Wheelchair Mobility    Modified Rankin (Stroke Patients Only)       Balance Overall balance assessment: Needs assistance   Sitting balance-Leahy Scale: Poor Sitting balance - Comments: pt reliant on bilat UE and external assistance to maintain sitting balance EOB due to posterior bias                                    Cognition Arousal/Alertness: Awake/alert Behavior During Therapy: Flat affect Overall Cognitive Status: No family/caregiver present to determine baseline cognitive functioning                     Current Attention Level: Focused   Following Commands: Follows one step commands inconsistently   Awareness: Intellectual          Exercises      General Comments        Pertinent Vitals/Pain Faces Pain Scale: No hurt    Home Living     Available Help at Discharge: [place] Type of Home: [place]              Prior Function            PT Goals (current goals can now be found in the care plan section) Acute Rehab PT Goals PT Goal Formulation: Patient unable to participate in goal setting Time For Goal  Achievement: 05/08/18 Potential to Achieve Goals: Fair Progress towards PT goals: Progressing toward goals    Frequency    Min 2X/week      PT Plan Current plan remains appropriate    Co-evaluation              AM-PAC PT "6 Clicks" Daily Activity  Outcome Measure  Difficulty turning over in bed (including adjusting bedclothes, sheets and blankets)?: Unable Difficulty moving from lying on back to sitting on the side of the bed? : Unable Difficulty sitting down on and standing up from a chair with arms (e.g., wheelchair, bedside commode, etc,.)?: Unable Help needed moving to and from a bed to chair (including a wheelchair)?: A Lot Help needed walking in hospital room?: Total Help needed climbing 3-5 steps with a railing? : Total 6 Click  Score: 7    End of Session Equipment Utilized During Treatment: Gait belt Activity Tolerance: Patient tolerated treatment well Patient left: with call bell/phone within reach;in chair;with chair alarm set Nurse Communication: Mobility status;Need for lift equipment PT Visit Diagnosis: Other abnormalities of gait and mobility (R26.89);Other symptoms and signs involving the nervous system (R29.898)     Time: 4696-2952 PT Time Calculation (min) (ACUTE ONLY): 31 min  Charges:  $Therapeutic Activity: 23-37 mins                    G Codes:       Earney Navy, PTA Pager: [phone]     Darliss Cheney 04/26/2018, 2:30 PM
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
## 2018-04-26 NOTE — Consult Note (Signed)
Attempted to see patient but nurse reports that she suspects that he is having pain although he is unable to verbalize where. He is tearful. BP 194/101. Will attempt to interview patient again tomorrow.   Buford Dresser, DO 04/26/18 4:51 PM

## 2018-04-26 NOTE — Progress Notes (Signed)
  Speech Language Pathology Treatment: Dysphagia;Cognitive-Linquistic  Patient Details Name: Henry Smith MRN: [id number] DOB: 1941-01-25 Today's Date: 04/26/2018 Time: 3474-2595 SLP Time Calculation (min) (ACUTE ONLY): 15 min  Assessment / Plan / Recommendation Clinical Impression  Skilled treatment session focused on dysphagia goals. SLP received pt in bed with mouth open d/t right facial droop. Pt able to open eyes and maintain brief eye contact with SLP. However when SLP attempted to provide oral care, pt with tightly pursed lips. Despite Total A, pt didn't open lips. Question possible apraxia vs volitional closing of lips. However on several occasions, he shook head "no" when SLP provided cues for intake. Pt was able to imitate any oral movements or open mouth even with ice chips presented. Education provided to RN that pt many of pt's comorbidities could be preventing his oral acceptance of bolus but at this time, pt is likely to require an alternative means of nutrition. Nursing will speak with MD.    HPI HPI: Pt is a 77 y.o. male admitted 04/23/18 with AMS, including non-verbal, not walking and R-side facial droop. Worked up for encephalopathy secondary to sepsis due to UTI, AKI. Head CT and MRI show no acute intracranial finding; mild small vessel change normal for age. PMH includes dementia, schizophrenia, prostate CA      SLP Plan  Continue with current plan of care       Recommendations  Diet recommendations: NPO Medication Administration: Via alternative means                Oral Care Recommendations: Oral care QID Follow up Recommendations: Skilled Nursing facility SLP Visit Diagnosis: Dysphagia, oropharyngeal phase (R13.12);Cognitive communication deficit (R41.841) Plan: Continue with current plan of care       [place] 04/26/2018, 12:33 PM

## 2018-04-26 NOTE — Progress Notes (Signed)
[facility]                                                                              Patient Demographics  Henry Smith, is a 77 y.o. male, DOB - 1941/01/01, VXB:[id number]  Admit date - 04/23/2018   Admitting Physician Oswald Hillock, MD  Outpatient Primary MD for the patient is Caprice Renshaw, MD  Outpatient specialists:   LOS - 3  days   Medical records reviewed and are as summarized below:    Chief Complaint  Patient presents with  . Altered Mental Status       Brief summary   Henry Smith  is a 77 y.o. male, with history of schizophrenia, dementia associated with neurosyphilis, prostate cancer was brought to hospital with altered mental status.  Patient was initially  Nonverbal.  As per ED notes patient has been nonverbal drooping, right-sided facial droop not able to walk as per nursing home staff thought that has "schizophrenia might be acting up".  Patient was sent to ED for further evaluation.  Patient was found to have UTI, lactic acidosis, creatinine 1.5, admitted for further evaluation.  Overnight was noted to have motor aphasia with worsening right-sided hemiparesis, patient was transferred to [facility] for MRI brain to rule out CVA.  Assessment & Plan    Principal Problem:   Sepsis ([place]) secondary to UTI (urinary tract infection), Proteus bacteremia -Patient met sepsis criteria at the time of admission due to lactic acidosis, UTI, acute kidney injury, febrile, tachycardia, tachypnea -Continue IV Rocephin 2 g IV 24 hours, urine culture and blood cultures both positive for Proteus mirabilis urine cultures and blood cultures showing Proteus mirabilis -Unable to transition to oral antibiotics, n.p.o. status, SLP evaluation pending  Active Problems:   Schizoaffective disorder, bipolar type (HCC) -Continue Depakote, olanzapine  Dysphagia -Has history of dementia, schizophrenia, swallow evaluation done, recommended n.p.o. status due to high  risk of aspiration.  MBS to follow. -Continue D5 half-normal at 60 cc an hour  -Patient refusing oral care, working with SLP, has a history of dementia schizophrenia, will place psych consult    Acute metabolic encephalopathy -A little more alert and awake however refusing oral care, not working with speech therapy for swallow evaluation.   -Patient was transferred to [facility] for concern for stroke however MRI of the brain showed no acute stroke.      Lactic acidosis,  AKI (acute kidney injury) ([place]) - baseline creatinine 0.8.  Presented with creatinine of 1.58 with lactic acidosis 5.2 -Continue gentle hydration, n.p.o. status, creatinine improved to 1.0  Hypertension -BP elevated, n.p.o. status, placed on IV hydralazine scheduled 10 mg every 6 hours  Code Status; full  DVT Prophylaxis:  Lovenox  Family Communication: No family member at bedside   Disposition Plan:   Time Spent in minutes   35 minutes  Procedures:  MRI brain  Consultants:   Neurology Psychiatry  Antimicrobials:  IV Rocephin 5/12  Medications  Scheduled Meds: . amantadine  100 mg Oral BID  . gabapentin  300 mg Oral TID  . latanoprost  1 drop Both Eyes QHS   Continuous Infusions: .  cefTRIAXone (ROCEPHIN)  IV 2 g (04/25/18 1725)  . dextrose 5 % and 0.45% NaCl 60 mL/hr at 04/26/18 0427  . valproate sodium 500 mg (04/25/18 2248)   And  . valproate sodium 250 mg (04/26/18 1013)   PRN Meds:.acetaminophen **OR** acetaminophen (TYLENOL) oral liquid 160 mg/5 mL **OR** acetaminophen, acetaminophen **OR** acetaminophen   Antibiotics   Anti-infectives (From admission, onward)   Start     Dose/Rate Route Frequency Ordered Stop   04/24/18 1800  cefTRIAXone (ROCEPHIN) 2 g in sodium chloride 0.9 % 100 mL IVPB     2 g 200 mL/hr over 30 Minutes Intravenous Every 24 hours 04/24/18 1323     04/24/18 0400  aztreonam (AZACTAM) 1 g in sodium chloride 0.9 % 100 mL IVPB  Status:  Discontinued     1 g 200 mL/hr over 30  Minutes Intravenous Every 8 hours 04/23/18 1826 04/24/18 1323   04/23/18 1715  levofloxacin (LEVAQUIN) IVPB 750 mg     750 mg 100 mL/hr over 90 Minutes Intravenous  Once 04/23/18 1703 04/23/18 1909   04/23/18 1715  aztreonam (AZACTAM) 2 g in sodium chloride 0.9 % 100 mL IVPB     2 g 200 mL/hr over 30 Minutes Intravenous  Once 04/23/18 1703 04/23/18 1953        Subjective:   Roshon Duell was seen and examined today.  Alert and awake, nods to yes or no answer, flat affect.  Not cooperative with exam or verbal responses.  BP still elevated  Objective:   Vitals:   04/26/18 0011 04/26/18 0428 04/26/18 0833 04/26/18 1116  BP: (!) 163/85 (!) 165/81 (!) 189/90 (!) 189/79  Pulse: 60 66 (!) 52 (!) 55  Resp: '20 20 16 16  ' Temp: 99.1 F (37.3 C) 99.2 F (37.3 C) 97.7 F (36.5 C) 97.9 F (36.6 C)  TempSrc: Oral Oral Oral Oral  SpO2: 95% 95% 97% 99%  Weight:      Height:        Intake/Output Summary (Last 24 hours) at 04/26/2018 1134 Last data filed at 04/26/2018 0912 Gross per 24 hour  Intake -  Output 1650 ml  Net -1650 ml     Wt Readings from Last 3 Encounters:  04/24/18 98.6 kg (217 lb 6 oz)  12/24/17 89.4 kg (197 lb)  10/27/17 89.8 kg (197 lb 14.4 oz)     Exam   General: Alert and awake, flat affect, nods yes or no, no clear responses  Eyes: Tracks with his eyes  HEENT:    Cardiovascular: S1 S2 auscultated, Regular rate and rhythm. No pedal edema b/l  Respiratory: Decreased breath sound at the bases  Gastrointestinal: Soft, nontender, nondistended, + bowel sounds  Ext: no pedal edema bilaterally  Neuro: does not cooperate with exam however moving all 4 extremities spontaneously  Musculoskeletal: No digital cyanosis, clubbing  Skin: No rashes  Psych: flat affect   Data Reviewed:  I have personally reviewed following labs and imaging studies  Micro Results Recent Results (from the past 240 hour(s))  Urine culture     Status: Abnormal    Collection Time: 04/23/18  3:40 PM  Result Value Ref Range Status   Specimen Description   Final    URINE, CATHETERIZED Performed at [facility], [address], [place], [place] [address]    Special Requests   Final    NONE Performed at [facility], [address] [place],  [address]    Culture >=100,000 COLONIES/mL PROTEUS MIRABILIS (A)  Final   Report Status 04/25/2018 FINAL  Final   Organism ID, Bacteria PROTEUS MIRABILIS (A)  Final      Susceptibility   Proteus mirabilis - MIC*    AMPICILLIN <=2 SENSITIVE Sensitive     CEFAZOLIN <=4 SENSITIVE Sensitive     CEFTRIAXONE <=1 SENSITIVE Sensitive     CIPROFLOXACIN <=0.25 SENSITIVE Sensitive     GENTAMICIN <=1 SENSITIVE Sensitive     IMIPENEM 1 SENSITIVE Sensitive     NITROFURANTOIN 128 RESISTANT Resistant     TRIMETH/SULFA <=20 SENSITIVE Sensitive     AMPICILLIN/SULBACTAM <=2 SENSITIVE Sensitive     PIP/TAZO <=4 SENSITIVE Sensitive     * >=100,000 COLONIES/mL PROTEUS MIRABILIS  Culture, blood (routine x 2)     Status: Abnormal   Collection Time: 04/23/18  5:41 PM  Result Value Ref Range Status   Specimen Description   Final    RIGHT ANTECUBITAL Performed at [facility], [address], [place], [place] [address]    Special Requests   Final    BOTTLES DRAWN AEROBIC AND ANAEROBIC Blood Culture adequate volume Performed at [facility], [address], [place], [place] [address]    Culture  Setup Time   Final    GRAM NEGATIVE RODS IN BOTH AEROBIC AND ANAEROBIC BOTTLES Gram Stain Report Called to,Read Back By and Verified With: NESBITT,R'@0815'  BY MATTHEWS, B 5.12.19 Performed at [facility]    Culture (A)  Final    PROTEUS MIRABILIS SUSCEPTIBILITIES PERFORMED ON PREVIOUS CULTURE WITHIN THE LAST 5 DAYS. Performed at [facility], [place] [address], [place], [place] [address]    Report Status 04/26/2018 FINAL  Final  Culture, blood (routine x 2)     Status: Abnormal   Collection Time: 04/23/18  5:42 PM    Result Value Ref Range Status   Specimen Description   Final    BLOOD LEFT ARM Performed at [facility], [address] [place], [place] [address]    Special Requests   Final    BOTTLES DRAWN AEROBIC ONLY Blood Culture results may not be optimal due to an inadequate volume of blood received in culture bottles Performed at [place] [place],  [address]    Culture  Setup Time   Final    GRAM NEGATIVE RODS AEROBIC BOTTLE ONLY Gram Stain Report Called to,Read Back By and Verified With: NESBITT,R'@0917'  BY MATTHEWS, B 5.12.19 Performed at [place] (A)  Final   Report Status 04/26/2018 FINAL  Final   Organism ID, Bacteria PROTEUS MIRABILIS  Final      Susceptibility   Proteus mirabilis - MIC*    AMPICILLIN <=2 SENSITIVE Sensitive     CEFAZOLIN <=4 SENSITIVE Sensitive     CEFEPIME <=1 SENSITIVE Sensitive     CEFTAZIDIME <=1 SENSITIVE Sensitive     CEFTRIAXONE <=1 SENSITIVE Sensitive     CIPROFLOXACIN <=0.25 SENSITIVE Sensitive     GENTAMICIN <=1 SENSITIVE Sensitive     IMIPENEM 1 SENSITIVE Sensitive     TRIMETH/SULFA <=20 SENSITIVE Sensitive     AMPICILLIN/SULBACTAM <=2 SENSITIVE Sensitive     PIP/TAZO <=4 SENSITIVE Sensitive     * PROTEUS MIRABILIS  Blood Culture ID Panel (Reflexed)     Status: Abnormal   Collection Time: 04/23/18  5:42 PM  Result Value Ref Range Status   Enterococcus species NOT DETECTED NOT DETECTED Final   Listeria monocytogenes NOT DETECTED NOT DETECTED Final   Staphylococcus species  NOT DETECTED NOT DETECTED Final   Staphylococcus aureus NOT DETECTED NOT DETECTED Final   Streptococcus species NOT DETECTED NOT DETECTED Final   Streptococcus agalactiae NOT DETECTED NOT DETECTED Final   Streptococcus pneumoniae NOT DETECTED NOT DETECTED Final   Streptococcus pyogenes NOT DETECTED NOT DETECTED Final   Acinetobacter baumannii NOT DETECTED NOT DETECTED Final   Enterobacteriaceae species DETECTED  (A) NOT DETECTED Final    Comment: Enterobacteriaceae represent a large family of gram-negative bacteria, not a single organism. CRITICAL RESULT CALLED TO, READ BACK BY AND VERIFIED WITH: NITA JOHNSTON PHRAMD AT [facility] AT [id number] ON [id number] BY SJW    Enterobacter cloacae complex NOT DETECTED NOT DETECTED Final   Escherichia coli NOT DETECTED NOT DETECTED Final   Klebsiella oxytoca NOT DETECTED NOT DETECTED Final   Klebsiella pneumoniae NOT DETECTED NOT DETECTED Final   Proteus species DETECTED (A) NOT DETECTED Final    Comment: CRITICAL RESULT CALLED TO, READ BACK BY AND VERIFIED WITH: NICK HAYES PHARMD AT 1245 ON [id number] BY SJW    Serratia marcescens NOT DETECTED NOT DETECTED Final   Carbapenem resistance NOT DETECTED NOT DETECTED Final   Haemophilus influenzae NOT DETECTED NOT DETECTED Final   Neisseria meningitidis NOT DETECTED NOT DETECTED Final   Pseudomonas aeruginosa NOT DETECTED NOT DETECTED Final   Candida albicans NOT DETECTED NOT DETECTED Final   Candida glabrata NOT DETECTED NOT DETECTED Final   Candida krusei NOT DETECTED NOT DETECTED Final   Candida parapsilosis NOT DETECTED NOT DETECTED Final   Candida tropicalis NOT DETECTED NOT DETECTED Final    Comment: Performed at [facility], [address] [place], [place] [address]  MRSA PCR Screening     Status: None   Collection Time: 04/23/18  8:06 PM  Result Value Ref Range Status   MRSA by PCR NEGATIVE NEGATIVE Final    Comment:        The GeneXpert MRSA Assay (FDA approved for NASAL specimens only), is one component of a comprehensive MRSA colonization surveillance program. It is not intended to diagnose MRSA infection nor to guide or monitor treatment for MRSA infections. Performed at [facility], [address], [place], [place] [address]   Culture, blood (routine x 2)     Status: None (Preliminary result)   Collection Time: 04/25/18  7:46 AM  Result Value Ref Range Status   Specimen Description BLOOD RIGHT  HAND  Final   Special Requests   Final    BOTTLES DRAWN AEROBIC AND ANAEROBIC Blood Culture adequate volume   Culture   Final    NO GROWTH 1 DAY Performed at [place] [address] [place], [place] [address]    Report Status PENDING  Incomplete  Culture, blood (routine x 2)     Status: None (Preliminary result)   Collection Time: 04/25/18  8:00 AM  Result Value Ref Range Status   Specimen Description BLOOD RIGHT ARM  Final   Special Requests   Final    BOTTLES DRAWN AEROBIC AND ANAEROBIC Blood Culture adequate volume   Culture   Final    NO GROWTH 1 DAY Performed at [facility], [place] [address], [place],  [address]    Report Status PENDING  Incomplete    Radiology Reports Ct Angio Head W Or Wo Contrast  Result Date: 04/24/2018 CLINICAL DATA:  Altered mental status. Weakness and facial droop for 1 week. Urosepsis. History of schizophrenia, dementia with neurosyphilis, prostate cancer. EXAM: CT ANGIOGRAPHY HEAD AND  NECK TECHNIQUE: Multidetector CT imaging of the head and neck was performed using the standard protocol during bolus administration of intravenous contrast. Multiplanar CT image reconstructions and MIPs were obtained to evaluate the vascular anatomy. Carotid stenosis measurements (when applicable) are obtained utilizing NASCET criteria, using the distal internal carotid diameter as the denominator. CONTRAST:  68m ISOVUE-370 IOPAMIDOL (ISOVUE-370) INJECTION 76% COMPARISON:  CT angiogram head and neck January 28, 2017 FINDINGS: CTA NECK FINDINGS: AORTIC ARCH: Normal appearance of the thoracic arch, normal branch pattern. Mild calcific atherosclerosis aortic arch. The origins of the innominate, left Common carotid artery and subclavian artery are widely patent. RIGHT CAROTID SYSTEM: Common carotid artery is patent. Normal appearance of the carotid bifurcation without hemodynamically significant stenosis by NASCET criteria. Normal appearance of the internal  carotid artery, developmentally smaller. LEFT CAROTID SYSTEM: Common carotid artery is patent. Normal appearance of the carotid bifurcation without hemodynamically significant stenosis by NASCET criteria. Normal appearance of the internal carotid artery. VERTEBRAL ARTERIES:Proximal LEFT vertebral artery obscured by streak artifact from venous contamination. Symmetric patent vertebral artery's without luminal irregularity. SKELETON: No acute osseous process though bone windows have not been submitted. Severe degenerative change of the cervical spine. OTHER NECK: Soft tissues of the neck are nonacute though, not tailored for evaluation. UPPER CHEST: Minimal debris RIGHT mainstem bronchus. CTA HEAD FINDINGS: ANTERIOR CIRCULATION: Patent cervical internal carotid arteries, petrous, cavernous and supra clinoid internal carotid arteries. Patent anterior communicating artery. Patent anterior and middle cerebral arteries, mild luminal irregularity compatible with atherosclerosis. No large vessel occlusion, significant stenosis, contrast extravasation or aneurysm. POSTERIOR CIRCULATION: Patent vertebral arteries, vertebrobasilar junction and basilar artery, as well as main branch vessels. Patent posterior cerebral arteries, mild luminal irregularity compatible with atherosclerosis. Small LEFT posterior communicating artery present. No large vessel occlusion, significant stenosis, contrast extravasation or aneurysm. VENOUS SINUSES: Major dural venous sinuses are patent though not tailored for evaluation on this angiographic examination. ANATOMIC VARIANTS: Hypoplastic RIGHT A1 segment. DELAYED PHASE: No abnormal intracranial enhancement. MIP images reviewed. IMPRESSION: CTA NECK: 1. No hemodynamically significant stenosis or acute vascular process in the neck. 2. Minimal debris RIGHT mainstem bronchus concerning for aspiration. CTA HEAD: 1. No emergent large vessel occlusion or flow limiting stenosis. 2. Mild intracranial  atherosclerosis. Aortic Atherosclerosis (ICD10-I70.0). Electronically Signed   By: CElon AlasM.D.   On: 04/24/2018 02:27   Dg Chest 1 View  Result Date: 04/23/2018 CLINICAL DATA:  Weakness for 1 week. EXAM: CHEST  1 VIEW COMPARISON:  10/29/2017 and prior chest radiographs FINDINGS: This is a low volume film. Cardiomediastinal silhouette is unchanged with fullness of the SUPERIOR mediastinum again noted since 2017. Mild LEFT basilar scarring again identified. There is no evidence of focal airspace disease, pulmonary edema, suspicious pulmonary nodule/mass, pleural effusion, or pneumothorax. No acute bony abnormalities are identified. IMPRESSION: No evidence of acute cardiopulmonary disease. Electronically Signed   By: JMargarette CanadaM.D.   On: 04/23/2018 15:44   Ct Head Wo Contrast  Result Date: 04/23/2018 CLINICAL DATA:  77year old male with altered mental status for 1 week. Weakness and facial droop. EXAM: CT HEAD WITHOUT CONTRAST TECHNIQUE: Contiguous axial images were obtained from the base of the skull through the vertex without intravenous contrast. COMPARISON:  10/27/2017 and prior CTs FINDINGS: Brain: No evidence of acute infarction, hemorrhage, hydrocephalus, extra-axial collection or mass lesion/mass effect. Mild chronic small-vessel white matter ischemic changes are again identified. Vascular: No hyperdense vessel or unexpected calcification. Skull: No acute abnormality. Small sclerotic foci within the visualized calvarium are  unchanged from 2017. Sinuses/Orbits: No acute finding. Other: None. IMPRESSION: 1. No evidence of acute intracranial abnormality 2. Mild chronic small-vessel white matter ischemic changes. Electronically Signed   By: Margarette Canada M.D.   On: 04/23/2018 16:02   Ct Angio Neck W Or Wo Contrast  Result Date: 04/24/2018 CLINICAL DATA:  Altered mental status. Weakness and facial droop for 1 week. Urosepsis. History of schizophrenia, dementia with neurosyphilis, prostate  cancer. EXAM: CT ANGIOGRAPHY HEAD AND NECK TECHNIQUE: Multidetector CT imaging of the head and neck was performed using the standard protocol during bolus administration of intravenous contrast. Multiplanar CT image reconstructions and MIPs were obtained to evaluate the vascular anatomy. Carotid stenosis measurements (when applicable) are obtained utilizing NASCET criteria, using the distal internal carotid diameter as the denominator. CONTRAST:  30m ISOVUE-370 IOPAMIDOL (ISOVUE-370) INJECTION 76% COMPARISON:  CT angiogram head and neck January 28, 2017 FINDINGS: CTA NECK FINDINGS: AORTIC ARCH: Normal appearance of the thoracic arch, normal branch pattern. Mild calcific atherosclerosis aortic arch. The origins of the innominate, left Common carotid artery and subclavian artery are widely patent. RIGHT CAROTID SYSTEM: Common carotid artery is patent. Normal appearance of the carotid bifurcation without hemodynamically significant stenosis by NASCET criteria. Normal appearance of the internal carotid artery, developmentally smaller. LEFT CAROTID SYSTEM: Common carotid artery is patent. Normal appearance of the carotid bifurcation without hemodynamically significant stenosis by NASCET criteria. Normal appearance of the internal carotid artery. VERTEBRAL ARTERIES:Proximal LEFT vertebral artery obscured by streak artifact from venous contamination. Symmetric patent vertebral artery's without luminal irregularity. SKELETON: No acute osseous process though bone windows have not been submitted. Severe degenerative change of the cervical spine. OTHER NECK: Soft tissues of the neck are nonacute though, not tailored for evaluation. UPPER CHEST: Minimal debris RIGHT mainstem bronchus. CTA HEAD FINDINGS: ANTERIOR CIRCULATION: Patent cervical internal carotid arteries, petrous, cavernous and supra clinoid internal carotid arteries. Patent anterior communicating artery. Patent anterior and middle cerebral arteries, mild luminal  irregularity compatible with atherosclerosis. No large vessel occlusion, significant stenosis, contrast extravasation or aneurysm. POSTERIOR CIRCULATION: Patent vertebral arteries, vertebrobasilar junction and basilar artery, as well as main branch vessels. Patent posterior cerebral arteries, mild luminal irregularity compatible with atherosclerosis. Small LEFT posterior communicating artery present. No large vessel occlusion, significant stenosis, contrast extravasation or aneurysm. VENOUS SINUSES: Major dural venous sinuses are patent though not tailored for evaluation on this angiographic examination. ANATOMIC VARIANTS: Hypoplastic RIGHT A1 segment. DELAYED PHASE: No abnormal intracranial enhancement. MIP images reviewed. IMPRESSION: CTA NECK: 1. No hemodynamically significant stenosis or acute vascular process in the neck. 2. Minimal debris RIGHT mainstem bronchus concerning for aspiration. CTA HEAD: 1. No emergent large vessel occlusion or flow limiting stenosis. 2. Mild intracranial atherosclerosis. Aortic Atherosclerosis (ICD10-I70.0). Electronically Signed   By: CElon AlasM.D.   On: 04/24/2018 02:27   Mr Brain Wo Contrast  Result Date: 04/24/2018 CLINICAL DATA:  Motor aphasia.  Right-sided hemiparesis. EXAM: MRI HEAD WITHOUT CONTRAST MRA HEAD WITHOUT CONTRAST TECHNIQUE: Multiplanar, multiecho pulse sequences of the brain and surrounding structures were obtained without intravenous contrast. Angiographic images of the head were obtained using MRA technique without contrast. COMPARISON:  CT same day.  MRI 05/06/2017. FINDINGS: MRI HEAD FINDINGS Brain: Diffusion imaging does not show any acute or subacute infarction. The brainstem and cerebellum are normal. Cerebral hemispheres show generalized atrophy with a few old small vessel insults within the deep white matter. No cortical or large vessel territory infarction. No mass lesion, hemorrhage, hydrocephalus or extra-axial collection. Vascular: Major  vessels  at the base of the brain show flow. Skull and upper cervical spine: Negative Sinuses/Orbits: Clear/normal Other: None MRA HEAD FINDINGS Both internal carotid arteries are widely patent through the siphon region. Mild atherosclerotic irregularity but no flow limiting stenosis. Right internal carotid artery supplies only the right middle cerebral artery territory. Left ICA supplies the left MCA and both anterior cerebral artery territories. Distal branch vessels show some atherosclerotic irregularity. Both vertebral arteries widely patent to the basilar. No basilar stenosis. Posterior circulation branch vessels appear normal, except for mild atherosclerotic irregularity of the more distal PCA branches. IMPRESSION: No acute intracranial finding. Normal for age, with mild small vessel change of the cerebral hemispheric deep white matter. MR angiography does not show any large or medium vessel occlusion or correctable proximal stenosis. 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 Mild atherosclerotic irregularity of the more distal branch vessels. Electronically Signed   By: Nelson Chimes M.D.   On: 04/24/2018 09:12   Mr Jodene Nam Head/brain IO Cm  Result Date: 04/24/2018 CLINICAL DATA:  Motor aphasia.  Right-sided hemiparesis. EXAM: MRI HEAD WITHOUT CONTRAST MRA HEAD WITHOUT CONTRAST TECHNIQUE: Multiplanar, multiecho pulse sequences of the brain and surrounding structures were obtained without intravenous contrast. Angiographic images of the head were obtained using MRA technique without contrast. COMPARISON:  CT same day.  MRI 05/06/2017. FINDINGS: MRI HEAD FINDINGS Brain: Diffusion imaging does not show any acute or subacute infarction. The brainstem and cerebellum are normal. Cerebral hemispheres show generalized atrophy with a few old small vessel insults within the deep white matter. No cortical or  large vessel territory infarction. No mass lesion, hemorrhage, hydrocephalus or extra-axial collection. Vascular: Major vessels at the base of the brain show flow. Skull and upper cervical spine: Negative Sinuses/Orbits: Clear/normal Other: None MRA HEAD FINDINGS Both internal carotid arteries are widely patent through the siphon region. Mild atherosclerotic irregularity but no flow limiting stenosis. Right internal carotid artery supplies only the right middle cerebral artery territory. Left ICA supplies the left MCA and both anterior cerebral artery territories. Distal branch vessels show some atherosclerotic irregularity. Both vertebral arteries widely patent to the basilar. No basilar stenosis. Posterior circulation branch vessels appear normal, except for mild atherosclerotic irregularity of the more distal PCA branches. IMPRESSION: No acute intracranial finding. Normal for age, with mild small vessel change of the cerebral hemispheric deep white matter. MR angiography does not show any large or medium vessel occlusion or correctable proximal stenosis. 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 Mild atherosclerotic irregularity of the more distal branch vessels. Electronically Signed   By: Nelson Chimes M.D.   On: 04/24/2018 09:12    Lab Data:  CBC: Recent Labs  Lab 04/23/18 1554 04/24/18 0436  WBC 15.6* 11.4*  NEUTROABS 14.0  --   HGB 15.9 14.0  HCT 48.1 44.7  MCV 85.4 88.2  PLT 82* 77*   Basic Metabolic Panel: Recent Labs  Lab 04/23/18 1554 04/24/18 0127 04/24/18 0436 04/25/18 0440  NA 137 140 139 144  K 5.2* 4.5 4.8 4.1  CL 100* 108 107 113*  CO2 '26 26 26 25  ' GLUCOSE 110* 95 92 104*  BUN 21* 25* 26* 25*  CREATININE 1.58* 1.45* 1.34* 1.06  CALCIUM 11.8* 10.8* 10.9* 11.4*   GFR: Estimated Creatinine Clearance: 73.2 mL/min (by C-G formula based on SCr of  1.06  mg/dL). Liver Function Tests: Recent Labs  Lab 04/23/18 1554 04/24/18 0436  AST 36 29  ALT 36 31  ALKPHOS 45 40  BILITOT 0.8 0.8  PROT 6.8 5.9*  ALBUMIN 3.2* 2.6*   No results for input(s): LIPASE, AMYLASE in the last 168 hours. No results for input(s): AMMONIA in the last 168 hours. Coagulation Profile: Recent Labs  Lab 04/23/18 1554  INR 1.23   Cardiac Enzymes: Recent Labs  Lab 04/23/18 1554  CKTOTAL 48  TROPONINI <0.03   BNP (last 3 results) No results for input(s): PROBNP in the last 8760 hours. HbA1C: Recent Labs    04/24/18 0436  HGBA1C 5.3   CBG: Recent Labs  Lab 04/25/18 1956 04/25/18 2355 04/26/18 0430 04/26/18 0830 04/26/18 1113  GLUCAP 100* 105* 96 90 85   Lipid Profile: Recent Labs    04/24/18 0436  CHOL 85  HDL 43  LDLCALC 32  TRIG 49  CHOLHDL 2.0   Thyroid Function Tests: No results for input(s): TSH, T4TOTAL, FREET4, T3FREE, THYROIDAB in the last 72 hours. Anemia Panel: No results for input(s): VITAMINB12, FOLATE, FERRITIN, TIBC, IRON, RETICCTPCT in the last 72 hours. Urine analysis:    Component Value Date/Time   COLORURINE YELLOW 04/23/2018 1540   APPEARANCEUR TURBID (A) 04/23/2018 1540   LABSPEC 1.021 04/23/2018 1540   PHURINE 5.0 04/23/2018 1540   GLUCOSEU 50 (A) 04/23/2018 1540   HGBUR LARGE (A) 04/23/2018 1540   BILIRUBINUR NEGATIVE 04/23/2018 1540   KETONESUR NEGATIVE 04/23/2018 1540   PROTEINUR 100 (A) 04/23/2018 1540   NITRITE NEGATIVE 04/23/2018 1540   LEUKOCYTESUR MODERATE (A) 04/23/2018 1540     Ripudeep Rai M.D. [facility] 04/26/2018, 11:34 AM  Pager: [phone] Between 7am to 7pm - call Pager - [phone]  After 7pm go to [email or web address] - password [email or web address]  Call night coverage person covering after 7pm

## 2018-04-26 NOTE — Progress Notes (Signed)
Called dietary to inquire about cortrak placement. Cortrak placement does not happen on Tuesday or Thursday. If there is an order in, they will get to it Wednesday

## 2018-04-26 NOTE — Care Management Note (Signed)
Case Management Note  Patient Details  Name: Henry Smith MRN: [id number] Date of Birth: [birth year]  Subjective/Objective:      Pt admitted with sepsis. He is from [facility] in [place].               Action/Plan: Pt not participating in swallowing testing. Psychiatry to assess. Plan is for patient to return to [facility] when medically stable.   Expected Discharge Date:                  Expected Discharge Plan:  Skilled Nursing Facility  In-House Referral:  Clinical Social Work  Discharge planning Services     Post Acute Care Choice:    Choice offered to:     DME Arranged:    DME Agency:     HH Arranged:    [place] Agency:     Status of Service:  In process, will continue to follow  If discussed at Long Length of Stay Meetings, dates discussed:    Additional Comments:  Pollie Friar, RN 04/26/2018, 12:05 PM

## 2018-04-26 NOTE — NC FL2 (Signed)
[place] LEVEL OF CARE SCREENING TOOL     IDENTIFICATION  Patient Name: Henry Smith Birthdate: March 23, 1941 Sex: male Admission Date (Current Location): 04/23/2018  [facility] and [place] Number:  [employer] and Address:         Provider Number: [id number]  Attending Physician Name and Address:  Mendel Corning, MD  Relative Name and Phone Number:  Roswell Nickel, legal guardian, [phone]    Current Level of Care: Hospital Recommended Level of Care: [place] Prior Approval Number:    Date Approved/Denied:   PASRR Number: [id number]  Discharge Plan: SNF    Current Diagnoses: Patient Active Problem List   Diagnosis Date Noted  . UTI (urinary tract infection) 04/24/2018  . Acute metabolic encephalopathy 64/15/8309  . Lactic acidosis 04/24/2018  . AKI (acute kidney injury) ([place]) 04/24/2018  . Sepsis ([place]) 04/23/2018  . Tobacco use disorder 01/02/2018  . Cognitive impairment 12/29/2017  . Schizoaffective disorder, bipolar type ([place]) 12/29/2017  . Gait abnormality 05/06/2017  . Allergic drug rash 03/03/2017  . Altered mental status 02/26/2017  . Syphilis 01/31/2017  . Disorientation   . Generalized weakness 01/30/2017  . Altered mental status, unspecified 01/28/2017  . Hypercalcemia 11/01/2016  . Prostate cancer ([place]) 10/30/2016  . Leukopenia 10/30/2016  . Thrombocytopenia ([place]) 10/30/2016    Orientation RESPIRATION BLADDER Height & Weight     Self  Normal Incontinent Weight: 98.6 kg (217 lb 6 oz) Height:  5\' 11"  (180.3 cm)  BEHAVIORAL SYMPTOMS/MOOD NEUROLOGICAL BOWEL NUTRITION STATUS      Continent Diet(Please see DC summary)  AMBULATORY STATUS COMMUNICATION OF NEEDS Skin   Extensive Assist Verbally Normal                       Personal Care Assistance Level of Assistance  Bathing, Feeding, Dressing Bathing Assistance: Maximum assistance Feeding assistance: Limited assistance Dressing Assistance:  Maximum assistance     Functional Limitations Info  Sight, Hearing, Speech Sight Info: Adequate Hearing Info: Adequate Speech Info: Adequate    SPECIAL CARE FACTORS FREQUENCY  PT (By licensed PT), OT (By licensed OT), Speech therapy     PT Frequency: 3x/week OT Frequency: 2x/week     Speech Therapy Frequency: 2x/week      Contractures      Additional Factors Info  Code Status, Allergies, Isolation Precautions Code Status Info: Full Allergies Info: Penicillins     Isolation Precautions Info: Negative for MRSA by pcr      Current Medications (04/26/2018):  This is the current hospital active medication list Current Facility-Administered Medications  Medication Dose Route Frequency Provider Last Rate Last Dose  . acetaminophen (TYLENOL) tablet 650 mg  650 mg Oral Q4H PRN Reubin Milan, MD       Or  . acetaminophen (TYLENOL) solution 650 mg  650 mg Per Tube Q4H PRN Reubin Milan, MD       Or  . acetaminophen (TYLENOL) suppository 650 mg  650 mg Rectal Q4H PRN Reubin Milan, MD   650 mg at 04/25/18 0230  . acetaminophen (TYLENOL) tablet 650 mg  650 mg Oral Q6H PRN Reubin Milan, MD       Or  . acetaminophen (TYLENOL) suppository 650 mg  650 mg Rectal Q6H PRN Reubin Milan, MD   650 mg at 04/26/18 0438  . amantadine (SYMMETREL) capsule 100 mg  100 mg Oral BID Reubin Milan, MD   100 mg at 04/25/18 1413  .  cefTRIAXone (ROCEPHIN) 2 g in sodium chloride 0.9 % 100 mL IVPB  2 g Intravenous Q24H Rai, Ripudeep K, MD 200 mL/hr at 04/25/18 1725 2 g at 04/25/18 1725  . dextrose 5 %-0.45 % sodium chloride infusion   Intravenous Continuous Rai, Ripudeep K, MD 60 mL/hr at 04/26/18 0427    . gabapentin (NEURONTIN) capsule 300 mg  300 mg Oral TID Reubin Milan, MD   300 mg at 04/25/18 1226  . hydrALAZINE (APRESOLINE) injection 10 mg  10 mg Intravenous Q6H Rai, Ripudeep K, MD   10 mg at 04/26/18 1152  . latanoprost (XALATAN) 0.005 % ophthalmic  solution 1 drop  1 drop Both Eyes QHS Reubin Milan, MD   1 drop at 04/25/18 2104  . valproate (DEPACON) 500 mg in dextrose 5 % 50 mL IVPB  500 mg Intravenous Q24H Reubin Milan, MD 55 mL/hr at 04/25/18 2248 500 mg at 04/25/18 2248   And  . valproate (DEPACON) 250 mg in dextrose 5 % 50 mL IVPB  250 mg Intravenous 3 times per day Reubin Milan, MD 52.5 mL/hr at 04/26/18 1013 250 mg at 04/26/18 1013     Discharge Medications: Please see discharge summary for a list of discharge medications.  Relevant Imaging Results:  Relevant Lab Results:   Additional Information SSN: [id number]  Benard Halsted, LCSWA

## 2018-04-27 DIAGNOSIS — A498 Other bacterial infections of unspecified site: Secondary | ICD-10-CM

## 2018-04-27 DIAGNOSIS — R7881 Bacteremia: Secondary | ICD-10-CM

## 2018-04-27 DIAGNOSIS — Z8659 Personal history of other mental and behavioral disorders: Secondary | ICD-10-CM

## 2018-04-27 DIAGNOSIS — Z008 Encounter for other general examination: Secondary | ICD-10-CM

## 2018-04-27 DIAGNOSIS — N390 Urinary tract infection, site not specified: Secondary | ICD-10-CM

## 2018-04-27 DIAGNOSIS — A4150 Gram-negative sepsis, unspecified: Secondary | ICD-10-CM

## 2018-04-27 DIAGNOSIS — R479 Unspecified speech disturbances: Secondary | ICD-10-CM

## 2018-04-27 DIAGNOSIS — F17210 Nicotine dependence, cigarettes, uncomplicated: Secondary | ICD-10-CM

## 2018-04-27 DIAGNOSIS — F419 Anxiety disorder, unspecified: Secondary | ICD-10-CM

## 2018-04-27 DIAGNOSIS — F1099 Alcohol use, unspecified with unspecified alcohol-induced disorder: Secondary | ICD-10-CM

## 2018-04-27 DIAGNOSIS — F1290 Cannabis use, unspecified, uncomplicated: Secondary | ICD-10-CM

## 2018-04-27 LAB — GLUCOSE, CAPILLARY
GLUCOSE-CAPILLARY: 82 mg/dL (ref 65–99)
GLUCOSE-CAPILLARY: 107 mg/dL — AB (ref 65–99)
GLUCOSE-CAPILLARY: 119 mg/dL — AB (ref 65–99)
Glucose-Capillary: 89 mg/dL (ref 65–99)
Glucose-Capillary: 101 mg/dL — ABNORMAL HIGH (ref 65–99)

## 2018-04-27 LAB — BASIC METABOLIC PANEL
Anion gap: 4 — ABNORMAL LOW (ref 5–15)
BUN: 17 mg/dL (ref 6–20)
CHLORIDE: 117 mmol/L — AB (ref 101–111)
CO2: 26 mmol/L (ref 22–32)
CREATININE: 0.85 mg/dL (ref 0.61–1.24)
Calcium: 12.4 mg/dL — ABNORMAL HIGH (ref 8.9–10.3)
GFR calc non Af Amer: >60 mL/min (ref >60)
Glucose, Bld: 100 mg/dL — ABNORMAL HIGH (ref 65–99)
Potassium: 3.8 mmol/L (ref 3.5–5.1)
Sodium: 147 mmol/L — ABNORMAL HIGH (ref 135–145)

## 2018-04-27 LAB — CBC
HCT: 44.8 % (ref 39.0–52.0)
HEMOGLOBIN: 14.5 g/dL (ref 13.0–17.0)
MCH: 27.6 pg (ref 26.0–34.0)
MCHC: 32.4 g/dL (ref 30.0–36.0)
MCV: 85.3 fL (ref 78.0–100.0)
PLATELETS: 83 10*3/uL — AB (ref 150–400)
RBC: 5.25 MIL/uL (ref 4.22–5.81)
RDW: 15.3 % (ref 11.5–15.5)
WBC: 8.2 10*3/uL (ref 4.0–10.5)

## 2018-04-27 MED ORDER — LORAZEPAM 2 MG/ML IJ SOLN
2.0000 mg | Freq: Once | INTRAMUSCULAR | Status: AC
  Administered 2018-04-27: 2 mg via INTRAVENOUS
  Filled 2018-04-27: qty 1

## 2018-04-27 MED ORDER — ENSURE ENLIVE PO LIQD
237.0000 mL | Freq: Two times a day (BID) | ORAL | Status: DC
  Administered 2018-04-28 – 2018-05-04 (×10): 237 mL via ORAL

## 2018-04-27 MED ORDER — ADULT MULTIVITAMIN W/MINERALS CH
1.0000 | ORAL_TABLET | Freq: Every day | ORAL | Status: DC
  Administered 2018-04-28 – 2018-05-01 (×3): 1 via ORAL
  Filled 2018-04-27 (×6): qty 1

## 2018-04-27 NOTE — Progress Notes (Signed)
Attempted to administer meds as recommended by SLP. While attempting to give PO meds, patient holding liquids in mouth, not using straw. Unable to give PO meds

## 2018-04-27 NOTE — Progress Notes (Addendum)
PROGRESS NOTE    Henry Smith  CNO:[id number] DOB: 12/08/1941 DOA: 04/23/2018 PCP: Caprice Renshaw, MD   Brief Narrative: Henry Smith is a 77 y.o. male with a history of schizophrenia, dementia associate with neurosyphilis, prostate cancer, hypertension.  Patient presented secondary to worsening mental status, including becoming nonverbal with facial droop.  Initial concern for stroke, however, MRI was negative.  He was found to have a UTI with associated bacteremia and is currently on antibiotic therapy and the coronary.   Assessment & Plan:   Principal Problem:   Sepsis ([place]) Active Problems:   Schizoaffective disorder, bipolar type ([place])   UTI (urinary tract infection)   Acute metabolic encephalopathy   Lactic acidosis   AKI (acute kidney injury) ([place])   Sepsis Bacteremia/UTI secondary to Proteus Mirabilis Sensitive to ceftriaxone. -Continue ceftriaxone IV secondary to n.p.o. status  Acute metabolic encephalopathy In setting of acute infection in setting of underlying dementia secondary to neurosyphilis.  Patient is far from baseline.  MRI was negative for acute finding that could explain his mental status change. -Continue treatment as mentioned above for infection  Schizoaffective disorder Psychiatry consulted for evaluation. -Continue Depakote  Dysphasia Unknown etiology.  Possibly related to underlying dementia and schizophrenia.  Speech therapy recommended dysphagia 3 diet today, however, patient with some difficulty this afternoon with eating. -Attempt to feed orally today and if failing, have speech therapy to reevaluate and possible Cortrak placement today -Continue IV fluids  Acute kidney injury Resolved.  Essential hypertension Uncontrolled.  Not on oral medication secondary to n.p.o. status -Continue hydralazine 10 mg every 6 hours IV  Thrombocytopenia Chronic. Stable.   DVT prophylaxis: Lovenox Code Status:   Code Status: Full Code Family  Communication: None at bedside. Called guardian with no answer Disposition Plan: Discharge to SNF when medically stable   Consultants:   Neurology  Procedures:   None  Antimicrobials:  Levaquin (5/11)  Aztreonam (5/11>>5/12)  Ceftriaxone (5/12>>   Subjective: Afebrile overnight.   Objective: Vitals:   04/27/18 0007 04/27/18 0617 04/27/18 0828 04/27/18 1125  BP: (!) 155/66 (!) 164/75 (!) 149/73 (!) 174/95  Pulse: (!) 50 67 83 89  Resp: 18 18 16 20   Temp: 97.7 F (36.5 C) 98.7 F (37.1 C) (!) 97.5 F (36.4 C) 98.2 F (36.8 C)  TempSrc: Oral Oral Oral Axillary  SpO2: 98% 98% 97% 97%  Weight:      Height:        Intake/Output Summary (Last 24 hours) at 04/27/2018 1249 Last data filed at 04/27/2018 0856 Gross per 24 hour  Intake 120 ml  Output 900 ml  Net -780 ml   Filed Weights   04/23/18 1429 04/23/18 2025 04/24/18 0548  Weight: 90.7 kg (200 lb) 98.2 kg (216 lb 7.9 oz) 98.6 kg (217 lb 6 oz)    Examination:  General exam: Appears calm and comfortable Respiratory system: Clear to auscultation. Respiratory effort normal. Cardiovascular system: S1 & S2 heard, RRR. No murmurs, rubs, gallops or clicks. Gastrointestinal system: Abdomen is nondistended, soft and nontender. No organomegaly or masses felt. Normal bowel sounds heard. Central nervous system: Alert, facial droop. Moves legs spontaneously. Not following commands. Verbalizes unintelligibly when asked questions. Extremities: No edema. No calf tenderness Skin: No cyanosis. No rashes Psychiatry: Flat affect    Data Reviewed: I have personally reviewed following labs and imaging studies  CBC: Recent Labs  Lab 04/23/18 1554 04/24/18 0436 04/27/18 0808  WBC 15.6* 11.4* 8.2  NEUTROABS 14.0  --   --  HGB 15.9 14.0 14.5  HCT 48.1 44.7 44.8  MCV 85.4 88.2 85.3  PLT 82* 77* 83*   Basic Metabolic Panel: Recent Labs  Lab 04/23/18 1554 04/24/18 0127 04/24/18 0436 04/25/18 0440 04/27/18 0808  NA  137 140 139 144 147*  K 5.2* 4.5 4.8 4.1 3.8  CL 100* 108 107 113* 117*  CO2 26 26 26 25 26   GLUCOSE 110* 95 92 104* 100*  BUN 21* 25* 26* 25* 17  CREATININE 1.58* 1.45* 1.34* 1.06 0.85  CALCIUM 11.8* 10.8* 10.9* 11.4* 12.4*   GFR: Estimated Creatinine Clearance: 91.2 mL/min (by C-G formula based on SCr of 0.85 mg/dL). Liver Function Tests: Recent Labs  Lab 04/23/18 1554 04/24/18 0436  AST 36 29  ALT 36 31  ALKPHOS 45 40  BILITOT 0.8 0.8  PROT 6.8 5.9*  ALBUMIN 3.2* 2.6*   No results for input(s): LIPASE, AMYLASE in the last 168 hours. No results for input(s): AMMONIA in the last 168 hours. Coagulation Profile: Recent Labs  Lab 04/23/18 1554  INR 1.23   Cardiac Enzymes: Recent Labs  Lab 04/23/18 1554  CKTOTAL 66  TROPONINI <0.03   BNP (last 3 results) No results for input(s): PROBNP in the last 8760 hours. HbA1C: No results for input(s): HGBA1C in the last 72 hours. CBG: Recent Labs  Lab 04/26/18 1937 04/26/18 2354 04/27/18 0409 04/27/18 0824 04/27/18 1123  GLUCAP 89 93 89 82 107*   Lipid Profile: No results for input(s): CHOL, HDL, LDLCALC, TRIG, CHOLHDL, LDLDIRECT in the last 72 hours. Thyroid Function Tests: No results for input(s): TSH, T4TOTAL, FREET4, T3FREE, THYROIDAB in the last 72 hours. Anemia Panel: No results for input(s): VITAMINB12, FOLATE, FERRITIN, TIBC, IRON, RETICCTPCT in the last 72 hours. Sepsis Labs: Recent Labs  Lab 04/23/18 1554 04/23/18 1740 04/24/18 0127 04/24/18 0436  LATICACIDVEN 5.2* 4.3* 2.8* 3.3*    Recent Results (from the past 240 hour(s))  Urine culture     Status: Abnormal   Collection Time: 04/23/18  3:40 PM  Result Value Ref Range Status   Specimen Description   Final    URINE, CATHETERIZED Performed at [facility], [address] [place], [place] [address]    Special Requests   Final    NONE Performed at [facility], [address] [place], [place] [address]    Culture >=100,000 COLONIES/mL PROTEUS  MIRABILIS (A)  Final   Report Status 04/25/2018 FINAL  Final   Organism ID, Bacteria PROTEUS MIRABILIS (A)  Final      Susceptibility   Proteus mirabilis - MIC*    AMPICILLIN <=2 SENSITIVE Sensitive     CEFAZOLIN <=4 SENSITIVE Sensitive     CEFTRIAXONE <=1 SENSITIVE Sensitive     CIPROFLOXACIN <=0.25 SENSITIVE Sensitive     GENTAMICIN <=1 SENSITIVE Sensitive     IMIPENEM 1 SENSITIVE Sensitive     NITROFURANTOIN 128 RESISTANT Resistant     TRIMETH/SULFA <=20 SENSITIVE Sensitive     AMPICILLIN/SULBACTAM <=2 SENSITIVE Sensitive     PIP/TAZO <=4 SENSITIVE Sensitive     * >=100,000 COLONIES/mL PROTEUS MIRABILIS  Culture, blood (routine x 2)     Status: Abnormal   Collection Time: 04/23/18  5:41 PM  Result Value Ref Range Status   Specimen Description   Final    RIGHT ANTECUBITAL Performed at [facility], [address] [place], [place] [address]    Special Requests   Final    BOTTLES DRAWN AEROBIC AND ANAEROBIC Blood Culture adequate volume Performed at [facility], [address] [place], [place] [address]    Culture  Setup Time   Final    GRAM NEGATIVE RODS IN BOTH AEROBIC AND ANAEROBIC BOTTLES Gram Stain Report Called to,Read Back By and Verified With: NESBITT,R@0815  BY MATTHEWS, B 5.12.19 Performed at [facility]    Culture (A)  Final    PROTEUS MIRABILIS SUSCEPTIBILITIES PERFORMED ON PREVIOUS CULTURE WITHIN THE LAST 5 DAYS. Performed at [facility], [place] [address], [place], [place] [address]    Report Status 04/26/2018 FINAL  Final  Culture, blood (routine x 2)     Status: Abnormal   Collection Time: 04/23/18  5:42 PM  Result Value Ref Range Status   Specimen Description   Final    BLOOD LEFT ARM Performed at [facility], [address], [place], [place] [address]    Special Requests   Final    BOTTLES DRAWN AEROBIC ONLY Blood Culture results may not be optimal due to an inadequate volume of blood received in culture bottles Performed at [facility] [place], [place] [address]    Culture  Setup Time   Final    GRAM NEGATIVE RODS AEROBIC BOTTLE ONLY Gram Stain Report Called to,Read Back By and Verified With: NESBITT,R@0917  BY MATTHEWS, B 5.12.19 Performed at [place] (A)  Final   Report Status 04/26/2018 FINAL  Final   Organism ID, Bacteria PROTEUS MIRABILIS  Final      Susceptibility   Proteus mirabilis - MIC*    AMPICILLIN <=2 SENSITIVE Sensitive     CEFAZOLIN <=4 SENSITIVE Sensitive     CEFEPIME <=1 SENSITIVE Sensitive     CEFTAZIDIME <=1 SENSITIVE Sensitive     CEFTRIAXONE <=1 SENSITIVE Sensitive     CIPROFLOXACIN <=0.25 SENSITIVE Sensitive     GENTAMICIN <=1 SENSITIVE Sensitive     IMIPENEM 1 SENSITIVE Sensitive     TRIMETH/SULFA <=20 SENSITIVE Sensitive     AMPICILLIN/SULBACTAM <=2 SENSITIVE Sensitive     PIP/TAZO <=4 SENSITIVE Sensitive     * PROTEUS MIRABILIS  Blood Culture ID Panel (Reflexed)     Status: Abnormal   Collection Time: 04/23/18  5:42 PM  Result Value Ref Range Status   Enterococcus species NOT DETECTED NOT DETECTED Final   Listeria monocytogenes NOT DETECTED NOT DETECTED Final   Staphylococcus species NOT DETECTED NOT DETECTED Final   Staphylococcus aureus NOT DETECTED NOT DETECTED Final   Streptococcus species NOT DETECTED NOT DETECTED Final   Streptococcus agalactiae NOT DETECTED NOT DETECTED Final   Streptococcus pneumoniae NOT DETECTED NOT DETECTED Final   Streptococcus pyogenes NOT DETECTED NOT DETECTED Final   Acinetobacter baumannii NOT DETECTED NOT DETECTED Final   Enterobacteriaceae species DETECTED (A) NOT DETECTED Final    Comment: Enterobacteriaceae represent a large family of gram-negative bacteria, not a single organism. CRITICAL RESULT CALLED TO, READ BACK BY AND VERIFIED WITH: NITA JOHNSTON PHRAMD AT [facility] AT 5732 ON [id number] BY SJW    Enterobacter cloacae complex NOT DETECTED NOT DETECTED Final   Escherichia coli NOT DETECTED NOT  DETECTED Final   Klebsiella oxytoca NOT DETECTED NOT DETECTED Final   Klebsiella pneumoniae NOT DETECTED NOT DETECTED Final   Proteus species DETECTED (A) NOT DETECTED Final    Comment: CRITICAL RESULT CALLED TO, READ BACK BY AND VERIFIED WITH: NICK HAYES PHARMD AT 1245 ON [id number] BY SJW    Serratia marcescens NOT DETECTED NOT DETECTED Final   Carbapenem resistance NOT DETECTED  NOT DETECTED Final   Haemophilus influenzae NOT DETECTED NOT DETECTED Final   Neisseria meningitidis NOT DETECTED NOT DETECTED Final   Pseudomonas aeruginosa NOT DETECTED NOT DETECTED Final   Candida albicans NOT DETECTED NOT DETECTED Final   Candida glabrata NOT DETECTED NOT DETECTED Final   Candida krusei NOT DETECTED NOT DETECTED Final   Candida parapsilosis NOT DETECTED NOT DETECTED Final   Candida tropicalis NOT DETECTED NOT DETECTED Final    Comment: Performed at [facility], [place] [address], [place], [place] [address]  MRSA PCR Screening     Status: None   Collection Time: 04/23/18  8:06 PM  Result Value Ref Range Status   MRSA by PCR NEGATIVE NEGATIVE Final    Comment:        The GeneXpert MRSA Assay (FDA approved for NASAL specimens only), is one component of a comprehensive MRSA colonization surveillance program. It is not intended to diagnose MRSA infection nor to guide or monitor treatment for MRSA infections. Performed at [facility], [address] [place], [place] [address]   Culture, blood (routine x 2)     Status: None (Preliminary result)   Collection Time: 04/25/18  7:46 AM  Result Value Ref Range Status   Specimen Description BLOOD RIGHT HAND  Final   Special Requests   Final    BOTTLES DRAWN AEROBIC AND ANAEROBIC Blood Culture adequate volume   Culture   Final    NO GROWTH 1 DAY Performed at [facility] [address] [place], [place] [address]    Report Status PENDING  Incomplete  Culture, blood (routine x 2)     Status: None (Preliminary result)   Collection  Time: 04/25/18  8:00 AM  Result Value Ref Range Status   Specimen Description BLOOD RIGHT ARM  Final   Special Requests   Final    BOTTLES DRAWN AEROBIC AND ANAEROBIC Blood Culture adequate volume   Culture   Final    NO GROWTH 1 DAY Performed at [facility], [place] [address] [place], [place] [address]    Report Status PENDING  Incomplete         Radiology Studies: No results found.      Scheduled Meds: . amantadine  100 mg Oral BID  . feeding supplement (ENSURE ENLIVE)  237 mL Oral BID BM  . gabapentin  300 mg Oral TID  . hydrALAZINE  10 mg Intravenous Q6H  . latanoprost  1 drop Both Eyes QHS  . multivitamin with minerals  1 tablet Oral Daily   Continuous Infusions: . cefTRIAXone (ROCEPHIN)  IV Stopped (04/26/18 1921)  . dextrose 5 % and 0.45% NaCl 60 mL/hr at 04/26/18 2155  . valproate sodium Stopped (04/26/18 2300)   And  . valproate sodium 250 mg (04/27/18 0914)     LOS: 4 days     Cordelia Poche, MD [facility] 04/27/2018, 12:49 PM Pager: [phone]  If 7PM-7AM, please contact night-coverage [email or web address] 04/27/2018, 12:49 PM

## 2018-04-27 NOTE — Care Management Important Message (Addendum)
Important Message  Patient Details  Name: Henry Smith MRN: [id number] Date of Birth: December 27, 1940   Medicare Important Message Given:  Yes This note is in error Patient did not sign due to illness?Unsigned copy with  the patient belongings  [name] 04/27/2018, 12:54 PM

## 2018-04-27 NOTE — Progress Notes (Signed)
Cortrak Tube Team Note:  Consult received to place a Cortrak feeding tube. Please also seen note by this RD this AM at 10:25 AM. Earlier this afternoon RD talked with RN and plan at that time was to proceed with Cortrak placement as pt was not doing well with PO intakes after initial SLP evaluation.  When RD arrived to the floor RN stated plan for SLP re-evaluation. RD present in pt's room during re-evaluation. Based on pt's performance during that time in conjunction with concern of continued poor PO intake should pt become full from TF, decision was made by RN, RD, and SLP for Cortrak placement and to hold on TF initiation to continue to cue PO intakes.   RN remained at bedside during the entirety of procedure to assist RD.   A 10 F Cortrak tube was placed in the R nare and was able to passed into the stomach without resistance, but pt became increasingly agitated throughout procedure, especially once attempt was made to secure tube with a bridle. Pt was vigorously shaking head, grinding teeth, and thrashing in the bed. After approximately 10 minutes and d/t escalation of agitation, inability to secure bridle with pt's movements, RD and RN decided to stop procedure and remove the Cortrak tube as it was felt that without a bridle pt would pull tube out quickly. RN reported plan to page Dr. Lonny Prude and make him aware.      Jarome Matin, MS, RD, LDN, [facility] Inpatient Clinical Dietitian Pager # [phone] After hours/weekend pager # [phone]

## 2018-04-27 NOTE — Progress Notes (Signed)
  Speech Language Pathology Treatment: Dysphagia;Cognitive-Linquistic  Patient Details Name: Henry Smith MRN: [id number] DOB: 1941/06/13 Today's Date: 04/27/2018 Time: 4580-9983 SLP Time Calculation (min) (ACUTE ONLY): 20 min  Assessment / Plan / Recommendation Clinical Impression  Pt demonstrates significant improvement in automaticity and response to PO. SLP utilized automatic language tasks to engage pt with 2-3 instances of pt attempts to spontaneously verbalize. Pt responded Y without any accuracy several times in session. WIth hand over hand assist with self feeding pt appropriately initiate oral response to cups, straw, ICEE and bottle. Automatic oral phase and timely consecutive swallow response without signs of aspiration. Pt also allowed self feeding of cracker with slow but complete mastication. Recommend pt initiate a mechanical soft with with thin liquids with assist with self feeding, hand over hand assist may be needed. Discussed with RN. If pt can accept meals and meds today will plan to hold off on Cortrak.   HPI HPI: Pt is a 77 y.o. male admitted 04/23/18 with AMS, including non-verbal, not walking and R-side facial droop. Worked up for encephalopathy secondary to sepsis due to UTI, AKI. Head CT and MRI show no acute intracranial finding; mild small vessel change normal for age. PMH includes dementia, schizophrenia, prostate CA      SLP Plan  Continue with current plan of care       Recommendations  Diet recommendations: Dysphagia 3 (mechanical soft);Thin liquid Liquids provided via: Cup;Straw Medication Administration: Whole meds with liquid Supervision: Staff to assist with self feeding;Full supervision/cueing for compensatory strategies;Trained caregiver to feed patient Postural Changes and/or Swallow Maneuvers: Seated upright 90 degrees                Oral Care Recommendations: Oral care QID Follow up Recommendations: Skilled Nursing facility SLP Visit  Diagnosis: Cognitive communication deficit (R41.841);Dysphagia, unspecified (R13.10) Plan: Continue with current plan of care       GO               [facility], MA CCC-SLP [phone]  Lynann Beaver 04/27/2018, 10:19 AM

## 2018-04-27 NOTE — Progress Notes (Signed)
  Speech Language Pathology Treatment: Dysphagia  Patient Details Name: Henry Smith MRN: [id number] DOB: Jan 06, 1941 Today's Date: 04/27/2018 Time: 4970-2637 SLP Time Calculation (min) (ACUTE ONLY): 25 min  Assessment / Plan / Recommendation Clinical Impression  SLP returned to reassess pt given pts inconsistent intake with RN and to determine need for Cortrak. SLP repositioned pt, asked him if he was thirsty, pt stated "I want some water." Pt still quite automatic with consecutive cup sips of water, consumed 6oz with immediate swallows, no oral holding. Hand over hand assist provided, pt attentive to cup. When given trials of a soft cake, pt demonstrated obvious groping for oral transit. Liquid wash of ensure given with intermittent swallows and intermittent oral holding. Posterior head tilt and cup applied to lips assisted in trigger in 1/4 trials. Suction applied to remove oral residue.  Presentation strongly consistent with apraxia, (possibly dementia related). Thirst, hunger and self feeding cues needed to facilitate automaticity with swallowing. Pt would benefit from temporary consistent access to non oral medication route, but would encourage oral diet without tube feeding to progress pt back to oral intake if possible. Will downgrade diet to full liquids. Encourage staff to attempt cold, sweet liquids with appropriate hand over hand assist and cues. Will follow for progress.   HPI HPI: Pt is a 77 y.o. male admitted 04/23/18 with AMS, including non-verbal, not walking and R-side facial droop. Worked up for encephalopathy secondary to sepsis due to UTI, AKI. Head CT and MRI show no acute intracranial finding; mild small vessel change normal for age. PMH includes dementia, schizophrenia, prostate CA      SLP Plan  Continue with current plan of care       Recommendations  Diet recommendations: Thin liquid Liquids provided via: Cup;Straw Medication Administration: Via alternative  means Postural Changes and/or Swallow Maneuvers: Seated upright 90 degrees                Follow up Recommendations: Skilled Nursing facility SLP Visit Diagnosis: Cognitive communication deficit (R41.841);Dysphagia, unspecified (R13.10) Plan: Continue with current plan of care       GO               [facility], MA CCC-SLP [phone]  Lynann Beaver 04/27/2018, 3:21 PM

## 2018-04-27 NOTE — Progress Notes (Signed)
Initial Nutrition Assessment  DOCUMENTATION CODES:   Obesity unspecified  INTERVENTION:  - Will order Ensure Enlive po BID, each supplement provides 350 kcal and 20 grams of protein. - Will order daily multivitamin with minerals. - Encourage PO intakes. - RN/tech to assist with feeding at meals.   NUTRITION DIAGNOSIS:   Inadequate oral intake related to lethargy/confusion, acute illness as evidenced by other (comment)(currently NPO with planned diet advancement for lunch, needs feeding assistance).  GOAL:   Patient will meet greater than or equal to 90% of their needs  MONITOR:   PO intake, Supplement acceptance, Weight trends, Labs  REASON FOR ASSESSMENT:   Other (Comment)(Consult for Cortrak)  ASSESSMENT:   77 y.o. male, with history of schizophrenia, dementia associated with neurosyphilis, prostate cancer was brought to hospital with altered mental status. Patient was initially nonverbal. Patient has been nonverbal drooping, right-sided facial droop not able to walk as per nursing home staff thought that has "schizophrenia might be acting up". Patient was sent to ED for further evaluation. Patient was found to have UTI, lactic acidosis, creatinine 1.5, admitted for further evaluation.  Overnight was noted to have motor aphasia with worsening right-sided hemiparesis, patient was transferred to [facility] for MRI brain to rule out CVA.  Diet advanced from NPO to Dysphagia 3, thin liquids about 10 minutes ago. RD communicated with RN and when RD arrived to pt's room SLP was finishing bedside swallow evaluation. SLP reported pt tolerating trialed items well but that he will require feeding assistance. RD and SLP then communicated findings with RN; plan to monitor intakes until this afternoon and then determine if Cortrak placement remains warranted.   No family/visitors at bedside to provide PTA information and pt remains nonverbal at this time. He did not nod or shake head in  response to any questions while RD was present. Per chart review, current weight is +20 lbs compared to weight on 10/27/17.   Medications reviewed. Labs reviewed; Na: 147 mmol/L, Cl: 117 mmol/L, Ca: 12.4 mg/dL.  IVF: D5-1/2 NS @ 60 mL/hr (245 kcal).     NUTRITION - FOCUSED PHYSICAL EXAM:  Completed; no muscle and no fat wasting noted, mild edema to BLE.  Diet Order:   Diet Order           DIET DYS 3 Room service appropriate? Yes; Fluid consistency: Thin  Diet effective now          EDUCATION NEEDS:   No education needs have been identified at this time  Skin:  Skin Assessment: Reviewed RN Assessment  Last BM:  PTA/unknown  Height:   Ht Readings from Last 1 Encounters:  04/23/18 5\' 11"  (1.803 m)    Weight:   Wt Readings from Last 1 Encounters:  04/24/18 217 lb 6 oz (98.6 kg)    Ideal Body Weight:  78.18 kg  BMI:  Body mass index is 30.32 kg/m.  Estimated Nutritional Needs:   Kcal:  1775-1970 (18-20 kcal/kg)  Protein:  110-120 grams  Fluid:  >/= 2 L/day      Henry Matin, MS, RD, LDN, [facility] Inpatient Clinical Dietitian Pager # [phone] After hours/weekend pager # [phone]

## 2018-04-28 ENCOUNTER — Inpatient Hospital Stay (HOSPITAL_COMMUNITY): Payer: Medicare Other

## 2018-04-28 DIAGNOSIS — G9340 Encephalopathy, unspecified: Secondary | ICD-10-CM

## 2018-04-28 LAB — GLUCOSE, CAPILLARY
GLUCOSE-CAPILLARY: 103 mg/dL — AB (ref 65–99)
GLUCOSE-CAPILLARY: 87 mg/dL (ref 65–99)
GLUCOSE-CAPILLARY: 94 mg/dL (ref 65–99)
Glucose-Capillary: 116 mg/dL — ABNORMAL HIGH (ref 65–99)
Glucose-Capillary: 99 mg/dL (ref 65–99)
Glucose-Capillary: 131 mg/dL — ABNORMAL HIGH (ref 65–99)

## 2018-04-28 LAB — BASIC METABOLIC PANEL
ANION GAP: 7 (ref 5–15)
BUN: 17 mg/dL (ref 6–20)
CALCIUM: 12.5 mg/dL — AB (ref 8.9–10.3)
CO2: 28 mmol/L (ref 22–32)
Chloride: 111 mmol/L (ref 101–111)
Creatinine, Ser: 1 mg/dL (ref 0.61–1.24)
GFR calc Af Amer: >60 mL/min (ref >60)
Glucose, Bld: 153 mg/dL — ABNORMAL HIGH (ref 65–99)
Potassium: 3.6 mmol/L (ref 3.5–5.1)
SODIUM: 146 mmol/L — AB (ref 135–145)

## 2018-04-28 LAB — CBC
HCT: 44.5 % (ref 39.0–52.0)
HEMOGLOBIN: 14.5 g/dL (ref 13.0–17.0)
MCH: 27.6 pg (ref 26.0–34.0)
MCHC: 32.6 g/dL (ref 30.0–36.0)
MCV: 84.6 fL (ref 78.0–100.0)
Platelets: 95 10*3/uL — ABNORMAL LOW (ref 150–400)
RBC: 5.26 MIL/uL (ref 4.22–5.81)
RDW: 15.4 % (ref 11.5–15.5)
WBC: 9 10*3/uL (ref 4.0–10.5)

## 2018-04-28 NOTE — Progress Notes (Signed)
Patient is alert,  Not following command.  Does not attempt to swallow when liquid was given.  Dr Lonny Prude notified.

## 2018-04-28 NOTE — Progress Notes (Signed)
From the beginning of shift, patient was non verbal, not following commands. Really unable to do NIHSS. He would not drink water,

## 2018-04-28 NOTE — Progress Notes (Signed)
PROGRESS NOTE    Henry Smith  KGU:[id number] DOB: 04/07/1941 DOA: 04/23/2018 PCP: Caprice Renshaw, MD   Brief Narrative: Henry Smith is a 77 y.o. male with a history of schizophrenia, dementia associate with neurosyphilis, prostate cancer, hypertension.  Patient presented secondary to worsening mental status, including becoming nonverbal with facial droop.  Initial concern for stroke, however, MRI was negative.  He was found to have a UTI with associated bacteremia and is currently on antibiotic therapy and the coronary.   Assessment & Plan:   Principal Problem:   Altered mental status, unspecified Active Problems:   Schizoaffective disorder, bipolar type (HCC)   Sepsis secondary to UTI [facility])   UTI (urinary tract infection)   Acute metabolic encephalopathy   Proteus mirabilis infection   Bacteremia   Sepsis due to Gram negative bacteria (HCC)   Sepsis Bacteremia/UTI secondary to Proteus Mirabilis Sensitive to ceftriaxone. -Continue ceftriaxone IV secondary to n.p.o. status  Acute metabolic encephalopathy In setting of acute infection in setting of underlying dementia secondary to neurosyphilis.  Patient is far from baseline.  MRI was negative for acute finding that could explain his mental status change. -Continue treatment as mentioned above for infection -EEG  Schizoaffective disorder Psychiatry consulted for evaluation. -Continue Depakote  Dysphasia Unknown etiology.  Possibly related to underlying dementia and schizophrenia.  Did not tolerate Cortrak placement yesterday. -Continue IV fluids -SLP recommendations  Acute kidney injury Resolved.  Essential hypertension Uncontrolled.  Not on oral medication secondary to n.p.o. status -Continue hydralazine 10 mg every 6 hours IV  Thrombocytopenia Chronic. Stable.  Hypernatremia Mild. Secondary to poor oral intake. Improved slightly -Encourage oral intake -Repeat BMP -If still with issues with oral  intake, will need to restart IV fluids and possibly alternate means of nutrition (PEG)   DVT prophylaxis: Lovenox Code Status:   Code Status: Full Code Family Communication: None at bedside. Disposition Plan: Discharge to SNF when medically stable   Consultants:   Neurology  Procedures:   None  Antimicrobials:  Levaquin (5/11)  Aztreonam (5/11>>5/12)  Ceftriaxone (5/12>>   Subjective: No issues overnight  Objective: Vitals:   04/27/18 2041 04/28/18 0052 04/28/18 0500 04/28/18 0735  BP: (!) 178/103 (!) 164/99 (!) 173/90 (!) 155/77  Pulse: 80 69 81 91  Resp: 16 20 20 16   Temp: 98.1 F (36.7 C) 98.2 F (36.8 C) 98.2 F (36.8 C) 98.1 F (36.7 C)  TempSrc: Oral Oral Axillary Oral  SpO2: 98% 98% 99% 100%  Weight:      Height:        Intake/Output Summary (Last 24 hours) at 04/28/2018 0953 Last data filed at 04/28/2018 0645 Gross per 24 hour  Intake -  Output 2000 ml  Net -2000 ml   Filed Weights   04/23/18 1429 04/23/18 2025 04/24/18 0548  Weight: 90.7 kg (200 lb) 98.2 kg (216 lb 7.9 oz) 98.6 kg (217 lb 6 oz)    Examination:  General exam: Appears calm and comfortable Respiratory system: Clear to auscultation. Respiratory effort normal. Cardiovascular system: S1 & S2 heard, RRR. No murmurs. Gastrointestinal system: Abdomen is nondistended, soft and nontender. No organomegaly or masses felt. Normal bowel sounds heard. Central nervous system: Alert. No withdrawal to pain. Tongue flicking. Extremities: No edema. No calf tenderness Skin: No cyanosis. No rashes Psychiatry: Flat affect    Data Reviewed: I have personally reviewed following labs and imaging studies  CBC: Recent Labs  Lab 04/23/18 1554 04/24/18 0436 04/27/18 0808 04/28/18 0335  WBC 15.6* 11.4* 8.2 9.0  NEUTROABS 14.0  --   --   --   HGB 15.9 14.0 14.5 14.5  HCT 48.1 44.7 44.8 44.5  MCV 85.4 88.2 85.3 84.6  PLT 82* 77* 83* 95*   Basic Metabolic Panel: Recent Labs  Lab  04/24/18 0127 04/24/18 0436 04/25/18 0440 04/27/18 0808 04/28/18 0335  NA 140 139 144 147* 146*  K 4.5 4.8 4.1 3.8 3.6  CL 108 107 113* 117* 111  CO2 26 26 25 26 28   GLUCOSE 95 92 104* 100* 153*  BUN 25* 26* 25* 17 17  CREATININE 1.45* 1.34* 1.06 0.85 1.00  CALCIUM 10.8* 10.9* 11.4* 12.4* 12.5*   GFR: Estimated Creatinine Clearance: 77.6 mL/min (by C-G formula based on SCr of 1 mg/dL). Liver Function Tests: Recent Labs  Lab 04/23/18 1554 04/24/18 0436  AST 36 29  ALT 36 31  ALKPHOS 45 40  BILITOT 0.8 0.8  PROT 6.8 5.9*  ALBUMIN 3.2* 2.6*   No results for input(s): LIPASE, AMYLASE in the last 168 hours. No results for input(s): AMMONIA in the last 168 hours. Coagulation Profile: Recent Labs  Lab 04/23/18 1554  INR 1.23   Cardiac Enzymes: Recent Labs  Lab 04/23/18 1554  CKTOTAL 74  TROPONINI <0.03   BNP (last 3 results) No results for input(s): PROBNP in the last 8760 hours. HbA1C: No results for input(s): HGBA1C in the last 72 hours. CBG: Recent Labs  Lab 04/27/18 1611 04/27/18 2044 04/28/18 0054 04/28/18 0446 04/28/18 0733  GLUCAP 119* 101* 103* 94 87   Lipid Profile: No results for input(s): CHOL, HDL, LDLCALC, TRIG, CHOLHDL, LDLDIRECT in the last 72 hours. Thyroid Function Tests: No results for input(s): TSH, T4TOTAL, FREET4, T3FREE, THYROIDAB in the last 72 hours. Anemia Panel: No results for input(s): VITAMINB12, FOLATE, FERRITIN, TIBC, IRON, RETICCTPCT in the last 72 hours. Sepsis Labs: Recent Labs  Lab 04/23/18 1554 04/23/18 1740 04/24/18 0127 04/24/18 0436  LATICACIDVEN 5.2* 4.3* 2.8* 3.3*    Recent Results (from the past 240 hour(s))  Urine culture     Status: Abnormal   Collection Time: 04/23/18  3:40 PM  Result Value Ref Range Status   Specimen Description   Final    URINE, CATHETERIZED Performed at [facility], [address] [place], [place] [address]    Special Requests   Final    NONE Performed at [facility],  [address], [place], [place] [address]    Culture >=100,000 COLONIES/mL PROTEUS MIRABILIS (A)  Final   Report Status 04/25/2018 FINAL  Final   Organism ID, Bacteria PROTEUS MIRABILIS (A)  Final      Susceptibility   Proteus mirabilis - MIC*    AMPICILLIN <=2 SENSITIVE Sensitive     CEFAZOLIN <=4 SENSITIVE Sensitive     CEFTRIAXONE <=1 SENSITIVE Sensitive     CIPROFLOXACIN <=0.25 SENSITIVE Sensitive     GENTAMICIN <=1 SENSITIVE Sensitive     IMIPENEM 1 SENSITIVE Sensitive     NITROFURANTOIN 128 RESISTANT Resistant     TRIMETH/SULFA <=20 SENSITIVE Sensitive     AMPICILLIN/SULBACTAM <=2 SENSITIVE Sensitive     PIP/TAZO <=4 SENSITIVE Sensitive     * >=100,000 COLONIES/mL PROTEUS MIRABILIS  Culture, blood (routine x 2)     Status: Abnormal   Collection Time: 04/23/18  5:41 PM  Result Value Ref Range Status   Specimen Description   Final    RIGHT ANTECUBITAL Performed at [facility], [address] [place], [place] [address]    Special Requests  Final    BOTTLES DRAWN AEROBIC AND ANAEROBIC Blood Culture adequate volume Performed at [facility], [address] [place], [place] [address]    Culture  Setup Time   Final    GRAM NEGATIVE RODS IN BOTH AEROBIC AND ANAEROBIC BOTTLES Gram Stain Report Called to,Read Back By and Verified With: NESBITT,R@0815  BY MATTHEWS, B 5.12.19 Performed at [facility]    Culture (A)  Final    PROTEUS MIRABILIS SUSCEPTIBILITIES PERFORMED ON PREVIOUS CULTURE WITHIN THE LAST 5 DAYS. Performed at [facility], [place] [address] [place], [place] [address]    Report Status 04/26/2018 FINAL  Final  Culture, blood (routine x 2)     Status: Abnormal   Collection Time: 04/23/18  5:42 PM  Result Value Ref Range Status   Specimen Description   Final    BLOOD LEFT ARM Performed at [facility], [address] [place], [place] [address]    Special Requests   Final    BOTTLES DRAWN AEROBIC ONLY Blood Culture results may not be optimal due to an  inadequate volume of blood received in culture bottles Performed at [place] [place], [place] [address]    Culture  Setup Time   Final    GRAM NEGATIVE RODS AEROBIC BOTTLE ONLY Gram Stain Report Called to,Read Back By and Verified With: NESBITT,R@0917  BY MATTHEWS, B 5.12.19 Performed at [place] (A)  Final   Report Status 04/26/2018 FINAL  Final   Organism ID, Bacteria PROTEUS MIRABILIS  Final      Susceptibility   Proteus mirabilis - MIC*    AMPICILLIN <=2 SENSITIVE Sensitive     CEFAZOLIN <=4 SENSITIVE Sensitive     CEFEPIME <=1 SENSITIVE Sensitive     CEFTAZIDIME <=1 SENSITIVE Sensitive     CEFTRIAXONE <=1 SENSITIVE Sensitive     CIPROFLOXACIN <=0.25 SENSITIVE Sensitive     GENTAMICIN <=1 SENSITIVE Sensitive     IMIPENEM 1 SENSITIVE Sensitive     TRIMETH/SULFA <=20 SENSITIVE Sensitive     AMPICILLIN/SULBACTAM <=2 SENSITIVE Sensitive     PIP/TAZO <=4 SENSITIVE Sensitive     * PROTEUS MIRABILIS  Blood Culture ID Panel (Reflexed)     Status: Abnormal   Collection Time: 04/23/18  5:42 PM  Result Value Ref Range Status   Enterococcus species NOT DETECTED NOT DETECTED Final   Listeria monocytogenes NOT DETECTED NOT DETECTED Final   Staphylococcus species NOT DETECTED NOT DETECTED Final   Staphylococcus aureus NOT DETECTED NOT DETECTED Final   Streptococcus species NOT DETECTED NOT DETECTED Final   Streptococcus agalactiae NOT DETECTED NOT DETECTED Final   Streptococcus pneumoniae NOT DETECTED NOT DETECTED Final   Streptococcus pyogenes NOT DETECTED NOT DETECTED Final   Acinetobacter baumannii NOT DETECTED NOT DETECTED Final   Enterobacteriaceae species DETECTED (A) NOT DETECTED Final    Comment: Enterobacteriaceae represent a large family of gram-negative bacteria, not a single organism. CRITICAL RESULT CALLED TO, READ BACK BY AND VERIFIED WITH: NITA JOHNSTON PHRAMD AT [facility] AT 9798 ON [id number] BY SJW    Enterobacter  cloacae complex NOT DETECTED NOT DETECTED Final   Escherichia coli NOT DETECTED NOT DETECTED Final   Klebsiella oxytoca NOT DETECTED NOT DETECTED Final   Klebsiella pneumoniae NOT DETECTED NOT DETECTED Final   Proteus species DETECTED (A) NOT DETECTED Final    Comment: CRITICAL RESULT CALLED TO, READ BACK BY AND VERIFIED WITH: NICK HAYES PHARMD AT 1245 ON [id number] BY SJW  Serratia marcescens NOT DETECTED NOT DETECTED Final   Carbapenem resistance NOT DETECTED NOT DETECTED Final   Haemophilus influenzae NOT DETECTED NOT DETECTED Final   Neisseria meningitidis NOT DETECTED NOT DETECTED Final   Pseudomonas aeruginosa NOT DETECTED NOT DETECTED Final   Candida albicans NOT DETECTED NOT DETECTED Final   Candida glabrata NOT DETECTED NOT DETECTED Final   Candida krusei NOT DETECTED NOT DETECTED Final   Candida parapsilosis NOT DETECTED NOT DETECTED Final   Candida tropicalis NOT DETECTED NOT DETECTED Final    Comment: Performed at [facility], [place] [address] [place], [place] [address]  MRSA PCR Screening     Status: None   Collection Time: 04/23/18  8:06 PM  Result Value Ref Range Status   MRSA by PCR NEGATIVE NEGATIVE Final    Comment:        The GeneXpert MRSA Assay (FDA approved for NASAL specimens only), is one component of a comprehensive MRSA colonization surveillance program. It is not intended to diagnose MRSA infection nor to guide or monitor treatment for MRSA infections. Performed at [facility], [address] [place], [place] [address]   Culture, blood (routine x 2)     Status: None (Preliminary result)   Collection Time: 04/25/18  7:46 AM  Result Value Ref Range Status   Specimen Description BLOOD RIGHT HAND  Final   Special Requests   Final    BOTTLES DRAWN AEROBIC AND ANAEROBIC Blood Culture adequate volume   Culture   Final    NO GROWTH 2 DAYS Performed at [place] [address] [place], [place] [address]    Report Status PENDING   Incomplete  Culture, blood (routine x 2)     Status: None (Preliminary result)   Collection Time: 04/25/18  8:00 AM  Result Value Ref Range Status   Specimen Description BLOOD RIGHT ARM  Final   Special Requests   Final    BOTTLES DRAWN AEROBIC AND ANAEROBIC Blood Culture adequate volume   Culture   Final    NO GROWTH 2 DAYS Performed at [facility], [address], [place],  [address]    Report Status PENDING  Incomplete         Radiology Studies: No results found.      Scheduled Meds: . amantadine  100 mg Oral BID  . feeding supplement (ENSURE ENLIVE)  237 mL Oral BID BM  . gabapentin  300 mg Oral TID  . hydrALAZINE  10 mg Intravenous Q6H  . latanoprost  1 drop Both Eyes QHS  . multivitamin with minerals  1 tablet Oral Daily   Continuous Infusions: . cefTRIAXone (ROCEPHIN)  IV 2 g (04/27/18 1633)  . dextrose 5 % and 0.45% NaCl 60 mL/hr at 04/26/18 2155  . valproate sodium Stopped (04/28/18 0100)   And  . valproate sodium Stopped (04/28/18 0532)     LOS: 5 days     Cordelia Poche, MD [facility] 04/28/2018, 9:53 AM Pager: [phone]  If 7PM-7AM, please contact night-coverage [email or web address] 04/28/2018, 9:53 AM

## 2018-04-28 NOTE — Progress Notes (Signed)
  Speech Language Pathology Treatment: Dysphagia  Patient Details Name: Henry Smith MRN: [id number] DOB: 27-Feb-1941 Today's Date: 04/28/2018 Time: 5009-3818 SLP Time Calculation (min) (ACUTE ONLY): 31 min  Assessment / Plan / Recommendation Clinical Impression  Pt seen in am with meal at bedside. SLP repositioned, played music and sang to pt. Pt alert occasionally verbalizing unintelligibly, more frequent than yesterday. Seemed to light up when a Harriett Rush song came on, talked quite a bit. Also filled in words to song with a visual cue "can dance!"  During this interaction pt consumed 3/4 of a strawberry ensure, 100% of a vanilla ice cream, 3 oz of ice water and 2/3 of a bowl of grits. Hand over hand assist, sensory feedback with liquids touched to lips, visual cues of spoon coming to mouth and liquid washes all facilitated intake. Most importantly, pt appeared hungry and thirsty and occasionally nodded yes to offering of PO. No oral holding occurred, no suction needed.  Important to set pt up for success. He must be positioned upright, he needs to hold his cup with assist (he was unsuccessful with a straw). He must be fully alert. If he gives cues that he is not hungry or thirsty, like pursing lips shut or orally holding bolus, stop feeding and try again in an hour or two.  Unsure about pts ability to take meds.   Will continue to follow to advance diet as tolerated.   HPI HPI: Pt is a 77 y.o. male admitted 04/23/18 with AMS, including non-verbal, not walking and R-side facial droop. Worked up for encephalopathy secondary to sepsis due to UTI, AKI. Head CT and MRI show no acute intracranial finding; mild small vessel change normal for age. PMH includes dementia, schizophrenia, prostate CA      SLP Plan  Continue with current plan of care       Recommendations  Diet recommendations: Thin liquid Liquids provided via: Cup Medication Administration: Crushed with puree Supervision: Full  supervision/cueing for compensatory strategies Compensations: Follow solids with liquid;Minimize environmental distractions Postural Changes and/or Swallow Maneuvers: Seated upright 90 degrees                Follow up Recommendations: Skilled Nursing facility SLP Visit Diagnosis: Cognitive communication deficit (R41.841);Dysphagia, unspecified (R13.10) Plan: Continue with current plan of care       [place] Kiana Hollar, MA CCC-SLP [phone]  Lynann Beaver 04/28/2018, 9:52 AM

## 2018-04-28 NOTE — Progress Notes (Signed)
eeg complete - results pending.

## 2018-04-28 NOTE — Progress Notes (Signed)
OT Cancellation Note  Patient Details Name: Henry Smith MRN: [id number] DOB: 1941-11-29   Cancelled Treatment:    Reason Eval/Treat Not Completed: Patient at procedure or test/ unavailable (EEG); will follow up as schedule allows.  Lou Cal, OT Pager [phone] 04/28/2018   Raymondo Band 04/28/2018, 11:06 AM

## 2018-04-28 NOTE — Progress Notes (Addendum)
Occupational Therapy Treatment Patient Details Name: Henry Smith MRN: [id number] DOB: May 27, 1941 Today's Date: 04/28/2018    History of present illness Pt is a 77 y.o. male admitted 04/23/18 with AMS, including non-verbal, not walking and R-side facial droop. Worked up for encephalopathy secondary to sepsis due to UTI, AKI. Head CT and MRI show no acute intracranial finding; mild small vessel change normal for age. PMH includes dementia, schizophrenia, prostate CA.   OT comments  Pt with slow progress towards OT goals. Pt continues to require MaxA+2 for bed mobility and totalA for ADL completion at bed level. Pt intermittently responsive to therapist's questions this session mostly using head nods when responding; following one step commands inconsistently and overall requiring hand over hand assist to complete simple grooming task at bed level. Positioned in sidelying end of session for pressure relief. Feel POC remains appropriate at this time. Will continue to follow acutely to progress pt towards established OT goals.    Follow Up Recommendations  SNF    Equipment Recommendations  None recommended by OT          Precautions / Restrictions Precautions Precautions: Fall Restrictions Weight Bearing Restrictions: No       Mobility Bed Mobility Overal bed mobility: Needs Assistance Bed Mobility: Rolling Rolling: Max assist;+2 for physical assistance;+2 for safety/equipment         General bed mobility comments: requires MaxA+2 for rolling to L/R and to scoot to [facility]                                                                    ADL either performed or assessed with clinical judgement   ADL Overall ADL's : Needs assistance/impaired     Grooming: Total assistance;Bed level;Wash/dry face;Maximal assistance Grooming Details (indicate cue type and reason): pt requiring max-totalA to wash face at bed level including maxA for hand over hand  to complete task; pt noted to be drooling upon entering room, use of suction for oral care                                General ADL Comments: pt continues to follow commands inconsistently, minimally following one step commands and requiring totalA for ADL tasks; pt repositioned in sidelying end of session      Vision   Additional Comments: pt able to track and make eye contact with therapist               Cognition Arousal/Alertness: Awake/alert Behavior During Therapy: Flat affect Overall Cognitive Status: No family/caregiver present to determine baseline cognitive functioning Area of Impairment: Attention;Following commands;Awareness                   Current Attention Level: Focused   Following Commands: Follows one step commands inconsistently   Awareness: Intellectual   General Comments: pt follows minimal one step commands, though intermittently responds to therapist's questions with head nods        Exercises Other Exercises Other Exercises: performed PROM to bil UEs                 Pertinent Vitals/ Pain       Pain Assessment: Faces Faces Pain Scale:  No hurt Pain Intervention(s): Monitored during session                                                          Frequency  Min 2X/week        Progress Toward Goals  OT Goals(current goals can now be found in the care plan section)  Progress towards OT goals: OT to reassess next treatment  Acute Rehab OT Goals Patient Stated Goal: Unstated OT Goal Formulation: Patient unable to participate in goal setting Time For Goal Achievement: 05/08/18 Potential to Achieve Goals: [place] Discharge plan remains appropriate                     AM-PAC PT "6 Clicks" Daily Activity     Outcome Measure   Help from another person eating meals?: A Lot Help from another person taking care of personal grooming?: A Lot Help from another person toileting,  which includes using toliet, bedpan, or urinal?: Total Help from another person bathing (including washing, rinsing, drying)?: A Lot Help from another person to put on and taking off regular upper body clothing?: A Lot Help from another person to put on and taking off regular lower body clothing?: Total 6 Click Score: 10    End of Session    OT Visit Diagnosis: Unsteadiness on feet (R26.81);Other abnormalities of gait and mobility (R26.89);Muscle weakness (generalized) (M62.81);Other symptoms and signs involving cognitive function   Activity Tolerance Patient tolerated treatment well;Other (comment)(limited due to cognitive deficits )   Patient Left in bed;with call bell/phone within reach;with bed alarm set   Nurse Communication Mobility status        Time: 1103-1594 OT Time Calculation (min): 22 min  Charges: OT General Charges $OT Visit: 1 Visit OT Treatments $Self Care/Home Management : 8-22 mins  Lou Cal, OT Pager [phone] 04/28/2018    Raymondo Band 04/28/2018, 4:25 PM

## 2018-04-28 NOTE — Procedures (Signed)
HPI:  77 y/o with history of schizophrenia and dementia presents with increasing mental status change.  TECHNICAL SUMMARY:  A multichannel referential and bipolar montage EEG using the standard international 10-20 system was performed on the patient described as uncooperative.  7 Hz activity can be seen throughout the posterior head regions.  4 to 5 Hz activity is noted intermixed throughout all head regions.      ACTIVATION:  Stepwise photic stimulation and hyperventilation are not performed.  EPILEPTIFORM ACTIVITY:  There were no spikes, sharp waves or paroxysmal activity.  SLEEP: Physiologic drowsiness is noted, but no stage II sleep.  CARDIAC:  The EKG lead revealed a tachycardic rhythm at 120 bpm.    IMPRESSION:  This is an abnormal EEG demonstrating a mild to moderate diffuse slowing of electrocerebral activity.  This can be seen in a wide variety of encephalopathic state including those of a toxic, metabolic, or degenerative nature.  There were no focal, hemispheric, or lateralizing features.  No epileptiform activity was recorded.

## 2018-04-29 LAB — GLUCOSE, CAPILLARY
GLUCOSE-CAPILLARY: 97 mg/dL (ref 65–99)
Glucose-Capillary: 114 mg/dL — ABNORMAL HIGH (ref 65–99)
Glucose-Capillary: 83 mg/dL (ref 65–99)
Glucose-Capillary: 99 mg/dL (ref 65–99)

## 2018-04-29 LAB — BASIC METABOLIC PANEL
Anion gap: 5 (ref 5–15)
BUN: 18 mg/dL (ref 6–20)
CHLORIDE: 117 mmol/L — AB (ref 101–111)
CO2: 27 mmol/L (ref 22–32)
CREATININE: 0.82 mg/dL (ref 0.61–1.24)
Calcium: 13 mg/dL — ABNORMAL HIGH (ref 8.9–10.3)
GFR calc Af Amer: >60 mL/min (ref >60)
GFR calc non Af Amer: >60 mL/min (ref >60)
Glucose, Bld: 97 mg/dL (ref 65–99)
POTASSIUM: 3.5 mmol/L (ref 3.5–5.1)
Sodium: 149 mmol/L — ABNORMAL HIGH (ref 135–145)

## 2018-04-29 NOTE — Progress Notes (Signed)
Physical Therapy Treatment Patient Details Name: Henry Smith MRN: [id number] DOB: [birth year] Today's Date: 04/29/2018    History of Present Illness Pt is a 77 y.o. male admitted 04/23/18 with AMS, including non-verbal, not walking and R-side facial droop. Worked up for encephalopathy secondary to sepsis due to UTI, AKI. Head CT and MRI show no acute intracranial finding; mild small vessel change normal for age. PMH includes dementia, schizophrenia, prostate CA.    PT Comments    Patient seen for mobility progression. Pt replied "fine" when asked how he is feeling today upon arrival however non verbal the rest of this session. Pt assisted minimally with functional tasks only when tasks were initiated by therapist and not following commands otherwise. Nursing staff will need mechanical lift for transfers OOB. Continue to progress as tolerated with anticipated d/c to SNF for further skilled PT services.     Follow Up Recommendations  SNF;Supervision/Assistance - 24 hour     Equipment Recommendations  (TBD)    Recommendations for Other Services       Precautions / Restrictions Precautions Precautions: Fall Restrictions Weight Bearing Restrictions: No    Mobility  Bed Mobility Overal bed mobility: Needs Assistance Bed Mobility: Rolling;Sidelying to Sit Rolling: Max assist;+2 for physical assistance(pt required less assist to roll toward R side vs L side) Sidelying to sit: Max assist;+2 for physical assistance       General bed mobility comments: pt began assisting minimally with rolling to R side and with elevating trunk into sitting when task initiated by therapist   Transfers Overall transfer level: Needs assistance   Transfers: Lateral/Scoot Transfers          Lateral/Scoot Transfers: Max assist;+2 physical assistance General transfer comment: use of gait belt and bed pad; assist for sitting balance throughout transfer as pt has posterior bias; cues for hand  placement and sequencing; pt reaching out to chair and for armrest of chair with L UE  Ambulation/Gait                 Stairs             Wheelchair Mobility    Modified Rankin (Stroke Patients Only)       Balance Overall balance assessment: Needs assistance   Sitting balance-Leahy Scale: Poor Sitting balance - Comments: pt reliant on bilat UE and external assistance to maintain sitting balance EOB due to posterior bias                                    Cognition Arousal/Alertness: Awake/alert Behavior During Therapy: Flat affect Overall Cognitive Status: No family/caregiver present to determine baseline cognitive functioning Area of Impairment: Attention;Following commands;Awareness                 Orientation Level: Disoriented to;Place;Time;Situation(pt not responding to these questions ) Current Attention Level: Focused   Following Commands: Follows one step commands inconsistently(responses seem more automatic then task is initiated )   Awareness: Intellectual   General Comments: pt replied "fine" when asked how he feels today beginning of session but did not speak again throughout session      Exercises Other Exercises Other Exercises: PROM bilat LE and UE     General Comments        Pertinent Vitals/Pain Pain Assessment: Faces Pain Descriptors / Indicators: Grimacing Pain Intervention(s): Repositioned(pt grimacing at times but unable to specify location of pain)  Home Living                      Prior Function            PT Goals (current goals can now be found in the care plan section) Acute Rehab PT Goals PT Goal Formulation: Patient unable to participate in goal setting Time For Goal Achievement: 05/08/18 Potential to Achieve Goals: Fair Progress towards PT goals: Progressing toward goals    Frequency    Min 2X/week      PT Plan Current plan remains appropriate    Co-evaluation               AM-PAC PT "6 Clicks" Daily Activity  Outcome Measure  Difficulty turning over in bed (including adjusting bedclothes, sheets and blankets)?: Unable Difficulty moving from lying on back to sitting on the side of the bed? : Unable Difficulty sitting down on and standing up from a chair with arms (e.g., wheelchair, bedside commode, etc,.)?: Unable Help needed moving to and from a bed to chair (including a wheelchair)?: A Lot Help needed walking in hospital room?: Total Help needed climbing 3-5 steps with a railing? : Total 6 Click Score: 7    End of Session Equipment Utilized During Treatment: Gait belt Activity Tolerance: Patient tolerated treatment well Patient left: with call bell/phone within reach;in chair;with chair alarm set Nurse Communication: Mobility status;Need for lift equipment PT Visit Diagnosis: Other abnormalities of gait and mobility (R26.89);Other symptoms and signs involving the nervous system (R29.898)     Time: 3536-1443 PT Time Calculation (min) (ACUTE ONLY): 33 min  Charges:  $Therapeutic Activity: 23-37 mins                    G Codes:       Earney Navy, PTA Pager: [phone]     Darliss Cheney 04/29/2018, 12:07 PM

## 2018-04-29 NOTE — Care Management Note (Signed)
Case Management Note  Patient Details  Name: Henry Smith MRN: [id number] Date of Birth: 11/18/1941  Subjective/Objective:                    Action/Plan: Plan continues for patient to return to [facility] in [place] when medically ready. CM following.  Expected Discharge Date:                  Expected Discharge Plan:  Skilled Nursing Facility  In-House Referral:  Clinical Social Work  Discharge planning Services     Post Acute Care Choice:    Choice offered to:     DME Arranged:    DME Agency:     HH Arranged:    [place] Agency:     Status of Service:  In process, will continue to follow  If discussed at Long Length of Stay Meetings, dates discussed:    Additional Comments:  Pollie Friar, RN 04/29/2018, 2:46 PM

## 2018-04-29 NOTE — Progress Notes (Signed)
Report received at bedside on patient, who was awake. RN said that patient should have his meds crushed in vanilla ice cream, and to be careful, that he pockets food. Safety maintained. Will continue to monitor.

## 2018-04-29 NOTE — Progress Notes (Signed)
Paient sleeping, appears comfortable. Will continue to monitor.

## 2018-04-29 NOTE — Progress Notes (Signed)
PROGRESS NOTE    Henry Smith  TDD:[id number] DOB: 06-05-1941 DOA: 04/23/2018 PCP: Caprice Renshaw, MD   Brief Narrative: Henry Smith is a 77 y.o. male with a history of schizophrenia, dementia associate with neurosyphilis, prostate cancer, hypertension.  Patient presented secondary to worsening mental status, including becoming nonverbal with facial droop.  Initial concern for stroke, however, MRI was negative.  He was found to have a UTI with associated bacteremia and is currently on antibiotic therapy and the coronary.   Assessment & Plan:   Principal Problem:   Altered mental status, unspecified Active Problems:   Schizoaffective disorder, bipolar type (HCC)   Sepsis secondary to UTI [facility])   UTI (urinary tract infection)   Acute metabolic encephalopathy   Proteus mirabilis infection   Bacteremia   Sepsis due to Gram negative bacteria (HCC)   Sepsis Bacteremia/UTI secondary to Proteus Mirabilis Sensitive to ceftriaxone. -Continue ceftriaxone IV secondary to n.p.o. status  Acute metabolic encephalopathy In setting of acute infection in setting of underlying dementia secondary to neurosyphilis.  Patient is far from baseline.  MRI was negative for acute finding that could explain his mental status change. EEG without evidence of seizure activity. Improved from yesterday. -Continue treatment as mentioned above for infection  Schizoaffective disorder Psychiatry consulted for evaluation. -Continue Depakote  Dysphasia Unknown etiology.  Possibly related to underlying dementia and schizophrenia.  Did not tolerate Cortrak placement yesterday. -Continue IV fluids -SLP recommendations  Acute kidney injury Resolved.  Essential hypertension Uncontrolled.  Not on oral medication secondary to n.p.o. status -Continue hydralazine 10 mg every 6 hours IV  Thrombocytopenia Chronic. Stable.  Hypernatremia Worsened. Secondary to poor oral intake. -Encourage oral  intake -Repeat BMP -D5 1/2 NS IV fluids   DVT prophylaxis: Lovenox Code Status:   Code Status: Full Code Family Communication: None at bedside. Disposition Plan: Discharge to SNF when medically stable   Consultants:   Neurology  Procedures:   None  Antimicrobials:  Levaquin (5/11)  Aztreonam (5/11>>5/12)  Ceftriaxone (5/12>>   Subjective: No concerns overnight. No pain.  Objective: Vitals:   04/28/18 2014 04/29/18 0000 04/29/18 0404 04/29/18 0816  BP: (!) 143/75 (!) 156/87 (!) 145/89 (!) 149/80  Pulse: (!) 105 98 91 96  Resp: 16 16 16 17   Temp: 98.6 F (37 C) 98.4 F (36.9 C) 97.8 F (36.6 C) 98.4 F (36.9 C)  TempSrc: Axillary Oral Oral Oral  SpO2: 99% 97% 96% 97%  Weight:      Height:        Intake/Output Summary (Last 24 hours) at 04/29/2018 0846 Last data filed at 04/29/2018 0404 Gross per 24 hour  Intake -  Output 1325 ml  Net -1325 ml   Filed Weights   04/23/18 1429 04/23/18 2025 04/24/18 0548  Weight: 90.7 kg (200 lb) 98.2 kg (216 lb 7.9 oz) 98.6 kg (217 lb 6 oz)    Examination:  General exam: Appears calm and comfortable Respiratory system: Clear to auscultation. Respiratory effort normal. Cardiovascular system: S1 & S2 heard, RRR. No murmurs. Gastrointestinal system: Abdomen is nondistended, soft and nontender. Normal bowel sounds heard. Central nervous system: Alert, answers questions seemingly appropriately with head nods/shakes and verbalized by saying "no" Extremities: No edema. No calf tenderness Skin: No cyanosis. No rashes Psychiatry: Judgement and insight appear impaired. Flat affect    Data Reviewed: I have personally reviewed following labs and imaging studies  CBC: Recent Labs  Lab 04/23/18 1554 04/24/18 0436 04/27/18 0808 04/28/18 0335  WBC 15.6* 11.4* 8.2 9.0  NEUTROABS 14.0  --   --   --   HGB 15.9 14.0 14.5 14.5  HCT 48.1 44.7 44.8 44.5  MCV 85.4 88.2 85.3 84.6  PLT 82* 77* 83* 95*   Basic Metabolic  Panel: Recent Labs  Lab 04/24/18 0436 04/25/18 0440 04/27/18 0808 04/28/18 0335 04/29/18 0342  NA 139 144 147* 146* 149*  K 4.8 4.1 3.8 3.6 3.5  CL 107 113* 117* 111 117*  CO2 26 25 26 28 27   GLUCOSE 92 104* 100* 153* 97  BUN 26* 25* 17 17 18   CREATININE 1.34* 1.06 0.85 1.00 0.82  CALCIUM 10.9* 11.4* 12.4* 12.5* 13.0*   GFR: Estimated Creatinine Clearance: 94.6 mL/min (by C-G formula based on SCr of 0.82 mg/dL). Liver Function Tests: Recent Labs  Lab 04/23/18 1554 04/24/18 0436  AST 36 29  ALT 36 31  ALKPHOS 45 40  BILITOT 0.8 0.8  PROT 6.8 5.9*  ALBUMIN 3.2* 2.6*   No results for input(s): LIPASE, AMYLASE in the last 168 hours. No results for input(s): AMMONIA in the last 168 hours. Coagulation Profile: Recent Labs  Lab 04/23/18 1554  INR 1.23   Cardiac Enzymes: Recent Labs  Lab 04/23/18 1554  CKTOTAL 53  TROPONINI <0.03   BNP (last 3 results) No results for input(s): PROBNP in the last 8760 hours. HbA1C: No results for input(s): HGBA1C in the last 72 hours. CBG: Recent Labs  Lab 04/28/18 1151 04/28/18 1556 04/28/18 2008 04/29/18 0005 04/29/18 0409  GLUCAP 116* 99 131* 83 97   Lipid Profile: No results for input(s): CHOL, HDL, LDLCALC, TRIG, CHOLHDL, LDLDIRECT in the last 72 hours. Thyroid Function Tests: No results for input(s): TSH, T4TOTAL, FREET4, T3FREE, THYROIDAB in the last 72 hours. Anemia Panel: No results for input(s): VITAMINB12, FOLATE, FERRITIN, TIBC, IRON, RETICCTPCT in the last 72 hours. Sepsis Labs: Recent Labs  Lab 04/23/18 1554 04/23/18 1740 04/24/18 0127 04/24/18 0436  LATICACIDVEN 5.2* 4.3* 2.8* 3.3*    Recent Results (from the past 240 hour(s))  Urine culture     Status: Abnormal   Collection Time: 04/23/18  3:40 PM  Result Value Ref Range Status   Specimen Description   Final    URINE, CATHETERIZED Performed at [facility], [address], [place], [place] [address]    Special Requests   Final     NONE Performed at [facility] [address] [place], [place] [address]    Culture >=100,000 COLONIES/mL PROTEUS MIRABILIS (A)  Final   Report Status 04/25/2018 FINAL  Final   Organism ID, Bacteria PROTEUS MIRABILIS (A)  Final      Susceptibility   Proteus mirabilis - MIC*    AMPICILLIN <=2 SENSITIVE Sensitive     CEFAZOLIN <=4 SENSITIVE Sensitive     CEFTRIAXONE <=1 SENSITIVE Sensitive     CIPROFLOXACIN <=0.25 SENSITIVE Sensitive     GENTAMICIN <=1 SENSITIVE Sensitive     IMIPENEM 1 SENSITIVE Sensitive     NITROFURANTOIN 128 RESISTANT Resistant     TRIMETH/SULFA <=20 SENSITIVE Sensitive     AMPICILLIN/SULBACTAM <=2 SENSITIVE Sensitive     PIP/TAZO <=4 SENSITIVE Sensitive     * >=100,000 COLONIES/mL PROTEUS MIRABILIS  Culture, blood (routine x 2)     Status: Abnormal   Collection Time: 04/23/18  5:41 PM  Result Value Ref Range Status   Specimen Description   Final    RIGHT ANTECUBITAL Performed at [facility], [address], [place],  [address]    Special Requests  Final    BOTTLES DRAWN AEROBIC AND ANAEROBIC Blood Culture adequate volume Performed at [facility], [address], [place], [place] [address]    Culture  Setup Time   Final    GRAM NEGATIVE RODS IN BOTH AEROBIC AND ANAEROBIC BOTTLES Gram Stain Report Called to,Read Back By and Verified With: NESBITT,R@0815  BY MATTHEWS, B 5.12.19 Performed at [facility]    Culture (A)  Final    PROTEUS MIRABILIS SUSCEPTIBILITIES PERFORMED ON PREVIOUS CULTURE WITHIN THE LAST 5 DAYS. Performed at [facility], [place] [address] [place], [place] [address]    Report Status 04/26/2018 FINAL  Final  Culture, blood (routine x 2)     Status: Abnormal   Collection Time: 04/23/18  5:42 PM  Result Value Ref Range Status   Specimen Description   Final    BLOOD LEFT ARM Performed at [facility], [address] [place], [place] [address]    Special Requests   Final    BOTTLES DRAWN AEROBIC ONLY Blood Culture  results may not be optimal due to an inadequate volume of blood received in culture bottles Performed at [place] [place], [place] [address]    Culture  Setup Time   Final    GRAM NEGATIVE RODS AEROBIC BOTTLE ONLY Gram Stain Report Called to,Read Back By and Verified With: NESBITT,R@0917  BY MATTHEWS, B 5.12.19 Performed at [facility] (A)  Final   Report Status 04/26/2018 FINAL  Final   Organism ID, Bacteria PROTEUS MIRABILIS  Final      Susceptibility   Proteus mirabilis - MIC*    AMPICILLIN <=2 SENSITIVE Sensitive     CEFAZOLIN <=4 SENSITIVE Sensitive     CEFEPIME <=1 SENSITIVE Sensitive     CEFTAZIDIME <=1 SENSITIVE Sensitive     CEFTRIAXONE <=1 SENSITIVE Sensitive     CIPROFLOXACIN <=0.25 SENSITIVE Sensitive     GENTAMICIN <=1 SENSITIVE Sensitive     IMIPENEM 1 SENSITIVE Sensitive     TRIMETH/SULFA <=20 SENSITIVE Sensitive     AMPICILLIN/SULBACTAM <=2 SENSITIVE Sensitive     PIP/TAZO <=4 SENSITIVE Sensitive     * PROTEUS MIRABILIS  Blood Culture ID Panel (Reflexed)     Status: Abnormal   Collection Time: 04/23/18  5:42 PM  Result Value Ref Range Status   Enterococcus species NOT DETECTED NOT DETECTED Final   Listeria monocytogenes NOT DETECTED NOT DETECTED Final   Staphylococcus species NOT DETECTED NOT DETECTED Final   Staphylococcus aureus NOT DETECTED NOT DETECTED Final   Streptococcus species NOT DETECTED NOT DETECTED Final   Streptococcus agalactiae NOT DETECTED NOT DETECTED Final   Streptococcus pneumoniae NOT DETECTED NOT DETECTED Final   Streptococcus pyogenes NOT DETECTED NOT DETECTED Final   Acinetobacter baumannii NOT DETECTED NOT DETECTED Final   Enterobacteriaceae species DETECTED (A) NOT DETECTED Final    Comment: Enterobacteriaceae represent a large family of gram-negative bacteria, not a single organism. CRITICAL RESULT CALLED TO, READ BACK BY AND VERIFIED WITH: NITA JOHNSTON PHRAMD AT [facility] AT 6144 ON  [id number] BY SJW    Enterobacter cloacae complex NOT DETECTED NOT DETECTED Final   Escherichia coli NOT DETECTED NOT DETECTED Final   Klebsiella oxytoca NOT DETECTED NOT DETECTED Final   Klebsiella pneumoniae NOT DETECTED NOT DETECTED Final   Proteus species DETECTED (A) NOT DETECTED Final    Comment: CRITICAL RESULT CALLED TO, READ BACK BY AND VERIFIED WITH: NICK HAYES PHARMD AT 1245 ON [id number] BY SJW  Serratia marcescens NOT DETECTED NOT DETECTED Final   Carbapenem resistance NOT DETECTED NOT DETECTED Final   Haemophilus influenzae NOT DETECTED NOT DETECTED Final   Neisseria meningitidis NOT DETECTED NOT DETECTED Final   Pseudomonas aeruginosa NOT DETECTED NOT DETECTED Final   Candida albicans NOT DETECTED NOT DETECTED Final   Candida glabrata NOT DETECTED NOT DETECTED Final   Candida krusei NOT DETECTED NOT DETECTED Final   Candida parapsilosis NOT DETECTED NOT DETECTED Final   Candida tropicalis NOT DETECTED NOT DETECTED Final    Comment: Performed at [facility], [place] [address] [place], [place] [address]  MRSA PCR Screening     Status: None   Collection Time: 04/23/18  8:06 PM  Result Value Ref Range Status   MRSA by PCR NEGATIVE NEGATIVE Final    Comment:        The GeneXpert MRSA Assay (FDA approved for NASAL specimens only), is one component of a comprehensive MRSA colonization surveillance program. It is not intended to diagnose MRSA infection nor to guide or monitor treatment for MRSA infections. Performed at [facility], [address] [place], [place] [address]   Culture, blood (routine x 2)     Status: None (Preliminary result)   Collection Time: 04/25/18  7:46 AM  Result Value Ref Range Status   Specimen Description BLOOD RIGHT HAND  Final   Special Requests   Final    BOTTLES DRAWN AEROBIC AND ANAEROBIC Blood Culture adequate volume   Culture   Final    NO GROWTH 3 DAYS Performed at [facility] [address] [place], [place] [address]     Report Status PENDING  Incomplete  Culture, blood (routine x 2)     Status: None (Preliminary result)   Collection Time: 04/25/18  8:00 AM  Result Value Ref Range Status   Specimen Description BLOOD RIGHT ARM  Final   Special Requests   Final    BOTTLES DRAWN AEROBIC AND ANAEROBIC Blood Culture adequate volume   Culture   Final    NO GROWTH 3 DAYS Performed at [facility], [address], [place], [place] [address]    Report Status PENDING  Incomplete         Radiology Studies: No results found.      Scheduled Meds: . amantadine  100 mg Oral BID  . feeding supplement (ENSURE ENLIVE)  237 mL Oral BID BM  . gabapentin  300 mg Oral TID  . hydrALAZINE  10 mg Intravenous Q6H  . latanoprost  1 drop Both Eyes QHS  . multivitamin with minerals  1 tablet Oral Daily   Continuous Infusions: . cefTRIAXone (ROCEPHIN)  IV Stopped (04/28/18 1755)  . dextrose 5 % and 0.45% NaCl 75 mL/hr at 04/29/18 0752  . valproate sodium Stopped (04/28/18 2343)   And  . valproate sodium Stopped (04/29/18 0700)     LOS: 6 days     Cordelia Poche, MD [facility] 04/29/2018, 8:46 AM Pager: [phone]  If 7PM-7AM, please contact night-coverage [email or web address] 04/29/2018, 8:46 AM

## 2018-04-29 NOTE — Progress Notes (Signed)
  Speech Language Pathology Treatment: Dysphagia  Patient Details Name: Generoso Cropper MRN: [id number] DOB: 1941/11/08 Today's Date: 04/29/2018 Time: 9892-1194 SLP Time Calculation (min) (ACUTE ONLY): 15 min  Assessment / Plan / Recommendation Clinical Impression  Pt demonstrates stable ability to consume purees and thin liquids with SLP and staff. Response to PO appears to greadually improving this am. NT was able to give pt breakfast and pt accepted straw sips without oral holding. Discussed specific methods of feeding with NT and RN and posted sign. Pt seems to prefer am meal given frequent calls from RN today and the past three days that despite consuming PO well in am, he orally holds PO and meds closer to lunch time. Again provided specific suggestions to facilitate intake (given meds before meals when pt hungry, crushing and putting in applesauce, suing hand over hand assist and appropriate posture). NT felt pt was tiring of same foods, so will advance diet to puree and thin given improvements.   HPI HPI: Pt is a 77 y.o. male admitted 04/23/18 with AMS, including non-verbal, not walking and R-side facial droop. Worked up for encephalopathy secondary to sepsis due to UTI, AKI. Head CT and MRI show no acute intracranial finding; mild small vessel change normal for age. PMH includes dementia, schizophrenia, prostate CA      SLP Plan  Continue with current plan of care       Recommendations  Diet recommendations: Thin liquid;Dysphagia 1 (puree) Liquids provided via: Cup;Straw Medication Administration: Crushed with puree Supervision: Full supervision/cueing for compensatory strategies Compensations: Follow solids with liquid;Minimize environmental distractions Postural Changes and/or Swallow Maneuvers: Seated upright 90 degrees                SLP Visit Diagnosis: Cognitive communication deficit (R41.841);Dysphagia, unspecified (R13.10) Plan: Continue with current plan of  care       GO                Yemariam Magar, Katherene Ponto 04/29/2018, 2:24 PM

## 2018-04-30 LAB — CBC
HCT: 41.1 % (ref 39.0–52.0)
Hemoglobin: 13.1 g/dL (ref 13.0–17.0)
MCH: 27.3 pg (ref 26.0–34.0)
MCHC: 31.9 g/dL (ref 30.0–36.0)
MCV: 85.8 fL (ref 78.0–100.0)
PLATELETS: 133 10*3/uL — AB (ref 150–400)
RBC: 4.79 MIL/uL (ref 4.22–5.81)
RDW: 15.8 % — ABNORMAL HIGH (ref 11.5–15.5)
WBC: 7.3 10*3/uL (ref 4.0–10.5)

## 2018-04-30 LAB — CULTURE, BLOOD (ROUTINE X 2)
CULTURE: NO GROWTH
Culture: NO GROWTH
Special Requests: ADEQUATE
Special Requests: ADEQUATE

## 2018-04-30 LAB — GLUCOSE, CAPILLARY
GLUCOSE-CAPILLARY: 79 mg/dL (ref 65–99)
GLUCOSE-CAPILLARY: 80 mg/dL (ref 65–99)
GLUCOSE-CAPILLARY: 90 mg/dL (ref 65–99)
Glucose-Capillary: 83 mg/dL (ref 65–99)
Glucose-Capillary: 81 mg/dL (ref 65–99)
Glucose-Capillary: 116 mg/dL — ABNORMAL HIGH (ref 65–99)

## 2018-04-30 MED ORDER — LOSARTAN POTASSIUM 50 MG PO TABS
25.0000 mg | ORAL_TABLET | Freq: Every day | ORAL | Status: DC
  Administered 2018-04-30 – 2018-05-06 (×7): 25 mg via ORAL
  Filled 2018-04-30 (×7): qty 1

## 2018-04-30 NOTE — Progress Notes (Signed)
Patient's speech is becoming stronger. He moved his L arm over his chest, and was able to let me know he was cold. Will continue to monitor.

## 2018-04-30 NOTE — Progress Notes (Signed)
PROGRESS NOTE    Henry Smith  TFT:[id number] DOB: 06/21/1941 DOA: 04/23/2018 PCP: Caprice Renshaw, MD   Brief Narrative: Henry Smith is a 77 y.o. male with a history of schizophrenia, dementia associate with neurosyphilis, prostate cancer, hypertension.  Patient presented secondary to worsening mental status, including becoming nonverbal with facial droop.  Initial concern for stroke, however, MRI was negative.  He was found to have a UTI with associated bacteremia and is currently on antibiotic therapy.   Assessment & Plan:   Principal Problem:   Altered mental status, unspecified Active Problems:   Schizoaffective disorder, bipolar type (HCC)   Sepsis secondary to UTI [facility])   UTI (urinary tract infection)   Acute metabolic encephalopathy   Proteus mirabilis infection   Bacteremia   Sepsis due to Gram negative bacteria ([place])   Sepsis Bacteremia/UTI secondary to Proteus Mirabilis Sensitive to ceftriaxone. -Continue ceftriaxone. Will treat for 10 days  Acute metabolic encephalopathy In setting of acute infection in setting of underlying dementia secondary to neurosyphilis.  Patient is far from baseline.  MRI was negative for acute finding that could explain his mental status change. EEG without evidence of seizure activity. Improved from yesterday. -Continue treatment as mentioned above for infection.  Schizoaffective disorder Psychiatry consulted for evaluation. -Continue Depakote  Dysphasia Unknown etiology.  Possibly related to underlying dementia and schizophrenia vs metabolic encephalopathy from infection. Improving. -Continue IV fluids -SLP recommendations -Continue to encourage oral itnake  Acute kidney injury Resolved.  Essential hypertension Uncontrolled.  -Continue hydralazine 10 mg every 6 hours IV -Restart home losartan  Thrombocytopenia Chronic. Improved slightly, but otherwise stable.  Hypernatremia Worsened. Secondary to poor oral intake.  No BMP available today -Encourage oral intake -Repeat BMP today pending -D5 1/2 NS IV fluids   DVT prophylaxis: Lovenox Code Status:   Code Status: Full Code Family Communication: None at bedside. Disposition Plan: Discharge to SNF when medically stable   Consultants:   Neurology  Procedures:   EEG (5/16) IMPRESSION:  This is an abnormal EEG demonstrating a mild to moderate diffuse slowing of electrocerebral activity.  This can be seen in a wide variety of encephalopathic state including those of a toxic, metabolic, or degenerative nature.  There were no focal, hemispheric, or lateralizing features.  No epileptiform activity was recorded.   Antimicrobials:  Levaquin (5/11)  Aztreonam (5/11>>5/12)  Ceftriaxone (5/12>>   Subjective: Patient states he ate some of his food today.  Objective: Vitals:   04/29/18 2000 04/30/18 0022 04/30/18 0312 04/30/18 0845  BP: 131/71 (!) 157/85 (!) 155/87 (!) 160/79  Pulse: 98 82 83 88  Resp: 18 18 18 17   Temp: 98.3 F (36.8 C) 98.2 F (36.8 C) 97.9 F (36.6 C) 98.2 F (36.8 C)  TempSrc: Oral Oral Oral Axillary  SpO2: 98% 98% 95% 97%  Weight:      Height:        Intake/Output Summary (Last 24 hours) at 04/30/2018 1125 Last data filed at 04/30/2018 1100 Gross per 24 hour  Intake 2255 ml  Output 2480 ml  Net -225 ml   Filed Weights   04/23/18 1429 04/23/18 2025 04/24/18 0548  Weight: 90.7 kg (200 lb) 98.2 kg (216 lb 7.9 oz) 98.6 kg (217 lb 6 oz)    Examination:  General exam: Appears calm and comfortable  Respiratory system: Clear to auscultation. Respiratory effort normal. Cardiovascular system: S1 & S2 heard, RRR. No murmurs, rubs, gallops or clicks. Gastrointestinal system: Abdomen is nondistended, soft and nontender. No organomegaly or masses  felt. Normal bowel sounds heard. Central nervous system: Alert. Answering questions more appropriately today. Is pocketing some of his food. Extremities: No edema. No calf  tenderness Skin: No cyanosis. No rashes Psychiatry: Judgement and insight appear impaired. Flat affect    Data Reviewed: I have personally reviewed following labs and imaging studies  CBC: Recent Labs  Lab 04/23/18 1554 04/24/18 0436 04/27/18 0808 04/28/18 0335 04/30/18 0410  WBC 15.6* 11.4* 8.2 9.0 7.3  NEUTROABS 14.0  --   --   --   --   HGB 15.9 14.0 14.5 14.5 13.1  HCT 48.1 44.7 44.8 44.5 41.1  MCV 85.4 88.2 85.3 84.6 85.8  PLT 82* 77* 83* 95* 387*   Basic Metabolic Panel: Recent Labs  Lab 04/24/18 0436 04/25/18 0440 04/27/18 0808 04/28/18 0335 04/29/18 0342  NA 139 144 147* 146* 149*  K 4.8 4.1 3.8 3.6 3.5  CL 107 113* 117* 111 117*  CO2 26 25 26 28 27   GLUCOSE 92 104* 100* 153* 97  BUN 26* 25* 17 17 18   CREATININE 1.34* 1.06 0.85 1.00 0.82  CALCIUM 10.9* 11.4* 12.4* 12.5* 13.0*   GFR: Estimated Creatinine Clearance: 94.6 mL/min (by C-G formula based on SCr of 0.82 mg/dL). Liver Function Tests: Recent Labs  Lab 04/23/18 1554 04/24/18 0436  AST 36 29  ALT 36 31  ALKPHOS 45 40  BILITOT 0.8 0.8  PROT 6.8 5.9*  ALBUMIN 3.2* 2.6*   No results for input(s): LIPASE, AMYLASE in the last 168 hours. No results for input(s): AMMONIA in the last 168 hours. Coagulation Profile: Recent Labs  Lab 04/23/18 1554  INR 1.23   Cardiac Enzymes: Recent Labs  Lab 04/23/18 1554  CKTOTAL 17  TROPONINI <0.03   BNP (last 3 results) No results for input(s): PROBNP in the last 8760 hours. HbA1C: No results for input(s): HGBA1C in the last 72 hours. CBG: Recent Labs  Lab 04/29/18 1201 04/29/18 2013 04/30/18 0020 04/30/18 0414 04/30/18 0809  GLUCAP 114* 99 90 80 83   Lipid Profile: No results for input(s): CHOL, HDL, LDLCALC, TRIG, CHOLHDL, LDLDIRECT in the last 72 hours. Thyroid Function Tests: No results for input(s): TSH, T4TOTAL, FREET4, T3FREE, THYROIDAB in the last 72 hours. Anemia Panel: No results for input(s): VITAMINB12, FOLATE, FERRITIN, TIBC,  IRON, RETICCTPCT in the last 72 hours. Sepsis Labs: Recent Labs  Lab 04/23/18 1554 04/23/18 1740 04/24/18 0127 04/24/18 0436  LATICACIDVEN 5.2* 4.3* 2.8* 3.3*    Recent Results (from the past 240 hour(s))  Urine culture     Status: Abnormal   Collection Time: 04/23/18  3:40 PM  Result Value Ref Range Status   Specimen Description   Final    URINE, CATHETERIZED Performed at [facility], [address] [place], [place] [address]    Special Requests   Final    NONE Performed at [facility], [address], [place], [place] [address]    Culture >=100,000 COLONIES/mL PROTEUS MIRABILIS (A)  Final   Report Status 04/25/2018 FINAL  Final   Organism ID, Bacteria PROTEUS MIRABILIS (A)  Final      Susceptibility   Proteus mirabilis - MIC*    AMPICILLIN <=2 SENSITIVE Sensitive     CEFAZOLIN <=4 SENSITIVE Sensitive     CEFTRIAXONE <=1 SENSITIVE Sensitive     CIPROFLOXACIN <=0.25 SENSITIVE Sensitive     GENTAMICIN <=1 SENSITIVE Sensitive     IMIPENEM 1 SENSITIVE Sensitive     NITROFURANTOIN 128 RESISTANT Resistant     TRIMETH/SULFA <=  20 SENSITIVE Sensitive     AMPICILLIN/SULBACTAM <=2 SENSITIVE Sensitive     PIP/TAZO <=4 SENSITIVE Sensitive     * >=100,000 COLONIES/mL PROTEUS MIRABILIS  Culture, blood (routine x 2)     Status: Abnormal   Collection Time: 04/23/18  5:41 PM  Result Value Ref Range Status   Specimen Description   Final    RIGHT ANTECUBITAL Performed at [facility], [address] [place], [place] [address]    Special Requests   Final    BOTTLES DRAWN AEROBIC AND ANAEROBIC Blood Culture adequate volume Performed at [facility], [address] [place], [place] [address]    Culture  Setup Time   Final    GRAM NEGATIVE RODS IN BOTH AEROBIC AND ANAEROBIC BOTTLES Gram Stain Report Called to,Read Back By and Verified With: NESBITT,R@0815  BY MATTHEWS, B 5.12.19 Performed at [facility]    Culture (A)  Final    PROTEUS MIRABILIS SUSCEPTIBILITIES PERFORMED  ON PREVIOUS CULTURE WITHIN THE LAST 5 DAYS. Performed at [facility], [place] [address] [place], [place] [address]    Report Status 04/26/2018 FINAL  Final  Culture, blood (routine x 2)     Status: Abnormal   Collection Time: 04/23/18  5:42 PM  Result Value Ref Range Status   Specimen Description   Final    BLOOD LEFT ARM Performed at [facility], [address], [place], [place] [address]    Special Requests   Final    BOTTLES DRAWN AEROBIC ONLY Blood Culture results may not be optimal due to an inadequate volume of blood received in culture bottles Performed at [place] [place], [place] [address]    Culture  Setup Time   Final    GRAM NEGATIVE RODS AEROBIC BOTTLE ONLY Gram Stain Report Called to,Read Back By and Verified With: NESBITT,R@0917  BY MATTHEWS, B 5.12.19 Performed at [place] (A)  Final   Report Status 04/26/2018 FINAL  Final   Organism ID, Bacteria PROTEUS MIRABILIS  Final      Susceptibility   Proteus mirabilis - MIC*    AMPICILLIN <=2 SENSITIVE Sensitive     CEFAZOLIN <=4 SENSITIVE Sensitive     CEFEPIME <=1 SENSITIVE Sensitive     CEFTAZIDIME <=1 SENSITIVE Sensitive     CEFTRIAXONE <=1 SENSITIVE Sensitive     CIPROFLOXACIN <=0.25 SENSITIVE Sensitive     GENTAMICIN <=1 SENSITIVE Sensitive     IMIPENEM 1 SENSITIVE Sensitive     TRIMETH/SULFA <=20 SENSITIVE Sensitive     AMPICILLIN/SULBACTAM <=2 SENSITIVE Sensitive     PIP/TAZO <=4 SENSITIVE Sensitive     * PROTEUS MIRABILIS  Blood Culture ID Panel (Reflexed)     Status: Abnormal   Collection Time: 04/23/18  5:42 PM  Result Value Ref Range Status   Enterococcus species NOT DETECTED NOT DETECTED Final   Listeria monocytogenes NOT DETECTED NOT DETECTED Final   Staphylococcus species NOT DETECTED NOT DETECTED Final   Staphylococcus aureus NOT DETECTED NOT DETECTED Final   Streptococcus species NOT DETECTED NOT DETECTED Final   Streptococcus  agalactiae NOT DETECTED NOT DETECTED Final   Streptococcus pneumoniae NOT DETECTED NOT DETECTED Final   Streptococcus pyogenes NOT DETECTED NOT DETECTED Final   Acinetobacter baumannii NOT DETECTED NOT DETECTED Final   Enterobacteriaceae species DETECTED (A) NOT DETECTED Final    Comment: Enterobacteriaceae represent a large family of gram-negative bacteria, not a single organism. CRITICAL RESULT CALLED TO, READ BACK BY  AND VERIFIED WITH: NITA JOHNSTON PHRAMD AT [place] 0093 ON [id number] BY SJW    Enterobacter cloacae complex NOT DETECTED NOT DETECTED Final   Escherichia coli NOT DETECTED NOT DETECTED Final   Klebsiella oxytoca NOT DETECTED NOT DETECTED Final   Klebsiella pneumoniae NOT DETECTED NOT DETECTED Final   Proteus species DETECTED (A) NOT DETECTED Final    Comment: CRITICAL RESULT CALLED TO, READ BACK BY AND VERIFIED WITH: NICK HAYES PHARMD AT 1245 ON [id number] BY SJW    Serratia marcescens NOT DETECTED NOT DETECTED Final   Carbapenem resistance NOT DETECTED NOT DETECTED Final   Haemophilus influenzae NOT DETECTED NOT DETECTED Final   Neisseria meningitidis NOT DETECTED NOT DETECTED Final   Pseudomonas aeruginosa NOT DETECTED NOT DETECTED Final   Candida albicans NOT DETECTED NOT DETECTED Final   Candida glabrata NOT DETECTED NOT DETECTED Final   Candida krusei NOT DETECTED NOT DETECTED Final   Candida parapsilosis NOT DETECTED NOT DETECTED Final   Candida tropicalis NOT DETECTED NOT DETECTED Final    Comment: Performed at [facility], [address], [place], [place] [address]  MRSA PCR Screening     Status: None   Collection Time: 04/23/18  8:06 PM  Result Value Ref Range Status   MRSA by PCR NEGATIVE NEGATIVE Final    Comment:        The GeneXpert MRSA Assay (FDA approved for NASAL specimens only), is one component of a comprehensive MRSA colonization surveillance program. It is not intended to diagnose MRSA infection nor to guide or monitor treatment for MRSA  infections. Performed at [facility], [address] [place], [place] [address]   Culture, blood (routine x 2)     Status: None (Preliminary result)   Collection Time: 04/25/18  7:46 AM  Result Value Ref Range Status   Specimen Description BLOOD RIGHT HAND  Final   Special Requests   Final    BOTTLES DRAWN AEROBIC AND ANAEROBIC Blood Culture adequate volume   Culture   Final    NO GROWTH 4 DAYS Performed at [place] [address], [place], [place] [address]    Report Status PENDING  Incomplete  Culture, blood (routine x 2)     Status: None (Preliminary result)   Collection Time: 04/25/18  8:00 AM  Result Value Ref Range Status   Specimen Description BLOOD RIGHT ARM  Final   Special Requests   Final    BOTTLES DRAWN AEROBIC AND ANAEROBIC Blood Culture adequate volume   Culture   Final    NO GROWTH 4 DAYS Performed at [facility], [address], [place], [place] [address]    Report Status PENDING  Incomplete         Radiology Studies: No results found.      Scheduled Meds: . amantadine  100 mg Oral BID  . feeding supplement (ENSURE ENLIVE)  237 mL Oral BID BM  . gabapentin  300 mg Oral TID  . hydrALAZINE  10 mg Intravenous Q6H  . latanoprost  1 drop Both Eyes QHS  . multivitamin with minerals  1 tablet Oral Daily   Continuous Infusions: . cefTRIAXone (ROCEPHIN)  IV 2 g (04/29/18 1809)  . dextrose 5 % and 0.45% NaCl 75 mL/hr at 04/30/18 1016  . valproate sodium Stopped (04/29/18 2347)   And  . valproate sodium Stopped (04/30/18 0708)     LOS: 7 days     Cordelia Poche, MD [facility] 04/30/2018, 11:25  AM Pager: [phone]  If 7PM-7AM, please contact night-coverage [email or web address] 04/30/2018, 11:25 AM

## 2018-05-01 LAB — GLUCOSE, CAPILLARY: Glucose-Capillary: 88 mg/dL (ref 65–99)

## 2018-05-01 LAB — BASIC METABOLIC PANEL
ANION GAP: 10 (ref 5–15)
BUN: 18 mg/dL (ref 6–20)
CO2: 26 mmol/L (ref 22–32)
Calcium: 13.4 mg/dL (ref 8.9–10.3)
Chloride: 118 mmol/L — ABNORMAL HIGH (ref 101–111)
Creatinine, Ser: 0.92 mg/dL (ref 0.61–1.24)
GFR calc Af Amer: >60 mL/min (ref >60)
Glucose, Bld: 94 mg/dL (ref 65–99)
POTASSIUM: 3.7 mmol/L (ref 3.5–5.1)
Sodium: 154 mmol/L — ABNORMAL HIGH (ref 135–145)

## 2018-05-01 NOTE — Progress Notes (Signed)
PROGRESS NOTE    Gibril Mastro  QAS:[id number] DOB: 08-22-1941 DOA: 04/23/2018 PCP: Caprice Renshaw, MD   Brief Narrative: Koji Niehoff is a 77 y.o. male with a history of schizophrenia, dementia associate with neurosyphilis, prostate cancer, hypertension.  Patient presented secondary to worsening mental status, including becoming nonverbal with facial droop.  Initial concern for stroke, however, MRI was negative.  He was found to have a UTI with associated bacteremia and is currently on antibiotic therapy.   Assessment & Plan:   Principal Problem:   Altered mental status, unspecified Active Problems:   Schizoaffective disorder, bipolar type (HCC)   Sepsis secondary to UTI [facility])   UTI (urinary tract infection)   Acute metabolic encephalopathy   Proteus mirabilis infection   Bacteremia   Sepsis due to Gram negative bacteria ([place])   Sepsis Bacteremia/UTI secondary to Proteus Mirabilis Sensitive to ceftriaxone. -Continue ceftriaxone. Will treat for 10 days  Acute metabolic encephalopathy In setting of acute infection in setting of underlying dementia secondary to neurosyphilis.  Patient is far from baseline.  MRI was negative for acute finding that could explain his mental status change. EEG without evidence of seizure activity. Continues to improve slowly -Continue treatment as mentioned above for infection.  Schizoaffective disorder Psychiatry consulted for evaluation. -Continue Depakote  Dysphasia Unknown etiology.  Possibly related to underlying dementia and schizophrenia vs metabolic encephalopathy from infection. Improving with improvement of encephalopathy -Continue IV fluids -SLP recommendations -Continue to encourage oral itnake  Acute kidney injury Resolved.  Essential hypertension Uncontrolled.  -Continue hydralazine 10 mg every 6 hours IV -Restart home losartan  Thrombocytopenia Chronic. Improved slightly, but otherwise  stable.  Hypernatremia Worsened. Secondary to poor oral intake. No BMP available today -Encourage oral intake -Repeat BMP today pending -D5 1/2 NS IV fluids (increased to 100 ml/hr)  Hypercalcemia -intact PTH -IV fluids   DVT prophylaxis: Lovenox Code Status:   Code Status: Full Code Family Communication: None at bedside. Disposition Plan: Discharge to SNF when medically stable   Consultants:   Neurology  Procedures:   EEG (5/16) IMPRESSION:  This is an abnormal EEG demonstrating a mild to moderate diffuse slowing of electrocerebral activity.  This can be seen in a wide variety of encephalopathic state including those of a toxic, metabolic, or degenerative nature.  There were no focal, hemispheric, or lateralizing features.  No epileptiform activity was recorded.   Antimicrobials:  Levaquin (5/11)  Aztreonam (5/11>>5/12)  Ceftriaxone (5/12>>   Subjective: Wants to leave the hospital. No pain.  Objective: Vitals:   05/01/18 0432 05/01/18 0607 05/01/18 0739 05/01/18 1136  BP: (!) 185/93 (!) 155/88 (!) 167/94 (!) 163/96  Pulse: 79 93 94 91  Resp: 20  19 15   Temp: 97.6 F (36.4 C)  97.9 F (36.6 C) 98 F (36.7 C)  TempSrc: Oral  Oral Axillary  SpO2: 97%  100% 98%  Weight:      Height:        Intake/Output Summary (Last 24 hours) at 05/01/2018 1222 Last data filed at 05/01/2018 0844 Gross per 24 hour  Intake 120 ml  Output 1890 ml  Net -1770 ml   Filed Weights   04/23/18 1429 04/23/18 2025 04/24/18 0548  Weight: 90.7 kg (200 lb) 98.2 kg (216 lb 7.9 oz) 98.6 kg (217 lb 6 oz)    Examination:  General exam: Appears calm and comfortable Respiratory system: Clear to auscultation. Respiratory effort normal. Cardiovascular system: S1 & S2 heard, RRR. No murmurs, rubs, gallops or clicks. Gastrointestinal system:  Abdomen is nondistended, soft and nontender. No organomegaly or masses felt. Normal bowel sounds heard. Central nervous system: Alert and  oriented to person. Answers questions but difficult to understand. Decreased strength, almost flaccid when examining, but then decides to move his arms on his own with 4/5 strength Extremities: No edema. No calf tenderness Skin: No cyanosis. No rashes Psychiatry: Judgement and insight appear impaired. Flat affect.     Data Reviewed: I have personally reviewed following labs and imaging studies  CBC: Recent Labs  Lab 04/27/18 0808 04/28/18 0335 04/30/18 0410  WBC 8.2 9.0 7.3  HGB 14.5 14.5 13.1  HCT 44.8 44.5 41.1  MCV 85.3 84.6 85.8  PLT 83* 95* 614*   Basic Metabolic Panel: Recent Labs  Lab 04/25/18 0440 04/27/18 0808 04/28/18 0335 04/29/18 0342 05/01/18 0537  NA 144 147* 146* 149* 154*  K 4.1 3.8 3.6 3.5 3.7  CL 113* 117* 111 117* 118*  CO2 25 26 28 27 26   GLUCOSE 104* 100* 153* 97 94  BUN 25* 17 17 18 18   CREATININE 1.06 0.85 1.00 0.82 0.92  CALCIUM 11.4* 12.4* 12.5* 13.0* 13.4*   GFR: Estimated Creatinine Clearance: 84.3 mL/min (by C-G formula based on SCr of 0.92 mg/dL). Liver Function Tests: No results for input(s): AST, ALT, ALKPHOS, BILITOT, PROT, ALBUMIN in the last 168 hours. No results for input(s): LIPASE, AMYLASE in the last 168 hours. No results for input(s): AMMONIA in the last 168 hours. Coagulation Profile: No results for input(s): INR, PROTIME in the last 168 hours. Cardiac Enzymes: No results for input(s): CKTOTAL, CKMB, CKMBINDEX, TROPONINI in the last 168 hours. BNP (last 3 results) No results for input(s): PROBNP in the last 8760 hours. HbA1C: No results for input(s): HGBA1C in the last 72 hours. CBG: Recent Labs  Lab 04/30/18 0809 04/30/18 1125 04/30/18 1630 04/30/18 1957 05/01/18 0020  GLUCAP 83 79 81 116* 88   Lipid Profile: No results for input(s): CHOL, HDL, LDLCALC, TRIG, CHOLHDL, LDLDIRECT in the last 72 hours. Thyroid Function Tests: No results for input(s): TSH, T4TOTAL, FREET4, T3FREE, THYROIDAB in the last 72  hours. Anemia Panel: No results for input(s): VITAMINB12, FOLATE, FERRITIN, TIBC, IRON, RETICCTPCT in the last 72 hours. Sepsis Labs: No results for input(s): PROCALCITON, LATICACIDVEN in the last 168 hours.  Recent Results (from the past 240 hour(s))  Urine culture     Status: Abnormal   Collection Time: 04/23/18  3:40 PM  Result Value Ref Range Status   Specimen Description   Final    URINE, CATHETERIZED Performed at [facility], [address], [place], [place] [address]    Special Requests   Final    NONE Performed at [facility], [address] [place], [place] [address]    Culture >=100,000 COLONIES/mL PROTEUS MIRABILIS (A)  Final   Report Status 04/25/2018 FINAL  Final   Organism ID, Bacteria PROTEUS MIRABILIS (A)  Final      Susceptibility   Proteus mirabilis - MIC*    AMPICILLIN <=2 SENSITIVE Sensitive     CEFAZOLIN <=4 SENSITIVE Sensitive     CEFTRIAXONE <=1 SENSITIVE Sensitive     CIPROFLOXACIN <=0.25 SENSITIVE Sensitive     GENTAMICIN <=1 SENSITIVE Sensitive     IMIPENEM 1 SENSITIVE Sensitive     NITROFURANTOIN 128 RESISTANT Resistant     TRIMETH/SULFA <=20 SENSITIVE Sensitive     AMPICILLIN/SULBACTAM <=2 SENSITIVE Sensitive     PIP/TAZO <=4 SENSITIVE Sensitive     * >=100,000 COLONIES/mL PROTEUS MIRABILIS  Culture, blood (routine x 2)     Status: Abnormal   Collection Time: 04/23/18  5:41 PM  Result Value Ref Range Status   Specimen Description   Final    RIGHT ANTECUBITAL Performed at [facility], [address], [place], [place] [address]    Special Requests   Final    BOTTLES DRAWN AEROBIC AND ANAEROBIC Blood Culture adequate volume Performed at [facility], [address] [place], [place] [address]    Culture  Setup Time   Final    GRAM NEGATIVE RODS IN BOTH AEROBIC AND ANAEROBIC BOTTLES Gram Stain Report Called to,Read Back By and Verified With: NESBITT,R@0815  BY MATTHEWS, B 5.12.19 Performed at [facility]    Culture (A)  Final     PROTEUS MIRABILIS SUSCEPTIBILITIES PERFORMED ON PREVIOUS CULTURE WITHIN THE LAST 5 DAYS. Performed at [facility], [place] [address], [place], [place] [address]    Report Status 04/26/2018 FINAL  Final  Culture, blood (routine x 2)     Status: Abnormal   Collection Time: 04/23/18  5:42 PM  Result Value Ref Range Status   Specimen Description   Final    BLOOD LEFT ARM Performed at [facility], [address], [place], [place] [address]    Special Requests   Final    BOTTLES DRAWN AEROBIC ONLY Blood Culture results may not be optimal due to an inadequate volume of blood received in culture bottles Performed at [place] [place], [place] [address]    Culture  Setup Time   Final    GRAM NEGATIVE RODS AEROBIC BOTTLE ONLY Gram Stain Report Called to,Read Back By and Verified With: NESBITT,R@0917  BY MATTHEWS, B 5.12.19 Performed at [place] (A)  Final   Report Status 04/26/2018 FINAL  Final   Organism ID, Bacteria PROTEUS MIRABILIS  Final      Susceptibility   Proteus mirabilis - MIC*    AMPICILLIN <=2 SENSITIVE Sensitive     CEFAZOLIN <=4 SENSITIVE Sensitive     CEFEPIME <=1 SENSITIVE Sensitive     CEFTAZIDIME <=1 SENSITIVE Sensitive     CEFTRIAXONE <=1 SENSITIVE Sensitive     CIPROFLOXACIN <=0.25 SENSITIVE Sensitive     GENTAMICIN <=1 SENSITIVE Sensitive     IMIPENEM 1 SENSITIVE Sensitive     TRIMETH/SULFA <=20 SENSITIVE Sensitive     AMPICILLIN/SULBACTAM <=2 SENSITIVE Sensitive     PIP/TAZO <=4 SENSITIVE Sensitive     * PROTEUS MIRABILIS  Blood Culture ID Panel (Reflexed)     Status: Abnormal   Collection Time: 04/23/18  5:42 PM  Result Value Ref Range Status   Enterococcus species NOT DETECTED NOT DETECTED Final   Listeria monocytogenes NOT DETECTED NOT DETECTED Final   Staphylococcus species NOT DETECTED NOT DETECTED Final   Staphylococcus aureus NOT DETECTED NOT DETECTED Final   Streptococcus species NOT  DETECTED NOT DETECTED Final   Streptococcus agalactiae NOT DETECTED NOT DETECTED Final   Streptococcus pneumoniae NOT DETECTED NOT DETECTED Final   Streptococcus pyogenes NOT DETECTED NOT DETECTED Final   Acinetobacter baumannii NOT DETECTED NOT DETECTED Final   Enterobacteriaceae species DETECTED (A) NOT DETECTED Final    Comment: Enterobacteriaceae represent a large family of gram-negative bacteria, not a single organism. CRITICAL RESULT CALLED TO, READ BACK BY AND VERIFIED WITH: NITA JOHNSTON PHRAMD AT [facility] AT 5885 ON [id number] BY SJW    Enterobacter cloacae complex NOT DETECTED NOT DETECTED Final   Escherichia coli  NOT DETECTED NOT DETECTED Final   Klebsiella oxytoca NOT DETECTED NOT DETECTED Final   Klebsiella pneumoniae NOT DETECTED NOT DETECTED Final   Proteus species DETECTED (A) NOT DETECTED Final    Comment: CRITICAL RESULT CALLED TO, READ BACK BY AND VERIFIED WITH: NICK HAYES PHARMD AT 1245 ON [id number] BY SJW    Serratia marcescens NOT DETECTED NOT DETECTED Final   Carbapenem resistance NOT DETECTED NOT DETECTED Final   Haemophilus influenzae NOT DETECTED NOT DETECTED Final   Neisseria meningitidis NOT DETECTED NOT DETECTED Final   Pseudomonas aeruginosa NOT DETECTED NOT DETECTED Final   Candida albicans NOT DETECTED NOT DETECTED Final   Candida glabrata NOT DETECTED NOT DETECTED Final   Candida krusei NOT DETECTED NOT DETECTED Final   Candida parapsilosis NOT DETECTED NOT DETECTED Final   Candida tropicalis NOT DETECTED NOT DETECTED Final    Comment: Performed at [facility], [address], [place], [place] [address]  MRSA PCR Screening     Status: None   Collection Time: 04/23/18  8:06 PM  Result Value Ref Range Status   MRSA by PCR NEGATIVE NEGATIVE Final    Comment:        The GeneXpert MRSA Assay (FDA approved for NASAL specimens only), is one component of a comprehensive MRSA colonization surveillance program. It is not intended to diagnose MRSA infection nor  to guide or monitor treatment for MRSA infections. Performed at [facility], [address], [place], [place] [address]   Culture, blood (routine x 2)     Status: None   Collection Time: 04/25/18  7:46 AM  Result Value Ref Range Status   Specimen Description BLOOD RIGHT HAND  Final   Special Requests   Final    BOTTLES DRAWN AEROBIC AND ANAEROBIC Blood Culture adequate volume   Culture   Final    NO GROWTH 5 DAYS Performed at [facility], [place] [address] [place], [place] [address]    Report Status 04/30/2018 FINAL  Final  Culture, blood (routine x 2)     Status: None   Collection Time: 04/25/18  8:00 AM  Result Value Ref Range Status   Specimen Description BLOOD RIGHT ARM  Final   Special Requests   Final    BOTTLES DRAWN AEROBIC AND ANAEROBIC Blood Culture adequate volume   Culture   Final    NO GROWTH 5 DAYS Performed at [facility], [place] [address] [place], [place] [address]    Report Status 04/30/2018 FINAL  Final         Radiology Studies: No results found.      Scheduled Meds: . amantadine  100 mg Oral BID  . feeding supplement (ENSURE ENLIVE)  237 mL Oral BID BM  . gabapentin  300 mg Oral TID  . hydrALAZINE  10 mg Intravenous Q6H  . latanoprost  1 drop Both Eyes QHS  . losartan  25 mg Oral Daily   Continuous Infusions: . cefTRIAXone (ROCEPHIN)  IV Stopped (04/30/18 1829)  . dextrose 5 % and 0.45% NaCl 1,000 mL (05/01/18 0138)  . valproate sodium Stopped (04/30/18 2314)   And  . valproate sodium Stopped (05/01/18 1056)     LOS: 8 days     Cordelia Poche, MD [facility] 05/01/2018, 12:22 PM Pager: [phone]  If 7PM-7AM, please contact night-coverage [email or web address] 05/01/2018, 12:22 PM

## 2018-05-01 NOTE — Progress Notes (Signed)
Attending was aware of low calcium that was reported by lab this AM.

## 2018-05-02 LAB — BASIC METABOLIC PANEL
ANION GAP: 4 — AB (ref 5–15)
ANION GAP: 6 (ref 5–15)
BUN: 19 mg/dL (ref 6–20)
BUN: 21 mg/dL — ABNORMAL HIGH (ref 6–20)
CALCIUM: 13.8 mg/dL — AB (ref 8.9–10.3)
CO2: 28 mmol/L (ref 22–32)
CO2: 28 mmol/L (ref 22–32)
Calcium: 14 mg/dL (ref 8.9–10.3)
Chloride: 124 mmol/L — ABNORMAL HIGH (ref 101–111)
Chloride: 121 mmol/L — ABNORMAL HIGH (ref 101–111)
Creatinine, Ser: 0.97 mg/dL (ref 0.61–1.24)
Creatinine, Ser: 1.03 mg/dL (ref 0.61–1.24)
GFR calc non Af Amer: >60 mL/min (ref >60)
GFR calc non Af Amer: >60 mL/min (ref >60)
Glucose, Bld: 136 mg/dL — ABNORMAL HIGH (ref 65–99)
Glucose, Bld: 140 mg/dL — ABNORMAL HIGH (ref 65–99)
POTASSIUM: 4 mmol/L (ref 3.5–5.1)
POTASSIUM: 4.2 mmol/L (ref 3.5–5.1)
SODIUM: 156 mmol/L — AB (ref 135–145)
SODIUM: 155 mmol/L — AB (ref 135–145)

## 2018-05-02 LAB — BASIC METABOLIC PANEL WITH GFR
Anion gap: 9 (ref 5–15)
BUN: 15 mg/dL (ref 6–20)
CO2: 28 mmol/L (ref 22–32)
Calcium: 13.9 mg/dL (ref 8.9–10.3)
Chloride: 117 mmol/L — ABNORMAL HIGH (ref 101–111)
Creatinine, Ser: 0.99 mg/dL (ref 0.61–1.24)
GFR calc Af Amer: >60 mL/min
GFR calc non Af Amer: >60 mL/min
Glucose, Bld: 82 mg/dL (ref 65–99)
Potassium: 3.3 mmol/L — ABNORMAL LOW (ref 3.5–5.1)
Sodium: 154 mmol/L — ABNORMAL HIGH (ref 135–145)

## 2018-05-02 LAB — SODIUM, URINE, RANDOM: Sodium, Ur: 68 mmol/L

## 2018-05-02 MED ORDER — DEXTROSE 5 % IV SOLN
INTRAVENOUS | Status: DC
  Administered 2018-05-02: 19:00:00 via INTRAVENOUS

## 2018-05-02 MED ORDER — POTASSIUM CHLORIDE 20 MEQ/15ML (10%) PO SOLN
40.0000 meq | Freq: Once | ORAL | Status: AC
  Administered 2018-05-02: 40 meq via ORAL
  Filled 2018-05-02: qty 30

## 2018-05-02 NOTE — Progress Notes (Signed)
CRITICAL VALUE ALERT  Critical Value:  Calcium 14  Date & Time Notied:  05/02/18 1730   Provider Notified: Dr. Lonny Prude   Orders Received/Actions taken: awaiting orders

## 2018-05-02 NOTE — Progress Notes (Signed)
Notified attending of critical lab value.

## 2018-05-02 NOTE — Progress Notes (Signed)
Patient is talking more and able convey his thoughts better.

## 2018-05-02 NOTE — Progress Notes (Signed)
Nutrition Follow-up  DOCUMENTATION CODES:   Obesity unspecified  INTERVENTION:  Continue Ensure Enlive po BID, each supplement provides 350 kcal and 20 grams of protein  MVI liquid  NUTRITION DIAGNOSIS:   Increased nutrient needs related to acute illness as evidenced by estimated needs. -ongoing  GOAL:   Patient will meet greater than or equal to 90% of their needs -prgoressing  MONITOR:   Supplement acceptance, PO intake, Weight trends  REASON FOR ASSESSMENT:   Other (Comment)(Consult for Cortrak)    ASSESSMENT:   77 y.o. male, with history of schizophrenia, dementia associated with neurosyphilis, prostate cancer was brought to hospital with altered mental status. Patient was initially nonverbal. Patient has been nonverbal drooping, right-sided facial droop not able to walk as per nursing home staff thought that has "schizophrenia might be acting up". Patient was sent to ED for further evaluation. Patient was found to have UTI, lactic acidosis, creatinine 1.5, admitted for further evaluation.  Overnight was noted to have motor aphasia with worsening right-sided hemiparesis, patient was transferred to [facility] for MRI brain to rule out CVA.  Patient tearful this morning, RN recently had fed him, patient ate 100% Discussed with RN.  Did not drink ensure this morning, was sleeping. Did receive this afternoon. Will monitor PO intake and follow-up as appropriate.  Labs reviewed:  Na 154, K+ 3.3,  Medications reviewed and include:  D5 1/2 NS at 170mL/hr --> 408 calories  Diet Order:   Diet Order           DIET - DYS 1 Room service appropriate? Yes; Fluid consistency: Thin  Diet effective now          EDUCATION NEEDS:   No education needs have been identified at this time  Skin:  Skin Assessment: Reviewed RN Assessment  Last BM:  04/29/2018  Height:   Ht Readings from Last 1 Encounters:  04/23/18 5\' 11"  (1.803 m)    Weight:   Wt Readings from Last 1  Encounters:  04/24/18 217 lb 6 oz (98.6 kg)    Ideal Body Weight:  78.18 kg  BMI:  Body mass index is 30.32 kg/m.  Estimated Nutritional Needs:   Kcal:  1775-1970 (18-20 kcal/kg)  Protein:  110-120 grams  Fluid:  >/= 2 L/day  Henry Anis. Zo Loudon, MS, RD LDN Inpatient Clinical Dietitian Pager [phone]

## 2018-05-02 NOTE — Progress Notes (Signed)
Came to visit patient and have prayer for him.  He was not very verbal-but seemed grateful for chaplain to come and spend time with him to be comforting presence. He was able to nod his head yes and no.  I asked if he was worried about someone at home and he nodded.  Prayed with him and shared Bible story and reminded him he is not alone.  He seems to take comfort in that. Conard Novak, Chaplain   05/02/18 1400  Clinical Encounter Type  Visited With Patient  Visit Type Initial;Spiritual support  Referral From Nurse  Consult/Referral To Chaplain  Spiritual Encounters  Spiritual Needs Prayer;Emotional

## 2018-05-02 NOTE — Progress Notes (Signed)
PROGRESS NOTE    Henry Smith  JSH:[id number] DOB: [birth year] DOA: 04/23/2018 PCP: Caprice Renshaw, MD   Brief Narrative: Henry Smith is a 77 y.o. male with a history of schizophrenia, dementia associate with neurosyphilis, prostate cancer, hypertension.  Patient presented secondary to worsening mental status, including becoming nonverbal with facial droop.  Initial concern for stroke, however, MRI was negative.  He was found to have a UTI with associated bacteremia and is currently on antibiotic therapy.   Assessment & Plan:   Principal Problem:   Altered mental status, unspecified Active Problems:   Schizoaffective disorder, bipolar type (HCC)   Sepsis secondary to UTI [facility])   UTI (urinary tract infection)   Acute metabolic encephalopathy   Proteus mirabilis infection   Bacteremia   Sepsis due to Gram negative bacteria ([place])   Sepsis Bacteremia/UTI secondary to Proteus Mirabilis Sensitive to ceftriaxone. -Continue ceftriaxone. Will treat for 10 days  Acute metabolic encephalopathy In setting of acute infection in setting of underlying dementia secondary to neurosyphilis.  Patient is far from baseline.  MRI was negative for acute finding that could explain his mental status change. EEG without evidence of seizure activity. Continues to improve slowly -Continue treatment as mentioned above for infection.  Schizoaffective disorder Psychiatry consulted for evaluation. -Continue Depakote  Dysphasia Unknown etiology.  Possibly related to underlying dementia and schizophrenia vs metabolic encephalopathy from infection. Improving with improvement of encephalopathy -Continue IV fluids -SLP recommendations -Continue to encourage oral intake  Acute kidney injury Resolved.  Essential hypertension Uncontrolled.  -Continue hydralazine 10 mg every 6 hours IV prn -Continue losartan  Thrombocytopenia Chronic. Improved slightly, but otherwise  stable.  Hypernatremia Worsened. Secondary to poor oral intake. No BMP available today -Encourage oral intake -Repeat BMP today pending -D5 water  Hypercalcemia History of prostate cancer. -intact PTH pending -IV fluids (changed to D5 water)  Hypokalemia -Give potassium chloride supplementation by mouth   DVT prophylaxis: Lovenox Code Status:   Code Status: Full Code Family Communication: None at bedside. Disposition Plan: Discharge to SNF when medically stable   Consultants:   Neurology  Procedures:   EEG (5/16) IMPRESSION:  This is an abnormal EEG demonstrating a mild to moderate diffuse slowing of electrocerebral activity.  This can be seen in a wide variety of encephalopathic state including those of a toxic, metabolic, or degenerative nature.  There were no focal, hemispheric, or lateralizing features.  No epileptiform activity was recorded.   Antimicrobials:  Levaquin (5/11)  Aztreonam (5/11>>5/12)  Ceftriaxone (5/12>>   Subjective: No issues  Objective: Vitals:   05/01/18 1928 05/01/18 2255 05/02/18 0457 05/02/18 1129  BP: 126/60 (!) 155/79 (!) 177/90 (!) 155/106  Pulse: (!) 103 93 85 (!) 106  Resp: 18 18 18 17   Temp: 98.3 F (36.8 C) 97.6 F (36.4 C) 98.1 F (36.7 C) 97.7 F (36.5 C)  TempSrc: Axillary Axillary Oral Oral  SpO2: 98% 100% 98% 100%  Weight:      Height:        Intake/Output Summary (Last 24 hours) at 05/02/2018 1130 Last data filed at 05/02/2018 0800 Gross per 24 hour  Intake 3264.17 ml  Output 2500 ml  Net 764.17 ml   Filed Weights   04/23/18 1429 04/23/18 2025 04/24/18 0548  Weight: 90.7 kg (200 lb) 98.2 kg (216 lb 7.9 oz) 98.6 kg (217 lb 6 oz)    Examination:  General exam: Appears calm and comfortable Respiratory system: Clear to auscultation. Respiratory effort normal. Cardiovascular system: S1 & S2 heard,  RRR. No murmurs, rubs, gallops or clicks. Gastrointestinal system: Abdomen is nondistended, soft and  nontender. Normal bowel sounds heard. Central nervous system: Alert and difficult to assess orientation secondary to poor communication Extremities: No edema. No calf tenderness Skin: No cyanosis. No rashes Psychiatry: Flat affect    Data Reviewed: I have personally reviewed following labs and imaging studies  CBC: Recent Labs  Lab 04/27/18 0808 04/28/18 0335 04/30/18 0410  WBC 8.2 9.0 7.3  HGB 14.5 14.5 13.1  HCT 44.8 44.5 41.1  MCV 85.3 84.6 85.8  PLT 83* 95* 086*   Basic Metabolic Panel: Recent Labs  Lab 04/27/18 0808 04/28/18 0335 04/29/18 0342 05/01/18 0537 05/02/18 0711  NA 147* 146* 149* 154* 154*  K 3.8 3.6 3.5 3.7 3.3*  CL 117* 111 117* 118* 117*  CO2 26 28 27 26 28   GLUCOSE 100* 153* 97 94 82  BUN 17 17 18 18 15   CREATININE 0.85 1.00 0.82 0.92 0.99  CALCIUM 12.4* 12.5* 13.0* 13.4* 13.9*   GFR: Estimated Creatinine Clearance: 78.3 mL/min (by C-G formula based on SCr of 0.99 mg/dL). Liver Function Tests: No results for input(s): AST, ALT, ALKPHOS, BILITOT, PROT, ALBUMIN in the last 168 hours. No results for input(s): LIPASE, AMYLASE in the last 168 hours. No results for input(s): AMMONIA in the last 168 hours. Coagulation Profile: No results for input(s): INR, PROTIME in the last 168 hours. Cardiac Enzymes: No results for input(s): CKTOTAL, CKMB, CKMBINDEX, TROPONINI in the last 168 hours. BNP (last 3 results) No results for input(s): PROBNP in the last 8760 hours. HbA1C: No results for input(s): HGBA1C in the last 72 hours. CBG: Recent Labs  Lab 04/30/18 0809 04/30/18 1125 04/30/18 1630 04/30/18 1957 05/01/18 0020  GLUCAP 83 79 81 116* 88   Lipid Profile: No results for input(s): CHOL, HDL, LDLCALC, TRIG, CHOLHDL, LDLDIRECT in the last 72 hours. Thyroid Function Tests: No results for input(s): TSH, T4TOTAL, FREET4, T3FREE, THYROIDAB in the last 72 hours. Anemia Panel: No results for input(s): VITAMINB12, FOLATE, FERRITIN, TIBC, IRON,  RETICCTPCT in the last 72 hours. Sepsis Labs: No results for input(s): PROCALCITON, LATICACIDVEN in the last 168 hours.  Recent Results (from the past 240 hour(s))  Urine culture     Status: Abnormal   Collection Time: 04/23/18  3:40 PM  Result Value Ref Range Status   Specimen Description   Final    URINE, CATHETERIZED Performed at [facility], [address], [place], [place] [address]    Special Requests   Final    NONE Performed at [facility], [address] [place], [place] [address]    Culture >=100,000 COLONIES/mL PROTEUS MIRABILIS (A)  Final   Report Status 04/25/2018 FINAL  Final   Organism ID, Bacteria PROTEUS MIRABILIS (A)  Final      Susceptibility   Proteus mirabilis - MIC*    AMPICILLIN <=2 SENSITIVE Sensitive     CEFAZOLIN <=4 SENSITIVE Sensitive     CEFTRIAXONE <=1 SENSITIVE Sensitive     CIPROFLOXACIN <=0.25 SENSITIVE Sensitive     GENTAMICIN <=1 SENSITIVE Sensitive     IMIPENEM 1 SENSITIVE Sensitive     NITROFURANTOIN 128 RESISTANT Resistant     TRIMETH/SULFA <=20 SENSITIVE Sensitive     AMPICILLIN/SULBACTAM <=2 SENSITIVE Sensitive     PIP/TAZO <=4 SENSITIVE Sensitive     * >=100,000 COLONIES/mL PROTEUS MIRABILIS  Culture, blood (routine x 2)     Status: Abnormal   Collection Time: 04/23/18  5:41 PM  Result Value  Ref Range Status   Specimen Description   Final    RIGHT ANTECUBITAL Performed at [facility], [address] [place], [place] [address]    Special Requests   Final    BOTTLES DRAWN AEROBIC AND ANAEROBIC Blood Culture adequate volume Performed at [facility], [address], [place], [place] [address]    Culture  Setup Time   Final    GRAM NEGATIVE RODS IN BOTH AEROBIC AND ANAEROBIC BOTTLES Gram Stain Report Called to,Read Back By and Verified With: NESBITT,R@0815  BY MATTHEWS, B 5.12.19 Performed at [facility]    Culture (A)  Final    PROTEUS MIRABILIS SUSCEPTIBILITIES PERFORMED ON PREVIOUS CULTURE WITHIN THE LAST 5  DAYS. Performed at [facility], [place] [address], [place], [place] [address]    Report Status 04/26/2018 FINAL  Final  Culture, blood (routine x 2)     Status: Abnormal   Collection Time: 04/23/18  5:42 PM  Result Value Ref Range Status   Specimen Description   Final    BLOOD LEFT ARM Performed at [facility], [address], [place], [place] [address]    Special Requests   Final    BOTTLES DRAWN AEROBIC ONLY Blood Culture results may not be optimal due to an inadequate volume of blood received in culture bottles Performed at [place] [place], [place] [address]    Culture  Setup Time   Final    GRAM NEGATIVE RODS AEROBIC BOTTLE ONLY Gram Stain Report Called to,Read Back By and Verified With: NESBITT,R@0917  BY MATTHEWS, B 5.12.19 Performed at [facility] (A)  Final   Report Status 04/26/2018 FINAL  Final   Organism ID, Bacteria PROTEUS MIRABILIS  Final      Susceptibility   Proteus mirabilis - MIC*    AMPICILLIN <=2 SENSITIVE Sensitive     CEFAZOLIN <=4 SENSITIVE Sensitive     CEFEPIME <=1 SENSITIVE Sensitive     CEFTAZIDIME <=1 SENSITIVE Sensitive     CEFTRIAXONE <=1 SENSITIVE Sensitive     CIPROFLOXACIN <=0.25 SENSITIVE Sensitive     GENTAMICIN <=1 SENSITIVE Sensitive     IMIPENEM 1 SENSITIVE Sensitive     TRIMETH/SULFA <=20 SENSITIVE Sensitive     AMPICILLIN/SULBACTAM <=2 SENSITIVE Sensitive     PIP/TAZO <=4 SENSITIVE Sensitive     * PROTEUS MIRABILIS  Blood Culture ID Panel (Reflexed)     Status: Abnormal   Collection Time: 04/23/18  5:42 PM  Result Value Ref Range Status   Enterococcus species NOT DETECTED NOT DETECTED Final   Listeria monocytogenes NOT DETECTED NOT DETECTED Final   Staphylococcus species NOT DETECTED NOT DETECTED Final   Staphylococcus aureus NOT DETECTED NOT DETECTED Final   Streptococcus species NOT DETECTED NOT DETECTED Final   Streptococcus agalactiae NOT DETECTED NOT DETECTED Final    Streptococcus pneumoniae NOT DETECTED NOT DETECTED Final   Streptococcus pyogenes NOT DETECTED NOT DETECTED Final   Acinetobacter baumannii NOT DETECTED NOT DETECTED Final   Enterobacteriaceae species DETECTED (A) NOT DETECTED Final    Comment: Enterobacteriaceae represent a large family of gram-negative bacteria, not a single organism. CRITICAL RESULT CALLED TO, READ BACK BY AND VERIFIED WITH: NITA JOHNSTON PHRAMD AT [facility] AT 4132 ON [id number] BY SJW    Enterobacter cloacae complex NOT DETECTED NOT DETECTED Final   Escherichia coli NOT DETECTED NOT DETECTED Final   Klebsiella oxytoca NOT DETECTED NOT DETECTED Final   Klebsiella pneumoniae NOT DETECTED NOT DETECTED  Final   Proteus species DETECTED (A) NOT DETECTED Final    Comment: CRITICAL RESULT CALLED TO, READ BACK BY AND VERIFIED WITH: NICK HAYES PHARMD AT 1245 ON [id number] BY SJW    Serratia marcescens NOT DETECTED NOT DETECTED Final   Carbapenem resistance NOT DETECTED NOT DETECTED Final   Haemophilus influenzae NOT DETECTED NOT DETECTED Final   Neisseria meningitidis NOT DETECTED NOT DETECTED Final   Pseudomonas aeruginosa NOT DETECTED NOT DETECTED Final   Candida albicans NOT DETECTED NOT DETECTED Final   Candida glabrata NOT DETECTED NOT DETECTED Final   Candida krusei NOT DETECTED NOT DETECTED Final   Candida parapsilosis NOT DETECTED NOT DETECTED Final   Candida tropicalis NOT DETECTED NOT DETECTED Final    Comment: Performed at [facility], [address], [place], [place] [address]  MRSA PCR Screening     Status: None   Collection Time: 04/23/18  8:06 PM  Result Value Ref Range Status   MRSA by PCR NEGATIVE NEGATIVE Final    Comment:        The GeneXpert MRSA Assay (FDA approved for NASAL specimens only), is one component of a comprehensive MRSA colonization surveillance program. It is not intended to diagnose MRSA infection nor to guide or monitor treatment for MRSA infections. Performed at [facility],  [address] [place], [place] [address]   Culture, blood (routine x 2)     Status: None   Collection Time: 04/25/18  7:46 AM  Result Value Ref Range Status   Specimen Description BLOOD RIGHT HAND  Final   Special Requests   Final    BOTTLES DRAWN AEROBIC AND ANAEROBIC Blood Culture adequate volume   Culture   Final    NO GROWTH 5 DAYS Performed at [facility], [place] [address] [place], [place] [address]    Report Status 04/30/2018 FINAL  Final  Culture, blood (routine x 2)     Status: None   Collection Time: 04/25/18  8:00 AM  Result Value Ref Range Status   Specimen Description BLOOD RIGHT ARM  Final   Special Requests   Final    BOTTLES DRAWN AEROBIC AND ANAEROBIC Blood Culture adequate volume   Culture   Final    NO GROWTH 5 DAYS Performed at [facility], [place] [address], [place], [place] [address]    Report Status 04/30/2018 FINAL  Final         Radiology Studies: No results found.      Scheduled Meds: . amantadine  100 mg Oral BID  . feeding supplement (ENSURE ENLIVE)  237 mL Oral BID BM  . gabapentin  300 mg Oral TID  . hydrALAZINE  10 mg Intravenous Q6H  . latanoprost  1 drop Both Eyes QHS  . losartan  25 mg Oral Daily  . potassium chloride  40 mEq Oral Once   Continuous Infusions: . cefTRIAXone (ROCEPHIN)  IV 2 g (05/01/18 1700)  . dextrose 5 % and 0.45% NaCl 100 mL/hr at 05/01/18 1359  . valproate sodium Stopped (05/01/18 2324)   And  . valproate sodium Stopped (05/02/18 1045)     LOS: 9 days     Cordelia Poche, MD [facility] 05/02/2018, 11:30 AM Pager: [phone]  If 7PM-7AM, please contact night-coverage [email or web address] 05/02/2018, 11:30 AM

## 2018-05-02 NOTE — Progress Notes (Signed)
  Speech Language Pathology Treatment: Dysphagia  Patient Details Name: Henry Smith MRN: [id number] DOB: [birth year] Today's Date: 05/02/2018 Time: 7290-2111 SLP Time Calculation (min) (ACUTE ONLY): 18 min  Assessment / Plan / Recommendation Clinical Impression  Skilled treatment session focused on dysphagia goals. SLP facilitated session by providing multiple items of puree and thin liquids via straw. Pt with increased sustained attention to PO intake and increased consumption over previous sessions. Pt with good oral clearing and no overt s/s of aspiration. Education provided to nursing on pt's increased abilities. Nursing also provides that pt has demonstrated increased intake throughout day. Will attempt trials of dysphagia 2 at next session.    HPI HPI: Pt is a 77 y.o. male admitted 04/23/18 with AMS, including non-verbal, not walking and R-side facial droop. Worked up for encephalopathy secondary to sepsis due to UTI, AKI. Head CT and MRI show no acute intracranial finding; mild small vessel change normal for age. PMH includes dementia, schizophrenia, prostate CA      SLP Plan  Continue with current plan of care       Recommendations  Diet recommendations: Dysphagia 1 (puree);Thin liquid Liquids provided via: Cup;Straw Medication Administration: Crushed with puree Supervision: Full supervision/cueing for compensatory strategies Compensations: Follow solids with liquid;Minimize environmental distractions Postural Changes and/or Swallow Maneuvers: Seated upright 90 degrees                Oral Care Recommendations: Oral care QID Follow up Recommendations: Skilled Nursing facility SLP Visit Diagnosis: Dysphagia, oropharyngeal phase (R13.12) Plan: Continue with current plan of care       GO                Suleman Gunning 05/02/2018, 2:51 PM

## 2018-05-02 NOTE — Progress Notes (Signed)
CSW following for discharge planning. CSW to follow and facilitate return to SNF when medically stable.  Laveda Abbe, [place] Clinical Social Worker [phone]

## 2018-05-02 NOTE — Progress Notes (Signed)
Pt alert and commuicating request for food/fluids. Pt ate well. Calcium level 13.9. Notified MD> will cont to monitor.

## 2018-05-03 LAB — BASIC METABOLIC PANEL
ANION GAP: 7 (ref 5–15)
Anion gap: 3 — ABNORMAL LOW (ref 5–15)
Anion gap: 3 — ABNORMAL LOW (ref 5–15)
Anion gap: 4 — ABNORMAL LOW (ref 5–15)
Anion gap: 7 (ref 5–15)
BUN: 18 mg/dL (ref 6–20)
BUN: 18 mg/dL (ref 6–20)
BUN: 20 mg/dL (ref 6–20)
BUN: 20 mg/dL (ref 6–20)
BUN: 21 mg/dL — ABNORMAL HIGH (ref 6–20)
CALCIUM: 13.9 mg/dL — AB (ref 8.9–10.3)
CALCIUM: 13.6 mg/dL — AB (ref 8.9–10.3)
CHLORIDE: 120 mmol/L — AB (ref 101–111)
CHLORIDE: 123 mmol/L — AB (ref 101–111)
CO2: 28 mmol/L (ref 22–32)
CO2: 29 mmol/L (ref 22–32)
CO2: 29 mmol/L (ref 22–32)
CO2: 30 mmol/L (ref 22–32)
CO2: 31 mmol/L (ref 22–32)
CREATININE: 1.07 mg/dL (ref 0.61–1.24)
Calcium: 13.5 mg/dL (ref 8.9–10.3)
Calcium: 13.9 mg/dL (ref 8.9–10.3)
Calcium: 13.9 mg/dL (ref 8.9–10.3)
Chloride: 119 mmol/L — ABNORMAL HIGH (ref 101–111)
Chloride: 122 mmol/L — ABNORMAL HIGH (ref 101–111)
Chloride: 123 mmol/L — ABNORMAL HIGH (ref 101–111)
Creatinine, Ser: 0.94 mg/dL (ref 0.61–1.24)
Creatinine, Ser: 0.96 mg/dL (ref 0.61–1.24)
Creatinine, Ser: 1.08 mg/dL (ref 0.61–1.24)
Creatinine, Ser: 1.11 mg/dL (ref 0.61–1.24)
GFR calc Af Amer: >60 mL/min (ref >60)
GFR calc Af Amer: >60 mL/min (ref >60)
GFR calc Af Amer: >60 mL/min (ref >60)
GFR calc non Af Amer: >60 mL/min (ref >60)
GFR calc non Af Amer: >60 mL/min (ref >60)
GLUCOSE: 93 mg/dL (ref 65–99)
GLUCOSE: 130 mg/dL — AB (ref 65–99)
Glucose, Bld: 127 mg/dL — ABNORMAL HIGH (ref 65–99)
Glucose, Bld: 87 mg/dL (ref 65–99)
Glucose, Bld: 94 mg/dL (ref 65–99)
POTASSIUM: 4.4 mmol/L (ref 3.5–5.1)
POTASSIUM: 3.8 mmol/L (ref 3.5–5.1)
Potassium: 3.7 mmol/L (ref 3.5–5.1)
Potassium: 4 mmol/L (ref 3.5–5.1)
Potassium: 4.5 mmol/L (ref 3.5–5.1)
SODIUM: 155 mmol/L — AB (ref 135–145)
SODIUM: 155 mmol/L — AB (ref 135–145)
Sodium: 155 mmol/L — ABNORMAL HIGH (ref 135–145)
Sodium: 156 mmol/L — ABNORMAL HIGH (ref 135–145)
Sodium: 157 mmol/L — ABNORMAL HIGH (ref 135–145)

## 2018-05-03 LAB — GLUCOSE, CAPILLARY
GLUCOSE-CAPILLARY: 85 mg/dL (ref 65–99)
GLUCOSE-CAPILLARY: 93 mg/dL (ref 65–99)
GLUCOSE-CAPILLARY: 107 mg/dL — AB (ref 65–99)
Glucose-Capillary: 104 mg/dL — ABNORMAL HIGH (ref 65–99)

## 2018-05-03 LAB — ALBUMIN: Albumin: 2.3 g/dL — ABNORMAL LOW (ref 3.5–5.0)

## 2018-05-03 MED ORDER — ZOLEDRONIC ACID 4 MG/5ML IV CONC
4.0000 mg | Freq: Once | INTRAVENOUS | Status: AC
  Administered 2018-05-03: 4 mg via INTRAVENOUS
  Filled 2018-05-03: qty 5

## 2018-05-03 MED ORDER — SODIUM CHLORIDE 0.45 % IV SOLN
INTRAVENOUS | Status: DC
  Administered 2018-05-03 – 2018-05-04 (×3): via INTRAVENOUS

## 2018-05-03 MED ORDER — CALCITONIN (SALMON) 200 UNIT/ML IJ SOLN
4.0000 [IU]/kg | Freq: Two times a day (BID) | INTRAMUSCULAR | Status: AC
  Administered 2018-05-03 – 2018-05-04 (×4): 394 [IU] via INTRAMUSCULAR
  Filled 2018-05-03 (×5): qty 1.97

## 2018-05-03 MED ORDER — HYDROCHLOROTHIAZIDE 25 MG PO TABS: 25.0000 mg | ORAL_TABLET | Freq: Every day | ORAL | Status: DC

## 2018-05-03 MED ORDER — AMANTADINE HCL 50 MG/5ML PO SYRP
100.0000 mg | ORAL_SOLUTION | Freq: Two times a day (BID) | ORAL | Status: DC
  Administered 2018-05-03 – 2018-05-04 (×4): 100 mg via ORAL
  Filled 2018-05-03 (×5): qty 10

## 2018-05-03 NOTE — Progress Notes (Addendum)
Critical lab value Cal. 13.8 will notify attending

## 2018-05-03 NOTE — Progress Notes (Signed)
Occupational Therapy Treatment Patient Details Name: Henry Smith MRN: [id number] DOB: 1941/08/12 Today's Date: 05/03/2018    History of present illness Pt is a 77 y.o. male admitted 04/23/18 with AMS, including non-verbal, not walking and R-side facial droop. Worked up for encephalopathy secondary to sepsis due to UTI, AKI. Head CT and MRI show no acute intracranial finding; mild small vessel change normal for age. PMH includes dementia, schizophrenia, prostate CA.   OT comments  Pt initiated bed mobility and transfer to chair this session. Pt motivated to participate but requires total +2 Max (A0 with all transfers. Pt noted to have lip slapping, jaw chattering and tremors bil Ue during session. Pt in chair positioned with pillows due to lateral lean.Recommend HOYER lift only for RN staff   Follow Up Recommendations  SNF    Equipment Recommendations  None recommended by OT    Recommendations for Other Services PT consult;Speech consult    Precautions / Restrictions Precautions Precautions: Fall Restrictions Weight Bearing Restrictions: No       Mobility Bed Mobility Overal bed mobility: Needs Assistance Bed Mobility: Rolling;Sidelying to Sit Rolling: +2 for physical assistance;Max assist Sidelying to sit: Max assist;+2 for physical assistance       General bed mobility comments: assist required at [place] and trunk; pt required cues for sequencing and hand over hand assist to reach toward L side   Transfers Overall transfer level: Needs assistance Equipment used: 2 person hand held assist(+2 face to face with gait belt and bed pad) Transfers: Squat Pivot Transfers     Squat pivot transfers: Max assist;+2 physical assistance     General transfer comment: cues for sequencing; assist to power up and pt unable to ahcieve full standing; assistance to pivot L foot and to guide hips to recliner; knees blocked    Balance Overall balance assessment: Needs  assistance Sitting-balance support: Bilateral upper extremity supported;Feet supported(external support) Sitting balance-Leahy Scale: Poor                                     ADL either performed or assessed with clinical judgement   ADL Overall ADL's : Needs assistance/impaired Eating/Feeding: Total assistance Eating/Feeding Details (indicate cue type and reason): pt hand over hand to initiate eating and opening mouth automatic at seeing food                 Lower Body Dressing: Total assistance Lower Body Dressing Details (indicate cue type and reason): don socks. pt does not move foot or activate with cues   Toilet Transfer Details (indicate cue type and reason): simulated EOB to chair. Total+2 Max (A) with (A) to advance LE           General ADL Comments: pt verbalized during session and repeats the same response several times. pt continueing to repeat response without new question     Vision       Perception     Praxis      Cognition Arousal/Alertness: Awake/alert Behavior During Therapy: Flat affect Overall Cognitive Status: No family/caregiver present to determine baseline cognitive functioning Area of Impairment: Attention;Following commands;Awareness;Safety/judgement;Problem solving                   Current Attention Level: Focused   Following Commands: Follows one step commands inconsistently;Follows one step commands with increased time Safety/Judgement: Decreased awareness of safety Awareness: Intellectual Problem Solving: Slow processing;Decreased initiation;Requires verbal  cues General Comments: pt responding yes with head nods and needs max cues to confirm that head nod is not a tremor movement        Exercises     Shoulder Instructions       General Comments      Pertinent Vitals/ Pain       Pain Assessment: Faces Faces Pain Scale: No hurt  Home Living                                           Prior Functioning/Environment              Frequency  Min 2X/week        Progress Toward Goals  OT Goals(current goals can now be found in the care plan section)  Progress towards OT goals: OT to reassess next treatment  Acute Rehab OT Goals Patient Stated Goal: Unstated OT Goal Formulation: Patient unable to participate in goal setting Time For Goal Achievement: 05/08/18 Potential to Achieve Goals: Fair ADL Goals Pt Will Perform Grooming: standing;with min assist Pt Will Perform Upper Body Dressing: with min assist;sitting Pt Will Perform Lower Body Dressing: with min assist;sit to/from stand Pt Will Transfer to Toilet: with min assist;stand pivot transfer;bedside commode Pt Will Perform Toileting - Clothing Manipulation and hygiene: with min assist;sit to/from stand  Plan Discharge plan remains appropriate    Co-evaluation    PT/OT/SLP Co-Evaluation/Treatment: Yes Reason for Co-Treatment: Complexity of the patient's impairments (multi-system involvement);For patient/therapist safety;Necessary to address cognition/behavior during functional activity;To address functional/ADL transfers PT goals addressed during session: Mobility/safety with mobility OT goals addressed during session: ADL's and self-care;Proper use of Adaptive equipment and DME;Strengthening/ROM      AM-PAC PT "6 Clicks" Daily Activity     Outcome Measure   Help from another person eating meals?: A Lot Help from another person taking care of personal grooming?: A Lot Help from another person toileting, which includes using toliet, bedpan, or urinal?: Total Help from another person bathing (including washing, rinsing, drying)?: A Lot Help from another person to put on and taking off regular upper body clothing?: A Lot Help from another person to put on and taking off regular lower body clothing?: Total 6 Click Score: 10    End of Session Equipment Utilized During Treatment: Gait belt  OT Visit  Diagnosis: Unsteadiness on feet (R26.81);Other abnormalities of gait and mobility (R26.89);Muscle weakness (generalized) (M62.81);Other symptoms and signs involving cognitive function   Activity Tolerance Patient tolerated treatment well   Patient Left in chair;with call bell/phone within reach(RN and tech informed)   Nurse Communication Mobility status;Precautions        Time: 3557-3220 OT Time Calculation (min): 26 min  Charges: OT General Charges $OT Visit: 1 Visit OT Treatments $Self Care/Home Management : 8-22 mins   Jeri Modena   OTR/L Pager: [phone] Office: [phone] .    Parke Poisson B 05/03/2018, 3:30 PM

## 2018-05-03 NOTE — Progress Notes (Addendum)
PROGRESS NOTE    Henry Smith  TFT:[id number] DOB: Apr 26, 1941 DOA: 04/23/2018 PCP: Caprice Renshaw, MD   Brief Narrative: Henry Smith is a 77 y.o. male with a history of schizophrenia, dementia associate with neurosyphilis, prostate cancer, hypertension.  Patient presented secondary to worsening mental status, including becoming nonverbal with facial droop.  Initial concern for stroke, however, MRI was negative.  He was found to have a UTI with associated bacteremia and is currently on antibiotic therapy.   Assessment & Plan:   Principal Problem:   Altered mental status, unspecified Active Problems:   Schizoaffective disorder, bipolar type (HCC)   Sepsis secondary to UTI [facility])   UTI (urinary tract infection)   Acute metabolic encephalopathy   Proteus mirabilis infection   Bacteremia   Sepsis due to Gram negative bacteria ([place])   Sepsis Bacteremia/UTI secondary to Proteus Mirabilis Sensitive to ceftriaxone. Completed course.  Acute metabolic encephalopathy In setting of acute infection in setting of underlying dementia secondary to neurosyphilis.  Patient is far from baseline.  MRI was negative for acute finding that could explain his mental status change. EEG without evidence of seizure activity. Was improving but now worsened in setting of hypercalcemia/hypernatremia.  Hypernatremia Worsened with change in mental status. Looking more like DI possibly nephrogenic secondary to hypercalcemia -Encourage oral intake -Switched to 1/2 NS for treatment of hypercalcemia -Frequent BMP (q4 hours until improvement)  Hypercalcemia History of prostate cancer. -intact PTH ordered but sample apparently hemolyzed. Repeat. -1/2 NS -Calcitonin and Zometa  Schizoaffective disorder Psychiatry consulted for evaluation. -Continue Depakote  Dysphasia Unknown etiology.  Possibly related to underlying dementia and schizophrenia vs metabolic encephalopathy from infection. Improving with  improvement of encephalopathy -Continue IV fluids -SLP recommendations -Continue to encourage oral intake  Acute kidney injury Resolved.  Essential hypertension Uncontrolled.  -Continue hydralazine 10 mg every 6 hours IV prn -Continue losartan  Thrombocytopenia Chronic. Improved slightly, but otherwise stable.  Hypokalemia Resolved with supplementation   DVT prophylaxis: Lovenox Code Status:   Code Status: Full Code Family Communication: None at bedside. Disposition Plan: Discharge to SNF when medically stable   Consultants:   Neurology  Procedures:   EEG (5/16) IMPRESSION:  This is an abnormal EEG demonstrating a mild to moderate diffuse slowing of electrocerebral activity.  This can be seen in a wide variety of encephalopathic state including those of a toxic, metabolic, or degenerative nature.  There were no focal, hemispheric, or lateralizing features.  No epileptiform activity was recorded.   Antimicrobials:  Levaquin (5/11)  Aztreonam (5/11>>5/12)  Ceftriaxone (5/12>>   Subjective: Patient states no issues with head shake.  Objective: Vitals:   05/02/18 2021 05/03/18 0003 05/03/18 0421 05/03/18 0747  BP: (!) 144/76 (!) 142/83 (!) 160/88 (!) 155/92  Pulse: (!) 111 (!) 113 94 (!) 106  Resp: 18 20 20 17   Temp: 98.7 F (37.1 C) 98.3 F (36.8 C) 98.2 F (36.8 C) 98.1 F (36.7 C)  TempSrc: Oral Oral Oral Oral  SpO2: 95% 94% 96% 96%  Weight:      Height:        Intake/Output Summary (Last 24 hours) at 05/03/2018 1031 Last data filed at 05/03/2018 0957 Gross per 24 hour  Intake 1927.83 ml  Output 3475 ml  Net -1547.17 ml   Filed Weights   04/23/18 1429 04/23/18 2025 04/24/18 0548  Weight: 90.7 kg (200 lb) 98.2 kg (216 lb 7.9 oz) 98.6 kg (217 lb 6 oz)    Examination:  General exam: Appears calm and comfortable  Respiratory system: Clear to auscultation but diminished on anterior auscultation. Respiratory effort normal. Cardiovascular  system: S1 & S2 heard, RRR. No murmurs, rubs, gallops or clicks. Gastrointestinal system: Abdomen is nondistended, soft and nontender. Normal bowel sounds heard. Central nervous system: Somnolent today but answers questions with head nods/shakes. Extremities: No calf tenderness Skin: No cyanosis. No rashes Psychiatry: Judgement and insight appear impaired. Flat affect.    Data Reviewed: I have personally reviewed following labs and imaging studies  CBC: Recent Labs  Lab 04/27/18 0808 04/28/18 0335 04/30/18 0410  WBC 8.2 9.0 7.3  HGB 14.5 14.5 13.1  HCT 44.8 44.5 41.1  MCV 85.3 84.6 85.8  PLT 83* 95* 161*   Basic Metabolic Panel: Recent Labs  Lab 05/02/18 1541 05/02/18 1941 05/02/18 2355 05/03/18 0422 05/03/18 0722  NA 156* 155* 157* 155* 155*  K 4.0 4.2 4.0 4.5 4.4  CL 124* 121* 123* 120* 119*  CO2 28 28 31 28 29   GLUCOSE 136* 140* 127* 94 87  BUN 19 21* 21* 20 20  CREATININE 0.97 1.03 1.11 1.07 1.08  CALCIUM 14.0* 13.8* 13.9* 13.9* 13.9*   GFR: Estimated Creatinine Clearance: 71.8 mL/min (by C-G formula based on SCr of 1.08 mg/dL). Liver Function Tests: No results for input(s): AST, ALT, ALKPHOS, BILITOT, PROT, ALBUMIN in the last 168 hours. No results for input(s): LIPASE, AMYLASE in the last 168 hours. No results for input(s): AMMONIA in the last 168 hours. Coagulation Profile: No results for input(s): INR, PROTIME in the last 168 hours. Cardiac Enzymes: No results for input(s): CKTOTAL, CKMB, CKMBINDEX, TROPONINI in the last 168 hours. BNP (last 3 results) No results for input(s): PROBNP in the last 8760 hours. HbA1C: No results for input(s): HGBA1C in the last 72 hours. CBG: Recent Labs  Lab 04/30/18 1125 04/30/18 1630 04/30/18 1957 05/01/18 0020 05/03/18 0822  GLUCAP 79 81 116* 88 85   Lipid Profile: No results for input(s): CHOL, HDL, LDLCALC, TRIG, CHOLHDL, LDLDIRECT in the last 72 hours. Thyroid Function Tests: No results for input(s): TSH,  T4TOTAL, FREET4, T3FREE, THYROIDAB in the last 72 hours. Anemia Panel: No results for input(s): VITAMINB12, FOLATE, FERRITIN, TIBC, IRON, RETICCTPCT in the last 72 hours. Sepsis Labs: No results for input(s): PROCALCITON, LATICACIDVEN in the last 168 hours.  Recent Results (from the past 240 hour(s))  Urine culture     Status: Abnormal   Collection Time: 04/23/18  3:40 PM  Result Value Ref Range Status   Specimen Description   Final    URINE, CATHETERIZED Performed at [facility], [address] [place], [place] [address]    Special Requests   Final    NONE Performed at [facility], [address], [place],  [address]    Culture >=100,000 COLONIES/mL PROTEUS MIRABILIS (A)  Final   Report Status 04/25/2018 FINAL  Final   Organism ID, Bacteria PROTEUS MIRABILIS (A)  Final      Susceptibility   Proteus mirabilis - MIC*    AMPICILLIN <=2 SENSITIVE Sensitive     CEFAZOLIN <=4 SENSITIVE Sensitive     CEFTRIAXONE <=1 SENSITIVE Sensitive     CIPROFLOXACIN <=0.25 SENSITIVE Sensitive     GENTAMICIN <=1 SENSITIVE Sensitive     IMIPENEM 1 SENSITIVE Sensitive     NITROFURANTOIN 128 RESISTANT Resistant     TRIMETH/SULFA <=20 SENSITIVE Sensitive     AMPICILLIN/SULBACTAM <=2 SENSITIVE Sensitive     PIP/TAZO <=4 SENSITIVE Sensitive     * >=100,000 COLONIES/mL PROTEUS MIRABILIS  Culture, blood (  routine x 2)     Status: Abnormal   Collection Time: 04/23/18  5:41 PM  Result Value Ref Range Status   Specimen Description   Final    RIGHT ANTECUBITAL Performed at [facility], [address] [place], [place] [address]    Special Requests   Final    BOTTLES DRAWN AEROBIC AND ANAEROBIC Blood Culture adequate volume Performed at [facility], [address], [place], [place] [address]    Culture  Setup Time   Final    GRAM NEGATIVE RODS IN BOTH AEROBIC AND ANAEROBIC BOTTLES Gram Stain Report Called to,Read Back By and Verified With: NESBITT,R@0815  BY MATTHEWS, B 5.12.19 Performed at  [facility]    Culture (A)  Final    PROTEUS MIRABILIS SUSCEPTIBILITIES PERFORMED ON PREVIOUS CULTURE WITHIN THE LAST 5 DAYS. Performed at [facility], [place] [name], [place], [place] [address]    Report Status 04/26/2018 FINAL  Final  Culture, blood (routine x 2)     Status: Abnormal   Collection Time: 04/23/18  5:42 PM  Result Value Ref Range Status   Specimen Description   Final    BLOOD LEFT ARM Performed at [facility], [address], [place], [place] [address]    Special Requests   Final    BOTTLES DRAWN AEROBIC ONLY Blood Culture results may not be optimal due to an inadequate volume of blood received in culture bottles Performed at [place] [place], [place] [address]    Culture  Setup Time   Final    GRAM NEGATIVE RODS AEROBIC BOTTLE ONLY Gram Stain Report Called to,Read Back By and Verified With: NESBITT,R@0917  BY MATTHEWS, B 5.12.19 Performed at [place] (A)  Final   Report Status 04/26/2018 FINAL  Final   Organism ID, Bacteria PROTEUS MIRABILIS  Final      Susceptibility   Proteus mirabilis - MIC*    AMPICILLIN <=2 SENSITIVE Sensitive     CEFAZOLIN <=4 SENSITIVE Sensitive     CEFEPIME <=1 SENSITIVE Sensitive     CEFTAZIDIME <=1 SENSITIVE Sensitive     CEFTRIAXONE <=1 SENSITIVE Sensitive     CIPROFLOXACIN <=0.25 SENSITIVE Sensitive     GENTAMICIN <=1 SENSITIVE Sensitive     IMIPENEM 1 SENSITIVE Sensitive     TRIMETH/SULFA <=20 SENSITIVE Sensitive     AMPICILLIN/SULBACTAM <=2 SENSITIVE Sensitive     PIP/TAZO <=4 SENSITIVE Sensitive     * PROTEUS MIRABILIS  Blood Culture ID Panel (Reflexed)     Status: Abnormal   Collection Time: 04/23/18  5:42 PM  Result Value Ref Range Status   Enterococcus species NOT DETECTED NOT DETECTED Final   Listeria monocytogenes NOT DETECTED NOT DETECTED Final   Staphylococcus species NOT DETECTED NOT DETECTED Final   Staphylococcus aureus NOT DETECTED NOT  DETECTED Final   Streptococcus species NOT DETECTED NOT DETECTED Final   Streptococcus agalactiae NOT DETECTED NOT DETECTED Final   Streptococcus pneumoniae NOT DETECTED NOT DETECTED Final   Streptococcus pyogenes NOT DETECTED NOT DETECTED Final   Acinetobacter baumannii NOT DETECTED NOT DETECTED Final   Enterobacteriaceae species DETECTED (A) NOT DETECTED Final    Comment: Enterobacteriaceae represent a large family of gram-negative bacteria, not a single organism. CRITICAL RESULT CALLED TO, READ BACK BY AND VERIFIED WITH: NITA JOHNSTON PHRAMD AT [facility] AT 5956 ON [id number] BY SJW    Enterobacter cloacae complex NOT DETECTED NOT DETECTED Final   Escherichia coli NOT DETECTED  NOT DETECTED Final   Klebsiella oxytoca NOT DETECTED NOT DETECTED Final   Klebsiella pneumoniae NOT DETECTED NOT DETECTED Final   Proteus species DETECTED (A) NOT DETECTED Final    Comment: CRITICAL RESULT CALLED TO, READ BACK BY AND VERIFIED WITH: NICK HAYES PHARMD AT 1245 ON [id number] BY SJW    Serratia marcescens NOT DETECTED NOT DETECTED Final   Carbapenem resistance NOT DETECTED NOT DETECTED Final   Haemophilus influenzae NOT DETECTED NOT DETECTED Final   Neisseria meningitidis NOT DETECTED NOT DETECTED Final   Pseudomonas aeruginosa NOT DETECTED NOT DETECTED Final   Candida albicans NOT DETECTED NOT DETECTED Final   Candida glabrata NOT DETECTED NOT DETECTED Final   Candida krusei NOT DETECTED NOT DETECTED Final   Candida parapsilosis NOT DETECTED NOT DETECTED Final   Candida tropicalis NOT DETECTED NOT DETECTED Final    Comment: Performed at [facility], [address] [place], [place] [address]  MRSA PCR Screening     Status: None   Collection Time: 04/23/18  8:06 PM  Result Value Ref Range Status   MRSA by PCR NEGATIVE NEGATIVE Final    Comment:        The GeneXpert MRSA Assay (FDA approved for NASAL specimens only), is one component of a comprehensive MRSA colonization surveillance program. It is  not intended to diagnose MRSA infection nor to guide or monitor treatment for MRSA infections. Performed at [facility], [address], [place], [place] [address]   Culture, blood (routine x 2)     Status: None   Collection Time: 04/25/18  7:46 AM  Result Value Ref Range Status   Specimen Description BLOOD RIGHT HAND  Final   Special Requests   Final    BOTTLES DRAWN AEROBIC AND ANAEROBIC Blood Culture adequate volume   Culture   Final    NO GROWTH 5 DAYS Performed at [facility], [place] [address], [place], [place] [address]    Report Status 04/30/2018 FINAL  Final  Culture, blood (routine x 2)     Status: None   Collection Time: 04/25/18  8:00 AM  Result Value Ref Range Status   Specimen Description BLOOD RIGHT ARM  Final   Special Requests   Final    BOTTLES DRAWN AEROBIC AND ANAEROBIC Blood Culture adequate volume   Culture   Final    NO GROWTH 5 DAYS Performed at [facility], [place] [address] [place], [place] [address]    Report Status 04/30/2018 FINAL  Final         Radiology Studies: No results found.      Scheduled Meds: . amantadine  100 mg Oral BID  . calcitonin  4 Units/kg Intramuscular BID  . feeding supplement (ENSURE ENLIVE)  237 mL Oral BID BM  . gabapentin  300 mg Oral TID  . hydrALAZINE  10 mg Intravenous Q6H  . latanoprost  1 drop Both Eyes QHS  . losartan  25 mg Oral Daily   Continuous Infusions: . sodium chloride 100 mL/hr at 05/03/18 0808  . valproate sodium Stopped (05/02/18 2308)   And  . valproate sodium 250 mg (05/03/18 1019)     LOS: 10 days     Cordelia Poche, MD [facility] 05/03/2018, 10:31 AM Pager: [phone]  If 7PM-7AM, please contact night-coverage [email or web address] 05/03/2018, 10:31 AM

## 2018-05-03 NOTE — Progress Notes (Signed)
Received another critical lab value Cal. still 13.9 notified attending.

## 2018-05-03 NOTE — Care Management Note (Signed)
Case Management Note  Patient Details  Name: Henry Smith MRN: [id number] Date of Birth: 1941/11/16  Subjective/Objective:                    Action/Plan: Plan is for patient to return to [facility] when electrolytes improved. CM following.  Expected Discharge Date:                  Expected Discharge Plan:  Skilled Nursing Facility  In-House Referral:  Clinical Social Work  Discharge planning Services     Post Acute Care Choice:    Choice offered to:     DME Arranged:    DME Agency:     HH Arranged:    [place] Agency:     Status of Service:  In process, will continue to follow  If discussed at Long Length of Stay Meetings, dates discussed:    Additional Comments:  Pollie Friar, RN 05/03/2018, 11:50 AM

## 2018-05-03 NOTE — Progress Notes (Signed)
  Speech Language Pathology Treatment: Dysphagia  Patient Details Name: Henry Smith MRN: [id number] DOB: [birth year] Today's Date: 05/03/2018 Time: 9357-0177 SLP Time Calculation (min) (ACUTE ONLY): 18 min  Assessment / Plan / Recommendation Clinical Impression  Skilled treatment session focused on dysphagia goals. SLP facilitated session by providing skilled observation of pt consuming dysphagia 3 and dysphagia 2 trials. Pt with decreased mastication and lingual manipulation with dysphagia 3 trial leading to significantly prolonged oral phase > 2 minutes. However pt with increased effective mastication and lingual manipulation of dysphagia 2 textures, resulting in decreased oral prep time and complete oral clearing. Recommend upgrading diet to dysphagia 2, continue thin liquids and medicine whole with puree. Of note, pt with increased verbalization, eye contact and sustained attention. Information regarding diet upgrade was posted at head of bed and nursing made aware.    HPI HPI: Pt is a 77 y.o. male admitted 04/23/18 with AMS, including non-verbal, not walking and R-side facial droop. Worked up for encephalopathy secondary to sepsis due to UTI, AKI. Head CT and MRI show no acute intracranial finding; mild small vessel change normal for age. PMH includes dementia, schizophrenia, prostate CA      SLP Plan  Continue with current plan of care       Recommendations  Diet recommendations: Dysphagia 2 (fine chop);Thin liquid Liquids provided via: Cup;Straw Medication Administration: Whole meds with puree Supervision: Full supervision/cueing for compensatory strategies Compensations: Follow solids with liquid;Minimize environmental distractions Postural Changes and/or Swallow Maneuvers: Seated upright 90 degrees                Oral Care Recommendations: Oral care QID Follow up Recommendations: Skilled Nursing facility SLP Visit Diagnosis: Dysphagia, oropharyngeal phase  (R13.12) Plan: Continue with current plan of care       GO                Lennie Dunnigan 05/03/2018, 3:17 PM

## 2018-05-03 NOTE — Progress Notes (Signed)
Physical Therapy Treatment Patient Details Name: Henry Smith MRN: [id number] DOB: [birth year] Today's Date: 05/03/2018    History of Present Illness Pt is a 77 y.o. male admitted 04/23/18 with AMS, including non-verbal, not walking and R-side facial droop. Worked up for encephalopathy secondary to sepsis due to UTI, AKI. Head CT and MRI show no acute intracranial finding; mild small vessel change normal for age. PMH includes dementia, schizophrenia, prostate CA.    PT Comments    Patient is making gradual progress toward PT goals. Pt was more communicative than previous session and able to squat pivot OOB to chair with +2 assist. Continue to progress as tolerated with anticipated d/c to SNF for further skilled PT services.     Follow Up Recommendations  SNF;Supervision/Assistance - 24 hour     Equipment Recommendations  (TBD)    Recommendations for Other Services       Precautions / Restrictions Precautions Precautions: Fall Restrictions Weight Bearing Restrictions: No    Mobility  Bed Mobility Overal bed mobility: Needs Assistance Bed Mobility: Rolling;Sidelying to Sit Rolling: +2 for physical assistance;Max assist Sidelying to sit: Max assist;+2 for physical assistance       General bed mobility comments: assist required at bilat LE and trunk; pt required cues for sequencing and hand over hand assist to reach toward L side   Transfers Overall transfer level: Needs assistance Equipment used: (+2 face to face with gait belt and bed pad) Transfers: Squat Pivot Transfers     Squat pivot transfers: Max assist;+2 physical assistance     General transfer comment: cues for sequencing; assist to power up and pt unable to ahcieve full standing; assistance to pivot L foot and to guide hips to recliner; knees blocked  Ambulation/Gait                 Stairs             Wheelchair Mobility    Modified Rankin (Stroke Patients Only)       Balance  Overall balance assessment: Needs assistance Sitting-balance support: Bilateral upper extremity supported;Feet supported(external support) Sitting balance-Leahy Scale: Poor                                      Cognition Arousal/Alertness: Awake/alert Behavior During Therapy: Flat affect Overall Cognitive Status: No family/caregiver present to determine baseline cognitive functioning Area of Impairment: Attention;Following commands;Awareness;Safety/judgement;Problem solving                   Current Attention Level: Focused   Following Commands: Follows one step commands inconsistently;Follows one step commands with increased time Safety/Judgement: Decreased awareness of safety Awareness: Intellectual Problem Solving: Slow processing;Decreased initiation;Requires verbal cues        Exercises      General Comments        Pertinent Vitals/Pain Pain Assessment: Faces Faces Pain Scale: No hurt    Home Living                      Prior Function            PT Goals (current goals can now be found in the care plan section) Acute Rehab PT Goals Patient Stated Goal: Unstated PT Goal Formulation: Patient unable to participate in goal setting Time For Goal Achievement: 05/08/18 Potential to Achieve Goals: Fair Progress towards PT goals: Progressing toward goals    Frequency  Min 2X/week      PT Plan Current plan remains appropriate    Co-evaluation PT/OT/SLP Co-Evaluation/Treatment: Yes Reason for Co-Treatment: Necessary to address cognition/behavior during functional activity;Complexity of the patient's impairments (multi-system involvement);For patient/therapist safety;To address functional/ADL transfers PT goals addressed during session: Mobility/safety with mobility        AM-PAC PT "6 Clicks" Daily Activity  Outcome Measure  Difficulty turning over in bed (including adjusting bedclothes, sheets and blankets)?:  Unable Difficulty moving from lying on back to sitting on the side of the bed? : Unable Difficulty sitting down on and standing up from a chair with arms (e.g., wheelchair, bedside commode, etc,.)?: Unable Help needed moving to and from a bed to chair (including a wheelchair)?: A Lot Help needed walking in hospital room?: Total Help needed climbing 3-5 steps with a railing? : Total 6 Click Score: 7    End of Session Equipment Utilized During Treatment: Gait belt Activity Tolerance: Patient tolerated treatment well Patient left: with call bell/phone within reach;in chair;with chair alarm set Nurse Communication: Mobility status;Need for lift equipment PT Visit Diagnosis: Other abnormalities of gait and mobility (R26.89);Other symptoms and signs involving the nervous system (R29.898)     Time: 2878-6767 PT Time Calculation (min) (ACUTE ONLY): 25 min  Charges:  $Therapeutic Activity: 8-22 mins                    G Codes:       Earney Navy, PTA Pager: [phone]     Darliss Cheney 05/03/2018, 3:16 PM

## 2018-05-04 DIAGNOSIS — E870 Hyperosmolality and hypernatremia: Secondary | ICD-10-CM

## 2018-05-04 LAB — BASIC METABOLIC PANEL
ANION GAP: 6 (ref 5–15)
Anion gap: 5 (ref 5–15)
BUN: 18 mg/dL (ref 6–20)
BUN: 16 mg/dL (ref 6–20)
CALCIUM: 13.3 mg/dL — AB (ref 8.9–10.3)
CHLORIDE: 120 mmol/L — AB (ref 101–111)
CO2: 28 mmol/L (ref 22–32)
CO2: 27 mmol/L (ref 22–32)
Calcium: 12.8 mg/dL — ABNORMAL HIGH (ref 8.9–10.3)
Chloride: 123 mmol/L — ABNORMAL HIGH (ref 101–111)
Creatinine, Ser: 0.84 mg/dL (ref 0.61–1.24)
Creatinine, Ser: 0.9 mg/dL (ref 0.61–1.24)
GFR calc Af Amer: >60 mL/min (ref >60)
GFR calc Af Amer: >60 mL/min (ref >60)
GFR calc non Af Amer: >60 mL/min (ref >60)
GLUCOSE: 81 mg/dL (ref 65–99)
GLUCOSE: 122 mg/dL — AB (ref 65–99)
POTASSIUM: 3.7 mmol/L (ref 3.5–5.1)
Potassium: 4 mmol/L (ref 3.5–5.1)
Sodium: 157 mmol/L — ABNORMAL HIGH (ref 135–145)
Sodium: 152 mmol/L — ABNORMAL HIGH (ref 135–145)

## 2018-05-04 LAB — PARATHYROID HORMONE, INTACT (NO CA): PTH: 191 pg/mL — AB (ref 15–65)

## 2018-05-04 LAB — GLUCOSE, CAPILLARY
GLUCOSE-CAPILLARY: 71 mg/dL (ref 65–99)
GLUCOSE-CAPILLARY: 83 mg/dL (ref 65–99)
Glucose-Capillary: 77 mg/dL (ref 65–99)

## 2018-05-04 MED ORDER — DEXTROSE 5 % IV SOLN
INTRAVENOUS | Status: DC
  Administered 2018-05-04 – 2018-05-10 (×6): via INTRAVENOUS

## 2018-05-04 NOTE — Progress Notes (Signed)
  Speech Language Pathology Treatment: Dysphagia  Patient Details Name: Henry Smith MRN: [id number] DOB: 1941-11-03 Today's Date: 05/04/2018 Time: 1007-1020 SLP Time Calculation (min) (ACUTE ONLY): 13 min  Assessment / Plan / Recommendation Clinical Impression  Skilled treatment session focused on dysphagia goals. SLP facilitated session by providing skilled observation of pt consuming snack of dysphagia 2 (graham crackers with pudding) and thin liquids via straw. Pt continues to be very motivated to participate. Pt with effectively mastication and lingual manipulation of bolus but continues to be mildly increased. Pt requires liquid wash to effectively clear all particles. Continue to recommend dysphagia 2 with thin liquids d/t increased oral prep time.   HPI HPI: Pt is a 77 y.o. male admitted 04/23/18 with AMS, including non-verbal, not walking and R-side facial droop. Worked up for encephalopathy secondary to sepsis due to UTI, AKI. Head CT and MRI show no acute intracranial finding; mild small vessel change normal for age. PMH includes dementia, schizophrenia, prostate CA      SLP Plan  Continue with current plan of care       Recommendations  Diet recommendations: Dysphagia 2 (fine chop);Thin liquid Liquids provided via: Cup;Straw Medication Administration: Whole meds with puree Supervision: Full supervision/cueing for compensatory strategies Compensations: Follow solids with liquid;Minimize environmental distractions Postural Changes and/or Swallow Maneuvers: Seated upright 90 degrees                Oral Care Recommendations: Oral care BID Follow up Recommendations: Skilled Nursing facility SLP Visit Diagnosis: Dysphagia, oropharyngeal phase (R13.12) Plan: Continue with current plan of care       GO                Vicente Weidler 05/04/2018, 11:39 AM

## 2018-05-04 NOTE — Progress Notes (Signed)
PROGRESS NOTE    Henry Smith  PNT:[id number] DOB: Sep 16, 1941 DOA: 04/23/2018 PCP: Henry Renshaw, MD   Brief Narrative: Henry Smith is a 77 y.o. male with a history of schizophrenia, dementia associate with neurosyphilis, prostate cancer, hypertension.  Patient presented secondary to worsening mental status, including becoming nonverbal with facial droop.  Initial concern for stroke, however, MRI was negative.  He was found to have a UTI with associated bacteremia and is currently on antibiotic therapy. Henry Smith stay with noted for worsening toxemia and per natremia, by reviewing his records and discussing with patient primary endocrinologist(Henry Smith in [place]), is known to have primary hyperparathyroidism.   Assessment & Plan:   Principal Problem:   Altered mental status, unspecified Active Problems:   Schizoaffective disorder, bipolar type (HCC)   Sepsis secondary to UTI [facility])   UTI (urinary tract infection)   Acute metabolic encephalopathy   Proteus mirabilis infection   Bacteremia   Sepsis due to Gram negative bacteria (HCC)   Sepsis/Bacteremia/UTI secondary to Proteus Mirabilis - Sensitive to ceftriaxone.  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  Patient is far from baseline.  -  MRI was negative for acute finding that could explain his mental status change. -  EEG without evidence of seizure activity.  -Was improving, but currently has worsening mental status, this is most likely in the setting of hypercalcemia and hyponatremia   Hyperparathyroidism/hypercalcemia -Reviewing his records on care everywhere, apparently he is following up with chronology, Henry. Gabriel Smith, I have discussed with her, he is with known primary hyperparathyroidism, ultrasound revealed what looks like adenoma in the left superior thyroid area most  likely the adenoma, -Hypercalcemia with calcium 13.3 today despite being on calcitonin, did receive Zometa yesterday continue with IV fluid. -I will obtain sestamibi scan and discussed with Henry. Lynford Smith from surgery parathyroidectomy.  Hypernatremia - Worsened with change in mental status. Looking more like DI possibly nephrogenic secondary to hypercalcemia, urine sodium is 68, encourage fluid intake, I have switched to 5W, will monitor BMP every 8 hours -Encourage oral intake   Schizoaffective disorder Psychiatry consulted for evaluation. -Continue Depakote  Dysphasia Unknown etiology.  Possibly related to underlying dementia and schizophrenia vs metabolic encephalopathy from infection. Improving with improvement of encephalopathy -Continue IV fluids -SLP recommendations -Continue to encourage oral intake  Acute kidney injury Resolved.  Essential hypertension Uncontrolled.  -Continue hydralazine 10 mg every 6 hours IV prn -Continue losartan  Thrombocytopenia Chronic. Improved slightly, but otherwise stable.  Hypokalemia Resolved with supplementation   DVT prophylaxis: Lovenox Code Status:   Code Status: Full Code Family Communication: None at bedside. Disposition Plan: Discharge to SNF when medically stable   Consultants:   Neurology   Procedures:   EEG (5/16) IMPRESSION:  This is an abnormal EEG demonstrating a mild to moderate diffuse slowing of electrocerebral activity.  This can be seen in a wide variety of encephalopathic state including those of a toxic, metabolic, or degenerative nature.  There were no focal, hemispheric, or lateralizing features.  No epileptiform activity was recorded.   Antimicrobials:  Levaquin (5/11)  Aztreonam (5/11>>5/12)  Ceftriaxone (5/12>>   Subjective: Patient states no issues with head shake.  Objective: Vitals:   05/03/18 2329 05/04/18 0343 05/04/18 0748 05/04/18 1138  BP: (!) 159/87 (!) 150/94 116/61 133/82    Pulse: (!) 108 (!) 105 (!) 118 (!) 116  Resp: 20 20 20  20  Temp: 98 F (36.7 C) 98 F (36.7 C) (!) 97.5 F (36.4 C) 98 F (36.7 C)  TempSrc: Oral Oral Oral Oral  SpO2: 97% 97% 94% 97%  Weight:      Height:        Intake/Output Summary (Last 24 hours) at 05/04/2018 1253 Last data filed at 05/04/2018 0900 Gross per 24 hour  Intake 2561.66 ml  Output 2750 ml  Net -188.34 ml   Filed Weights   04/23/18 1429 04/23/18 2025 04/24/18 0548  Weight: 90.7 kg (200 lb) 98.2 kg (216 lb 7.9 oz) 98.6 kg (217 lb 6 oz)    Examination:  General exam: The comfortably, calm, wake up and answer questions, slightly confused Respiratory system: Good Entry bilaterally, clear to auscultation  Cardiovascular system: S1 & S2 heard, RRR. No murmurs, rubs, gallops or clicks. Gastrointestinal system: Dominant soft, nondistended, bowel sounds present Central nervous system: [place] today, but he wakes up and answer questions by nodding his head Extremities: No calf tenderness Skin: No cyanosis. No rashes Psychiatry: Judgement and insight appear impaired. Flat affect.    Data Reviewed: I have personally reviewed following labs and imaging studies  CBC: Recent Labs  Lab 04/28/18 0335 04/30/18 0410  WBC 9.0 7.3  HGB 14.5 13.1  HCT 44.5 41.1  MCV 84.6 85.8  PLT 95* 993*   Basic Metabolic Panel: Recent Labs  Lab 05/03/18 0422 05/03/18 0722 05/03/18 1614 05/03/18 1959 05/04/18 0324  NA 155* 155* 155* 156* 157*  K 4.5 4.4 3.7 3.8 4.0  CL 120* 119* 122* 123* 123*  CO2 28 29 29 30 28   GLUCOSE 94 87 93 130* 81  BUN 20 20 18 18 18   CREATININE 1.07 1.08 0.96 0.94 0.84  CALCIUM 13.9* 13.9* 13.6* 13.5* 13.3*   GFR: Estimated Creatinine Clearance: 92.3 mL/min (by C-G formula based on SCr of 0.84 mg/dL). Liver Function Tests: Recent Labs  Lab 05/03/18 1149  ALBUMIN 2.3*   No results for input(s): LIPASE, AMYLASE in the last 168 hours. No results for input(s): AMMONIA in the last 168  hours. Coagulation Profile: No results for input(s): INR, PROTIME in the last 168 hours. Cardiac Enzymes: No results for input(s): CKTOTAL, CKMB, CKMBINDEX, TROPONINI in the last 168 hours. BNP (last 3 results) No results for input(s): PROBNP in the last 8760 hours. HbA1C: No results for input(s): HGBA1C in the last 72 hours. CBG: Recent Labs  Lab 05/03/18 1138 05/03/18 1617 05/03/18 2116 05/04/18 0613 05/04/18 1132  GLUCAP 104* 93 107* 71 83   Lipid Profile: No results for input(s): CHOL, HDL, LDLCALC, TRIG, CHOLHDL, LDLDIRECT in the last 72 hours. Thyroid Function Tests: No results for input(s): TSH, T4TOTAL, FREET4, T3FREE, THYROIDAB in the last 72 hours. Anemia Panel: No results for input(s): VITAMINB12, FOLATE, FERRITIN, TIBC, IRON, RETICCTPCT in the last 72 hours. Sepsis Labs: No results for input(s): PROCALCITON, LATICACIDVEN in the last 168 hours.  Recent Results (from the past 240 hour(s))  Culture, blood (routine x 2)     Status: None   Collection Time: 04/25/18  7:46 AM  Result Value Ref Range Status   Specimen Description BLOOD RIGHT HAND  Final   Special Requests   Final    BOTTLES DRAWN AEROBIC AND ANAEROBIC Blood Culture adequate volume   Culture   Final    NO GROWTH 5 DAYS Performed at [facility], [address], [place], [place] [address]    Report Status 04/30/2018 FINAL  Final  Culture, blood (  routine x 2)     Status: None   Collection Time: 04/25/18  8:00 AM  Result Value Ref Range Status   Specimen Description BLOOD RIGHT ARM  Final   Special Requests   Final    BOTTLES DRAWN AEROBIC AND ANAEROBIC Blood Culture adequate volume   Culture   Final    NO GROWTH 5 DAYS Performed at [facility], [address] [place], [place] [address]    Report Status 04/30/2018 FINAL  Final         Radiology Studies: No results found.      Scheduled Meds: . amantadine  100 mg Oral BID  . calcitonin  4 Units/kg Intramuscular BID  .  feeding supplement (ENSURE ENLIVE)  237 mL Oral BID BM  . gabapentin  300 mg Oral TID  . hydrALAZINE  10 mg Intravenous Q6H  . latanoprost  1 drop Both Eyes QHS  . losartan  25 mg Oral Daily   Continuous Infusions: . dextrose 50 mL/hr at 05/04/18 0919  . valproate sodium Stopped (05/03/18 2315)   And  . valproate sodium 250 mg (05/04/18 1056)     LOS: 11 days     Phillips Climes, MD Pager [phone] [facility] 05/04/2018, 12:53 PM Pager: [phone]  If 7PM-7AM, please contact night-coverage [email or web address] 05/04/2018, 12:53 PM

## 2018-05-05 ENCOUNTER — Ambulatory Visit (HOSPITAL_COMMUNITY): Payer: Medicare Other

## 2018-05-05 ENCOUNTER — Inpatient Hospital Stay (HOSPITAL_COMMUNITY): Payer: Medicare Other

## 2018-05-05 LAB — BASIC METABOLIC PANEL
ANION GAP: 3 — AB (ref 5–15)
ANION GAP: 5 (ref 5–15)
ANION GAP: 3 — AB (ref 5–15)
BUN: 15 mg/dL (ref 6–20)
BUN: 15 mg/dL (ref 6–20)
BUN: 16 mg/dL (ref 6–20)
CALCIUM: 12.1 mg/dL — AB (ref 8.9–10.3)
CALCIUM: 12.1 mg/dL — AB (ref 8.9–10.3)
CHLORIDE: 121 mmol/L — AB (ref 101–111)
CO2: 26 mmol/L (ref 22–32)
CO2: 25 mmol/L (ref 22–32)
CO2: 26 mmol/L (ref 22–32)
CREATININE: 0.83 mg/dL (ref 0.61–1.24)
Calcium: 12.2 mg/dL — ABNORMAL HIGH (ref 8.9–10.3)
Chloride: 119 mmol/L — ABNORMAL HIGH (ref 101–111)
Chloride: 118 mmol/L — ABNORMAL HIGH (ref 101–111)
Creatinine, Ser: 0.85 mg/dL (ref 0.61–1.24)
Creatinine, Ser: 0.92 mg/dL (ref 0.61–1.24)
GFR calc non Af Amer: >60 mL/min (ref >60)
GFR calc non Af Amer: >60 mL/min (ref >60)
Glucose, Bld: 99 mg/dL (ref 65–99)
Glucose, Bld: 96 mg/dL (ref 65–99)
Glucose, Bld: 122 mg/dL — ABNORMAL HIGH (ref 65–99)
Potassium: 3.9 mmol/L (ref 3.5–5.1)
Potassium: 4 mmol/L (ref 3.5–5.1)
Potassium: 3.7 mmol/L (ref 3.5–5.1)
Sodium: 150 mmol/L — ABNORMAL HIGH (ref 135–145)
Sodium: 149 mmol/L — ABNORMAL HIGH (ref 135–145)
Sodium: 147 mmol/L — ABNORMAL HIGH (ref 135–145)

## 2018-05-05 LAB — CBC
HCT: 46.2 % (ref 39.0–52.0)
Hemoglobin: 14.2 g/dL (ref 13.0–17.0)
MCH: 27.5 pg (ref 26.0–34.0)
MCHC: 30.7 g/dL (ref 30.0–36.0)
MCV: 89.4 fL (ref 78.0–100.0)
PLATELETS: 172 10*3/uL (ref 150–400)
RBC: 5.17 MIL/uL (ref 4.22–5.81)
RDW: 17.1 % — ABNORMAL HIGH (ref 11.5–15.5)
WBC: 10.5 10*3/uL (ref 4.0–10.5)

## 2018-05-05 LAB — GLUCOSE, CAPILLARY
GLUCOSE-CAPILLARY: 103 mg/dL — AB (ref 65–99)
Glucose-Capillary: 93 mg/dL (ref 65–99)
Glucose-Capillary: 113 mg/dL — ABNORMAL HIGH (ref 65–99)
Glucose-Capillary: 119 mg/dL — ABNORMAL HIGH (ref 65–99)

## 2018-05-05 LAB — VITAMIN D 25 HYDROXY (VIT D DEFICIENCY, FRACTURES): Vit D, 25-Hydroxy: 48.7 ng/mL (ref 30.0–100.0)

## 2018-05-05 MED ORDER — TECHNETIUM TC 99M SESTAMIBI - CARDIOLITE
25.2400 | Freq: Once | INTRAVENOUS | Status: AC | PRN
  Administered 2018-05-05: 11:00:00 25.24 via INTRAVENOUS

## 2018-05-05 MED ORDER — ENSURE ENLIVE PO LIQD
237.0000 mL | Freq: Three times a day (TID) | ORAL | Status: DC
  Administered 2018-05-05 – 2018-05-12 (×21): 237 mL via ORAL

## 2018-05-05 MED ORDER — BISACODYL 10 MG RE SUPP
10.0000 mg | Freq: Two times a day (BID) | RECTAL | Status: DC | PRN
Start: 2018-05-05 — End: 2018-05-13
  Administered 2018-05-11: 10 mg via RECTAL
  Filled 2018-05-05: qty 1

## 2018-05-05 NOTE — Progress Notes (Signed)
PROGRESS NOTE    Henry Smith  SAY:[id number] DOB: 12/07/1941 DOA: 04/23/2018 PCP: Caprice Renshaw, MD   Brief Narrative:  Henry Smith is a 77 y.o. male with a history of schizophrenia, bipolar disorder, dementia associate with neurosyphilis, prostate cancer, hypertension.  Patient presented secondary to worsening mental status, including becoming nonverbal with facial droop.  Initial concern for stroke, however, MRI was negative.  He was found to have a UTI with associated bacteremia and is currently on antibiotic therapy. [facility] stay with noted for worsening toxemia and per natremia, by reviewing his records and discussing with patient primary endocrinologist(Dr Solum in [place]), is known to have primary hyperparathyroidism.   Assessment & Plan:   Principal Problem:   Altered mental status, unspecified Active Problems:   Schizoaffective disorder, bipolar type (HCC)   Sepsis secondary to UTI [facility])   UTI (urinary tract infection)   Acute metabolic encephalopathy   Proteus mirabilis infection   Bacteremia   Sepsis due to Gram negative bacteria (HCC)   Sepsis/Bacteremia/UTI secondary to Proteus Mirabilis - Sensitive to ceftriaxone.  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 -  MRI was negative for acute finding that could explain his mental status change. -  EEG without evidence of seizure activity.  -Should he was improving, but constant but back to worsening mental status, most likely in the setting of hyper calcium Mia and hyper natremia , have discussed with his legal guardian, apparently he is talkative, ambulate and was able to feed himself .  -I will DC his psych medication including amantadine, Depakote and gabapentin to see if this improves his mentation  Hyperparathyroidism/hypercalcemia -Being followed by  endocrinology Dr. Gabriel Carina and [place], ultrasound as an outpatient does showing left superior adenoma most likely the cause of his primary hyperparathyroidism, apparently his PTH level steadily increasing , calcium peaked at 13.9, he did require calcitonin and Zometa, and has improved to 12.2 today . -Have discussed with Dr. Gabriel Carina, think at this point he needs parathyroidectomy, I have consulted Dr. Lynford Humphrey , sestamibi scan is pending .   Hypernatremia - Worsened with change in mental status.  Most likely neurogenic TIA in the setting of hypercalcemia , as well decreased oral intake , improving on D5W, I have increased 100 cc today, will continue to monitor BMP every 8 hours . -Encourage oral intake   Schizoaffective disorder Psychiatry consulted for evaluation. -His encephalopathy and lethargy, I will DC his meds  Dysphasia - Possibly related to underlying dementia and schizophrenia vs metabolic encephalopathy from infection. -Continue IV fluids -followed by SLP -Continue to encourage oral intake  Acute kidney injury - Resolved.  Essential hypertension -Labile, overall acceptable, with losartan  Thrombocytopenia Chronic. Improved slightly, but otherwise stable.  Hypokalemia Resolved with supplementation   DVT prophylaxis: Lovenox Code Status:   Code Status: Full Code Family Communication: None at bedside. Disposition Plan: Discharge to SNF when medically stable   Consultants:   Neurology   Procedures:   EEG (5/16) IMPRESSION:  This is an abnormal EEG demonstrating a mild to moderate diffuse slowing of electrocerebral activity.  This can be seen in a wide variety of encephalopathic state including those of a toxic, metabolic, or degenerative nature.  There were no focal, hemispheric, or lateralizing features.  No epileptiform activity was recorded.   Antimicrobials:  Levaquin (5/11)  Aztreonam (5/11>>5/12)  Ceftriaxone (5/12>>   Subjective: Patient  states no  issues with head shake.  Objective: Vitals:   05/04/18 2339 05/05/18 0417 05/05/18 0735 05/05/18 1300  BP: (!) 147/97 135/79 121/85 (!) 147/83  Pulse: 93 (!) 107 98 (!) 102  Resp: 20 18 18 18   Temp: 97.9 F (36.6 C) (!) 97.4 F (36.3 C) 97.8 F (36.6 C) 98.2 F (36.8 C)  TempSrc:  Oral Axillary Axillary  SpO2: 97% 100% 100% 100%  Weight:      Height:        Intake/Output Summary (Last 24 hours) at 05/05/2018 1456 Last data filed at 05/05/2018 1300 Gross per 24 hour  Intake 3600.42 ml  Output 3200 ml  Net 400.42 ml   Filed Weights   04/23/18 1429 04/23/18 2025 04/24/18 0548  Weight: 90.7 kg (200 lb) 98.2 kg (216 lb 7.9 oz) 98.6 kg (217 lb 6 oz)    Examination:  Sleepy, but wakes up at open his eyes, confused Supple Neck,No JVD,  Symmetrical Chest wall movement, Good air movement bilaterally, CTAB RRR,No Gallops,Rubs or new Murmurs, No Parasternal Heave +ve B.Sounds, Abd Soft, No tenderness, , No rebound - guarding or rigidity. No Cyanosis, Clubbing or edema, No new Rash or bruise   Judgement and insight appear impaired. Flat affect.    Data Reviewed: I have personally reviewed following labs and imaging studies  CBC: Recent Labs  Lab 04/30/18 0410 05/05/18 0237  WBC 7.3 10.5  HGB 13.1 14.2  HCT 41.1 46.2  MCV 85.8 89.4  PLT 133* 846   Basic Metabolic Panel: Recent Labs  Lab 05/03/18 1614 05/03/18 1959 05/04/18 0324 05/04/18 1756 05/05/18 0237  NA 155* 156* 157* 152* 150*  K 3.7 3.8 4.0 3.7 3.9  CL 122* 123* 123* 120* 121*  CO2 29 30 28 27 26   GLUCOSE 93 130* 81 122* 99  BUN 18 18 18 16 15   CREATININE 0.96 0.94 0.84 0.90 0.83  CALCIUM 13.6* 13.5* 13.3* 12.8* 12.2*   GFR: Estimated Creatinine Clearance: 93.4 mL/min (by C-G formula based on SCr of 0.83 mg/dL). Liver Function Tests: Recent Labs  Lab 05/03/18 1149  ALBUMIN 2.3*   No results for input(s): LIPASE, AMYLASE in the last 168 hours. No results for input(s): AMMONIA in the  last 168 hours. Coagulation Profile: No results for input(s): INR, PROTIME in the last 168 hours. Cardiac Enzymes: No results for input(s): CKTOTAL, CKMB, CKMBINDEX, TROPONINI in the last 168 hours. BNP (last 3 results) No results for input(s): PROBNP in the last 8760 hours. HbA1C: No results for input(s): HGBA1C in the last 72 hours. CBG: Recent Labs  Lab 05/04/18 0613 05/04/18 1132 05/04/18 1651 05/05/18 0627 05/05/18 1100  GLUCAP 71 83 77 103* 93   Lipid Profile: No results for input(s): CHOL, HDL, LDLCALC, TRIG, CHOLHDL, LDLDIRECT in the last 72 hours. Thyroid Function Tests: No results for input(s): TSH, T4TOTAL, FREET4, T3FREE, THYROIDAB in the last 72 hours. Anemia Panel: No results for input(s): VITAMINB12, FOLATE, FERRITIN, TIBC, IRON, RETICCTPCT in the last 72 hours. Sepsis Labs: No results for input(s): PROCALCITON, LATICACIDVEN in the last 168 hours.  No results found for this or any previous visit (from the past 240 hour(s)).       Radiology Studies: No results found.      Scheduled Meds: . amantadine  100 mg Oral BID  . feeding supplement (ENSURE ENLIVE)  237 mL Oral BID BM  . gabapentin  300 mg Oral TID  . hydrALAZINE  10 mg Intravenous Q6H  . latanoprost  1 drop Both  Eyes QHS  . losartan  25 mg Oral Daily   Continuous Infusions: . dextrose 100 mL/hr at 05/05/18 1053  . valproate sodium Stopped (05/04/18 2229)   And  . valproate sodium Stopped (05/05/18 1200)     LOS: 12 days     Phillips Climes, MD Pager [phone] [facility] 05/05/2018, 2:56 PM Pager: [phone]  If 7PM-7AM, please contact night-coverage [email or web address] 05/05/2018, 2:56 PM
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
## 2018-05-05 NOTE — Progress Notes (Signed)
Nutrition Follow-up  DOCUMENTATION CODES:   Obesity unspecified  INTERVENTION:  Increase Ensure Enlive to po TID, each supplement provides 350 kcal and 20 grams of protein  Magic cup TID with meals, each supplement provides 290 kcal and 9 grams of protein  Continue MVI liquid  NUTRITION DIAGNOSIS:   Increased nutrient needs related to acute illness as evidenced by estimated needs. -ongoing  GOAL:   Patient will meet greater than or equal to 90% of their needs -progressing  MONITOR:   Supplement acceptance, PO intake, Weight trends  REASON FOR ASSESSMENT:   Other (Comment)(Consult for Cortrak)    ASSESSMENT:   77 y.o. male, with history of schizophrenia, dementia associated with neurosyphilis, prostate cancer was brought to hospital with altered mental status. Patient was initially nonverbal. Patient has been nonverbal drooping, right-sided facial droop not able to walk as per nursing home staff thought that has "schizophrenia might be acting up". Patient was sent to ED for further evaluation. Patient was found to have UTI, lactic acidosis, creatinine 1.5, admitted for further evaluation.  Overnight was noted to have motor aphasia with worsening right-sided hemiparesis, patient was transferred to [facility] for MRI brain to rule out CVA.  Patient in nuclear med for sestamibi scan during RD visit. Sepsis resolved, AMS worsening, may need parathyroidectomy per MD. Discussed patient's PO intake with RN, he is not eating much food, refuses, but is drinking well. Exhibits some lethargy from time to time. Also likes ice cream. Increased ensure to TID, ordered magic cups. Monitor intake.  Labs reviewed:  CBGs 77, 103, 93, 113  Medications reviewed and include:  D5 at 134mL/hr --> 408 calories Na 149  Diet Order:   Diet Order           DIET DYS 2 Room service appropriate? Yes; Fluid consistency: Thin  Diet effective now          EDUCATION NEEDS:   No education needs  have been identified at this time  Skin:  Skin Assessment: Reviewed RN Assessment  Last BM:  04/29/2018  Height:   Ht Readings from Last 1 Encounters:  04/23/18 5\' 11"  (1.803 m)    Weight:   Wt Readings from Last 1 Encounters:  04/24/18 217 lb 6 oz (98.6 kg)    Ideal Body Weight:  78.18 kg  BMI:  Body mass index is 30.32 kg/m.  Estimated Nutritional Needs:   Kcal:  1775-1970 (18-20 kcal/kg)  Protein:  110-120 grams  Fluid:  >/= 2 L/day  Satira Anis. Trentan Trippe, MS, RD LDN Inpatient Clinical Dietitian Pager [phone]

## 2018-05-06 LAB — CBC
HCT: 42.6 % (ref 39.0–52.0)
Hemoglobin: 13.5 g/dL (ref 13.0–17.0)
MCH: 27.6 pg (ref 26.0–34.0)
MCHC: 31.7 g/dL (ref 30.0–36.0)
MCV: 87.1 fL (ref 78.0–100.0)
PLATELETS: 154 10*3/uL (ref 150–400)
RBC: 4.89 MIL/uL (ref 4.22–5.81)
RDW: 16.4 % — AB (ref 11.5–15.5)
WBC: 9.8 10*3/uL (ref 4.0–10.5)

## 2018-05-06 LAB — BASIC METABOLIC PANEL
Anion gap: 5 (ref 5–15)
Anion gap: 6 (ref 5–15)
BUN: 15 mg/dL (ref 6–20)
BUN: 17 mg/dL (ref 6–20)
CALCIUM: 11.9 mg/dL — AB (ref 8.9–10.3)
CHLORIDE: 113 mmol/L — AB (ref 101–111)
CO2: 27 mmol/L (ref 22–32)
CO2: 25 mmol/L (ref 22–32)
CREATININE: 0.85 mg/dL (ref 0.61–1.24)
Calcium: 11.5 mg/dL — ABNORMAL HIGH (ref 8.9–10.3)
Chloride: 116 mmol/L — ABNORMAL HIGH (ref 101–111)
Creatinine, Ser: 0.91 mg/dL (ref 0.61–1.24)
GFR calc Af Amer: >60 mL/min (ref >60)
GFR calc non Af Amer: >60 mL/min (ref >60)
GLUCOSE: 101 mg/dL — AB (ref 65–99)
Glucose, Bld: 91 mg/dL (ref 65–99)
POTASSIUM: 3.4 mmol/L — AB (ref 3.5–5.1)
Potassium: 3.6 mmol/L (ref 3.5–5.1)
SODIUM: 148 mmol/L — AB (ref 135–145)
SODIUM: 144 mmol/L (ref 135–145)

## 2018-05-06 LAB — GLUCOSE, CAPILLARY
GLUCOSE-CAPILLARY: 86 mg/dL (ref 65–99)
Glucose-Capillary: 83 mg/dL (ref 65–99)
Glucose-Capillary: 106 mg/dL — ABNORMAL HIGH (ref 65–99)
Glucose-Capillary: 90 mg/dL (ref 65–99)

## 2018-05-06 NOTE — Progress Notes (Signed)
    CC:  Altered Mental status  Subjective: Stable Na improving more conversant than yesterday.  Thinks he lives in [place].    Objective: Vital signs in last 24 hours: Temp:  [97.5 F (36.4 C)-98.5 F (36.9 C)] 97.5 F (36.4 C) (05/24 0004) Pulse Rate:  [94-104] 94 (05/24 0505) Resp:  [18-20] 20 (05/24 0505) BP: (120-147)/(69-95) 132/84 (05/24 0505) SpO2:  [96 %-100 %] 98 % (05/24 0505) Last BM Date: 05/01/18 620 PO 2100 IV 1600 urine  No BM recorded Afebrile, sl tachycardic, BP up at times Na improving WBC normal Abnormal retained sestamibi at the upper pole of the LEFT thyroid lobe consistent with a LEFT superior parathyroid adenoma 05/05/18  BA swallow not read and results pending  Intake/Output from previous day: 05/23 0701 - 05/24 0700 In: 2697.9 [P.O.:620; I.V.:2077.9] Out: 1600 [Urine:1600] Intake/Output this shift: No intake/output data recorded.  General appearance: alert, cooperative and no distress Neck: no adenopathy, no carotid bruit, no JVD, supple, symmetrical, trachea midline and thyroid not enlarged, symmetric, no tenderness/mass/nodules  Lab Results:  Recent Labs    05/05/18 0237 05/06/18 0411  WBC 10.5 9.8  HGB 14.2 13.5  HCT 46.2 42.6  PLT 172 154    BMET Recent Labs    05/05/18 2031 05/06/18 0411  NA 147* 148*  K 3.7 3.6  CL 118* 116*  CO2 26 27  GLUCOSE 122* 101*  BUN 16 15  CREATININE 0.92 0.85  CALCIUM 12.1* 11.9*   PT/INR No results for input(s): LABPROT, INR in the last 72 hours.  Recent Labs  Lab 05/03/18 1149  ALBUMIN 2.3*     Lipase     Component Value Date/Time   LIPASE 22 05/22/2017 1153     Medications: . feeding supplement (ENSURE ENLIVE)  237 mL Oral TID BM  . hydrALAZINE  10 mg Intravenous Q6H  . latanoprost  1 drop Both Eyes QHS  . losartan  25 mg Oral Daily   . dextrose 100 mL/hr at 05/06/18 0219    Assessment/Plan Sepsis with Proteus bacteremia/UTI Hypernatremia Acute metabolic  encephalopathy Dementia associate with neurosyphilis, Schizophrenia Hx of prostate cancer Dysphagia Hypertension Thrombocytopenia    Hyperparathyroid with elevated calcium - medically treated LEFT superior parathyroid adenoma - Sestamibi today  FEN: IV fluids PRN/Dysphagia II ID:  None DVT: None Follow up:  TBD   Plan:  Will review and discuss with Dr. Rosendo Gros.  Work on a time for him to be seen.  We will see next week.    LOS: 13 days    Henry Smith 05/06/2018 [phone]

## 2018-05-06 NOTE — Care Management Important Message (Signed)
Important Message  Patient Details  Name: Henry Smith MRN: [id number] Date of Birth: July 10, 1941   Medicare Important Message Given:  Yes    Barb Merino Gimena [place] 05/06/2018, 3:23 PM

## 2018-05-06 NOTE — Progress Notes (Signed)
Physical Therapy Treatment Patient Details Name: Henry Smith MRN: [id number] DOB: 1941/05/03 Today's Date: 05/06/2018    History of Present Illness Pt is a 77 y.o. male admitted 04/23/18 with AMS, including non-verbal, not walking and R-side facial droop. Worked up for encephalopathy secondary to sepsis due to UTI, AKI. Head CT and MRI show no acute intracranial finding; mild small vessel change normal for age. PMH includes dementia, schizophrenia, prostate CA.   PT Comments    Pt initially minimally interacting with PT; brother called the room phone and spoke with pt, which improved his responsiveness and words spoken. Pt speaking clearly with ~75% appropriate responses; not consistent with verbalizations or command following. Today's session focused on increased participation with bed mobility, as pt has pulled out IV and condom cath (RN/NT aware). Pt performs automatic movements well in bed, but when asked, only following commands ~25% of session. When assisting, modA for bed mobility.  Brother plans to visit pt tomorrow. Reports that PTA, pt resides at assisted living facility where he ambulates without assistance and has "normal cognition" (brother not able to expand details regarding cognition, just says "normal").    Follow Up Recommendations  SNF;Supervision/Assistance - 24 hour     Equipment Recommendations  (TBD)    Recommendations for Other Services       Precautions / Restrictions Precautions Precautions: Fall Restrictions Weight Bearing Restrictions: No    Mobility  Bed Mobility Overal bed mobility: Needs Assistance Bed Mobility: Rolling Rolling: Mod assist;+2 for safety/equipment         General bed mobility comments: Pt inconsistently following commands and providing assist; able to reposition hips in bed and reach to rail, but when not following commands to assist, requires maxA+2 to roll R/L and scoot up in bed. Pt pulled off IV and condom catheter  requiring increased time for bed mobility and linen changes  Transfers                    Ambulation/Gait                 Stairs             Wheelchair Mobility    Modified Rankin (Stroke Patients Only)       Balance                                            Cognition Arousal/Alertness: Awake/alert Behavior During Therapy: Flat affect Overall Cognitive Status: Impaired/Different from baseline Area of Impairment: Attention;Following commands;Awareness;Safety/judgement;Problem solving                 Orientation Level: (Pt knows his name and name of brother who called; not answering other orientation questions) Current Attention Level: Focused   Following Commands: Follows one step commands inconsistently;Follows one step commands with increased time Safety/Judgement: Decreased awareness of safety;Decreased awareness of deficits Awareness: Intellectual Problem Solving: Slow processing;Decreased initiation;Requires verbal cues General Comments: Pt initially not responding verbally to PT, then received phone call (unable to answer, increased time getting phone to face) from brother and became more talkative, although still not fully appropriate responses. After this, pt more interactive, although still has moements where he has ~10 sec bouts of turning head back/forth and pursing lips. Brother reports baseline "normal cognition" but unsure what "normal" means.      Exercises      General Comments  Pertinent Vitals/Pain Pain Assessment: Faces Faces Pain Scale: No hurt Pain Intervention(s): Monitored during session    Home Living                      Prior Function            PT Goals (current goals can now be found in the care plan section) Acute Rehab PT Goals Patient Stated Goal: Unstated PT Goal Formulation: Patient unable to participate in goal setting Time For Goal Achievement: 05/15/18 Progress  towards PT goals: Progressing toward goals    Frequency    Min 2X/week      PT Plan Current plan remains appropriate    Co-evaluation              AM-PAC PT "6 Clicks" Daily Activity  Outcome Measure  Difficulty turning over in bed (including adjusting bedclothes, sheets and blankets)?: Unable Difficulty moving from lying on back to sitting on the side of the bed? : Unable Difficulty sitting down on and standing up from a chair with arms (e.g., wheelchair, bedside commode, etc,.)?: Unable Help needed moving to and from a bed to chair (including a wheelchair)?: A Lot Help needed walking in hospital room?: Total Help needed climbing 3-5 steps with a railing? : Total 6 Click Score: 7    End of Session   Activity Tolerance: Patient tolerated treatment well;Patient limited by fatigue Patient left: in bed;with call bell/phone within reach;with bed alarm set Nurse Communication: Mobility status;Need for lift equipment PT Visit Diagnosis: Other abnormalities of gait and mobility (R26.89);Other symptoms and signs involving the nervous system (R29.898)     Time: 7062-3762 PT Time Calculation (min) (ACUTE ONLY): 35 min  Charges:  $Therapeutic Activity: 23-37 mins                    G Codes:      Mabeline Caras, PT, DPT Acute Rehab Services  Pager: [place] 05/06/2018, 12:24 PM

## 2018-05-06 NOTE — Progress Notes (Signed)
PROGRESS NOTE    Henry Smith  VOZ:[id number] DOB: December 20, 1940 DOA: 04/23/2018 PCP: Caprice Renshaw, MD   Brief Narrative:  Henry Smith is a 77 y.o. male with a history of schizophrenia, bipolar disorder, dementia associate with neurosyphilis, prostate cancer, hypertension.  Patient presented secondary to worsening mental status, including becoming nonverbal with facial droop.  Initial concern for stroke, however, MRI was negative.  He was found to have a UTI with associated bacteremia and is currently on antibiotic therapy. [facility] stay with noted for worsening toxemia and per natremia, by reviewing his records and discussing with patient primary endocrinologist(Dr Solum in [place]), is known to have primary hyperparathyroidism.  Subjective: No significant events overnight, he denies any complaints  Assessment & Plan:   Principal Problem:   Altered mental status, unspecified Active Problems:   Schizoaffective disorder, bipolar type (HCC)   Sepsis secondary to UTI [facility])   UTI (urinary tract infection)   Acute metabolic encephalopathy   Proteus mirabilis infection   Bacteremia   Sepsis due to Gram negative bacteria (HCC)    Acute metabolic encephalopathy - In setting of acute infection in setting of underlying dementia secondary to neurosyphilis. -  MRI was negative for acute finding that could explain his mental status change. -  EEG without evidence of seizure activity.  - he was improving, but constant but back to worsening mental status, most likely in the setting of hypercalcemia and hyper natremia , have discussed with his legal guardian, apparently he is talkative, ambulate and was able to feed himself .  -Does have significant improvement of mental status today, stopping his psych medication including amantadine, Depakote and gabapentin to see if this improves his mentation, as well correction of his hypercalcemia and  hyponatremia.  Hyperparathyroidism/hypercalcemia -Being followed by endocrinology Dr. Gabriel Carina and [place], ultrasound as an outpatient does showing left superior adenoma most likely the cause of his primary hyperparathyroidism, apparently his PTH level steadily increasing , calcium peaked at 13.9, he did require calcitonin and Zometa, and has improved to 12.1 today . -Have discussed with Dr. Gabriel Carina, think at this point he needs parathyroidectomy, work-up significant for left upper thyroid lobe adenoma on ultrasound at [place] office, and sestamibi scan during hospital stay , general surgery consulted regarding parathyroidectomy .  Sepsis/Bacteremia/UTI secondary to Proteus Mirabilis - Sensitive to ceftriaxone.  Completed the course - Physiology of sepsis has resolved  Hypernatremia - Worsened with change in mental status.  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  Schizoaffective disorder Psychiatry consulted for evaluation. -His encephalopathy and lethargy, stopped his meds  Dysphagia - Possibly related to underlying dementia and schizophrenia vs metabolic encephalopathy from infection. -Continue IV fluids -followed by SLP -Continue to encourage oral intake  Acute kidney injury - Resolved.  Essential hypertension -Labile, overall acceptable, continue with losartan  Thrombocytopenia Chronic. Improved slightly, but otherwise stable.  Hypokalemia Resolved with supplementation   DVT prophylaxis: Lovenox Code Status:   Code Status: Full Code Family Communication: Discussed with legal guardian via phone 05/05/2018 Disposition Plan: Discharge to SNF when medically stable   Consultants:   Neurology   Procedures:   EEG (5/16) IMPRESSION:  This is an abnormal EEG demonstrating a mild to moderate diffuse slowing of electrocerebral activity.   This can be seen in a wide variety of encephalopathic state including those of a toxic, metabolic, or degenerative nature.  There were no focal, hemispheric, or lateralizing features.  No epileptiform activity was recorded.   Antimicrobials:  Levaquin (5/11)  Aztreonam (5/11>>5/12)  Ceftriaxone (5/12>>    Objective: Vitals:   05/06/18 0004 05/06/18 0505 05/06/18 0731 05/06/18 1215  BP: 122/69 132/84 120/79 135/83  Pulse: 99 94 97 (!) 106  Resp: 20 20 18 20   Temp: (!) 97.5 F (36.4 C)  (!) 97.5 F (36.4 C) 98.2 F (36.8 C)  TempSrc: Oral  Oral Oral  SpO2: 97% 98% 97% 97%  Weight:      Height:        Intake/Output Summary (Last 24 hours) at 05/06/2018 1310 Last data filed at 05/06/2018 0845 Gross per 24 hour  Intake 2837.92 ml  Output 1000 ml  Net 1837.92 ml   Filed Weights   04/23/18 1429 04/23/18 2025 04/24/18 0548  Weight: 90.7 kg (200 lb) 98.2 kg (216 lb 7.9 oz) 98.6 kg (217 lb 6 oz)    Examination:  Awake Alert, Oriented X 1, he is much more awake and appropriate today. [place].AT,PERRAL Symmetrical Chest wall movement, Good air movement bilaterally, CTAB RRR,No Gallops,Rubs or new Murmurs, No Parasternal Heave +ve B.Sounds, Abd Soft, No tenderness, No rebound - guarding or rigidity. No Cyanosis, Clubbing or edema, No new Rash or bruise     Data Reviewed: I have personally reviewed following labs and imaging studies  CBC: Recent Labs  Lab 04/30/18 0410 05/05/18 0237 05/06/18 0411  WBC 7.3 10.5 9.8  HGB 13.1 14.2 13.5  HCT 41.1 46.2 42.6  MCV 85.8 89.4 87.1  PLT 133* 172 785   Basic Metabolic Panel: Recent Labs  Lab 05/04/18 1756 05/05/18 0237 05/05/18 1451 05/05/18 2031 05/06/18 0411  NA 152* 150* 149* 147* 148*  K 3.7 3.9 4.0 3.7 3.6  CL 120* 121* 119* 118* 116*  CO2 27 26 25 26 27   GLUCOSE 122* 99 96 122* 101*  BUN 16 15 15 16 15   CREATININE 0.90 0.83 0.85 0.92 0.85  CALCIUM 12.8* 12.2* 12.1* 12.1* 11.9*   GFR: Estimated Creatinine  Clearance: 91.2 mL/min (by C-G formula based on SCr of 0.85 mg/dL). Liver Function Tests: Recent Labs  Lab 05/03/18 1149  ALBUMIN 2.3*   No results for input(s): LIPASE, AMYLASE in the last 168 hours. No results for input(s): AMMONIA in the last 168 hours. Coagulation Profile: No results for input(s): INR, PROTIME in the last 168 hours. Cardiac Enzymes: No results for input(s): CKTOTAL, CKMB, CKMBINDEX, TROPONINI in the last 168 hours. BNP (last 3 results) No results for input(s): PROBNP in the last 8760 hours. HbA1C: No results for input(s): HGBA1C in the last 72 hours. CBG: Recent Labs  Lab 05/05/18 1100 05/05/18 1637 05/05/18 2146 05/06/18 0617 05/06/18 1155  GLUCAP 93 113* 119* 83 106*   Lipid Profile: No results for input(s): CHOL, HDL, LDLCALC, TRIG, CHOLHDL, LDLDIRECT in the last 72 hours. Thyroid Function Tests: No results for input(s): TSH, T4TOTAL, FREET4, T3FREE, THYROIDAB in the last 72 hours. Anemia Panel: No results for input(s): VITAMINB12, FOLATE, FERRITIN, TIBC, IRON, RETICCTPCT in the last 72 hours. Sepsis Labs: No results for input(s): PROCALCITON, LATICACIDVEN in the last 168 hours.  No results found for this or any previous visit (from the past 240 hour(s)).       Radiology Studies: Nm Parathyroid W/spect  Result Date: 05/05/2018 CLINICAL DATA:  Hyperparathyroidism EXAM: NM PARATHYROID SCINTIGRAPHY AND SPECT IMAGING TECHNIQUE: Following intravenous administration of radiopharmaceutical, early and 2-hour delayed planar images were obtained in the  anterior projection. Delayed triplanar SPECT images were also obtained at 2 hours. RADIOPHARMACEUTICALS:  25.2 mCi Tc-44m Sestamibi IV COMPARISON:  None FINDINGS: Planar imaging: Normal initial distribution of sestamibi within the thyroid lobes. Normal washout of tracer from thyroid tissue with a focal abnormal retained sestamibi at the upper pole of the LEFT thyroid lobe suspicious for parathyroid adenoma. No  ectopic localization of sestamibi within the mediastinum. SPECT imaging: Abnormal focus of retained sestamibi identified at the upper to mid LEFT thyroid lobe consistent with a LEFT superior parathyroid adenoma. No abnormal sestamibi retention at the remaining parathyroid glands. No ectopic localization of tracer within the mediastinum. IMPRESSION: Abnormal retained sestamibi at the upper pole of the LEFT thyroid lobe consistent with a LEFT superior parathyroid adenoma. Electronically Signed   By: Lavonia Dana M.D.   On: 05/05/2018 15:15        Scheduled Meds: . feeding supplement (ENSURE ENLIVE)  237 mL Oral TID BM  . hydrALAZINE  10 mg Intravenous Q6H  . latanoprost  1 drop Both Eyes QHS  . losartan  25 mg Oral Daily   Continuous Infusions: . dextrose 125 mL/hr at 05/06/18 0926     LOS: 13 days     Phillips Climes, MD Pager [phone] [facility] 05/06/2018, 1:10 PM Pager: [phone]  If 7PM-7AM, please contact night-coverage [email or web address] 05/06/2018, 1:10 PM

## 2018-05-06 NOTE — Progress Notes (Signed)
CSW continuing to follow. Patient will return to SNF when medically ready.  CSW to continue to follow.  Laveda Abbe, [place] Clinical Social Worker [phone]

## 2018-05-06 NOTE — Care Management Note (Signed)
Case Management Note  Patient Details  Name: Henry Smith MRN: [id number] Date of Birth: [birth year]  Subjective/Objective:                    Action/Plan: Plan continues for patient to return to SNF when medically ready. CM following.    Expected Discharge Date:                  Expected Discharge Plan:  Skilled Nursing Facility  In-House Referral:  Clinical Social Work  Discharge planning Services     Post Acute Care Choice:    Choice offered to:     DME Arranged:    DME Agency:     HH Arranged:    [place] Agency:     Status of Service:  In process, will continue to follow  If discussed at Long Length of Stay Meetings, dates discussed:    Additional Comments:  Pollie Friar, RN 05/06/2018, 3:35 PM

## 2018-05-07 DIAGNOSIS — E213 Hyperparathyroidism, unspecified: Secondary | ICD-10-CM

## 2018-05-07 LAB — CBC
HEMATOCRIT: 40.4 % (ref 39.0–52.0)
HEMOGLOBIN: 12.8 g/dL — AB (ref 13.0–17.0)
MCH: 27.4 pg (ref 26.0–34.0)
MCHC: 31.7 g/dL (ref 30.0–36.0)
MCV: 86.3 fL (ref 78.0–100.0)
Platelets: 143 10*3/uL — ABNORMAL LOW (ref 150–400)
RBC: 4.68 MIL/uL (ref 4.22–5.81)
RDW: 15.8 % — ABNORMAL HIGH (ref 11.5–15.5)
WBC: 7.6 10*3/uL (ref 4.0–10.5)

## 2018-05-07 LAB — BASIC METABOLIC PANEL
Anion gap: 5 (ref 5–15)
BUN: 17 mg/dL (ref 6–20)
CHLORIDE: 116 mmol/L — AB (ref 101–111)
CO2: 23 mmol/L (ref 22–32)
CREATININE: 0.88 mg/dL (ref 0.61–1.24)
Calcium: 11.5 mg/dL — ABNORMAL HIGH (ref 8.9–10.3)
GFR calc non Af Amer: >60 mL/min (ref >60)
Glucose, Bld: 109 mg/dL — ABNORMAL HIGH (ref 65–99)
POTASSIUM: 5.6 mmol/L — AB (ref 3.5–5.1)
SODIUM: 144 mmol/L (ref 135–145)

## 2018-05-07 LAB — GLUCOSE, CAPILLARY
GLUCOSE-CAPILLARY: 104 mg/dL — AB (ref 65–99)
GLUCOSE-CAPILLARY: 92 mg/dL (ref 65–99)
Glucose-Capillary: 101 mg/dL — ABNORMAL HIGH (ref 65–99)
Glucose-Capillary: 72 mg/dL (ref 65–99)

## 2018-05-07 LAB — POTASSIUM: Potassium: 4.1 mmol/L (ref 3.5–5.1)

## 2018-05-07 MED ORDER — LOSARTAN POTASSIUM 50 MG PO TABS
25.0000 mg | ORAL_TABLET | Freq: Every day | ORAL | Status: DC
  Administered 2018-05-07 – 2018-05-12 (×6): 25 mg via ORAL
  Filled 2018-05-07 (×6): qty 1

## 2018-05-07 NOTE — Plan of Care (Signed)
  Problem: Education: Goal: Knowledge of General Education information will improve Outcome: Progressing   Problem: Health Behavior/Discharge Planning: Goal: Ability to manage health-related needs will improve Outcome: Progressing   Problem: Clinical Measurements: Goal: Ability to maintain clinical measurements within normal limits will improve Outcome: Progressing Goal: Will remain free from infection Outcome: Progressing Goal: Diagnostic test results will improve Outcome: Progressing Goal: Respiratory complications will improve Outcome: Progressing Goal: Cardiovascular complication will be avoided Outcome: Progressing   Problem: Activity: Goal: Risk for activity intolerance will decrease Outcome: Progressing   Problem: Nutrition: Goal: Adequate nutrition will be maintained Outcome: Progressing   Problem: Coping: Goal: Level of anxiety will decrease Outcome: Progressing   Problem: Elimination: Goal: Will not experience complications related to bowel motility Outcome: Progressing Goal: Will not experience complications related to urinary retention Outcome: Progressing   Problem: Pain Managment: Goal: General experience of comfort will improve Outcome: Progressing   Problem: Safety: Goal: Ability to remain free from injury will improve Outcome: Progressing   Problem: Skin Integrity: Goal: Risk for impaired skin integrity will decrease Outcome: Progressing   Problem: Fluid Volume: Goal: Hemodynamic stability will improve Outcome: Progressing   Problem: Clinical Measurements: Goal: Diagnostic test results will improve Outcome: Progressing Goal: Signs and symptoms of infection will decrease Outcome: Progressing   Problem: Clinical Measurements: Goal: Diagnostic test results will improve Outcome: Progressing Goal: Signs and symptoms of infection will decrease Outcome: Progressing   Problem: Respiratory: Goal: Ability to maintain adequate ventilation will  improve Outcome: Progressing   Problem: Education: Goal: Knowledge of disease or condition will improve Outcome: Progressing Goal: Knowledge of secondary prevention will improve Outcome: Progressing Goal: Knowledge of patient specific risk factors addressed and post discharge goals established will improve Outcome: Progressing   Problem: Coping: Goal: Will verbalize positive feelings about self Outcome: Progressing Goal: Will identify appropriate support needs Outcome: Progressing   Problem: Health Behavior/Discharge Planning: Goal: Ability to manage health-related needs will improve Outcome: Progressing   Problem: Self-Care: Goal: Ability to participate in self-care as condition permits will improve Outcome: Progressing Goal: Verbalization of feelings and concerns over difficulty with self-care will improve Outcome: Progressing Goal: Ability to communicate needs accurately will improve Outcome: Progressing   Problem: Nutrition: Goal: Risk of aspiration will decrease Outcome: Progressing Goal: Dietary intake will improve Outcome: Progressing   Problem: Ischemic Stroke/TIA Tissue Perfusion: Goal: Complications of ischemic stroke/TIA will be minimized Outcome: Progressing

## 2018-05-07 NOTE — Progress Notes (Signed)
Pt HR noted to be in the 130's and sustaining; upon checking on pt, he was noted to be attempting to climb OOB and lower half of body was hanging off the side of the bed. Pt was assisted back into the bed and repositioned comfortably, and several minutes passed while waiting for pt to relax again. At that time, pt HR still 117 and holding, on call provider notified. Per on call provider, no new orders at this time.

## 2018-05-07 NOTE — Progress Notes (Signed)
PROGRESS NOTE    Henry Smith  WJX:[id number] DOB: [birth year] DOA: 04/23/2018 PCP: Caprice Renshaw, MD   Brief Narrative:  Henry Smith is a 77 y.o. male with a history of schizophrenia, bipolar disorder, dementia associate with neurosyphilis, prostate cancer, hypertension.  Patient presented secondary to worsening mental status, including becoming nonverbal with facial droop.  Initial concern for stroke, however, MRI was negative.  He was found to have a UTI with associated bacteremia and is currently on antibiotic therapy. [facility] stay with noted for worsening toxemia and per natremia, by reviewing his records and discussing with patient primary endocrinologist(Dr Solum in [place]), is known to have primary hyperparathyroidism.  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 -  MRI was negative for acute finding that could explain his mental status change. -  EEG without evidence of seizure activity.  - he was improving, but constant but back to worsening mental status, most likely in the setting of hypercalcemia and hyper natremia , have discussed with his legal guardian, apparently he is talkative, ambulate and was able to feed himself .  -Does have significant improvement of mental status after stopping his psych medication including amantadine, Depakote and gabapentin and correction of his hypercalcemia and hyponatremia.  Hyperparathyroidism/hypercalcemia -Being followed by endocrinology Dr.  Gabriel Carina at [facility], ultrasound as an outpatient does showing left superior adenoma most likely the cause of his primary hyperparathyroidism, apparently his PTH level steadily increasing , calcium peaked at 13.9, he did require calcitonin and Zometa, calcium has improved to 11.5 today -Have discussed with Dr. Gabriel Carina, think at this point he needs parathyroidectomy, work-up significant for left upper thyroid lobe adenoma on ultrasound at [place] office, and sestamibi scan during hospital stay , general surgery consulted regarding parathyroidectomy .   Sepsis/Bacteremia/UTI secondary to Proteus Mirabilis - Sensitive to ceftriaxone.  Completed the course - Physiology of sepsis has resolved  Hypernatremia - Worsened with change in mental status.  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  Schizoaffective disorder Psychiatry consulted for evaluation. -given encephalopathy and lethargy, stopped his meds  Dysphagia - Possibly related to underlying dementia and schizophrenia vs metabolic encephalopathy from infection. -Continue IV fluids -followed by SLP -Continue to encourage oral intake  Acute kidney injury - Resolved.  Essential hypertension -Labile, overall acceptable, continue with losartan -I have held losartan this a.m., given hyperkalemia, 5.6but it is hemolyzed, repeat level is 4.1, he will be resumed back on losartan  Thrombocytopenia Chronic. Improved slightly, but otherwise stable.  Hypokalemia Resolved with supplementation   DVT prophylaxis: Lovenox Code Status:   Code Status: Full Code Family Communication: Discussed with legal guardian via phone 05/05/2018 Disposition Plan: Discharge to SNF when medically stable   Consultants:   Neurology  General surgery Procedures:   EEG (5/16) IMPRESSION:  This is an abnormal EEG demonstrating a mild to  moderate diffuse slowing of electrocerebral activity.  This can be seen in a  wide variety of encephalopathic state including those of a toxic, metabolic, or degenerative nature.  There were no focal, hemispheric, or lateralizing features.  No epileptiform activity was recorded.   Antimicrobials:  Levaquin (5/11)  Aztreonam (5/11>>5/12)  Ceftriaxone (5/12>>    Objective: Vitals:   05/06/18 2334 05/07/18 0402 05/07/18 0723 05/07/18 1140  BP: 113/64 107/70 124/70 (!) 143/73  Pulse: (!) 103 (!) 106 98 (!) 115  Resp: 20 20 18 18   Temp:   98.2 F (36.8 C)   TempSrc:   Oral   SpO2: 97% 96% 99% 95%  Weight:      Height:        Intake/Output Summary (Last 24 hours) at 05/07/2018 1633 Last data filed at 05/07/2018 1237 Gross per 24 hour  Intake 980 ml  Output 1051 ml  Net -71 ml   Filed Weights   04/23/18 1429 04/23/18 2025 04/24/18 0548  Weight: 90.7 kg (200 lb) 98.2 kg (216 lb 7.9 oz) 98.6 kg (217 lb 6 oz)    Examination:  Awake, alert oriented x1, pleasant, mentation improving Symmetrical Chest wall movement, Good air movement bilaterally, CTAB RRR,No Gallops,Rubs or new Murmurs, No Parasternal Heave +ve B.Sounds, Abd Soft, No tenderness, , No rebound - guarding or rigidity. No Cyanosis, Clubbing or edema, No new Rash or bruise      Data Reviewed: I have personally reviewed following labs and imaging studies  CBC: Recent Labs  Lab 05/05/18 0237 05/06/18 0411 05/07/18 0617  WBC 10.5 9.8 7.6  HGB 14.2 13.5 12.8*  HCT 46.2 42.6 40.4  MCV 89.4 87.1 86.3  PLT 172 154 294*   Basic Metabolic Panel: Recent Labs  Lab 05/05/18 1451 05/05/18 2031 05/06/18 0411 05/06/18 1755 05/07/18 0251 05/07/18 0617  NA 149* 147* 148* 144 144  --   K 4.0 3.7 3.6 3.4* 5.6* 4.1  CL 119* 118* 116* 113* 116*  --   CO2 25 26 27 25 23   --   GLUCOSE 96 122* 101* 91 109*  --   BUN 15 16 15 17 17   --   CREATININE 0.85 0.92 0.85 0.91 0.88  --   CALCIUM 12.1* 12.1* 11.9* 11.5* 11.5*   --    GFR: Estimated Creatinine Clearance: 88.1 mL/min (by C-G formula based on SCr of 0.88 mg/dL). Liver Function Tests: Recent Labs  Lab 05/03/18 1149  ALBUMIN 2.3*   No results for input(s): LIPASE, AMYLASE in the last 168 hours. No results for input(s): AMMONIA in the last 168 hours. Coagulation Profile: No results for input(s): INR, PROTIME in the last 168 hours. Cardiac Enzymes: No results for input(s): CKTOTAL, CKMB, CKMBINDEX, TROPONINI in the last 168 hours. BNP (last 3 results) No results for input(s): PROBNP in the last 8760 hours. HbA1C: No results for input(s): HGBA1C in the last 72 hours. CBG: Recent Labs  Lab 05/06/18 1155 05/06/18 1534 05/06/18 2108 05/07/18 0603 05/07/18 1158  GLUCAP 106* 90 86 101* 104*   Lipid Profile: No results for input(s): CHOL, HDL, LDLCALC, TRIG, CHOLHDL, LDLDIRECT in the last 72 hours. Thyroid Function Tests: No results for input(s): TSH, T4TOTAL, FREET4, T3FREE, THYROIDAB in the last 72 hours. Anemia Panel: No results for input(s): VITAMINB12, FOLATE, FERRITIN, TIBC, IRON, RETICCTPCT in the last 72 hours. Sepsis Labs: No results for input(s): PROCALCITON, LATICACIDVEN in the last 168 hours.  No results found for this or any previous visit (from the past 240 hour(s)).       Radiology Studies: No results found.  Scheduled Meds: . feeding supplement (ENSURE ENLIVE)  237 mL Oral TID BM  . hydrALAZINE  10 mg Intravenous Q6H  . latanoprost  1 drop Both Eyes QHS   Continuous Infusions: . dextrose 125 mL/hr at 05/07/18 0531     LOS: 14 days     Phillips Climes, MD Pager [phone] [facility] 05/07/2018, 4:33 PM Pager: [phone]  If 7PM-7AM, please contact night-coverage [email or web address] 05/07/2018, 4:33 PM

## 2018-05-08 LAB — BASIC METABOLIC PANEL
Anion gap: 5 (ref 5–15)
BUN: 12 mg/dL (ref 6–20)
CALCIUM: 11 mg/dL — AB (ref 8.9–10.3)
CO2: 25 mmol/L (ref 22–32)
Chloride: 111 mmol/L (ref 101–111)
Creatinine, Ser: 0.74 mg/dL (ref 0.61–1.24)
GLUCOSE: 86 mg/dL (ref 65–99)
POTASSIUM: 3.6 mmol/L (ref 3.5–5.1)
SODIUM: 141 mmol/L (ref 135–145)

## 2018-05-08 LAB — GLUCOSE, CAPILLARY
GLUCOSE-CAPILLARY: 120 mg/dL — AB (ref 65–99)
GLUCOSE-CAPILLARY: 97 mg/dL (ref 65–99)
GLUCOSE-CAPILLARY: 110 mg/dL — AB (ref 65–99)

## 2018-05-08 NOTE — Plan of Care (Signed)
  Problem: Education: Goal: Knowledge of General Education information will improve Outcome: Progressing   Problem: Health Behavior/Discharge Planning: Goal: Ability to manage health-related needs will improve Outcome: Progressing   Problem: Clinical Measurements: Goal: Ability to maintain clinical measurements within normal limits will improve Outcome: Progressing Goal: Will remain free from infection Outcome: Progressing Goal: Diagnostic test results will improve Outcome: Progressing Goal: Respiratory complications will improve Outcome: Progressing Goal: Cardiovascular complication will be avoided Outcome: Progressing   Problem: Activity: Goal: Risk for activity intolerance will decrease Outcome: Progressing   Problem: Nutrition: Goal: Adequate nutrition will be maintained Outcome: Progressing   Problem: Coping: Goal: Level of anxiety will decrease Outcome: Progressing   Problem: Elimination: Goal: Will not experience complications related to bowel motility Outcome: Progressing Goal: Will not experience complications related to urinary retention Outcome: Progressing   Problem: Pain Managment: Goal: General experience of comfort will improve Outcome: Progressing   Problem: Safety: Goal: Ability to remain free from injury will improve Outcome: Progressing   Problem: Skin Integrity: Goal: Risk for impaired skin integrity will decrease Outcome: Progressing   Problem: Fluid Volume: Goal: Hemodynamic stability will improve Outcome: Progressing   Problem: Clinical Measurements: Goal: Diagnostic test results will improve Outcome: Progressing Goal: Signs and symptoms of infection will decrease Outcome: Progressing   Problem: Clinical Measurements: Goal: Diagnostic test results will improve Outcome: Progressing Goal: Signs and symptoms of infection will decrease Outcome: Progressing   Problem: Respiratory: Goal: Ability to maintain adequate ventilation will  improve Outcome: Progressing   Problem: Education: Goal: Knowledge of disease or condition will improve Outcome: Progressing Goal: Knowledge of secondary prevention will improve Outcome: Progressing Goal: Knowledge of patient specific risk factors addressed and post discharge goals established will improve Outcome: Progressing   Problem: Coping: Goal: Will verbalize positive feelings about self Outcome: Progressing Goal: Will identify appropriate support needs Outcome: Progressing   Problem: Health Behavior/Discharge Planning: Goal: Ability to manage health-related needs will improve Outcome: Progressing   Problem: Self-Care: Goal: Ability to participate in self-care as condition permits will improve Outcome: Progressing Goal: Verbalization of feelings and concerns over difficulty with self-care will improve Outcome: Progressing Goal: Ability to communicate needs accurately will improve Outcome: Progressing   Problem: Nutrition: Goal: Risk of aspiration will decrease Outcome: Progressing Goal: Dietary intake will improve Outcome: Progressing   Problem: Ischemic Stroke/TIA Tissue Perfusion: Goal: Complications of ischemic stroke/TIA will be minimized Outcome: Progressing

## 2018-05-08 NOTE — Progress Notes (Signed)
PROGRESS NOTE    Henry Smith  URK:[id number] DOB: 1941-10-03 DOA: 04/23/2018 PCP: Caprice Renshaw, MD   Brief Narrative:  Henry Smith is a 77 y.o. male with a history of schizophrenia, bipolar disorder, dementia associate with neurosyphilis, prostate cancer, hypertension.  Patient presented secondary to worsening mental status, including becoming nonverbal with facial droop.  Initial concern for stroke, however, MRI was negative.  He was found to have a UTI with associated bacteremia and is currently on antibiotic therapy. [facility] stay with noted for worsening toxemia and per natremia, by reviewing his records and discussing with patient primary endocrinologist(Dr Solum in [place]), is known to have primary hyperparathyroidism.  Subjective: No significant events overnight, he denies any complaints  Assessment & Plan:   Principal Problem:   Altered mental status, unspecified Active Problems:   Schizoaffective disorder, bipolar type (HCC)   Sepsis secondary to UTI [facility])   UTI (urinary tract infection)   Acute metabolic encephalopathy   Proteus mirabilis infection   Bacteremia   Sepsis due to Gram negative bacteria (HCC)    Acute metabolic encephalopathy - In setting of acute infection in setting of underlying dementia secondary to neurosyphilis. -  MRI was negative for acute finding that could explain his mental status change. -  EEG without evidence of seizure activity.  -He was improving initially, but he had a setback where he became encephalopathic again due to his hyponatremia and hypercalcemia, after correction of his hypercalcemia and hyponatremia, appears he is improving more awake and appropriate. - I have discussed with his legal guardian, apparently he is talkative, ambulate and was able to feed himself .  -Does have significant improvement of mental status after stopping his psych medication including amantadine, Depakote and gabapentin so I will continue to  hold .  Hyperparathyroidism/hypercalcemia -Being followed by endocrinology Dr. Gabriel Carina at [facility], ultrasound as an outpatient does showing left superior adenoma most likely the cause of his primary hyperparathyroidism, apparently his PTH level steadily increasing , calcium peaked at 13.9, he did require calcitonin and Zometa, calcium 10 use to improve -Have discussed with Dr. Gabriel Carina, think at this point he needs parathyroidectomy, work-up significant for left upper thyroid lobe adenoma on ultrasound at [place] office, and sestamibi scan during hospital stay , have discussed with general surgery today, plan for parathyroid adenoma removal this coming week .   Sepsis/Bacteremia/UTI secondary to Proteus Mirabilis - Sensitive to ceftriaxone.  Completed the course - Physiology of sepsis has resolved  Hypernatremia - Worsened with change in mental status.  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  Schizoaffective disorder Psychiatry consulted for evaluation. -given encephalopathy and lethargy, stopped his meds  Dysphagia - Possibly related to underlying dementia and schizophrenia vs metabolic encephalopathy from infection. -Continue IV fluids -followed by SLP -Continue to encourage oral intake  Acute kidney injury - Resolved.  Essential hypertension -Labile, overall acceptable, continue with losartan -I have held losartan this a.m., given hyperkalemia, 5.6but it is hemolyzed, repeat level is 4.1, he will be resumed back on losartan  Thrombocytopenia Chronic. Improved slightly, but otherwise stable.  Hypokalemia Resolved with supplementation   DVT prophylaxis: Lovenox Code Status:   Code Status: Full Code Family Communication: Discussed with legal guardian via phone 05/05/2018 Disposition Plan: Discharge to SNF when medically stable   Consultants:    Neurology  General surgery   Procedures:   EEG (5/16) IMPRESSION:  This is an abnormal  EEG demonstrating a mild to moderate diffuse slowing of electrocerebral activity.  This can be seen in a wide variety of encephalopathic state including those of a toxic, metabolic, or degenerative nature.  There were no focal, hemispheric, or lateralizing features.  No epileptiform activity was recorded.   Antimicrobials:  Levaquin (5/11)  Aztreonam (5/11>>5/12)  Ceftriaxone (5/12>>    Objective: Vitals:   05/08/18 0452 05/08/18 0854 05/08/18 1245 05/08/18 1302  BP: 132/84 (!) 139/93 133/68 133/68  Pulse: 86 92 (!) 109 (!) 106  Resp: 16 18 15 16   Temp: 98.3 F (36.8 C) 98.1 F (36.7 C) 98.7 F (37.1 C) 98.7 F (37.1 C)  TempSrc: Oral Oral Oral Oral  SpO2: 98% 98% 96% 98%  Weight:      Height:        Intake/Output Summary (Last 24 hours) at 05/08/2018 1330 Last data filed at 05/08/2018 0545 Gross per 24 hour  Intake 6127.92 ml  Output 1100 ml  Net 5027.92 ml   Filed Weights   04/23/18 1429 04/23/18 2025 04/24/18 0548  Weight: 90.7 kg (200 lb) 98.2 kg (216 lb 7.9 oz) 98.6 kg (217 lb 6 oz)    Examination:  Awake alert, pleasant, communicative, more appropriate today  Good air entry bilaterally, no wheezing rales rhonchi  Regular rate and rhythm, no rubs murmurs gallops  +ve B.Sounds, Abd Soft, No tenderness, , No rebound - guarding or rigidity. No Cyanosis, Clubbing or edema, No new Rash or bruise      Data Reviewed: I have personally reviewed following labs and imaging studies  CBC: Recent Labs  Lab 05/05/18 0237 05/06/18 0411 05/07/18 0617  WBC 10.5 9.8 7.6  HGB 14.2 13.5 12.8*  HCT 46.2 42.6 40.4  MCV 89.4 87.1 86.3  PLT 172 154 416*   Basic Metabolic Panel: Recent Labs  Lab 05/05/18 2031 05/06/18 0411 05/06/18 1755 05/07/18 0251 05/07/18 0617 05/08/18 0414  NA 147* 148* 144 144  --  141  K 3.7 3.6 3.4* 5.6* 4.1 3.6  CL 118* 116* 113* 116*   --  111  CO2 26 27 25 23   --  25  GLUCOSE 122* 101* 91 109*  --  86  BUN 16 15 17 17   --  12  CREATININE 0.92 0.85 0.91 0.88  --  0.74  CALCIUM 12.1* 11.9* 11.5* 11.5*  --  11.0*   GFR: Estimated Creatinine Clearance: 96.9 mL/min (by C-G formula based on SCr of 0.74 mg/dL). Liver Function Tests: Recent Labs  Lab 05/03/18 1149  ALBUMIN 2.3*   No results for input(s): LIPASE, AMYLASE in the last 168 hours. No results for input(s): AMMONIA in the last 168 hours. Coagulation Profile: No results for input(s): INR, PROTIME in the last 168 hours. Cardiac Enzymes: No results for input(s): CKTOTAL, CKMB, CKMBINDEX, TROPONINI in the last 168 hours. BNP (last 3 results) No results for input(s): PROBNP in the last 8760 hours. HbA1C: No results for input(s): HGBA1C in the last 72 hours. CBG: Recent Labs  Lab 05/07/18 1158 05/07/18 1705 05/07/18 2228 05/08/18 0627 05/08/18 1150  GLUCAP 104* 92 72 120* 97   Lipid Profile: No results for input(s): CHOL, HDL, LDLCALC, TRIG, CHOLHDL, LDLDIRECT in the last 72 hours. Thyroid Function Tests: No results for input(s): TSH, T4TOTAL, FREET4, T3FREE, THYROIDAB in the last 72 hours. Anemia Panel: No results for input(s): VITAMINB12, FOLATE, FERRITIN, TIBC, IRON, RETICCTPCT in the last 72 hours. Sepsis Labs: No results for input(s): PROCALCITON, LATICACIDVEN in the last 168 hours.  No results found for this or any previous visit (from the past 240 hour(s)).       Radiology Studies: No results found.      Scheduled Meds: . feeding supplement (ENSURE ENLIVE)  237 mL Oral TID BM  . hydrALAZINE  10 mg Intravenous Q6H  . latanoprost  1 drop Both Eyes QHS  . losartan  25 mg Oral Daily   Continuous Infusions: . dextrose 100 mL/hr at 05/07/18 1709     LOS: 15 days     Phillips Climes, MD Pager [phone] [facility] 05/08/2018, 1:30 PM Pager: [phone]  If 7PM-7AM, please contact  night-coverage [email or web address] 05/08/2018, 1:30 PM

## 2018-05-09 LAB — BASIC METABOLIC PANEL
ANION GAP: 5 (ref 5–15)
BUN: 16 mg/dL (ref 6–20)
CALCIUM: 11.2 mg/dL — AB (ref 8.9–10.3)
CO2: 26 mmol/L (ref 22–32)
Chloride: 112 mmol/L — ABNORMAL HIGH (ref 101–111)
Creatinine, Ser: 0.82 mg/dL (ref 0.61–1.24)
GLUCOSE: 132 mg/dL — AB (ref 65–99)
Potassium: 3.6 mmol/L (ref 3.5–5.1)
SODIUM: 143 mmol/L (ref 135–145)

## 2018-05-09 LAB — GLUCOSE, CAPILLARY
GLUCOSE-CAPILLARY: 103 mg/dL — AB (ref 65–99)
Glucose-Capillary: 103 mg/dL — ABNORMAL HIGH (ref 65–99)
Glucose-Capillary: 77 mg/dL (ref 65–99)

## 2018-05-09 NOTE — Progress Notes (Signed)
   Subjective/Chief Complaint: Pt doing well. No complaints this AM    Objective: Vital signs in last 24 hours: Temp:  [97.7 F (36.5 C)-99.1 F (37.3 C)] 97.7 F (36.5 C) (05/27 0834) Pulse Rate:  [93-109] 93 (05/27 0834) Resp:  [15-22] 20 (05/27 0834) BP: (113-140)/(65-84) 138/81 (05/27 0834) SpO2:  [96 %-100 %] 96 % (05/27 0834) Last BM Date: 05/07/18  Intake/Output from previous day: 05/26 0701 - 05/27 0700 In: 2833.3 [P.O.:1360; I.V.:1473.3] Out: 200 [Urine:200] Intake/Output this shift: Total I/O In: 360 [P.O.:360] Out: -   Constitutional: No acute distress, conversant, appears states age. Eyes: Anicteric sclerae, moist conjunctiva, no lid lag Neck: No palp masses, no thyromegaly, no cerv LAD Lungs: Clear to auscultation bilaterally, normal respiratory effort CV: regular rate and rhythm, no murmurs, no peripheral edema, pedal pulses 2+ GI: Soft, no masses or hepatosplenomegaly, non-tender to palpation Skin: No rashes, palpation reveals normal turgor Psychiatric: appropriate judgment and insight, oriented to person, place, and time   Lab Results:  Recent Labs    05/07/18 0617  WBC 7.6  HGB 12.8*  HCT 40.4  PLT 143*   BMET Recent Labs    05/08/18 0414 05/09/18 0348  NA 141 143  K 3.6 3.6  CL 111 112*  CO2 25 26  GLUCOSE 86 132*  BUN 12 16  CREATININE 0.74 0.82  CALCIUM 11.0* 11.2*   Anti-infectives: Anti-infectives (From admission, onward)   Start     Dose/Rate Route Frequency Ordered Stop   04/24/18 1800  cefTRIAXone (ROCEPHIN) 2 g in sodium chloride 0.9 % 100 mL IVPB  Status:  Discontinued     2 g 200 mL/hr over 30 Minutes Intravenous Every 24 hours 04/24/18 1323 05/03/18 0628   04/24/18 0400  aztreonam (AZACTAM) 1 g in sodium chloride 0.9 % 100 mL IVPB  Status:  Discontinued     1 g 200 mL/hr over 30 Minutes Intravenous Every 8 hours 04/23/18 1826 04/24/18 1323   04/23/18 1715  levofloxacin (LEVAQUIN) IVPB 750 mg     750 mg 100 mL/hr  over 90 Minutes Intravenous  Once 04/23/18 1703 04/23/18 1909   04/23/18 1715  aztreonam (AZACTAM) 2 g in sodium chloride 0.9 % 100 mL IVPB     2 g 200 mL/hr over 30 Minutes Intravenous  Once 04/23/18 1703 04/23/18 1953      Assessment/Plan: Sepsis with Proteus bacteremia/UTI Hypernatremia Acute metabolic encephalopathy Dementia associate with neurosyphilis, Schizophrenia Hx of prostate cancer Dysphagia Hypertension Thrombocytopenia    Hyperparathyroid with elevated calcium - 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

## 2018-05-09 NOTE — Progress Notes (Signed)
PROGRESS NOTE    Henry Smith  WUJ:[id number] DOB: Oct 27, 1941 DOA: 04/23/2018 PCP: Caprice Renshaw, MD   Brief Narrative:  Henry Smith is a 77 y.o. male with a history of schizophrenia, bipolar disorder, dementia associate with neurosyphilis, prostate cancer, hypertension.  Patient presented secondary to worsening mental status, including becoming nonverbal with facial droop.  Initial concern for stroke, however, MRI was negative.  He was found to have a UTI with associated bacteremia and is currently on antibiotic therapy. [facility] stay with noted for worsening toxemia and per natremia, by reviewing his records and discussing with patient primary endocrinologist(Dr Solum in [place]), is known to have primary hyperparathyroidism.  Subjective: No significant events overnight, he denies any complaints  Assessment & Plan:   Principal Problem:   Altered mental status, unspecified Active Problems:   Schizoaffective disorder, bipolar type (HCC)   Sepsis secondary to UTI [facility])   UTI (urinary tract infection)   Acute metabolic encephalopathy   Proteus mirabilis infection   Bacteremia   Sepsis due to Gram negative bacteria (HCC)    Acute metabolic encephalopathy - In setting of acute infection in setting of underlying dementia secondary to neurosyphilis. -  MRI was negative for acute finding that could explain his mental status change. -  EEG without evidence of seizure activity.  -He was improving initially, but he had a setback where he became encephalopathic again due to his hyponatremia and hypercalcemia, after correction of his hypercalcemia and hyponatremia, appears he is improving more awake and appropriate. - I have discussed with his legal guardian, apparently he is talkative, ambulate and was able to feed himself .  -Does have significant improvement of mental status after stopping his psych medication including amantadine, Depakote and gabapentin so I will continue to  hold .  Hyperparathyroidism/hypercalcemia -Being followed by endocrinology Dr. Gabriel Carina at [facility], ultrasound as an outpatient does showing left superior adenoma most likely the cause of his primary hyperparathyroidism, apparently his PTH level steadily increasing , calcium peaked at 13.9, he did require calcitonin and Zometa, calcium improving, is 11.2 today. -Have discussed with Dr. Gabriel Carina, think at this point he needs parathyroidectomy, work-up significant for left upper thyroid lobe adenoma on ultrasound , sestamibi scan during hospital stay as well confirms left superior parathyroid adenoma , surgery input greatly appreciated, plan for adenoma removal Wednesday or Thursday .   Sepsis/Bacteremia/UTI secondary to Proteus Mirabilis - Sensitive to ceftriaxone.  Completed the course - Physiology of sepsis has resolved  Hypernatremia - Worsened with change in mental status.  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  Schizoaffective disorder Psychiatry consulted for evaluation. -given encephalopathy and lethargy, stopped his meds  Dysphagia - Possibly related to underlying dementia and schizophrenia vs metabolic encephalopathy from infection. -Continue IV fluids -followed by SLP -Continue to encourage oral intake  Acute kidney injury - Resolved.  Essential hypertension -Labile, overall acceptable, continue with losartan -I have held losartan this a.m., given hyperkalemia, 5.6but it is hemolyzed, repeat level is 4.1, he will be resumed back on losartan  Thrombocytopenia Chronic. Improved slightly, but otherwise stable.  Hypokalemia Resolved with supplementation   DVT prophylaxis: Lovenox Code Status:   Code Status: Full Code Family Communication: Discussed with legal guardian via phone  05/05/2018 Disposition Plan: Discharge to SNF when medically stable   Consultants:   Neurology  General  surgery   Procedures:   EEG (5/16) IMPRESSION:  This is an abnormal EEG demonstrating a mild to moderate diffuse slowing of electrocerebral activity.  This can be seen in a wide variety of encephalopathic state including those of a toxic, metabolic, or degenerative nature.  There were no focal, hemispheric, or lateralizing features.  No epileptiform activity was recorded.   Antimicrobials:  Levaquin (5/11)  Aztreonam (5/11>>5/12)  Ceftriaxone (5/12>>    Objective: Vitals:   05/09/18 0022 05/09/18 0404 05/09/18 0834 05/09/18 1139  BP: 121/84 140/81 138/81 107/68  Pulse: 99 98 93 (!) 117  Resp: (!) 22 (!) 22 20   Temp: 98.8 F (37.1 C) 98.2 F (36.8 C) 97.7 F (36.5 C) 98.4 F (36.9 C)  TempSrc: Oral Oral Oral Oral  SpO2: 99% 98% 96% 97%  Weight:      Height:        Intake/Output Summary (Last 24 hours) at 05/09/2018 1442 Last data filed at 05/09/2018 1302 Gross per 24 hour  Intake 3433.33 ml  Output 500 ml  Net 2933.33 ml   Filed Weights   04/23/18 1429 04/23/18 2025 04/24/18 0548  Weight: 90.7 kg (200 lb) 98.2 kg (216 lb 7.9 oz) 98.6 kg (217 lb 6 oz)    Examination:  Awake, alert, pleasant, eating breakfast, in no apparent distress  Good air entry bilaterally, no wheezing rales rhonchi  Regular rate and rhythm, no rubs murmurs gallops  Abdomen soft, nontender, nondistended, bowel sounds presents  Extremity with no edema, clubbing or cyanosis .   Data Reviewed: I have personally reviewed following labs and imaging studies  CBC: Recent Labs  Lab 05/05/18 0237 05/06/18 0411 05/07/18 0617  WBC 10.5 9.8 7.6  HGB 14.2 13.5 12.8*  HCT 46.2 42.6 40.4  MCV 89.4 87.1 86.3  PLT 172 154 836*   Basic Metabolic Panel: Recent Labs  Lab 05/06/18 0411 05/06/18 1755 05/07/18 0251 05/07/18 0617 05/08/18 0414 05/09/18 0348  NA 148* 144 144  --   141 143  K 3.6 3.4* 5.6* 4.1 3.6 3.6  CL 116* 113* 116*  --  111 112*  CO2 27 25 23   --  25 26  GLUCOSE 101* 91 109*  --  86 132*  BUN 15 17 17   --  12 16  CREATININE 0.85 0.91 0.88  --  0.74 0.82  CALCIUM 11.9* 11.5* 11.5*  --  11.0* 11.2*   GFR: Estimated Creatinine Clearance: 94.6 mL/min (by C-G formula based on SCr of 0.82 mg/dL). Liver Function Tests: Recent Labs  Lab 05/03/18 1149  ALBUMIN 2.3*   No results for input(s): LIPASE, AMYLASE in the last 168 hours. No results for input(s): AMMONIA in the last 168 hours. Coagulation Profile: No results for input(s): INR, PROTIME in the last 168 hours. Cardiac Enzymes: No results for input(s): CKTOTAL, CKMB, CKMBINDEX, TROPONINI in the last 168 hours. BNP (last 3 results) No results for input(s): PROBNP in the last 8760 hours. HbA1C: No results for input(s): HGBA1C in the last 72 hours. CBG: Recent Labs  Lab 05/07/18 2228 05/08/18 0627 05/08/18 1150 05/08/18 2201 05/09/18 1112  GLUCAP 72 120* 97 110* 103*   Lipid Profile: No results for input(s): CHOL, HDL, LDLCALC, TRIG, CHOLHDL, LDLDIRECT in the last 72 hours. Thyroid Function Tests: No results for input(s): TSH, T4TOTAL, FREET4, T3FREE, THYROIDAB in the last 72 hours. Anemia Panel: No results for input(s): VITAMINB12, FOLATE, FERRITIN, TIBC, IRON, RETICCTPCT in the last 72 hours. Sepsis Labs: No results for input(s): PROCALCITON,  LATICACIDVEN in the last 168 hours.  No results found for this or any previous visit (from the past 240 hour(s)).       Radiology Studies: No results found.      Scheduled Meds: . feeding supplement (ENSURE ENLIVE)  237 mL Oral TID BM  . hydrALAZINE  10 mg Intravenous Q6H  . latanoprost  1 drop Both Eyes QHS  . losartan  25 mg Oral Daily   Continuous Infusions: . dextrose 50 mL/hr at 05/09/18 1349     LOS: 16 days     Phillips Climes, MD Pager [phone] [facility] 05/09/2018, 2:42 PM Pager: [phone]  If 7PM-7AM, please contact night-coverage [email or web address] 05/09/2018, 2:42 PM

## 2018-05-10 ENCOUNTER — Encounter (HOSPITAL_COMMUNITY): Payer: Self-pay | Admitting: Anesthesiology

## 2018-05-10 LAB — BASIC METABOLIC PANEL
ANION GAP: 4 — AB (ref 5–15)
BUN: 13 mg/dL (ref 6–20)
CO2: 28 mmol/L (ref 22–32)
Calcium: 11.5 mg/dL — ABNORMAL HIGH (ref 8.9–10.3)
Chloride: 110 mmol/L (ref 101–111)
Creatinine, Ser: 0.82 mg/dL (ref 0.61–1.24)
GFR calc Af Amer: >60 mL/min (ref >60)
GLUCOSE: 100 mg/dL — AB (ref 65–99)
POTASSIUM: 3.9 mmol/L (ref 3.5–5.1)
SODIUM: 142 mmol/L (ref 135–145)

## 2018-05-10 LAB — GLUCOSE, CAPILLARY
GLUCOSE-CAPILLARY: 98 mg/dL (ref 65–99)
GLUCOSE-CAPILLARY: 125 mg/dL — AB (ref 65–99)
Glucose-Capillary: 116 mg/dL — ABNORMAL HIGH (ref 65–99)
Glucose-Capillary: 103 mg/dL — ABNORMAL HIGH (ref 65–99)

## 2018-05-10 MED ORDER — KCL IN DEXTROSE-NACL 20-5-0.9 MEQ/L-%-% IV SOLN
INTRAVENOUS | Status: DC
  Administered 2018-05-10: 22:00:00 via INTRAVENOUS
  Filled 2018-05-10 (×2): qty 1000

## 2018-05-10 NOTE — Progress Notes (Signed)
Physical Therapy Treatment Patient Details Name: Henry Smith MRN: [id number] DOB: 01/07/1941 Today's Date: 05/10/2018    History of Present Illness Pt is a 77 y.o. male admitted 04/23/18 with AMS, including non-verbal, not walking and R-side facial droop. Worked up for encephalopathy secondary to sepsis due to UTI, AKI. Head CT and MRI show no acute intracranial finding; mild small vessel change normal for age. PMH includes dementia, schizophrenia, prostate CA.    PT Comments    Patient is making progress toward mobility goals and much more participatory and communicative this session. Pt required min guard for bed mobility and min/mod A +2 for safety with STS and STP transfers. Pt will continue to benefit from further skilled PT services both acute and post acute to maximize independence and safety with mobility.    Follow Up Recommendations  SNF;Supervision/Assistance - 24 hour     Equipment Recommendations  (TBD)    Recommendations for Other Services       Precautions / Restrictions Precautions Precautions: Fall Restrictions Weight Bearing Restrictions: No    Mobility  Bed Mobility Overal bed mobility: Needs Assistance Bed Mobility: Supine to Sit     Supine to sit: Min guard     General bed mobility comments: increased time and effort; min guard for safety  Transfers Overall transfer level: Needs assistance Equipment used: 2 person hand held assist Transfers: Sit to/from [employer] Transfers Sit to Stand: Mod assist;+2 safety/equipment Stand pivot transfers: Min assist;+2 safety/equipment       General transfer comment: pt stood intially from EOB with +2 for safety and able to stand pivot to recliner with cues for sequencing; pt then able to stand with mod A +1 from recliner and maintain standing balance with min A two trials ~3 mins  Ambulation/Gait                 Stairs             Wheelchair Mobility    Modified Rankin (Stroke  Patients Only)       Balance Overall balance assessment: Needs assistance Sitting-balance support: Bilateral upper extremity supported;Feet supported Sitting balance-Leahy Scale: Fair     Standing balance support: Bilateral upper extremity supported Standing balance-Leahy Scale: Poor                              Cognition Arousal/Alertness: Awake/alert Behavior During Therapy: Flat affect Overall Cognitive Status: Impaired/Different from baseline Area of Impairment: Following commands;Awareness;Problem solving;Orientation;Attention                 Orientation Level: Disoriented to;Time;Situation(able to state he is in the hospital) Current Attention Level: Sustained   Following Commands: Follows multi-step commands with increased time   Awareness: Intellectual Problem Solving: Slow processing;Decreased initiation;Requires verbal cues General Comments: pt was more communicative today and answering all questions approriately and smiling when therapist joked with him and participatory this session.       Exercises      General Comments General comments (skin integrity, edema, etc.): pt soiled in urine upon arrival and required assistance to clean up prior to transfer to recliner      Pertinent Vitals/Pain Pain Assessment: No/denies pain    Home Living                      Prior Function            PT Goals (current goals can  now be found in the care plan section) Acute Rehab PT Goals Patient Stated Goal: Unstated PT Goal Formulation: Patient unable to participate in goal setting Time For Goal Achievement: 05/15/18 Potential to Achieve Goals: Fair Progress towards PT goals: Progressing toward goals    Frequency    Min 2X/week      PT Plan Current plan remains appropriate    Co-evaluation              AM-PAC PT "6 Clicks" Daily Activity  Outcome Measure  Difficulty turning over in bed (including adjusting bedclothes,  sheets and blankets)?: A Lot Difficulty moving from lying on back to sitting on the side of the bed? : A Lot Difficulty sitting down on and standing up from a chair with arms (e.g., wheelchair, bedside commode, etc,.)?: Unable Help needed moving to and from a bed to chair (including a wheelchair)?: A Lot Help needed walking in hospital room?: A Lot Help needed climbing 3-5 steps with a railing? : Total 6 Click Score: 10    End of Session Equipment Utilized During Treatment: Gait belt Activity Tolerance: Patient tolerated treatment well Patient left: with call bell/phone within reach;in chair;with chair alarm set Nurse Communication: Mobility status PT Visit Diagnosis: Other abnormalities of gait and mobility (R26.89);Other symptoms and signs involving the nervous system (R29.898)     Time: 1526-1550 PT Time Calculation (min) (ACUTE ONLY): 24 min  Charges:  $Therapeutic Activity: 23-37 mins                    G Codes:       Earney Navy, PTA Pager: [phone]     Darliss Cheney 05/10/2018, 4:39 PM

## 2018-05-10 NOTE — Progress Notes (Signed)
    CC:  AMS  Subjective: Pt alert this AM.    Objective: Vital signs in last 24 hours: Temp:  [97.5 F (36.4 C)-98.4 F (36.9 C)] 98 F (36.7 C) (05/28 0454) Pulse Rate:  [88-117] 88 (05/28 0811) Resp:  [18-20] 18 (05/28 0811) BP: (106-149)/(68-95) 149/95 (05/28 0811) SpO2:  [94 %-100 %] 96 % (05/28 0811) Last BM Date: 05/07/18  Intake/Output from previous day: 05/27 0701 - 05/28 0700 In: 2657.1 [P.O.:1080; I.V.:1577.1] Out: 1100 [Urine:1100] Intake/Output this shift: No intake/output data recorded.  General appearance: alert, cooperative and no distress Neck: no adenopathy, no carotid bruit, no JVD, supple, symmetrical, trachea midline and thyroid not enlarged, symmetric, no tenderness/mass/nodules Resp: clear to auscultation bilaterally GI: soft, non-tender; bowel sounds normal; no masses,  no organomegaly  Lab Results:  No results for input(s): WBC, HGB, HCT, PLT in the last 72 hours.  BMET Recent Labs    05/09/18 0348 05/10/18 0557  NA 143 142  K 3.6 3.9  CL 112* 110  CO2 26 28  GLUCOSE 132* 100*  BUN 16 13  CREATININE 0.82 0.82  CALCIUM 11.2* 11.5*   PT/INR No results for input(s): LABPROT, INR in the last 72 hours.  Recent Labs  Lab 05/03/18 1149  ALBUMIN 2.3*     Lipase     Component Value Date/Time   LIPASE 22 05/22/2017 1153     Medications: . feeding supplement (ENSURE ENLIVE)  237 mL Oral TID BM  . hydrALAZINE  10 mg Intravenous Q6H  . latanoprost  1 drop Both Eyes QHS  . losartan  25 mg Oral Daily    Assessment/Plan Sepsis with Proteus bacteremia/UTI Hypernatremia Acute metabolic encephalopathy Dementia associate with neurosyphilis, Schizophrenia Hx of prostate cancer Dysphagia Hypertension Thrombocytopenia    Hyperparathyroid with elevated calcium - medically treated LEFT superior parathyroid adenomaper Sestamibi   FEN: IV fluids PRN/Dysphagia II ID: None DVT: None Follow up: TBD  Plan:  Aiming for Left  parathyroidectomy tomorrow.            LOS: 17 days    Henry Smith 05/10/2018 [phone]

## 2018-05-10 NOTE — Anesthesia Preprocedure Evaluation (Addendum)
Anesthesia Evaluation  Patient identified by MRN, date of birth, ID band Patient awake    Reviewed: Allergy & Precautions, NPO status , Patient's Chart, lab work & pertinent test results  Airway Mallampati: III  TM Distance: >3 FB Neck ROM: Full    Dental no notable dental hx. (+) Poor Dentition   Pulmonary Current Smoker,    Pulmonary exam normal breath sounds clear to auscultation       Cardiovascular hypertension, Pt. on medications Normal cardiovascular exam Rhythm:Regular Rate:Normal  EKG- RBBB + LAFB   Neuro/Psych PSYCHIATRIC DISORDERS Bipolar Disorder Schizophrenia Dementia Hx/o neurosyphilis Gait disturbance Altered mental status    GI/Hepatic negative GI ROS, Neg liver ROS,   Endo/Other  Obesity Left superior parathyroid adenoma Hypercalcemia  Renal/GU negative Renal ROS   Prostate Ca    Musculoskeletal negative musculoskeletal ROS (+)   Abdominal (+) + obese,   Peds  Hematology Hx/o gm - sepsis Thrombocytopenia    Anesthesia Other Findings   Reproductive/Obstetrics                          Anesthesia Physical Anesthesia Plan  ASA: III  Anesthesia Plan: General   Post-op Pain Management:    Induction: Intravenous  PONV Risk Score and Plan: 2 and Ondansetron, Dexamethasone and Treatment may vary due to age or medical condition  Airway Management Planned: Oral ETT  Additional Equipment:   Intra-op Plan:   Post-operative Plan: Extubation in OR  Informed Consent: I have reviewed the patients History and Physical, chart, labs and discussed the procedure including the risks, benefits and alternatives for the proposed anesthesia with the patient or authorized representative who has indicated his/her understanding and acceptance.   Dental advisory given  Plan Discussed with: CRNA, Anesthesiologist and Surgeon  Anesthesia Plan Comments:        Anesthesia Quick  Evaluation

## 2018-05-10 NOTE — Progress Notes (Signed)
PROGRESS NOTE  Henry Smith PJK:[id number] DOB: 05-13-1941 DOA: 04/23/2018 PCP: Caprice Renshaw, MD   LOS: 17 days   Brief Narrative / Interim history: Henry Smith is a 77 y.o. male with a history of schizophrenia, bipolar disorder, dementia associate with neurosyphilis, prostate cancer, hypertension.  Patient presented secondary to worsening mental status, including becoming nonverbal with facial droop.  Initial concern for stroke, however, MRI was negative.  He was found to have a UTI with associated bacteremia and is currently on antibiotic therapy. Hospital stay was complicated by hypernatremia and encephalopathy in the setting of hypercalcemia.  Patient is followed by endocrinology in [place] (Dr. Gabriel Carina) and has been diagnosed with primary hyperparathyroidism.  Assessment & Plan: Principal Problem:   Altered mental status, unspecified Active Problems:   Schizoaffective disorder, bipolar type (HCC)   Sepsis secondary to UTI [facility])   UTI (urinary tract infection)   Acute metabolic encephalopathy   Proteus mirabilis infection   Bacteremia   Sepsis due to Gram negative bacteria (HCC)   Acute metabolic encephalopathy -In setting of acute infection in setting of underlying dementia secondary to neurosyphilis. -MRI was negative for acute finding that could explain his mental status change. -EEG without evidence of seizure activity.  -He was improving initially, but he had a setback where he became encephalopathic again due to his hyponatremia and hypercalcemia, after correction of his hypercalcemia and hyponatremia, appears he is improving more awake and appropriate. -Does have significant improvement of mental status after stopping his psych medication including amantadine, Depakote and gabapentin, continue to hold  Hyperparathyroidism/hypercalcemia -Being followed by endocrinology Dr. Gabriel Carina at [facility], ultrasound as an outpatient does showing left superior adenoma most  likely the cause of his primary hyperparathyroidism, apparently his PTH level steadily increasing, calcium peaked at 13.9, he did require calcitonin and Zometa, calcium appears to be improving however albumin not checked, will repeat calcium along with albumin levels tomorrow morning -Dr. Roseanne Kaufman discussed with Dr. Gabriel Carina, think at this point he needs parathyroidectomy, work-up significant for left upper thyroid lobe adenoma on ultrasound , sestamibi scan during hospital stay as well confirms left superior parathyroid adenoma , surgery input greatly appreciated -Plan for parathyroidectomy 5/29  Sepsis/Bacteremia/UTI secondary to Proteus Mirabilis -Sensitive to ceftriaxone. Completed the course -Sepsis physiology resolved, he is afebrile, will repeat CBC tomorrow morning  Hypernatremia -Worsened with change in mental status.  Most likely nephrogenic DI in the setting of hypercalcemia, hypercalcemia has improved his hyponatremia has improved as well,  sodium is 142 today, continue D5, currently 100/h  Schizoaffective disorder Psychiatry consulted for evaluation. -given encephalopathy and lethargy, stopped his meds  Dysphagia -Possibly related to underlying dementia and schizophrenia vs metabolic encephalopathy from infection. -Continue IV fluids -followed by SLP -Continue to encourage oral intake  Acute kidney injury -Resolved.  Essential hypertension -Labile, overall acceptable, continue with losartan -Blood pressure stable, continue losartan and hydralazine  Thrombocytopenia -Chronic. Improved slightly, but otherwise stable.  Repeat CBC tomorrow morning  Hypokalemia -Resolved with supplementation    DVT prophylaxis: Lovenox Code Status: Full code Family Communication: no family at bedside Disposition Plan: SNF when stable  Consultants:   Neurology   General surgery   Procedures:   EEG IMPRESSION:  This is an abnormal EEG demonstrating amild to  moderatediffuse slowing of electrocerebral activity. This can be seen in a wide variety of encephalopathic state including those of a toxic, metabolic, or degenerative nature. There were no focal, hemispheric, or lateralizing features. No epileptiform activity was recorded.  Antimicrobials:  Levaquin (5/11)  Aztreonam (5/11>>5/12)  Ceftriaxone (5/12>>5/22  Subjective: - no chest pain, shortness of breath, no abdominal pain, nausea or vomiting.   Objective: Vitals:   05/10/18 0454 05/10/18 0811 05/10/18 1143 05/10/18 1418  BP: 106/73 (!) 149/95 140/85 111/67  Pulse: 91 88 94 (!) 107  Resp: 18 18 18    Temp: 98 F (36.7 C)  97.9 F (36.6 C) 98 F (36.7 C)  TempSrc: Oral  Oral Oral  SpO2: 98% 96% 99% 96%  Weight:      Height:        Intake/Output Summary (Last 24 hours) at 05/10/2018 1437 Last data filed at 05/10/2018 1300 Gross per 24 hour  Intake 2177.08 ml  Output 800 ml  Net 1377.08 ml   Filed Weights   04/23/18 1429 04/23/18 2025 04/24/18 0548  Weight: 90.7 kg (200 lb) 98.2 kg (216 lb 7.9 oz) 98.6 kg (217 lb 6 oz)    Examination:  Constitutional: NAD Eyes: lids and conjunctivae normal ENMT: Mucous membranes are moist.  Neck: normal, supple Respiratory: clear to auscultation bilaterally, no wheezing, no crackles. Normal respiratory effort.  Cardiovascular: Regular rate and rhythm, no murmurs / rubs / gallops. No LE edema. Abdomen: no tenderness. Bowel sounds positive.  Skin: no rashes Neurologic: non focal    Data Reviewed: I have independently reviewed following labs and imaging studies   CBC: Recent Labs  Lab 05/05/18 0237 05/06/18 0411 05/07/18 0617  WBC 10.5 9.8 7.6  HGB 14.2 13.5 12.8*  HCT 46.2 42.6 40.4  MCV 89.4 87.1 86.3  PLT 172 154 245*   Basic Metabolic Panel: Recent Labs  Lab 05/06/18 1755 05/07/18 0251 05/07/18 0617 05/08/18 0414 05/09/18 0348 05/10/18 0557  NA 144 144  --  141 143 142  K 3.4* 5.6* 4.1 3.6 3.6 3.9  CL 113*  116*  --  111 112* 110  CO2 25 23  --  25 26 28   GLUCOSE 91 109*  --  86 132* 100*  BUN 17 17  --  12 16 13   CREATININE 0.91 0.88  --  0.74 0.82 0.82  CALCIUM 11.5* 11.5*  --  11.0* 11.2* 11.5*   GFR: Estimated Creatinine Clearance: 94.6 mL/min (by C-G formula based on SCr of 0.82 mg/dL). Liver Function Tests: No results for input(s): AST, ALT, ALKPHOS, BILITOT, PROT, ALBUMIN in the last 168 hours. No results for input(s): LIPASE, AMYLASE in the last 168 hours. No results for input(s): AMMONIA in the last 168 hours. Coagulation Profile: No results for input(s): INR, PROTIME in the last 168 hours. Cardiac Enzymes: No results for input(s): CKTOTAL, CKMB, CKMBINDEX, TROPONINI in the last 168 hours. BNP (last 3 results) No results for input(s): PROBNP in the last 8760 hours. HbA1C: No results for input(s): HGBA1C in the last 72 hours. CBG: Recent Labs  Lab 05/09/18 1112 05/09/18 1559 05/09/18 2145 05/10/18 0613 05/10/18 1049  GLUCAP 103* 103* 77 98 125*   Lipid Profile: No results for input(s): CHOL, HDL, LDLCALC, TRIG, CHOLHDL, LDLDIRECT in the last 72 hours. Thyroid Function Tests: No results for input(s): TSH, T4TOTAL, FREET4, T3FREE, THYROIDAB in the last 72 hours. Anemia Panel: No results for input(s): VITAMINB12, FOLATE, FERRITIN, TIBC, IRON, RETICCTPCT in the last 72 hours. Urine analysis:    Component Value Date/Time   COLORURINE YELLOW 04/23/2018 1540   APPEARANCEUR TURBID (A) 04/23/2018 1540   LABSPEC 1.021 04/23/2018 1540   PHURINE 5.0 04/23/2018 1540   GLUCOSEU 50 (A) 04/23/2018 1540   HGBUR LARGE (A) 04/23/2018 1540  BILIRUBINUR NEGATIVE 04/23/2018 1540   KETONESUR NEGATIVE 04/23/2018 1540   PROTEINUR 100 (A) 04/23/2018 1540   NITRITE NEGATIVE 04/23/2018 1540   LEUKOCYTESUR MODERATE (A) 04/23/2018 1540   Sepsis Labs: Invalid input(s): PROCALCITONIN, LACTICIDVEN  No results found for this or any previous visit (from the past 240 hour(s)).     Radiology Studies: No results found.   Scheduled Meds: . feeding supplement (ENSURE ENLIVE)  237 mL Oral TID BM  . hydrALAZINE  10 mg Intravenous Q6H  . latanoprost  1 drop Both Eyes QHS  . losartan  25 mg Oral Daily   Continuous Infusions: . dextrose 5 % and 0.9 % NaCl with KCl 20 mEq/L    . dextrose 75 mL/hr at 05/10/18 0502    Marzetta Board, MD, PhD [facility] Pager [phone]  If 7PM-7AM, please contact night-coverage [email or web address] Password [facility] 05/10/2018, 2:37 PM

## 2018-05-10 NOTE — Progress Notes (Addendum)
  Speech Language Pathology Treatment: Dysphagia  Patient Details Name: Henry Smith MRN: [id number] DOB: 26-Aug-1941 Today's Date: 05/10/2018 Time: 6945-0388 SLP Time Calculation (min) (ACUTE ONLY): 9 min  Assessment / Plan / Recommendation Clinical Impression  Skilled treatment session focused on dysphagia goals. SLP facilitated session by providing skilled observation of pt consuming trial of dysphagia 3. Pt continues with decreased lingual manipulation of bolus which leads to significant increased oral phase placing pt at risk of aspiration. When offered liquid wash, pt with difficulty managing liquid and solid as evidenced by increased wet vocal quality. Therefore recommend pt continue current diet of dysphagia 2 with thin liquids. Due to decreased ability to demonstrate effective consumption of trials of advanced textures, ST to sign off at this time.     HPI HPI: Pt is a 77 y.o. male admitted 04/23/18 with AMS, including non-verbal, not walking and R-side facial droop. Worked up for encephalopathy secondary to sepsis due to UTI, AKI. Head CT and MRI show no acute intracranial finding; mild small vessel change normal for age. PMH includes dementia, schizophrenia, prostate CA      SLP Plan  Discharge SLP treatment due to (comment)(has not demonstrated safety with trials of dysphagia 3)       Recommendations  Diet recommendations: Dysphagia 2 (fine chop);Thin liquid Liquids provided via: Cup;Straw Medication Administration: Whole meds with puree Supervision: Full supervision/cueing for compensatory strategies Compensations: Follow solids with liquid;Minimize environmental distractions Postural Changes and/or Swallow Maneuvers: Seated upright 90 degrees                Oral Care Recommendations: Oral care BID Follow up Recommendations: Skilled Nursing facility SLP Visit Diagnosis: Dysphagia, oropharyngeal phase (R13.12) Plan: Discharge SLP treatment due to (comment)(has not  demonstrated safety with trials of dysphagia 3)       GO                Henry Smith 05/10/2018, 2:23 PM

## 2018-05-11 ENCOUNTER — Encounter (HOSPITAL_COMMUNITY): Payer: Self-pay | Admitting: Certified Registered"

## 2018-05-11 ENCOUNTER — Encounter (HOSPITAL_COMMUNITY)
Admission: EM | Disposition: A | Discharge status: Skilled Nursing Facility | Payer: Self-pay | Source: Home / Self Care | Attending: Family Medicine

## 2018-05-11 ENCOUNTER — Inpatient Hospital Stay (HOSPITAL_COMMUNITY): Payer: Medicare Other | Admitting: Anesthesiology

## 2018-05-11 HISTORY — PX: PARATHYROIDECTOMY: SHX19

## 2018-05-11 LAB — GLUCOSE, CAPILLARY
GLUCOSE-CAPILLARY: 111 mg/dL — AB (ref 65–99)
Glucose-Capillary: 110 mg/dL — ABNORMAL HIGH (ref 65–99)
Glucose-Capillary: 148 mg/dL — ABNORMAL HIGH (ref 65–99)
Glucose-Capillary: 109 mg/dL — ABNORMAL HIGH (ref 65–99)

## 2018-05-11 LAB — COMPREHENSIVE METABOLIC PANEL
ALBUMIN: 2.5 g/dL — AB (ref 3.5–5.0)
ALT: 53 U/L (ref 17–63)
AST: 31 U/L (ref 15–41)
Alkaline Phosphatase: 75 U/L (ref 38–126)
Anion gap: 6 (ref 5–15)
BUN: 17 mg/dL (ref 6–20)
CHLORIDE: 110 mmol/L (ref 101–111)
CO2: 26 mmol/L (ref 22–32)
Calcium: 11.7 mg/dL — ABNORMAL HIGH (ref 8.9–10.3)
Creatinine, Ser: 0.76 mg/dL (ref 0.61–1.24)
GFR calc Af Amer: >60 mL/min (ref >60)
GFR calc non Af Amer: >60 mL/min (ref >60)
GLUCOSE: 109 mg/dL — AB (ref 65–99)
POTASSIUM: 4.6 mmol/L (ref 3.5–5.1)
Sodium: 142 mmol/L (ref 135–145)
Total Bilirubin: 0.8 mg/dL (ref 0.3–1.2)
Total Protein: 6.4 g/dL — ABNORMAL LOW (ref 6.5–8.1)

## 2018-05-11 LAB — CBC
HEMATOCRIT: 39.1 % (ref 39.0–52.0)
Hemoglobin: 12.4 g/dL — ABNORMAL LOW (ref 13.0–17.0)
MCH: 27.3 pg (ref 26.0–34.0)
MCHC: 31.7 g/dL (ref 30.0–36.0)
MCV: 86.1 fL (ref 78.0–100.0)
PLATELETS: 139 10*3/uL — AB (ref 150–400)
RBC: 4.54 MIL/uL (ref 4.22–5.81)
RDW: 15.8 % — AB (ref 11.5–15.5)
WBC: 5.4 10*3/uL (ref 4.0–10.5)

## 2018-05-11 LAB — PROTIME-INR
INR: 1.08
Prothrombin Time: 14 seconds (ref 11.4–15.2)

## 2018-05-11 LAB — CALCIUM: Calcium: 11.2 mg/dL — ABNORMAL HIGH (ref 8.9–10.3)

## 2018-05-11 SURGERY — PARATHYROIDECTOMY
Anesthesia: General | Site: Neck | Laterality: Left

## 2018-05-11 MED ORDER — BUPIVACAINE HCL (PF) 0.25 % IJ SOLN
INTRAMUSCULAR | Status: AC
  Filled 2018-05-11: qty 30

## 2018-05-11 MED ORDER — HYDROMORPHONE HCL 2 MG/ML IJ SOLN: 0.2500 mg | INTRAMUSCULAR | Status: DC | PRN

## 2018-05-11 MED ORDER — SUGAMMADEX SODIUM 200 MG/2ML IV SOLN
INTRAVENOUS | Status: AC
  Filled 2018-05-11: qty 2

## 2018-05-11 MED ORDER — ONDANSETRON HCL 4 MG/2ML IJ SOLN: 4.0000 mg | Freq: Once | INTRAMUSCULAR | Status: DC | PRN

## 2018-05-11 MED ORDER — ESMOLOL HCL 100 MG/10ML IV SOLN
INTRAVENOUS | Status: DC | PRN
  Administered 2018-05-11: 10 mg via INTRAVENOUS
  Administered 2018-05-11: 30 mg via INTRAVENOUS
  Administered 2018-05-11: 10 mg via INTRAVENOUS
  Administered 2018-05-11: 30 mg via INTRAVENOUS
  Administered 2018-05-11: 20 mg via INTRAVENOUS

## 2018-05-11 MED ORDER — LIDOCAINE HCL (CARDIAC) PF 100 MG/5ML IV SOSY
PREFILLED_SYRINGE | INTRAVENOUS | Status: DC | PRN
  Administered 2018-05-11: 100 mg via INTRAVENOUS

## 2018-05-11 MED ORDER — SUGAMMADEX SODIUM 200 MG/2ML IV SOLN
INTRAVENOUS | Status: DC | PRN
  Administered 2018-05-11: 200 mg via INTRAVENOUS

## 2018-05-11 MED ORDER — FENTANYL CITRATE (PF) 100 MCG/2ML IJ SOLN
INTRAMUSCULAR | Status: DC | PRN
  Administered 2018-05-11 (×2): 100 ug via INTRAVENOUS
  Administered 2018-05-11: 50 ug via INTRAVENOUS

## 2018-05-11 MED ORDER — DEXAMETHASONE SODIUM PHOSPHATE 10 MG/ML IJ SOLN
INTRAMUSCULAR | Status: DC | PRN
  Administered 2018-05-11: 10 mg via INTRAVENOUS

## 2018-05-11 MED ORDER — PROPOFOL 10 MG/ML IV BOLUS
INTRAVENOUS | Status: DC | PRN
  Administered 2018-05-11: 160 mg via INTRAVENOUS

## 2018-05-11 MED ORDER — LACTATED RINGERS IV SOLN
INTRAVENOUS | Status: DC | PRN
  Administered 2018-05-11 (×2): via INTRAVENOUS

## 2018-05-11 MED ORDER — BUPIVACAINE-EPINEPHRINE (PF) 0.25% -1:200000 IJ SOLN
INTRAMUSCULAR | Status: AC
  Filled 2018-05-11: qty 30

## 2018-05-11 MED ORDER — ROCURONIUM BROMIDE 10 MG/ML (PF) SYRINGE
PREFILLED_SYRINGE | INTRAVENOUS | Status: AC
  Filled 2018-05-11: qty 5

## 2018-05-11 MED ORDER — SODIUM CHLORIDE 0.9 % IV SOLN
1500.0000 mg | INTRAVENOUS | Status: AC
  Administered 2018-05-11: 1500 mg via INTRAVENOUS
  Filled 2018-05-11: qty 1500

## 2018-05-11 MED ORDER — CALCIUM CARBONATE-VITAMIN D 500-200 MG-UNIT PO TABS
2.0000 | ORAL_TABLET | Freq: Three times a day (TID) | ORAL | Status: DC
  Administered 2018-05-11 – 2018-05-12 (×6): 2 via ORAL
  Filled 2018-05-11 (×6): qty 2

## 2018-05-11 MED ORDER — ONDANSETRON HCL 4 MG/2ML IJ SOLN
INTRAMUSCULAR | Status: DC | PRN
  Administered 2018-05-11: 4 mg via INTRAVENOUS

## 2018-05-11 MED ORDER — MEPERIDINE HCL 50 MG/ML IJ SOLN: 6.2500 mg | INTRAMUSCULAR | Status: DC | PRN

## 2018-05-11 MED ORDER — DEXAMETHASONE SODIUM PHOSPHATE 10 MG/ML IJ SOLN
INTRAMUSCULAR | Status: AC
  Filled 2018-05-11: qty 1

## 2018-05-11 MED ORDER — ONDANSETRON HCL 4 MG/2ML IJ SOLN
INTRAMUSCULAR | Status: AC
  Filled 2018-05-11: qty 2

## 2018-05-11 MED ORDER — PHENYLEPHRINE 40 MCG/ML (10ML) SYRINGE FOR IV PUSH (FOR BLOOD PRESSURE SUPPORT)
PREFILLED_SYRINGE | INTRAVENOUS | Status: AC
  Filled 2018-05-11: qty 10

## 2018-05-11 MED ORDER — 0.9 % SODIUM CHLORIDE (POUR BTL) OPTIME
TOPICAL | Status: DC | PRN
  Administered 2018-05-11: 1000 mL

## 2018-05-11 MED ORDER — PROPOFOL 10 MG/ML IV BOLUS
INTRAVENOUS | Status: AC
  Filled 2018-05-11: qty 20

## 2018-05-11 MED ORDER — MAGNESIUM OXIDE 400 (241.3 MG) MG PO TABS
400.0000 mg | ORAL_TABLET | Freq: Every day | ORAL | Status: DC
  Administered 2018-05-11 – 2018-05-12 (×2): 400 mg via ORAL
  Filled 2018-05-11 (×3): qty 1

## 2018-05-11 MED ORDER — TRAMADOL HCL 50 MG PO TABS
50.0000 mg | ORAL_TABLET | Freq: Four times a day (QID) | ORAL | Status: DC | PRN
  Administered 2018-05-11 – 2018-05-12 (×3): 50 mg via ORAL
  Filled 2018-05-11 (×3): qty 1

## 2018-05-11 MED ORDER — HEMOSTATIC AGENTS (NO CHARGE) OPTIME
TOPICAL | Status: DC | PRN
  Administered 2018-05-11: 1 via TOPICAL

## 2018-05-11 MED ORDER — ALBUMIN HUMAN 5 % IV SOLN
INTRAVENOUS | Status: DC | PRN
  Administered 2018-05-11: 08:00:00 via INTRAVENOUS

## 2018-05-11 MED ORDER — ROCURONIUM BROMIDE 100 MG/10ML IV SOLN
INTRAVENOUS | Status: DC | PRN
  Administered 2018-05-11: 50 mg via INTRAVENOUS

## 2018-05-11 MED ORDER — PHENYLEPHRINE HCL 10 MG/ML IJ SOLN
INTRAMUSCULAR | Status: DC | PRN
  Administered 2018-05-11 (×2): 120 ug via INTRAVENOUS
  Administered 2018-05-11: 40 ug via INTRAVENOUS
  Administered 2018-05-11: 120 ug via INTRAVENOUS

## 2018-05-11 MED ORDER — BUPIVACAINE-EPINEPHRINE 0.25% -1:200000 IJ SOLN
INTRAMUSCULAR | Status: DC | PRN
  Administered 2018-05-11: 30 mL

## 2018-05-11 MED ORDER — LIDOCAINE 2% (20 MG/ML) 5 ML SYRINGE
INTRAMUSCULAR | Status: AC
  Filled 2018-05-11: qty 5

## 2018-05-11 MED ORDER — FENTANYL CITRATE (PF) 250 MCG/5ML IJ SOLN
INTRAMUSCULAR | Status: AC
  Filled 2018-05-11: qty 5

## 2018-05-11 SURGICAL SUPPLY — 52 items
ATTRACTOMAT 16X20 MAGNETIC DRP (DRAPES) ×2 IMPLANT
BENZOIN TINCTURE PRP APPL 2/3 (GAUZE/BANDAGES/DRESSINGS) IMPLANT
BLADE CLIPPER SURG (BLADE) ×2 IMPLANT
BLADE SURG 15 STRL LF DISP TIS (BLADE) ×1 IMPLANT
BLADE SURG 15 STRL SS (BLADE) ×1
CANISTER SUCT 3000ML PPV (MISCELLANEOUS) ×2 IMPLANT
CHLORAPREP W/TINT 10.5 ML (MISCELLANEOUS) IMPLANT
CLIP VESOCCLUDE MED 6/CT (CLIP) ×2 IMPLANT
CLIP VESOCCLUDE SM WIDE 6/CT (CLIP) ×6 IMPLANT
CONT SPEC 4OZ CLIKSEAL STRL BL (MISCELLANEOUS) ×4 IMPLANT
COVER SURGICAL LIGHT HANDLE (MISCELLANEOUS) ×2 IMPLANT
DERMABOND ADVANCED (GAUZE/BANDAGES/DRESSINGS) ×1
DERMABOND ADVANCED .7 DNX12 (GAUZE/BANDAGES/DRESSINGS) ×1 IMPLANT
DRAPE LAPAROTOMY 100X72 PEDS (DRAPES) ×2 IMPLANT
DRAPE SLUSH/WARMER DISC (DRAPES) IMPLANT
DRAPE UTILITY XL STRL (DRAPES) ×2 IMPLANT
ELECT CAUTERY BLADE 6.4 (BLADE) IMPLANT
ELECT COATED BLADE 2.86 ST (ELECTRODE) ×2 IMPLANT
ELECT REM PT RETURN 9FT ADLT (ELECTROSURGICAL) ×2
ELECTRODE REM PT RTRN 9FT ADLT (ELECTROSURGICAL) ×1 IMPLANT
GAUZE SPONGE 2X2 8PLY STRL LF (GAUZE/BANDAGES/DRESSINGS) IMPLANT
GAUZE SPONGE 4X4 16PLY XRAY LF (GAUZE/BANDAGES/DRESSINGS) ×2 IMPLANT
GLOVE BIO SURGEON STRL SZ7.5 (GLOVE) ×2 IMPLANT
GLOVE BIOGEL PI IND STRL 8 (GLOVE) ×1 IMPLANT
GLOVE BIOGEL PI INDICATOR 8 (GLOVE) ×1
GLOVE SURG SIGNA 7.5 PF LTX (GLOVE) ×2 IMPLANT
GOWN STRL REUS W/ TWL LRG LVL3 (GOWN DISPOSABLE) ×1 IMPLANT
GOWN STRL REUS W/ TWL XL LVL3 (GOWN DISPOSABLE) ×2 IMPLANT
GOWN STRL REUS W/TWL LRG LVL3 (GOWN DISPOSABLE) ×1
GOWN STRL REUS W/TWL XL LVL3 (GOWN DISPOSABLE) ×2
HEMOSTAT SURGICEL 2X4 FIBR (HEMOSTASIS) ×2 IMPLANT
KIT BASIN OR (CUSTOM PROCEDURE TRAY) ×2 IMPLANT
KIT TURNOVER KIT B (KITS) ×2 IMPLANT
NEEDLE HYPO 25GX1X1/2 BEV (NEEDLE) ×2 IMPLANT
NS IRRIG 1000ML POUR BTL (IV SOLUTION) ×2 IMPLANT
PACK GENERAL/GYN (CUSTOM PROCEDURE TRAY) ×2 IMPLANT
PACK SURGICAL SETUP 50X90 (CUSTOM PROCEDURE TRAY) IMPLANT
PAD ARMBOARD 7.5X6 YLW CONV (MISCELLANEOUS) ×4 IMPLANT
PENCIL BUTTON HOLSTER BLD 10FT (ELECTRODE) IMPLANT
SPONGE GAUZE 2X2 STER 10/PKG (GAUZE/BANDAGES/DRESSINGS)
SPONGE INTESTINAL PEANUT (DISPOSABLE) ×2 IMPLANT
STRIP CLOSURE SKIN 1/2X4 (GAUZE/BANDAGES/DRESSINGS) IMPLANT
SUT MNCRL AB 4-0 PS2 18 (SUTURE) ×2 IMPLANT
SUT SILK 2 0 (SUTURE)
SUT SILK 2-0 18XBRD TIE 12 (SUTURE) IMPLANT
SUT SILK 3 0 (SUTURE)
SUT SILK 3-0 18XBRD TIE 12 (SUTURE) IMPLANT
SUT VIC AB 3-0 SH 18 (SUTURE) ×2 IMPLANT
SYR CONTROL 10ML LL (SYRINGE) ×2 IMPLANT
TOWEL OR 17X24 6PK STRL BLUE (TOWEL DISPOSABLE) ×2 IMPLANT
TOWEL OR 17X26 10 PK STRL BLUE (TOWEL DISPOSABLE) IMPLANT
TUBE CONNECTING 12X1/4 (SUCTIONS) IMPLANT

## 2018-05-11 NOTE — Anesthesia Postprocedure Evaluation (Signed)
Anesthesia Post Note  Patient: Henry Smith  Procedure(s) Performed: PARATHYROIDECTOMY, LEFT (Left Neck)     Patient location during evaluation: PACU Anesthesia Type: General Level of consciousness: awake and alert Pain management: pain level controlled Vital Signs Assessment: post-procedure vital signs reviewed and stable Respiratory status: spontaneous breathing, nonlabored ventilation, respiratory function stable and patient connected to nasal cannula oxygen Cardiovascular status: blood pressure returned to baseline and stable Postop Assessment: no apparent nausea or vomiting Anesthetic complications: no    Last Vitals:  Vitals:   05/11/18 0930 05/11/18 0945  BP: 138/84 (!) 151/84  Pulse: (!) 113 (!) 114  Resp: (!) 25 19  Temp:    SpO2: 96% 96%    Last Pain:  Vitals:   05/10/18 2346  TempSrc: Oral  PainSc:                  [name] A.

## 2018-05-11 NOTE — Progress Notes (Signed)
OT Cancellation Note  Patient Details Name: Henry Smith MRN: [id number] DOB: 12/28/1940   Cancelled Treatment:    Reason Eval/Treat Not Completed: Patient at procedure or test/ unavailable. Pt off unit for procedure. Will check back as able.   [name], MS OTR/L  Pager: [phone]   [name] 05/11/2018, 7:42 AM

## 2018-05-11 NOTE — Transfer of Care (Signed)
Immediate Anesthesia Transfer of Care Note  Patient: Henry Smith  Procedure(s) Performed: PARATHYROIDECTOMY, LEFT (Left Neck)  Patient Location: PACU  Anesthesia Type:General  Level of Consciousness: awake, alert  and patient cooperative  Airway & Oxygen Therapy: Patient Spontanous Breathing and Patient connected to nasal cannula oxygen  Post-op Assessment: Report given to RN and Post -op Vital signs reviewed and stable  Post vital signs: Reviewed and stable  Last Vitals:  Vitals Value Taken Time  BP 150/77 05/11/2018  9:14 AM  Temp    Pulse 113 05/11/2018  9:16 AM  Resp 19 05/11/2018  9:16 AM  SpO2 97 % 05/11/2018  9:16 AM  Vitals shown include unvalidated device data.  Last Pain:  Vitals:   05/10/18 2346  TempSrc: Oral  PainSc:          Complications: No apparent anesthesia complications

## 2018-05-11 NOTE — Anesthesia Procedure Notes (Signed)
Procedure Name: Intubation Date/Time: 05/11/2018 7:33 AM Performed by: Babs Bertin, CRNA Pre-anesthesia Checklist: Patient identified, Emergency Drugs available, Suction available and Patient being monitored Patient Re-evaluated:Patient Re-evaluated prior to induction Oxygen Delivery Method: Circle System Utilized Preoxygenation: Pre-oxygenation with 100% oxygen Induction Type: IV induction Ventilation: Mask ventilation without difficulty Laryngoscope Size: Mac and 3 Grade View: Grade I Tube type: Oral Tube size: 7.5 mm Number of attempts: 1 Airway Equipment and Method: Stylet and Oral airway Placement Confirmation: ETT inserted through vocal cords under direct vision,  positive ETCO2 and breath sounds checked- equal and bilateral Secured at: 22 cm Tube secured with: Tape Dental Injury: Teeth and Oropharynx as per pre-operative assessment

## 2018-05-11 NOTE — Progress Notes (Signed)
Day of Surgery   Subjective/Chief Complaint: Pt doing well this AM    Objective: Vital signs in last 24 hours: Temp:  [97.9 F (36.6 C)-98.4 F (36.9 C)] 98.2 F (36.8 C) (05/28 2346) Pulse Rate:  [85-107] 85 (05/29 0506) Resp:  [18] 18 (05/29 0506) BP: (111-149)/(67-95) 120/91 (05/29 0506) SpO2:  [90 %-99 %] 96 % (05/29 0506) Last BM Date: 05/07/18  Intake/Output from previous day: 05/28 0701 - 05/29 0700 In: 120 [P.O.:120] Out: 1250 [Urine:1250] Intake/Output this shift: No intake/output data recorded.  Constitutional: No acute distress, conversant, appears states age. Eyes: Anicteric sclerae, moist conjunctiva, no lid lag Lungs: Clear to auscultation bilaterally, normal respiratory effort CV: regular rate and rhythm, no murmurs, no peripheral edema, pedal pulses 2+ GI: Soft, no masses or hepatosplenomegaly, non-tender to palpation Skin: No rashes, palpation reveals normal turgor Psychiatric: appropriate judgment and insight, oriented to person, place, and time    Lab Results:  No results for input(s): WBC, HGB, HCT, PLT in the last 72 hours. BMET Recent Labs    05/09/18 0348 05/10/18 0557  NA 143 142  K 3.6 3.9  CL 112* 110  CO2 26 28  GLUCOSE 132* 100*  BUN 16 13  CREATININE 0.82 0.82  CALCIUM 11.2* 11.5*  Anti-infectives: Anti-infectives (From admission, onward)   Start     Dose/Rate Route Frequency Ordered Stop   04/24/18 1800  cefTRIAXone (ROCEPHIN) 2 g in sodium chloride 0.9 % 100 mL IVPB  Status:  Discontinued     2 g 200 mL/hr over 30 Minutes Intravenous Every 24 hours 04/24/18 1323 05/03/18 0628   04/24/18 0400  aztreonam (AZACTAM) 1 g in sodium chloride 0.9 % 100 mL IVPB  Status:  Discontinued     1 g 200 mL/hr over 30 Minutes Intravenous Every 8 hours 04/23/18 1826 04/24/18 1323   04/23/18 1715  levofloxacin (LEVAQUIN) IVPB 750 mg     750 mg 100 mL/hr over 90 Minutes Intravenous  Once 04/23/18 1703 04/23/18 1909   04/23/18 1715  aztreonam  (AZACTAM) 2 g in sodium chloride 0.9 % 100 mL IVPB     2 g 200 mL/hr over 30 Minutes Intravenous  Once 04/23/18 1703 04/23/18 1953      Assessment/Plan: Sepsis with Proteus bacteremia/UTI Hypernatremia Acute metabolic encephalopathy Dementia associate with neurosyphilis, Schizophrenia Hx of prostate cancer Dysphagia Hypertension Thrombocytopenia    Hyperparathyroid with elevated calcium - medically treated LEFT superior parathyroid adenomaperSestamibi   FEN: IV fluids PRN/Dysphagia II ID: None DVT: None Follow up: TBD  Plan:  OR today Left parathyroidectomy.     LOS: 18 days    Rosario Jacks., Anne Hahn 05/11/2018

## 2018-05-11 NOTE — Progress Notes (Signed)
CSW following for discharge plan. Patient not medically stable for return to SNF at this time.  CSW to follow.  Laveda Abbe, [place] Clinical Social Worker [phone]

## 2018-05-11 NOTE — Op Note (Signed)
05/11/2018  9:05 AM  PATIENT:  Henry Smith  77 y.o. male  PRE-OPERATIVE DIAGNOSIS:  Left superior parathyroid adenoma  POST-OPERATIVE DIAGNOSIS:  Left superior parathyroid adenoma  PROCEDURE:  Procedure(s): PARATHYROIDECTOMY, LEFT (Left)  SURGEON:  Surgeon(s) and Role:    Ralene Ok, MD - Primary    * Coralie Keens, MD - Assisting  ANESTHESIA:   local and general  EBL:  minimal   BLOOD ADMINISTERED:none  DRAINS: none   LOCAL MEDICATIONS USED:  BUPIVICAINE   SPECIMEN:  Source of Specimen:  L superior parathyroid adenoma; confirmed on frozen section from Pathology  DISPOSITION OF SPECIMEN:  PATHOLOGY  COUNTS:  YES  TOURNIQUET:  * No tourniquets in log *  DICTATION: .Dragon Dictation Details of the procedure:  The patient was taken back to the operating room. The patient was placed in supine position with bilateral SCDs in place.  The patient was prepped and draped in the usual sterile fashion. After appropriate anitbiotics were confirmed, a time-out was confirmed and all facts were verified. A left-sided 3 cm incision was made approximately 2 fingerbreadths above the sternal notch. Bovie cautery was used to maintain hemostasis dissection was carried down through the platysma. The platysma was elevated and flaps were created superiorly and inferiorly to the thyroid cartilage as well as the sternal notch, repsectively. The strap muscles were identified in the midline and separated. left-sided strap muscles were elevated off the anterior surface of the thyroid. This dissection was carried laterally. We proceeded to dissect away the left thyroid lobe from the surrounding musculature from the thyroid.   A palpable mass was found to be posterior to the Left superior lobe of the thyroid and posterior to the esophagus.  The thyroid was reflected medially.  An area of fat with possible parathyroid was dissected away and sent to pathology.  A larger mass was seen intimately  associated with the thyroid lobe.  This mass was dissected away from the thyroid gland fairly easily.  Clips were placed on the supplying blood vessels.  The mass was sent to Pathology for confirmation of the adenoma.   Frozen section did confirm that the 2nd mass was a pathology adenoma.  The area was irrigated out. The dissection bed was hemostatic. We placed fibrillar hemostatic agent into the wound. Strap muscles were then reapproximated in the midline with interrupted 3-0 Vicryl stitches. The platysma was reapproximated using 3-0 Vicryl stitches in interrupted fashion. Skin was then reapproximated using a running subcuticular 4-0 Monocryl. The skin was then dressed with Dermabond.   The patient was taken to the recovery room in stable condition.   PLAN OF CARE: Admit to inpatient   PATIENT DISPOSITION:  PACU - hemodynamically stable.   Delay start of Pharmacological VTE agent (>24hrs) due to surgical blood loss or risk of bleeding: yes

## 2018-05-11 NOTE — Progress Notes (Signed)
PROGRESS NOTE  Henry Smith NOB:[id number] DOB: 09-22-1941 DOA: 04/23/2018 PCP: Caprice Renshaw, MD   LOS: 18 days   Brief Narrative / Interim history: Henry Smith is a 77 y.o. male with a history of schizophrenia, bipolar disorder, dementia associate with neurosyphilis, prostate cancer, hypertension.  Patient presented secondary to worsening mental status, including becoming nonverbal with facial droop.  Initial concern for stroke, however, MRI was negative.  He was found to have a UTI with associated bacteremia and is currently on antibiotic therapy. Hospital stay was complicated by hypernatremia and encephalopathy in the setting of hypercalcemia.  Patient is followed by endocrinology in [place] (Dr. Gabriel Carina) and has been diagnosed with primary hyperparathyroidism.  Assessment & Plan: Principal Problem:   Altered mental status, unspecified Active Problems:   Schizoaffective disorder, bipolar type (HCC)   Sepsis secondary to UTI [facility])   UTI (urinary tract infection)   Acute metabolic encephalopathy   Proteus mirabilis infection   Bacteremia   Sepsis due to Gram negative bacteria (HCC)   Acute metabolic encephalopathy -In setting of acute infection in setting of underlying dementia secondary to neurosyphilis. -MRI was negative for acute finding that could explain his mental status change. -EEG without evidence of seizure activity.  -He was improving initially, but he had a setback where he became encephalopathic again due to his hyponatremia and hypercalcemia, after correction of his hypercalcemia and hyponatremia, appears he is improving more awake and appropriate. -Does have significant improvement of mental status after stopping his psych medication including amantadine, Depakote and gabapentin, continue to hold -remains stable   Hyperparathyroidism/hypercalcemia -Being followed by endocrinology Dr. 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 Rosendo Gros, closely monitor Ca levels  -Dr. Roseanne Kaufman discussed with Dr. Gabriel Carina, at this point he needed parathyroidectomy, work-up significant for left upper thyroid lobe adenoma on ultrasound, sestamibi scan during hospital stay as well confirms left superior parathyroid adenoma  Sepsis/Bacteremia/UTI secondary to Proteus Mirabilis -Sensitive to ceftriaxone. Completed the course -Sepsis physiology resolved, he is afebrile, will repeat CBC tomorrow morning  Hypernatremia -Worsened with change in mental status.  Most likely nephrogenic DI in the setting of hypercalcemia, hypercalcemia has improved his hyponatremia has improved as well -Sodium has remained stable at 142 -Discontinue IV fluids today, encourage p.o. intake  Schizoaffective disorder Psychiatry consulted for evaluation. -given encephalopathy and lethargy, stopped his meds  Dysphagia -Possibly related to underlying dementia and schizophrenia vs metabolic encephalopathy from infection. -Continue IV fluids -followed by SLP -Continue to encourage oral intake  Acute kidney injury -Resolved.  Essential hypertension -Labile, overall acceptable, continue with losartan -Blood pressure stable, continue losartan and hydralazine  Thrombocytopenia -Chronic. Stable, no bleeding  Hypokalemia -Resolved with supplementation   DVT prophylaxis: Lovenox Code Status: Full code Family Communication: 2 brothers and a sister were present at bedside Disposition Plan: SNF when stable  Consultants:   Neurology   General surgery   Procedures:   EEG IMPRESSION:  This is an abnormal EEG demonstrating amild to moderatediffuse slowing of electrocerebral activity. This can be  seen in a wide variety of encephalopathic state including those of a toxic, metabolic, or degenerative nature. There were no focal, hemispheric, or lateralizing features. No epileptiform activity was recorded.  Antimicrobials:  Levaquin (5/11)  Aztreonam (5/11>>5/12)  Ceftriaxone (5/12>>5/22  Subjective: -seen post op, not complaining of pain, no dyspnea, feels generally well   Objective: Vitals:   05/11/18 0930 05/11/18 0945 05/11/18 1000 05/11/18 1133  BP: 138/84 (!) 151/84 (!) 153/92 (!) 154/91  Pulse: (!) 113 (!) 114 (!) 112 (!) 105  Resp: (!) 25 19 (!) 22 19  Temp:   97.7 F (36.5 C) 98.2 F (36.8 C)  TempSrc:    Oral  SpO2: 96% 96% 93% 96%  Weight:      Height:        Intake/Output Summary (Last 24 hours) at 05/11/2018 1200 Last data filed at 05/11/2018 3536 Gross per 24 hour  Intake 1570 ml  Output 1600 ml  Net -30 ml   Filed Weights   04/23/18 1429 04/23/18 2025 04/24/18 0548  Weight: 90.7 kg (200 lb) 98.2 kg (216 lb 7.9 oz) 98.6 kg (217 lb 6 oz)    Examination:  Constitutional: no distress Eyes: no scleral icterus ENMT: mmm Neck: supple, surgical scar present  Respiratory: CTA biL, no wheezing, no crackles Cardiovascular: RRR without MRG, no edema  Abdomen: soft, NT, ND, BS + Skin: no rashes seen  Neurologic: no focal findings    Data Reviewed: I have independently reviewed following labs and imaging studies   CBC: Recent Labs  Lab 05/05/18 0237 05/06/18 0411 05/07/18 0617 05/11/18 0630  WBC 10.5 9.8 7.6 5.4  HGB 14.2 13.5 12.8* 12.4*  HCT 46.2 42.6 40.4 39.1  MCV 89.4 87.1 86.3 86.1  PLT 172 154 143* 144*   Basic Metabolic Panel: Recent Labs  Lab 05/07/18 0251 05/07/18 0617 05/08/18 0414 05/09/18 0348 05/10/18 0557 05/11/18 0630  NA 144  --  141 143 142 142  K 5.6* 4.1 3.6 3.6 3.9 4.6  CL 116*  --  111 112* 110 110  CO2 23  --  25 26 28 26   GLUCOSE 109*  --  86 132* 100* 109*  BUN 17  --  12 16 13 17   CREATININE 0.88  --   0.74 0.82 0.82 0.76  CALCIUM 11.5*  --  11.0* 11.2* 11.5* 11.7*   GFR: Estimated Creatinine Clearance: 96.9 mL/min (by C-G formula based on SCr of 0.76 mg/dL). Liver Function Tests: Recent Labs  Lab 05/11/18 0630  AST 31  ALT 53  ALKPHOS 75  BILITOT 0.8  PROT 6.4*  ALBUMIN 2.5*   No results for input(s): LIPASE, AMYLASE in the last 168 hours. No results for input(s): AMMONIA in the last 168 hours. Coagulation Profile: Recent Labs  Lab 05/11/18 0630  INR 1.08   Cardiac Enzymes: No results for input(s): CKTOTAL, CKMB, CKMBINDEX, TROPONINI in the last 168 hours. BNP (last 3 results) No results for input(s): PROBNP in the last 8760 hours. HbA1C: No results for input(s): HGBA1C in the last 72 hours. CBG: Recent Labs  Lab 05/10/18 1049 05/10/18 1701 05/10/18 2134 05/11/18 0642 05/11/18 1137  GLUCAP 125* 116* 103* 111* 110*   Lipid Profile: No results for input(s): CHOL, HDL, LDLCALC, TRIG, CHOLHDL, LDLDIRECT in the last 72 hours. Thyroid Function Tests: No results for input(s): TSH, T4TOTAL, FREET4, T3FREE, THYROIDAB in the last 72 hours. Anemia Panel: No results for input(s): VITAMINB12, FOLATE, FERRITIN, TIBC, IRON, RETICCTPCT in the last 72 hours. Urine analysis:    Component Value Date/Time   COLORURINE YELLOW 04/23/2018 1540   APPEARANCEUR TURBID (A) 04/23/2018 1540   LABSPEC 1.021 04/23/2018 1540   PHURINE 5.0 04/23/2018 1540   GLUCOSEU 50 (A) 04/23/2018 1540   HGBUR LARGE (  A) 04/23/2018 1540   BILIRUBINUR NEGATIVE 04/23/2018 1540   KETONESUR NEGATIVE 04/23/2018 1540   PROTEINUR 100 (A) 04/23/2018 1540   NITRITE NEGATIVE 04/23/2018 1540   LEUKOCYTESUR MODERATE (A) 04/23/2018 1540   Sepsis Labs: Invalid input(s): PROCALCITONIN, LACTICIDVEN  No results found for this or any previous visit (from the past 240 hour(s)).    Radiology Studies: No results found.   Scheduled Meds: . calcium-vitamin D  2 tablet Oral TID  . feeding supplement (ENSURE  ENLIVE)  237 mL Oral TID BM  . hydrALAZINE  10 mg Intravenous Q6H  . latanoprost  1 drop Both Eyes QHS  . losartan  25 mg Oral Daily  . magnesium oxide  400 mg Oral Daily   Continuous Infusions: . dextrose 75 mL/hr at 05/10/18 0502    Marzetta Board, MD, PhD [facility] Pager [phone]  If 7PM-7AM, please contact night-coverage [email or web address] Password [email or web address] 05/11/2018, 12:00 PM

## 2018-05-12 ENCOUNTER — Encounter (HOSPITAL_COMMUNITY): Payer: Self-pay | Admitting: General Surgery

## 2018-05-12 LAB — CBC
HCT: 37.3 % — ABNORMAL LOW (ref 39.0–52.0)
HEMOGLOBIN: 12 g/dL — AB (ref 13.0–17.0)
MCH: 28.2 pg (ref 26.0–34.0)
MCHC: 32.2 g/dL (ref 30.0–36.0)
MCV: 87.6 fL (ref 78.0–100.0)
PLATELETS: 136 10*3/uL — AB (ref 150–400)
RBC: 4.26 MIL/uL (ref 4.22–5.81)
RDW: 16.4 % — ABNORMAL HIGH (ref 11.5–15.5)
WBC: 10 10*3/uL (ref 4.0–10.5)

## 2018-05-12 LAB — GLUCOSE, CAPILLARY
GLUCOSE-CAPILLARY: 79 mg/dL (ref 65–99)
GLUCOSE-CAPILLARY: 87 mg/dL (ref 65–99)
GLUCOSE-CAPILLARY: 123 mg/dL — AB (ref 65–99)
GLUCOSE-CAPILLARY: 131 mg/dL — AB (ref 65–99)

## 2018-05-12 LAB — COMPREHENSIVE METABOLIC PANEL
ALT: 44 U/L (ref 17–63)
ANION GAP: 3 — AB (ref 5–15)
AST: 26 U/L (ref 15–41)
Albumin: 2.7 g/dL — ABNORMAL LOW (ref 3.5–5.0)
Alkaline Phosphatase: 68 U/L (ref 38–126)
BUN: 20 mg/dL (ref 6–20)
CALCIUM: 11.4 mg/dL — AB (ref 8.9–10.3)
CHLORIDE: 108 mmol/L (ref 101–111)
CO2: 30 mmol/L (ref 22–32)
Creatinine, Ser: 0.94 mg/dL (ref 0.61–1.24)
Glucose, Bld: 87 mg/dL (ref 65–99)
Potassium: 4.4 mmol/L (ref 3.5–5.1)
SODIUM: 141 mmol/L (ref 135–145)
Total Bilirubin: 0.6 mg/dL (ref 0.3–1.2)
Total Protein: 6.4 g/dL — ABNORMAL LOW (ref 6.5–8.1)

## 2018-05-12 NOTE — Progress Notes (Signed)
He needs to go home on Oscal & magnesium oxide.  He will need a PTH and calcium a day or two before he returns to see Dr. Rosendo Gros.  I will put follow up information in the AVS.  He can shower and wash the wound as he normally would without the surgery.  He has glue as a dressing and it will come off in time.  The SNF can set up the follow up appointment after he is discharged.

## 2018-05-12 NOTE — Progress Notes (Signed)
PROGRESS NOTE  Henry Smith CHE:[id number] DOB: [birth year] DOA: 04/23/2018 PCP: Caprice Renshaw, MD   LOS: 19 days   Brief Narrative / Interim history: Henry Smith is a 77 y.o. male with a history of schizophrenia, bipolar disorder, dementia associate with neurosyphilis, prostate cancer, hypertension.  Patient presented secondary to worsening mental status, including becoming nonverbal with facial droop.  Initial concern for stroke, however, MRI was negative.  He was found to have a UTI with associated bacteremia and is currently on antibiotic therapy. Hospital stay was complicated by hypernatremia and encephalopathy in the setting of hypercalcemia.  Patient is followed by endocrinology in [place] (Dr. Gabriel Carina) and has been diagnosed with primary hyperparathyroidism.  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 -MRI was negative for acute finding that could explain his mental status change. -EEG without evidence of seizure activity.  -He was improving initially, but he had a setback where he became encephalopathic again due to his hyponatremia and hypercalcemia, after correction of his hypercalcemia and hyponatremia, appears he is improving more awake and appropriate. -Does have significant improvement of mental status after stopping his psych medication including amantadine, Depakote and gabapentin, continue to hold -remains stable   Hyperparathyroidism/hypercalcemia -Being followed by endocrinology Dr. 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 Rosendo Gros, his calcium improved some but still 12.4 when corrected with albumin, will recheck tomorrow morning.  Placed on Os-Cal by surgical team  Sepsis/Bacteremia/UTI secondary to Proteus Mirabilis -Sensitive to ceftriaxone. Completed the course -Sepsis physiology resolved, he is afebrile  Hypernatremia -Worsened with change in mental status.  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 -given encephalopathy and lethargy, stopped his meds  Dysphagia -Possibly related to underlying dementia and schizophrenia vs metabolic encephalopathy from infection. -Continue IV fluids -followed by SLP -Continue to encourage oral intake  Acute kidney injury -Resolved.  Essential hypertension -Labile, overall acceptable, continue with losartan -Blood pressure stable, continue losartan and hydralazine  Thrombocytopenia -Chronic. Stable, no bleeding  Hypokalemia -Resolved with supplementation   DVT prophylaxis: Lovenox Code Status: Full code Family Communication: No family present at bedside Disposition Plan: SNF likely in 1 day pending morning calcium levels  Consultants:   Neurology   General surgery   Procedures:   EEG IMPRESSION:  This is an abnormal EEG demonstrating amild to moderatediffuse slowing of electrocerebral activity. This can be seen in a wide variety of encephalopathic state including those of a toxic, metabolic, or degenerative nature. There were no focal, hemispheric, or lateralizing features. No epileptiform activity was  recorded.  Antimicrobials:  Levaquin (5/11)  Aztreonam (5/11>>5/12)  Ceftriaxone (5/12>>5/22  Subjective: -No complaints this morning, denies any pain  Objective: Vitals:   05/11/18 2359 05/12/18 0510 05/12/18 0816 05/12/18 1114  BP: 105/72 101/85 90/62  107/65  Pulse: (!) 102 (!) 139 (!) 104 (!) 108  Resp: 18 18 20 20   Temp: 98.2 F (36.8 C) 99.3 F (37.4 C) 98.9 F (37.2 C) 98.6 F (37 C)  TempSrc: Oral Oral Oral Oral  SpO2: 95% 93% 95% 97%  Weight:      Height:        Intake/Output Summary (Last 24 hours) at 05/12/2018 1235 Last data filed at 05/12/2018 1200 Gross per 24 hour  Intake 1320 ml  Output 1250 ml  Net 70 ml   Filed Weights   04/23/18 1429 04/23/18 2025 04/24/18 0548  Weight: 90.7 kg (200 lb) 98.2 kg (216 lb 7.9 oz) 98.6 kg (217 lb 6 oz)    Examination:  Constitutional: NAD Eyes: no icterus ENMT: mmm Neck: surgical scar stable Respiratory: CTA biL Cardiovascular: RRR, no edema Abdomen: soft, NT, ND Skin: no rashes   Data Reviewed: I have independently reviewed following labs and imaging studies   CBC: Recent Labs  Lab 05/06/18 0411 05/07/18 0617 05/11/18 0630 05/12/18 0603  WBC 9.8 7.6 5.4 10.0  HGB 13.5 12.8* 12.4* 12.0*  HCT 42.6 40.4 39.1 37.3*  MCV 87.1 86.3 86.1 87.6  PLT 154 143* 139* 161*   Basic Metabolic Panel: Recent Labs  Lab 05/08/18 0414 05/09/18 0348 05/10/18 0557 05/11/18 0630 05/11/18 1912 05/12/18 0603  NA 141 143 142 142  --  141  K 3.6 3.6 3.9 4.6  --  4.4  CL 111 112* 110 110  --  108  CO2 25 26 28 26   --  30  GLUCOSE 86 132* 100* 109*  --  87  BUN 12 16 13 17   --  20  CREATININE 0.74 0.82 0.82 0.76  --  0.94  CALCIUM 11.0* 11.2* 11.5* 11.7* 11.2* 11.4*   GFR: Estimated Creatinine Clearance: 82.5 mL/min (by C-G formula based on SCr of 0.94 mg/dL). Liver Function Tests: Recent Labs  Lab 05/11/18 0630 05/12/18 0603  AST 31 26  ALT 53 44  ALKPHOS 75 68  BILITOT 0.8 0.6  PROT 6.4* 6.4*    ALBUMIN 2.5* 2.7*   No results for input(s): LIPASE, AMYLASE in the last 168 hours. No results for input(s): AMMONIA in the last 168 hours. Coagulation Profile: Recent Labs  Lab 05/11/18 0630  INR 1.08   Cardiac Enzymes: No results for input(s): CKTOTAL, CKMB, CKMBINDEX, TROPONINI in the last 168 hours. BNP (last 3 results) No results for input(s): PROBNP in the last 8760 hours. HbA1C: No results for input(s): HGBA1C in the last 72 hours. CBG: Recent Labs  Lab 05/11/18 1137 05/11/18 1625 05/11/18 2207 05/12/18 0617 05/12/18 1113  GLUCAP 110* 148* 109* 79 87   Lipid Profile: No results for input(s): CHOL, HDL, LDLCALC, TRIG, CHOLHDL, LDLDIRECT in the last 72 hours. Thyroid Function Tests: No results for input(s): TSH, T4TOTAL, FREET4, T3FREE, THYROIDAB in the last 72 hours. Anemia Panel: No results for input(s): VITAMINB12, FOLATE, FERRITIN, TIBC, IRON, RETICCTPCT in the last 72 hours. Urine analysis:    Component Value Date/Time   COLORURINE YELLOW 04/23/2018 1540   APPEARANCEUR TURBID (A) 04/23/2018 1540   LABSPEC 1.021 04/23/2018 1540   PHURINE 5.0 04/23/2018 1540   GLUCOSEU 50 (A) 04/23/2018 1540   HGBUR LARGE (A) 04/23/2018 1540   BILIRUBINUR NEGATIVE 04/23/2018 1540   KETONESUR NEGATIVE 04/23/2018 1540   PROTEINUR 100 (A) 04/23/2018 1540   NITRITE NEGATIVE 04/23/2018 1540   LEUKOCYTESUR MODERATE (A) 04/23/2018 1540   Sepsis Labs: Invalid input(s): PROCALCITONIN,  LACTICIDVEN  No results found for this or any previous visit (from the past 240 hour(s)).    Radiology Studies: No results found.   Scheduled Meds: . calcium-vitamin D  2 tablet Oral TID  . feeding supplement (ENSURE ENLIVE)  237 mL Oral TID BM  . hydrALAZINE  10 mg Intravenous Q6H  . latanoprost  1 drop Both Eyes QHS  . losartan  25 mg Oral Daily  . magnesium oxide  400 mg Oral Daily   Continuous Infusions:   Marzetta Board, MD, PhD [facility] Pager [phone]  If  7PM-7AM, please contact night-coverage [email or web address] Password [facility] 05/12/2018, 12:35 PM

## 2018-05-12 NOTE — Discharge Instructions (Signed)
[facility], [place] [phone]  THYROID/ PARATHYROID SURGERY: POST OP INSTRUCTIONS CAll for follow up appointment in 2 weeks after discharge from the hospital  He needs to be on Magnesium Oxide and Oscal on discharge. He needs to have PTH and calcium done 2-3 days before her returns to see Dr. Rosendo Gros.  Always review your discharge instruction sheet given to you by the facility where your surgery was performed.  IF YOU HAVE DISABILITY OR FAMILY LEAVE FORMS, YOU MUST BRING THEM TO THE OFFICE FOR PROCESSING.  PLEASE DO NOT GIVE THEM TO YOUR DOCTOR.  1. A prescription for pain medication may be given to you upon discharge.  Take your pain medication as prescribed, if needed.  If narcotic pain medicine is not needed, then you may take acetaminophen (Tylenol) or ibuprofen (Advil) as needed. 2. Take your usually prescribed medications unless otherwise directed. 3. If you need a refill on your pain medication, please contact your pharmacy. They will contact our office to request authorization.  Prescriptions will not be filled after 5pm or on week-ends. 4. You should follow a light diet the first 24 hours after arrival home, such as soup and crackers, etc.  Be sure to include lots of fluids daily.  Resume your normal diet the day after surgery. 5. Most patients will experience some swelling and bruising on the chest and neck area.  Ice packs will help.  Swelling and bruising can take several days to resolve.  6. It is common to experience some constipation if taking pain medication after surgery.  Increasing fluid intake and taking a stool softener will usually help or prevent this problem from occurring.  A mild laxative (Milk of Magnesia or Miralax) should be taken according to package directions if there are no bowel movements after 48 hours. 7. Unless discharge instructions indicate otherwise, you may remove your bandages 24-48 hours after surgery, and you may shower at that time.   You may have steri-strips (small skin tapes) in place directly over the incision.  These strips should be left on the skin for 7-10 days.  If your surgeon used skin glue on the incision, you may shower in 24 hours.  The glue will flake off over the next 2-3 weeks.  Any sutures or staples will be removed at the office during your follow-up visit. 8. ACTIVITIES:  You may resume regular (light) daily activities beginning the next day--such as daily self-care, walking, climbing stairs--gradually increasing activities as tolerated.  You may have sexual intercourse when it is comfortable.  Refrain from any heavy lifting or straining until approved by your doctor. a. You may drive when you no longer are taking prescription pain medication, you can comfortably wear a seatbelt, and you can safely maneuver your car and apply brakes b. RETURN TO WORK:  __________________________________________________________ 9. You should see your doctor in the office for a follow-up appointment approximately two weeks after your surgery.  Make sure that you call for this appointment within a day or two after you arrive home to insure a convenient appointment time. 10. OTHER INSTRUCTIONS: ____________________________________________________________________________ _________________________________________________________________________________________________________________ _________________________________________________________________________________________________________________   WHEN TO CALL YOUR DOCTOR: 1. Fever over 101.0 2. Inability to urinate 3. Nausea and/or vomiting 4. Extreme swelling or bruising 5. Continued bleeding from incision. 6. Increased pain, redness, or drainage from the incision. 7. Difficulty swallowing or breathing 8. Muscle cramping or spasms. 9. Numbness or tingling in hands or feet or around lips.  The clinic staff  is available to answer your questions during regular business hours.   Please dont hesitate to call and ask to speak to one of the nurses if you have concerns.  For further questions, please visit [email or web address]

## 2018-05-12 NOTE — Progress Notes (Addendum)
Nutrition Follow-up  DOCUMENTATION CODES:   Obesity unspecified  INTERVENTION:  Continue Ensure Enlive po TID, each supplement provides 350 kcal and 20 grams of protein  Continue Magic cup TID with meals, each supplement provides 290 kcal and 9 grams of protein  Continue MVI liquid  Recommend obtain new weight (patient has not been weighed since 04/24/2018)  NUTRITION DIAGNOSIS:   Swallowing difficulty related to dysphagia as evidenced by other (comment)(Needs NDD2 Diet). -new dx  GOAL:   Patient will meet greater than or equal to 90% of their needs -progressing  MONITOR:   Supplement acceptance, PO intake, Weight trends  ASSESSMENT:   77 y.o. male, with history of schizophrenia, dementia associated with neurosyphilis, prostate cancer was brought to hospital with altered mental status. Patient was initially nonverbal. Patient has been nonverbal drooping, right-sided facial droop not able to walk as per nursing home staff thought that has "schizophrenia might be acting up". Patient was sent to ED for further evaluation. Patient was found to have UTI, lactic acidosis, creatinine 1.5, admitted for further evaluation.  Overnight was noted to have motor aphasia with worsening right-sided hemiparesis, patient was transferred to [facility] for MRI brain to rule out CVA.  Patient sleeping during RD visit. 1 day s/p L parathyroidectomy, mental status improved PO averaging 72% over last 8 meals, consuming ensure TID He had scrambled eggs with cheese, sausage, peaches, and grits for breakfast - ate 75%   Dispo: D/C to SNF pending calcium levels  Labs reviewed:  Ca 11.4  Medications reviewed and include:  Mag-ox, Cal-Vit D  Diet Order:   Diet Order           DIET DYS 2 Room service appropriate? Yes; Fluid consistency: Thin  Diet effective now          EDUCATION NEEDS:   No education needs have been identified at this time  Skin:  Skin Assessment: Reviewed RN  Assessment  Last BM:  05/11/2018  Height:   Ht Readings from Last 1 Encounters:  04/23/18 5\' 11"  (1.803 m)    Weight:   Wt Readings from Last 1 Encounters:  04/24/18 217 lb 6 oz (98.6 kg)    Ideal Body Weight:  78.18 kg  BMI:  Body mass index is 30.32 kg/m.  Estimated Nutritional Needs:   Kcal:  1775-1970 (18-20 kcal/kg)  Protein:  110-120 grams  Fluid:  >/= 2 L/day   Satira Anis. Dmonte Maher, MS, RD LDN Inpatient Clinical Dietitian Pager [phone]

## 2018-05-12 NOTE — Progress Notes (Signed)
1 Day Post-Op   Subjective/Chief Complaint: Pt doing well.  No acute events overnight. Tol PO   Objective: Vital signs in last 24 hours: Temp:  [97.4 F (36.3 C)-99.3 F (37.4 C)] 99.3 F (37.4 C) (05/30 0510) Pulse Rate:  [93-139] 139 (05/30 0510) Resp:  [16-25] 18 (05/30 0510) BP: (101-154)/(67-92) 101/85 (05/30 0510) SpO2:  [93 %-96 %] 93 % (05/30 0510) Last BM Date: 05/11/18  Intake/Output from previous day: 05/29 0701 - 05/30 0700 In: 2050 [P.O.:600; I.V.:1200; IV Piggyback:250] Out: 1600 [Urine:1550; Blood:50] Intake/Output this shift: No intake/output data recorded.  General appearance: alert and cooperative Neck: inc c/d/i  Lab Results:  Recent Labs    05/11/18 0630 05/12/18 0603  WBC 5.4 10.0  HGB 12.4* 12.0*  HCT 39.1 37.3*  PLT 139* 136*   BMET Recent Labs    05/11/18 0630 05/11/18 1912 05/12/18 0603  NA 142  --  141  K 4.6  --  4.4  CL 110  --  108  CO2 26  --  30  GLUCOSE 109*  --  87  BUN 17  --  20  CREATININE 0.76  --  0.94  CALCIUM 11.7* 11.2* 11.4*   PT/INR Recent Labs    05/11/18 0630  LABPROT 14.0  INR 1.08   ABG No results for input(s): PHART, HCO3 in the last 72 hours.  Invalid input(s): PCO2, PO2  Studies/Results: No results found.  Anti-infectives: Anti-infectives (From admission, onward)   Start     Dose/Rate Route Frequency Ordered Stop   05/11/18 0745  vancomycin (VANCOCIN) 1,500 mg in sodium chloride 0.9 % 500 mL IVPB     1,500 mg 250 mL/hr over 120 Minutes Intravenous To Surgery 05/11/18 0746 05/11/18 0848   04/24/18 1800  cefTRIAXone (ROCEPHIN) 2 g in sodium chloride 0.9 % 100 mL IVPB  Status:  Discontinued     2 g 200 mL/hr over 30 Minutes Intravenous Every 24 hours 04/24/18 1323 05/03/18 0628   04/24/18 0400  aztreonam (AZACTAM) 1 g in sodium chloride 0.9 % 100 mL IVPB  Status:  Discontinued     1 g 200 mL/hr over 30 Minutes Intravenous Every 8 hours 04/23/18 1826 04/24/18 1323   04/23/18 1715   levofloxacin (LEVAQUIN) IVPB 750 mg     750 mg 100 mL/hr over 90 Minutes Intravenous  Once 04/23/18 1703 04/23/18 1909   04/23/18 1715  aztreonam (AZACTAM) 2 g in sodium chloride 0.9 % 100 mL IVPB     2 g 200 mL/hr over 30 Minutes Intravenous  Once 04/23/18 1703 04/23/18 1953      Assessment/Plan: s/p Procedure(s): PARATHYROIDECTOMY, LEFT (Left) Advance diet as tol Mobilize as tol Pt f/u entered.   Will follow at a distance.  Call with any questions.  LOS: 19 days    Henry Smith., Anne Hahn 05/12/2018

## 2018-05-12 NOTE — Progress Notes (Signed)
Physical Therapy Treatment Patient Details Name: Henry Smith MRN: [id number] DOB: 05-10-1941 Today's Date: 05/12/2018    History of Present Illness Pt is a 77 y.o. male admitted 04/23/18 with AMS, including non-verbal, not walking and R-side facial droop. Worked up for encephalopathy secondary to sepsis due to UTI, AKI. Head CT and MRI show no acute intracranial finding; mild small vessel change normal for age. PMH includes dementia, schizophrenia, prostate CA. s/p parathyroidectomy, LEFT 5/29.     PT Comments    Pt progressing with PT, more interactive today, improved transfers/decr assist needed this session; continue to recommend SNF at d/c  Follow Up Recommendations  SNF;Supervision/Assistance - 24 hour     Equipment Recommendations  None recommended by PT    Recommendations for Other Services       Precautions / Restrictions Precautions Precautions: Fall Restrictions Weight Bearing Restrictions: No    Mobility  Bed Mobility Overal bed mobility: Needs Assistance Bed Mobility: Supine to Sit Rolling: Min assist Sidelying to sit: Mod assist;+2 for physical assistance       General bed mobility comments: increased time and effort, step by step cues needed for participation, sequencing; difficulty processing   Transfers Overall transfer level: Needs assistance Equipment used: Rolling walker (2 wheeled);2 person hand held assist Transfers: Sit to/from [employer] Transfers Sit to Stand: Min assist;+2 safety/equipment;+2 physical assistance Stand pivot transfers: +2 safety/equipment;Min assist       General transfer comment: sit to stand from bed and chair with+2 min assist for transition and safety; requires step by step cues for foot placement, UE placement and safety  Ambulation/Gait                 Stairs             Wheelchair Mobility    Modified Rankin (Stroke Patients Only)       Balance Overall balance assessment: Needs  assistance Sitting-balance support: Bilateral upper extremity supported;Feet supported;Feet unsupported Sitting balance-Leahy Scale: Fair Sitting balance - Comments: BUE support and min guard for sitting EOB   Standing balance support: Bilateral upper extremity supported Standing balance-Leahy Scale: Poor Standing balance comment: dependent on BUE support and assist from therapist                            Cognition Arousal/Alertness: Awake/alert Behavior During Therapy: Flat affect Overall Cognitive Status: Impaired/Different from baseline Area of Impairment: Following commands;Awareness;Problem solving;Attention                   Current Attention Level: Sustained   Following Commands: Follows one step commands with increased time Safety/Judgement: Decreased awareness of safety;Decreased awareness of deficits Awareness: Intellectual Problem Solving: Slow processing;Decreased initiation;Requires verbal cues General Comments: Pt participatory, asked questions multiple times in a row, but very pleasant and cooperative for the entirety of the session      Exercises      General Comments General comments (skin integrity, edema, etc.): pt soiled/bed soaked in urine, pt cleaned and RN made aware      Pertinent Vitals/Pain Pain Assessment: No/denies pain Faces Pain Scale: No hurt Pain Intervention(s): Monitored during session    Home Living                      Prior Function            PT Goals (current goals can now be found in the care plan section) Acute Rehab  PT Goals Patient Stated Goal: not stated PT Goal Formulation: Patient unable to participate in goal setting Time For Goal Achievement: 05/15/18 Potential to Achieve Goals: Fair Progress towards PT goals: Progressing toward goals    Frequency    Min 2X/week      PT Plan Current plan remains appropriate    Co-evaluation PT/OT/SLP Co-Evaluation/Treatment: Yes Reason for  Co-Treatment: For patient/therapist safety;Necessary to address cognition/behavior during functional activity;To address functional/ADL transfers PT goals addressed during session: Mobility/safety with mobility OT goals addressed during session: ADL's and self-care;Strengthening/ROM;Proper use of Adaptive equipment and DME      AM-PAC PT "6 Clicks" Daily Activity  Outcome Measure  Difficulty turning over in bed (including adjusting bedclothes, sheets and blankets)?: A Lot Difficulty moving from lying on back to sitting on the side of the bed? : Unable Difficulty sitting down on and standing up from a chair with arms (e.g., wheelchair, bedside commode, etc,.)?: Unable Help needed moving to and from a bed to chair (including a wheelchair)?: A Lot Help needed walking in hospital room?: A Lot Help needed climbing 3-5 steps with a railing? : Total 6 Click Score: 9    End of Session Equipment Utilized During Treatment: Gait belt Activity Tolerance: Patient tolerated treatment well Patient left: in chair;with call bell/phone within reach;with chair alarm set;with nursing/sitter in room Nurse Communication: Mobility status PT Visit Diagnosis: Other abnormalities of gait and mobility (R26.89);Other symptoms and signs involving the nervous system (R29.898)     Time: 0349-1791 PT Time Calculation (min) (ACUTE ONLY): 23 min  Charges:  $Therapeutic Activity: 8-22 mins                    G CodesKenyon Ana, PT Pager: [phone] 05/12/2018    Kenyon Ana 05/12/2018, 1:13 PM

## 2018-05-12 NOTE — Progress Notes (Signed)
Occupational Therapy Treatment Patient Details Name: Henry Smith MRN: [id number] DOB: 12-24-1940 Today's Date: 05/12/2018    History of present illness Pt is a 77 y.o. male admitted 04/23/18 with AMS, including non-verbal, not walking and R-side facial droop. Worked up for encephalopathy secondary to sepsis due to UTI, AKI. Head CT and MRI show no acute intracranial finding; mild small vessel change normal for age. PMH includes dementia, schizophrenia, prostate CA. s/p parathyroidectomy, LEFT 5/29.    OT comments  Pt progressing towards OT goals. Pt more talkative today, and able to perform [facility] face grooming with RUE, and stand pivot transfer with min A +2 for safety HHA. Pt continues to benefit from skilled OT in the acute setting and afterwards will require SNF level therapy.    Follow Up Recommendations  SNF    Equipment Recommendations  None recommended by OT    Recommendations for Other Services      Precautions / Restrictions Precautions Precautions: Fall Restrictions Weight Bearing Restrictions: No       Mobility Bed Mobility Overal bed mobility: Needs Assistance Bed Mobility: Supine to Sit Rolling: Min assist(for cues only - no physical assist) Sidelying to sit: Mod assist;+2 for physical assistance(cues for sequencing)       General bed mobility comments: increased time and effort, Pt kept trying to lay on his side - so he required increased assist as he seemed to not understand what we were asking him to do.   Transfers Overall transfer level: Needs assistance Equipment used: Rolling walker (2 wheeled);2 person hand held assist Transfers: Sit to/from [employer] Sit to Stand: Min assist;+2 safety/equipment Stand pivot transfers: Min assist;+2 safety/equipment       General transfer comment: pt stood intially from EOB with +2 for safety, able to pivot to chair with +2 for safety HHA    Balance Overall balance assessment: Needs  assistance Sitting-balance support: Bilateral upper extremity supported;Feet supported Sitting balance-Leahy Scale: Fair Sitting balance - Comments: BUE support and min guard for sitting EOB   Standing balance support: Bilateral upper extremity supported Standing balance-Leahy Scale: Poor Standing balance comment: dependent on BUE support                           ADL either performed or assessed with clinical judgement   ADL Overall ADL's : Needs assistance/impaired Eating/Feeding: Maximal assistance Eating/Feeding Details (indicate cue type and reason): LUE used for feeding, requires HOH assist - Rn completing feeding after OT initialized Grooming: Wash/dry face;Maximal assistance;Sitting Grooming Details (indicate cue type and reason): EOB - no support needed for sitting balance - min guard HOH support of RUE             Lower Body Dressing: Total assistance Lower Body Dressing Details (indicate cue type and reason): Pt does not initiate action to pull socks up Toilet Transfer: Minimal assistance;Stand-pivot;+2 for physical assistance Toilet Transfer Details (indicate cue type and reason): simulated EOB to chair. rocking and counting to assist Pt with transfer [place] and Hygiene: Sit to/from stand;Maximal assistance;+2 for safety/equipment Toileting - Clothing Manipulation Details (indicate cue type and reason): able to maintain standing with RW and therapist support while rear peri care performed in standing             Vision       Perception     Praxis      Cognition Arousal/Alertness: Awake/alert Behavior During Therapy: Flat affect Overall Cognitive Status: Impaired/Different  from baseline Area of Impairment: Following commands;Awareness;Problem solving;Attention                   Current Attention Level: Sustained   Following Commands: Follows one step commands with increased time Safety/Judgement: Decreased  awareness of safety;Decreased awareness of deficits Awareness: Intellectual Problem Solving: Slow processing;Decreased initiation;Requires verbal cues General Comments: Pt participatory, asked questions multiple times in a row, but very pleasant and cooperative for the entirety of the session        Exercises     Shoulder Instructions       General Comments condom cath leaking upon entry, Pt cleaned and RN/NT notified    Pertinent Vitals/ Pain       Pain Assessment: No/denies pain Faces Pain Scale: No hurt Pain Intervention(s): Monitored during session  Home Living                                          Prior Functioning/Environment              Frequency  Min 2X/week        Progress Toward Goals  OT Goals(current goals can now be found in the care plan section)  Progress towards OT goals: Progressing toward goals  Acute Rehab OT Goals Patient Stated Goal: Unstated  Plan Discharge plan remains appropriate    Co-evaluation    PT/OT/SLP Co-Evaluation/Treatment: Yes Reason for Co-Treatment: Necessary to address cognition/behavior during functional activity;For patient/therapist safety;To address functional/ADL transfers PT goals addressed during session: Mobility/safety with mobility;Balance;Proper use of DME OT goals addressed during session: ADL's and self-care;Strengthening/ROM;Proper use of Adaptive equipment and DME      AM-PAC PT "6 Clicks" Daily Activity     Outcome Measure   Help from another person eating meals?: A Lot Help from another person taking care of personal grooming?: A Lot Help from another person toileting, which includes using toliet, bedpan, or urinal?: A Lot Help from another person bathing (including washing, rinsing, drying)?: A Lot Help from another person to put on and taking off regular upper body clothing?: A Lot Help from another person to put on and taking off regular lower body clothing?: Total 6 Click  Score: 11    End of Session Equipment Utilized During Treatment: Gait belt  OT Visit Diagnosis: Unsteadiness on feet (R26.81);Other abnormalities of gait and mobility (R26.89);Muscle weakness (generalized) (M62.81);Other symptoms and signs involving cognitive function   Activity Tolerance Patient tolerated treatment well   Patient Left in chair;with call bell/phone within reach;with chair alarm set;with nursing/sitter in room   Nurse Communication Mobility status;Precautions        Time: 2458-0998 OT Time Calculation (min): 23 min  Charges: OT General Charges $OT Visit: 1 Visit OT Treatments $Self Care/Home Management : 8-22 mins  Hulda Humphrey OTR/L [place] 05/12/2018, 12:05 PM

## 2018-05-13 LAB — CALCIUM: Calcium: 10.1 mg/dL (ref 8.9–10.3)

## 2018-05-13 LAB — GLUCOSE, CAPILLARY: GLUCOSE-CAPILLARY: 89 mg/dL (ref 65–99)

## 2018-05-13 MED ORDER — MAGNESIUM OXIDE 400 (241.3 MG) MG PO TABS: 400.0000 mg | ORAL_TABLET | Freq: Every day | ORAL | Status: DC

## 2018-05-13 MED ORDER — CALCIUM CARBONATE-VITAMIN D 500-200 MG-UNIT PO TABS: 2.0000 | ORAL_TABLET | Freq: Three times a day (TID) | ORAL | Status: DC

## 2018-05-13 NOTE — Progress Notes (Signed)
Occupational Therapy Treatment - Late Entry Patient Details Name: Henry Smith MRN: [id number] DOB: 1940-12-16 Today's Date: 05/13/2018    History of present illness Pt is a 77 y.o. male admitted 04/23/18 with AMS, including non-verbal, not walking and R-side facial droop. Worked up for encephalopathy secondary to sepsis due to UTI, AKI. Head CT and MRI show no acute intracranial finding; mild small vessel change normal for age. PMH includes dementia, schizophrenia, prostate CA. s/p parathyroidectomy, LEFT 5/29.    OT comments  Pt progressing towards OT goals this session. OT facilitated dressing, and transfer prior to dc to SNF. It was a pleasure to work with this patient. OT to dc and defer further OT services to SNF.   Follow Up Recommendations  SNF    Equipment Recommendations  None recommended by OT    Recommendations for Other Services      Precautions / Restrictions Precautions Precautions: Fall Restrictions Weight Bearing Restrictions: No       Mobility Bed Mobility Overal bed mobility: Needs Assistance Bed Mobility: Supine to Sit;Rolling Rolling: Min assist Sidelying to sit: Mod assist;+2 for physical assistance       General bed mobility comments: increased time and effort, step by step cues needed for participation, sequencing; difficulty processing   Transfers Overall transfer level: Needs assistance Equipment used: Rolling walker (2 wheeled);2 person hand held assist Transfers: Sit to/from Stand;Stand Pivot Transfers Sit to Stand: Min assist;+2 safety/equipment;+2 physical assistance Stand pivot transfers: +2 safety/equipment;Min assist       General transfer comment: sit to stand from bed and pivot to transfer stretcher    Balance Overall balance assessment: Needs assistance Sitting-balance support: Bilateral upper extremity supported;Feet supported;Feet unsupported Sitting balance-Leahy Scale: Fair Sitting balance - Comments: BUE support and min  guard for sitting EOB   Standing balance support: Bilateral upper extremity supported Standing balance-Leahy Scale: Poor Standing balance comment: dependent on BUE support and assist from staff                           ADL either performed or assessed with clinical judgement   ADL Overall ADL's : Needs assistance/impaired                 Upper Body Dressing : Maximal assistance;Sitting Upper Body Dressing Details (indicate cue type and reason): to don shirt Lower Body Dressing: Bed level;Total assistance Lower Body Dressing Details (indicate cue type and reason): rolling in bed to perform LB dressing               General ADL Comments: Pt continues to be very motivated     Technical [employer]wer Arousal/Alertness: Awake/alert Behavior During Therapy: Flat affect Overall Cognitive Status: Impaired/Different from baseline Area of Impairment: Following commands;Awareness;Problem solving;Attention                   Current Attention Level: Sustained   Following Commands: Follows one step commands with increased time Safety/Judgement: Decreased awareness of safety;Decreased awareness of deficits   Problem Solving: Slow processing;Decreased initiation;Requires verbal cues          Exercises     Shoulder Instructions       General Comments      Pertinent Vitals/ Pain       Pain Assessment: No/denies pain Faces Pain Scale: No hurt Pain Intervention(s): Monitored during session  Home Living  Prior Functioning/Environment              Frequency  Min 2X/week        Progress Toward Goals  OT Goals(current goals can now be found in the care plan section)  Progress towards OT goals: Progressing toward goals  Acute Rehab OT Goals Patient Stated Goal: get to SNF OT Goal Formulation: With patient Time For Goal Achievement:  05/13/18 Potential to Achieve Goals: [place] Discharge plan remains appropriate    Co-evaluation                 AM-PAC PT "6 Clicks" Daily Activity     Outcome Measure   Help from another person eating meals?: A Lot Help from another person taking care of personal grooming?: A Lot Help from another person toileting, which includes using toliet, bedpan, or urinal?: A Lot Help from another person bathing (including washing, rinsing, drying)?: A Lot Help from another person to put on and taking off regular upper body clothing?: A Lot Help from another person to put on and taking off regular lower body clothing?: Total 6 Click Score: 11    End of Session Equipment Utilized During Treatment: Gait belt  OT Visit Diagnosis: Unsteadiness on feet (R26.81);Other abnormalities of gait and mobility (R26.89);Muscle weakness (generalized) (M62.81);Other symptoms and signs involving cognitive function   Activity Tolerance Patient tolerated treatment well   Patient Left Other (comment)(on [facility] stretcher)   Nurse Communication Mobility status;Precautions        Time: 1610-9604 OT Time Calculation (min): 20 min  Charges: OT General Charges $OT Visit: 1 Visit OT Treatments $Self Care/Home Management : 8-22 mins  Hulda Humphrey OTR/L [place] 05/13/2018, 4:06 PM

## 2018-05-13 NOTE — Clinical Social Work Placement (Signed)
   CLINICAL SOCIAL WORK PLACEMENT  NOTE  Date:  05/13/2018  Patient Details  Name: Henry Smith MRN: [id number] Date of Birth: 1941/06/07  Clinical Social Work is seeking post-discharge placement for this patient at the [place] level of care (*CSW will initial, date and re-position this form in  chart as items are completed):      Patient/family provided with [facility] Work Department's list of facilities offering this level of care within the geographic area requested by the patient (or if unable, by the patient's family).  Yes   Patient/family informed of their freedom to choose among providers that offer the needed level of care, that participate in Medicare, Medicaid or managed care program needed by the patient, have an available bed and are willing to accept the patient.      Patient/family informed of [facility]'s ownership interest in [facility] and [facility], as well as of the fact that they are under no obligation to receive care at these facilities.  PASRR submitted to EDS on       PASRR number received on       Existing PASRR number confirmed on       FL2 transmitted to all facilities in geographic area requested by pt/family on       FL2 transmitted to all facilities within larger geographic area on       Patient informed that his/her managed care company has contracts with or will negotiate with certain facilities, including the following:        Yes   Patient/family informed of bed offers received.  Patient chooses bed at [place] [facility]     Physician recommends and patient chooses bed at      Patient to be transferred to [facility] on 05/13/18.  Patient to be transferred to facility by [facility]     Patient family notified on 05/13/18 of transfer.  Name of family member notified:  Erin     PHYSICIAN       Additional Comment:    _______________________________________________ Eileen Stanford, LCSW 05/13/2018, 11:10 AM

## 2018-05-13 NOTE — Care Management Note (Signed)
Case Management Note  Patient Details  Name: Henry Smith MRN: [id number] Date of Birth: July 03, 1941  Subjective/Objective:                    Action/Plan: Pt discharging back to [facility] today. CM signing off.   Expected Discharge Date:  05/13/18               Expected Discharge Plan:  Skilled Nursing Facility  In-House Referral:  Clinical Social Work  Discharge planning Services     Post Acute Care Choice:    Choice offered to:     DME Arranged:    DME Agency:     HH Arranged:    [place] Agency:     Status of Service:  Completed, signed off  If discussed at [employer] [facility] [employer], dates discussed:    Additional Comments:  Pollie Friar, RN 05/13/2018, 11:26 AM

## 2018-05-13 NOTE — Clinical Social Work Note (Addendum)
Clinical Social Worker facilitated patient discharge including contacting patient family and facility to confirm patient discharge plans.  Clinical information faxed to facility and family agreeable with plan.  CSW arranged ambulance transport via [facility] to [facility] in [place] bed 3.  RN to call [phone] for report prior to discharge.  Clinical Social Worker will sign off for now as social work intervention is no longer needed. Please consult [place] again if new need arises.  [place], [place]

## 2018-05-13 NOTE — Discharge Summary (Signed)
Physician Discharge Summary  Henry Smith ZOX:[id number] DOB: Sep 06, 1941 DOA: 04/23/2018  PCP: Caprice Renshaw, MD  Admit date: 04/23/2018 Discharge date: 05/13/2018  Admitted From: SNF Disposition:  SNF  Recommendations for Outpatient Follow-up:  1. Follow up with Dr. Rosendo Gros in 2 weeks  Home Health: none Equipment/Devices: none  Discharge Condition: stable CODE STATUS: Full code Diet recommendation: dysphagia 2, this liquids  HPI: Per Dr. Darrick Meigs, Henry Smith  is a 77 y.o. male, with history of schizophrenia, dementia associated with neurosyphilis, prostate cancer was brought to hospital with altered mental status.  Patient was initially  Nonverbal.  As per ED notes patient has been nonverbal drooping, right-sided facial droop not able to walk as per nursing home staff thought that has "schizophrenia might be acting up".  Patient was sent to ED for further evaluation. In the ED patient nods his head to yes and no questions, denies chest pain or shortness of breath.  Denies dysuria. No other history obtainable.  Patient does not know why he is in the hospital. He is a poor historian though he is more awake after he received IV fluids in the ED. Lab work showed abnormal UA, patient started on Azactam and Levaquin for sepsis due to UTI. Lactic acid elevated 5.2.  Creatinine 1.58, BUN 21   Hospital Course: Acute metabolic encephalopathy -In setting of acute infection in setting of underlying dementia secondary to neurosyphilis. MRI was negative for acute finding that could explain his mental status change. EEG without evidence of seizure activity. He was improving initially, but he had a setback where he became encephalopathic again due to his hyponatremia and hypercalcemia, after correction of his hypercalcemia and hyponatremia, appears he is improving more awake and appropriate.  Hyperparathyroidism/hypercalcemia -Being followed by endocrinology Dr. Gabriel Carina at 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 General surgery consulted and patient underwent parathyroidectomy on 5/20 9 am by Dr. Rosendo Gros, his calcium improved to 10 on discharge. Placed on Os-Cal by surgical team. Follow up as an outpatient with Dr. Rosendo Gros in 2 weeks Sepsis/Bacteremia/UTI secondary to Proteus Mirabilis -Sensitive to ceftriaxone. Completed the course.  Hypernatremia -Worsened with change in mental status. Na now normal.  Schizoaffective disorder -continue outpatient psychiatry care Dysphagia -Possibly related to underlying dementia and schizophrenia vs metabolic encephalopathy from infection. Continue dysphagia 2 and ongoing SLP therapy Acute kidney injury -Resolved. Essential hypertension -Labile, overall acceptable, hold losartan on d/c, monitor BP and resume if needed Thrombocytopenia -Chronic. Stable, no bleeding Hypokalemia -Resolved with supplementation    Discharge Diagnoses:  Principal Problem:   Altered mental status, unspecified Active Problems:   Schizoaffective disorder, bipolar type (HCC)   Sepsis secondary to UTI [facility])   UTI (urinary tract infection)   Acute metabolic encephalopathy   Proteus mirabilis infection   Bacteremia   Sepsis due to Gram negative bacteria [facility])     Discharge Instructions   Allergies as of 05/13/2018      Reactions   Penicillins Swelling   Has patient had a PCN reaction causing immediate rash, facial/tongue/throat swelling, SOB or lightheadedness with hypotension: Yes Has patient had a PCN reaction causing severe rash involving mucus membranes or skin necrosis: No Has patient had a PCN reaction that required hospitalization: No Has patient had a PCN reaction occurring within the last 10 years: No If all of the above answers are "  NO", then may proceed with  Cephalosporin use.      Medication List    STOP taking these medications   losartan 25 MG tablet Commonly known as:  COZAAR     TAKE these medications   amantadine 100 MG capsule Commonly known as:  SYMMETREL Take 1 capsule (100 mg total) by mouth 2 (two) times daily.   calcium-vitamin D 500-200 MG-UNIT tablet Commonly known as:  OSCAL WITH D Take 2 tablets by mouth 3 (three) times daily.   divalproex 500 MG DR tablet Commonly known as:  DEPAKOTE Take 500 mg by mouth 2 (two) times daily. Give 500mg  by mouth two times a day for bipolar disorder   gabapentin 300 MG capsule Commonly known as:  NEURONTIN Take 300 mg by mouth 3 (three) times daily.   haloperidol decanoate 100 MG/ML injection Commonly known as:  HALDOL DECANOATE Inject 1 mL (100 mg total) into the muscle every 28 (twenty-eight) days. Next injection due 01/31/18   latanoprost 0.005 % ophthalmic solution Commonly known as:  XALATAN Place 1 drop into both eyes at bedtime.   magnesium oxide 400 (241.3 Mg) MG tablet Commonly known as:  MAG-OX Take 1 tablet (400 mg total) by mouth daily.   Melatonin 5 MG Tabs Take 5 mg by mouth at bedtime.       Contact information for follow-up providers    Ralene Ok, MD Follow up.   Specialty:  General Surgery Why:  [facility]All for follow up appointment in 2 weeks after discharge from the hospital.  He will need a calcium and PTH 2-3 days prior to his follow up. Results should be sent to our office.   Contact information: [address] [place] [address] [phone]            Contact information for after-discharge care    Destination    [facility] .   Service:  Skilled Nursing Contact information: [address] [place] [phone]                  Consultations:  General surgery  Psychiatry   Neurology  Procedures/Studies:  2D echo Study Conclusions - Left ventricle: The cavity  size was normal. There was mild concentric hypertrophy. Systolic function was normal. The estimated ejection fraction was in the range of 60% to 65%. Wall motion was normal; there were no regional wall motion abnormalities. Doppler parameters are consistent with abnormal left ventricular relaxation (grade 1 diastolic dysfunction). Doppler parameters are consistent with elevated ventricular end-diastolic filling pressure. - Aortic valve: There was mild regurgitation. Valve area (Vmax):  2.02 cm^2. - Aortic root: The aortic root was normal in size. - Mitral valve: Structurally normal valve. There was no regurgitation. - Right ventricle: The cavity size was normal. Wall thickness was normal. Systolic function was normal. - Tricuspid valve: There was mild regurgitation. - Pulmonary arteries: Systolic pressure was within the normal range. - Inferior vena cava: The vessel was normal in size. - Pericardium, extracardiac: There was no pericardial effusion.   EEG IMPRESSION:  This is an abnormal EEG demonstrating a mild to moderate diffuse slowing of electrocerebral activity.  This can be seen in a wide variety of encephalopathic state including those of a toxic, metabolic, or degenerative nature.  There were no focal, hemispheric, or lateralizing features.  No epileptiform activity was recorded.   parathyroidectomy   Ct Angio Head W Or Wo Contrast  Result Date: 04/24/2018 CLINICAL DATA:  Altered mental status. Weakness and facial droop for 1 week. Urosepsis. History of schizophrenia, dementia with neurosyphilis, prostate cancer. EXAM: CT ANGIOGRAPHY HEAD AND NECK TECHNIQUE: Multidetector CT imaging of the head and neck was performed using the standard protocol during bolus administration of intravenous contrast. Multiplanar CT image reconstructions and MIPs were obtained to evaluate the vascular anatomy. Carotid stenosis measurements (when applicable) are obtained utilizing NASCET criteria, using the  distal internal carotid diameter as the denominator. CONTRAST:  18mL ISOVUE-370 IOPAMIDOL (ISOVUE-370) INJECTION 76% COMPARISON:  CT angiogram head and neck January 28, 2017 FINDINGS: CTA NECK FINDINGS: AORTIC ARCH: Normal appearance of the thoracic arch, normal branch pattern. Mild calcific atherosclerosis aortic arch. The origins of the innominate, left Common carotid artery and subclavian artery are widely patent. RIGHT CAROTID SYSTEM: Common carotid artery is patent. Normal appearance of the carotid bifurcation without hemodynamically significant stenosis by NASCET criteria. Normal appearance of the internal carotid artery, developmentally smaller. LEFT CAROTID SYSTEM: Common carotid artery is patent. Normal appearance of the carotid bifurcation without hemodynamically significant stenosis by NASCET criteria. Normal appearance of the internal carotid artery. VERTEBRAL ARTERIES:Proximal LEFT vertebral artery obscured by streak artifact from venous contamination. Symmetric patent vertebral artery's without luminal irregularity. SKELETON: No acute osseous process though bone windows have not been submitted. Severe degenerative change of the cervical spine. OTHER NECK: Soft tissues of the neck are nonacute though, not tailored for evaluation. UPPER CHEST: Minimal debris RIGHT mainstem bronchus. CTA HEAD FINDINGS: ANTERIOR CIRCULATION: Patent cervical internal carotid arteries, petrous, cavernous and supra clinoid internal carotid arteries. Patent anterior communicating artery. Patent anterior and middle cerebral arteries, mild luminal irregularity compatible with atherosclerosis. No large vessel occlusion, significant stenosis, contrast extravasation or aneurysm. POSTERIOR CIRCULATION: Patent vertebral arteries, vertebrobasilar junction and basilar artery, as well as main branch vessels. Patent posterior cerebral arteries, mild luminal irregularity compatible with atherosclerosis. Small LEFT posterior communicating  artery present. No large vessel occlusion, significant stenosis, contrast extravasation or aneurysm. VENOUS SINUSES: Major dural venous sinuses are patent though not tailored for evaluation on this angiographic examination. ANATOMIC VARIANTS: Hypoplastic RIGHT A1 segment. DELAYED PHASE: No abnormal intracranial enhancement. MIP images reviewed. IMPRESSION: CTA NECK: 1. No hemodynamically significant stenosis or acute vascular process in the neck. 2. Minimal debris RIGHT mainstem bronchus concerning for aspiration. CTA HEAD: 1. No emergent large vessel occlusion or flow limiting stenosis. 2. Mild intracranial atherosclerosis. Aortic Atherosclerosis (ICD10-I70.0). Electronically Signed   By: Elon Alas M.D.   On: 04/24/2018 02:27   Dg Chest 1 View  Result Date: 04/23/2018 CLINICAL DATA:  Weakness for 1 week. EXAM: CHEST  1 VIEW COMPARISON:  10/29/2017 and prior chest radiographs FINDINGS: This is a low volume film. Cardiomediastinal silhouette is unchanged with fullness of the SUPERIOR mediastinum again noted since 2017. Mild LEFT basilar scarring again identified. There is no evidence of focal airspace disease, pulmonary edema, suspicious pulmonary nodule/mass, pleural effusion, or pneumothorax. No acute bony abnormalities are identified. IMPRESSION: No evidence of acute cardiopulmonary disease. Electronically Signed   By: Margarette Canada M.D.   On: 04/23/2018 15:44   Ct Head Wo Contrast  Result Date: 04/23/2018 CLINICAL DATA:  77 year old male with altered mental status for 1 week. Weakness and facial droop. EXAM: CT HEAD WITHOUT CONTRAST TECHNIQUE: Contiguous axial images were obtained from the base of the skull through the vertex without intravenous contrast. COMPARISON:  10/27/2017 and prior CTs FINDINGS: Brain: No evidence of acute infarction, hemorrhage, hydrocephalus, extra-axial collection or mass lesion/mass effect. Mild chronic small-vessel white matter  ischemic changes are again identified.  Vascular: No hyperdense vessel or unexpected calcification. Skull: No acute abnormality. Small sclerotic foci within the visualized calvarium are unchanged from 2017. Sinuses/Orbits: No acute finding. Other: None. IMPRESSION: 1. No evidence of acute intracranial abnormality 2. Mild chronic small-vessel white matter ischemic changes. Electronically Signed   By: Margarette Canada M.D.   On: 04/23/2018 16:02   Ct Angio Neck W Or Wo Contrast  Result Date: 04/24/2018 CLINICAL DATA:  Altered mental status. Weakness and facial droop for 1 week. Urosepsis. History of schizophrenia, dementia with neurosyphilis, prostate cancer. EXAM: CT ANGIOGRAPHY HEAD AND NECK TECHNIQUE: Multidetector CT imaging of the head and neck was performed using the standard protocol during bolus administration of intravenous contrast. Multiplanar CT image reconstructions and MIPs were obtained to evaluate the vascular anatomy. Carotid stenosis measurements (when applicable) are obtained utilizing NASCET criteria, using the distal internal carotid diameter as the denominator. CONTRAST:  10mL ISOVUE-370 IOPAMIDOL (ISOVUE-370) INJECTION 76% COMPARISON:  CT angiogram head and neck January 28, 2017 FINDINGS: CTA NECK FINDINGS: AORTIC ARCH: Normal appearance of the thoracic arch, normal branch pattern. Mild calcific atherosclerosis aortic arch. The origins of the innominate, left Common carotid artery and subclavian artery are widely patent. RIGHT CAROTID SYSTEM: Common carotid artery is patent. Normal appearance of the carotid bifurcation without hemodynamically significant stenosis by NASCET criteria. Normal appearance of the internal carotid artery, developmentally smaller. LEFT CAROTID SYSTEM: Common carotid artery is patent. Normal appearance of the carotid bifurcation without hemodynamically significant stenosis by NASCET criteria. Normal appearance of the internal carotid artery. VERTEBRAL ARTERIES:Proximal LEFT vertebral artery obscured by streak  artifact from venous contamination. Symmetric patent vertebral artery's without luminal irregularity. SKELETON: No acute osseous process though bone windows have not been submitted. Severe degenerative change of the cervical spine. OTHER NECK: Soft tissues of the neck are nonacute though, not tailored for evaluation. UPPER CHEST: Minimal debris RIGHT mainstem bronchus. CTA HEAD FINDINGS: ANTERIOR CIRCULATION: Patent cervical internal carotid arteries, petrous, cavernous and supra clinoid internal carotid arteries. Patent anterior communicating artery. Patent anterior and middle cerebral arteries, mild luminal irregularity compatible with atherosclerosis. No large vessel occlusion, significant stenosis, contrast extravasation or aneurysm. POSTERIOR CIRCULATION: Patent vertebral arteries, vertebrobasilar junction and basilar artery, as well as main branch vessels. Patent posterior cerebral arteries, mild luminal irregularity compatible with atherosclerosis. Small LEFT posterior communicating artery present. No large vessel occlusion, significant stenosis, contrast extravasation or aneurysm. VENOUS SINUSES: Major dural venous sinuses are patent though not tailored for evaluation on this angiographic examination. ANATOMIC VARIANTS: Hypoplastic RIGHT A1 segment. DELAYED PHASE: No abnormal intracranial enhancement. MIP images reviewed. IMPRESSION: CTA NECK: 1. No hemodynamically significant stenosis or acute vascular process in the neck. 2. Minimal debris RIGHT mainstem bronchus concerning for aspiration. CTA HEAD: 1. No emergent large vessel occlusion or flow limiting stenosis. 2. Mild intracranial atherosclerosis. Aortic Atherosclerosis (ICD10-I70.0). Electronically Signed   By: Elon Alas M.D.   On: 04/24/2018 02:27   Mr Brain Wo Contrast  Result Date: 04/24/2018 CLINICAL DATA:  Motor aphasia.  Right-sided hemiparesis. EXAM: MRI HEAD WITHOUT CONTRAST MRA HEAD WITHOUT CONTRAST TECHNIQUE: Multiplanar,  multiecho pulse sequences of the brain and surrounding structures were obtained without intravenous contrast. Angiographic images of the head were obtained using MRA technique without contrast. COMPARISON:  CT same day.  MRI 05/06/2017. FINDINGS: MRI HEAD FINDINGS Brain: Diffusion imaging does not show any acute or subacute infarction. The brainstem and cerebellum are normal. Cerebral hemispheres show generalized atrophy with a few old small vessel  insults within the deep white matter. No cortical or large vessel territory infarction. No mass lesion, hemorrhage, hydrocephalus or extra-axial collection. Vascular: Major vessels at the base of the brain show flow. Skull and upper cervical spine: Negative Sinuses/Orbits: Clear/normal Other: None MRA HEAD FINDINGS Both internal carotid arteries are widely patent through the siphon region. Mild atherosclerotic irregularity but no flow limiting stenosis. Right internal carotid artery supplies only the right middle cerebral artery territory. Left ICA supplies the left MCA and both anterior cerebral artery territories. Distal branch vessels show some atherosclerotic irregularity. Both vertebral arteries widely patent to the basilar. No basilar stenosis. Posterior circulation branch vessels appear normal, except for mild atherosclerotic irregularity of the more distal PCA branches. IMPRESSION: No acute intracranial finding. Normal for age, with mild small vessel change of the cerebral hemispheric deep white matter. MR angiography does not show any large or medium vessel occlusion or correctable proximal stenosis. 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 Mild atherosclerotic irregularity of the more distal branch vessels. Electronically Signed   By: Nelson Chimes M.D.   On: 04/24/2018 09:12   Nm Parathyroid W/spect  Result Date: 05/05/2018 CLINICAL DATA:   Hyperparathyroidism EXAM: NM PARATHYROID SCINTIGRAPHY AND SPECT IMAGING TECHNIQUE: Following intravenous administration of radiopharmaceutical, early and 2-hour delayed planar images were obtained in the anterior projection. Delayed triplanar SPECT images were also obtained at 2 hours. RADIOPHARMACEUTICALS:  25.2 mCi Tc-75m Sestamibi IV COMPARISON:  None FINDINGS: Planar imaging: Normal initial distribution of sestamibi within the thyroid lobes. Normal washout of tracer from thyroid tissue with a focal abnormal retained sestamibi at the upper pole of the LEFT thyroid lobe suspicious for parathyroid adenoma. No ectopic localization of sestamibi within the mediastinum. SPECT imaging: Abnormal focus of retained sestamibi identified at the upper to mid LEFT thyroid lobe consistent with a LEFT superior parathyroid adenoma. No abnormal sestamibi retention at the remaining parathyroid glands. No ectopic localization of tracer within the mediastinum. IMPRESSION: Abnormal retained sestamibi at the upper pole of the LEFT thyroid lobe consistent with a LEFT superior parathyroid adenoma. Electronically Signed   By: Lavonia Dana M.D.   On: 05/05/2018 15:15   Mr Jodene Nam Head/brain JQ Cm  Result Date: 04/24/2018 CLINICAL DATA:  Motor aphasia.  Right-sided hemiparesis. EXAM: MRI HEAD WITHOUT CONTRAST MRA HEAD WITHOUT CONTRAST TECHNIQUE: Multiplanar, multiecho pulse sequences of the brain and surrounding structures were obtained without intravenous contrast. Angiographic images of the head were obtained using MRA technique without contrast. COMPARISON:  CT same day.  MRI 05/06/2017. FINDINGS: MRI HEAD FINDINGS Brain: Diffusion imaging does not show any acute or subacute infarction. The brainstem and cerebellum are normal. Cerebral hemispheres show generalized atrophy with a few old small vessel insults within the deep white matter. No cortical or large vessel territory infarction. No mass lesion, hemorrhage, hydrocephalus or  extra-axial collection. Vascular: Major vessels at the base of the brain show flow. Skull and upper cervical spine: Negative Sinuses/Orbits: Clear/normal Other: None MRA HEAD FINDINGS Both internal carotid arteries are widely patent through the siphon region. Mild atherosclerotic irregularity but no flow limiting stenosis. Right internal carotid artery supplies only the right middle cerebral artery territory. Left ICA supplies the left MCA and both anterior cerebral artery territories. Distal branch vessels show some atherosclerotic irregularity. Both vertebral arteries widely patent to the basilar. No basilar stenosis. Posterior circulation branch vessels appear normal, except for mild atherosclerotic irregularity of the more distal  PCA branches. IMPRESSION: No acute intracranial finding. Normal for age, with mild small vessel change of the cerebral hemispheric deep white matter. MR angiography does not show any large or medium vessel occlusion or correctable proximal stenosis. 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 Mild atherosclerotic irregularity of the more distal branch vessels. Electronically Signed   By: Nelson Chimes M.D.   On: 04/24/2018 09:12     Subjective: No complaints  Discharge Exam: Vitals:   05/13/18 0411 05/13/18 0727  BP: 100/66 95/61  Pulse: (!) 109 (!) 104  Resp: 18 17  Temp: 99.2 F (37.3 C) 99.2 F (37.3 C)  SpO2: 95% 97%    General: Pt is alert, awake, not in acute distress Cardiovascular: RRR, S1/S2 +, no rubs, no gallops Respiratory: CTA bilaterally    The results of significant diagnostics from this hospitalization (including imaging, microbiology, ancillary and laboratory) are listed below for reference.     Microbiology: No results found for this or any previous visit (from the past 240 hour(s)).   Labs: BNP (last 3 results) No results for  input(s): BNP in the last 8760 hours. Basic Metabolic Panel: Recent Labs  Lab 05/08/18 0414 05/09/18 0348 05/10/18 0557 05/11/18 0630 05/11/18 1912 05/12/18 0603 05/13/18 0415  NA 141 143 142 142  --  141  --   K 3.6 3.6 3.9 4.6  --  4.4  --   CL 111 112* 110 110  --  108  --   CO2 25 26 28 26   --  30  --   GLUCOSE 86 132* 100* 109*  --  87  --   BUN 12 16 13 17   --  20  --   CREATININE 0.74 0.82 0.82 0.76  --  0.94  --   CALCIUM 11.0* 11.2* 11.5* 11.7* 11.2* 11.4* 10.1   Liver Function Tests: Recent Labs  Lab 05/11/18 0630 05/12/18 0603  AST 31 26  ALT 53 44  ALKPHOS 75 68  BILITOT 0.8 0.6  PROT 6.4* 6.4*  ALBUMIN 2.5* 2.7*   No results for input(s): LIPASE, AMYLASE in the last 168 hours. No results for input(s): AMMONIA in the last 168 hours. CBC: Recent Labs  Lab 05/07/18 0617 05/11/18 0630 05/12/18 0603  WBC 7.6 5.4 10.0  HGB 12.8* 12.4* 12.0*  HCT 40.4 39.1 37.3*  MCV 86.3 86.1 87.6  PLT 143* 139* 136*   Cardiac Enzymes: No results for input(s): CKTOTAL, CKMB, CKMBINDEX, TROPONINI in the last 168 hours. BNP: Invalid input(s): POCBNP CBG: Recent Labs  Lab 05/12/18 0617 05/12/18 1113 05/12/18 1640 05/12/18 2134 05/13/18 0637  GLUCAP 79 87 123* 131* 89   D-Dimer No results for input(s): DDIMER in the last 72 hours. Hgb A1c No results for input(s): HGBA1C in the last 72 hours. Lipid Profile No results for input(s): CHOL, HDL, LDLCALC, TRIG, CHOLHDL, LDLDIRECT in the last 72 hours. Thyroid function studies No results for input(s): TSH, T4TOTAL, T3FREE, THYROIDAB in the last 72 hours.  Invalid input(s): FREET3 Anemia work up No results for input(s): VITAMINB12, FOLATE, FERRITIN, TIBC, IRON, RETICCTPCT in the last 72 hours. Urinalysis    Component Value Date/Time   COLORURINE YELLOW 04/23/2018 1540   APPEARANCEUR TURBID (A) 04/23/2018 1540   LABSPEC 1.021 04/23/2018 1540   PHURINE 5.0 04/23/2018 1540   GLUCOSEU 50 (A) 04/23/2018 1540    HGBUR LARGE (A) 04/23/2018 1540   BILIRUBINUR NEGATIVE  04/23/2018 1540   [place] 04/23/2018 1540   PROTEINUR 100 (A) 04/23/2018 1540   NITRITE NEGATIVE 04/23/2018 1540   LEUKOCYTESUR MODERATE (A) 04/23/2018 1540   Sepsis Labs Invalid input(s): PROCALCITONIN,  WBC,  LACTICIDVEN   Time coordinating discharge: 35 minutes  SIGNED:  Marzetta Board, MD  [facility] 05/13/2018, 10:23 AM Pager [phone]  If 7PM-7AM, please contact night-coverage [email or web address] Password [email or web address]

## 2018-05-13 NOTE — Progress Notes (Signed)
Patient discharging to [facility] in [place] [employer]. All belongings sent with patient. Nurse will call report.

## 2018-05-13 NOTE — Progress Notes (Signed)
Patient ID: Henry Smith, male   DOB: 1941-10-28, 77 y.o.   MRN: [id number]  Calcium down today Have patient f/u with Dr. Rosendo Gros as an outpt

## 2018-05-19 IMAGING — CT CT HEAD W/O CM
3 series · 14 of 47 positions shown, 16 images · non-contrast
Comparison: 10/26/2016

CLINICAL DATA: Recurrent falls, poor historian, possible head
injury

EXAM:
CT HEAD WITHOUT CONTRAST
TECHNIQUE: Contiguous axial images were obtained from the base of the skull
through the vertex without intravenous contrast.

[Series 2: head wo · axial · 0.45mm/px · z∈[+492,+622]mm · 8 of 32 slices shown, 10 images]
[im 3/32  brain]
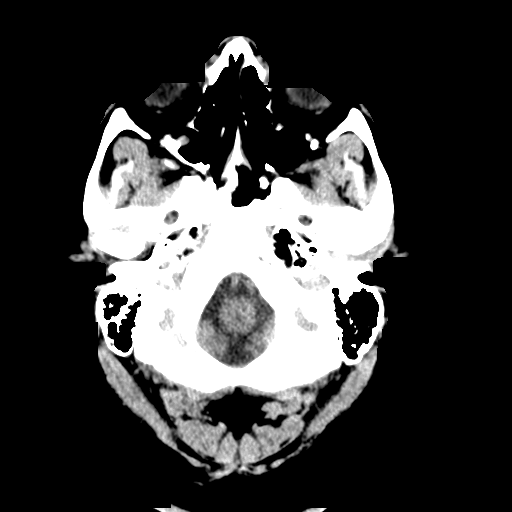
[im 3/32  bone]
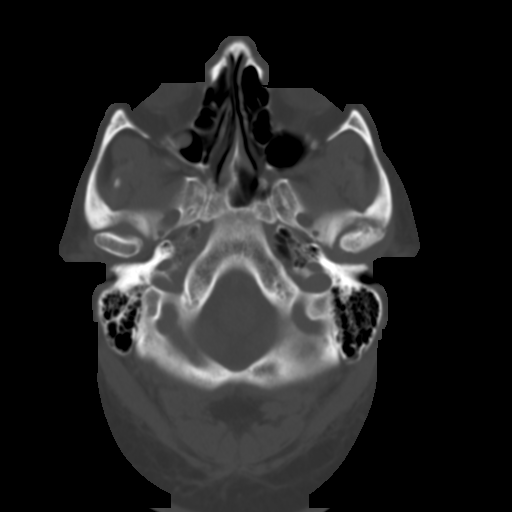
[im 7/32  brain]
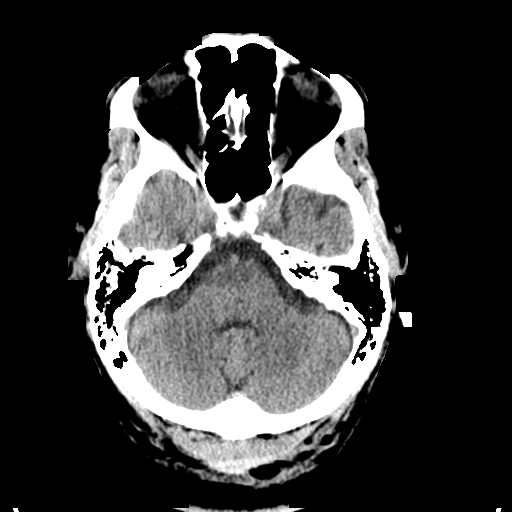
[im 10/32  brain]
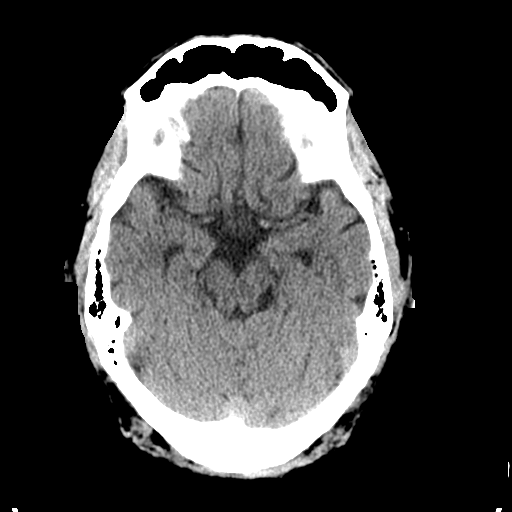
[im 14/32  brain]
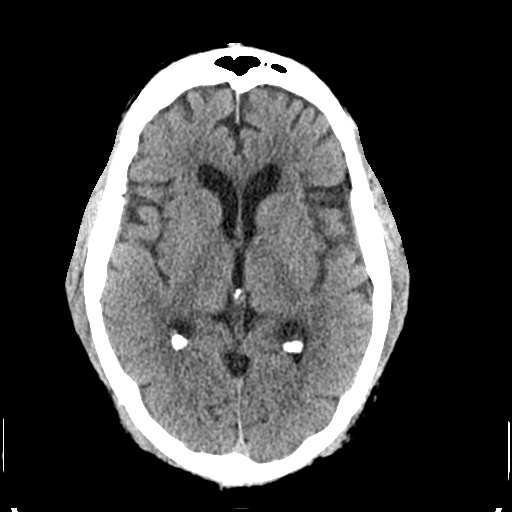
[im 18/32  brain]
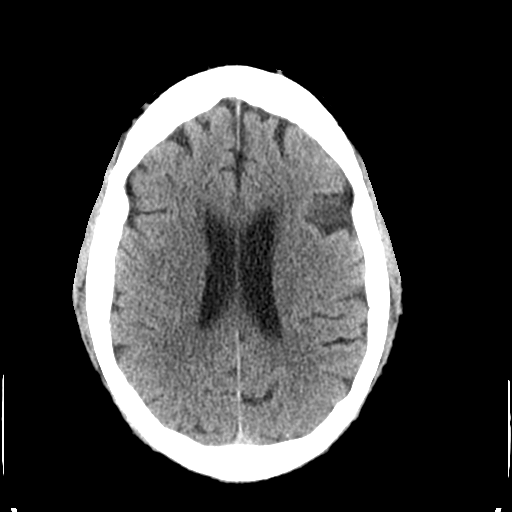
[im 18/32  bone]
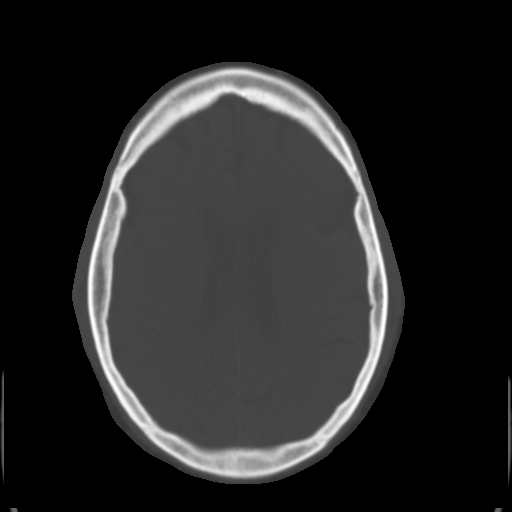
[im 22/32  brain]
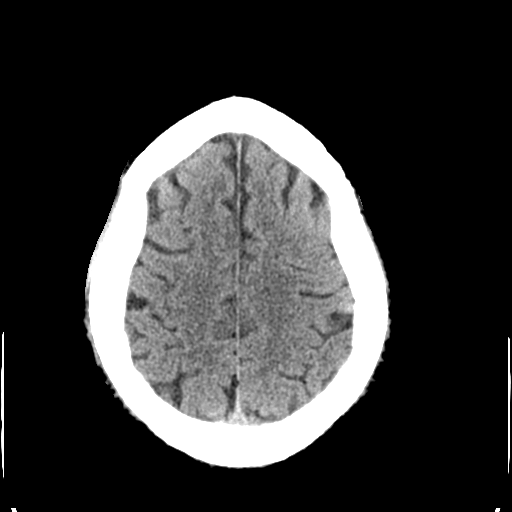
[im 25/32  brain]
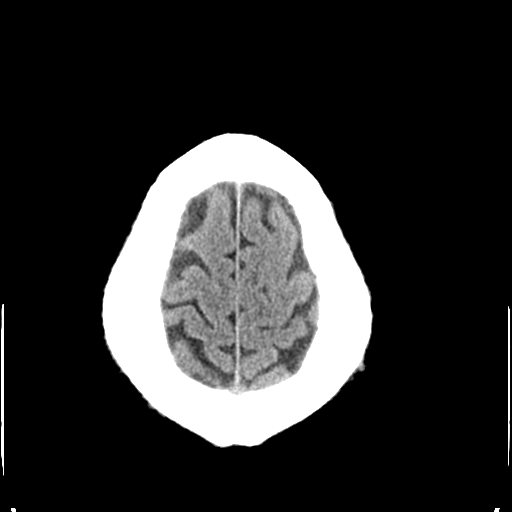
[im 29/32  brain]
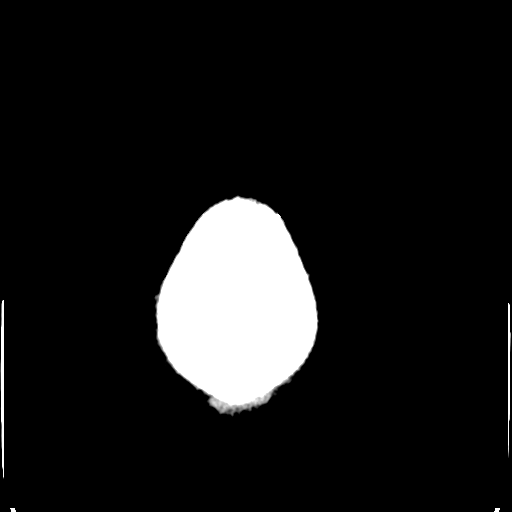

[Series 4: coronal soft tissue · coronal · 0.32mm/px · 3 of 75 slices shown]
[im 25/75  brain]
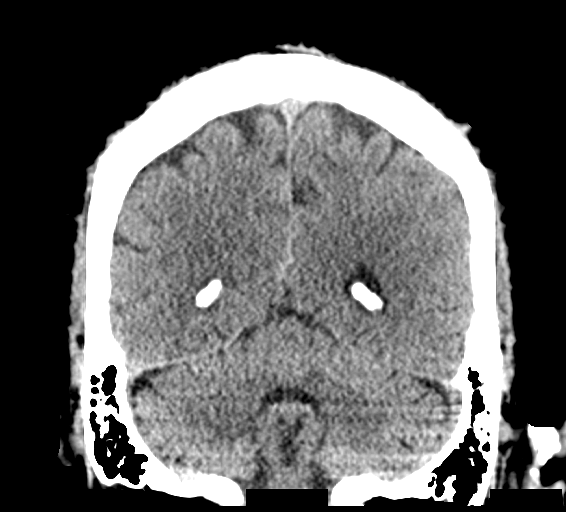
[im 33/75  brain]
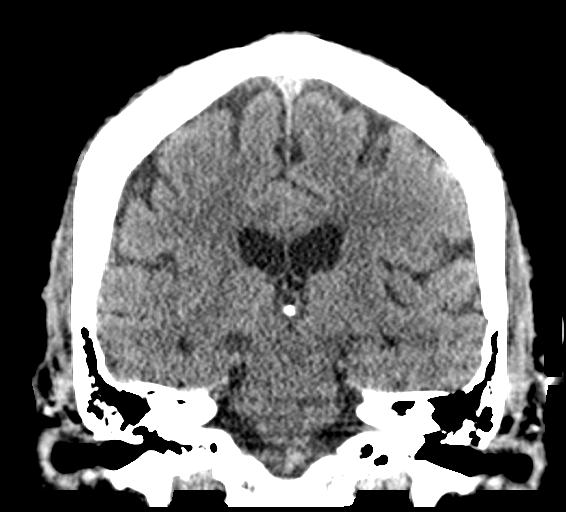
[im 42/75  brain]
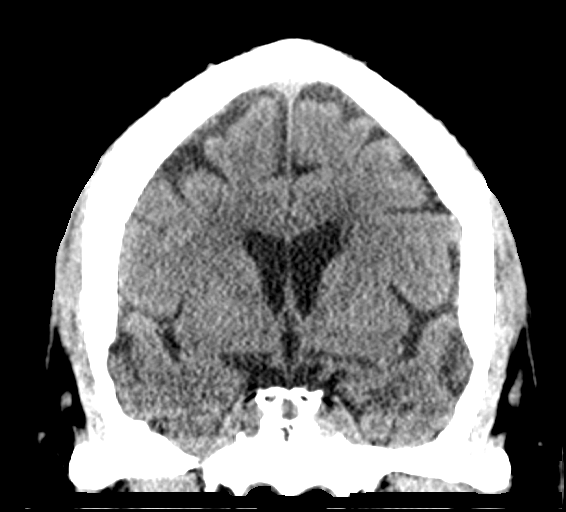

[Series 5: sagittal soft tissue · sagittal · 0.29mm/px · 3 of 55 slices shown]
[im 19/55  brain]
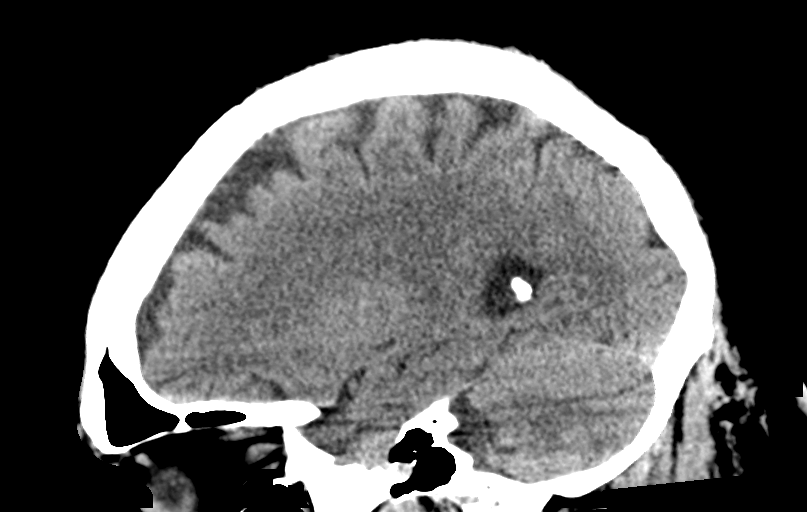
[im 28/55  brain]
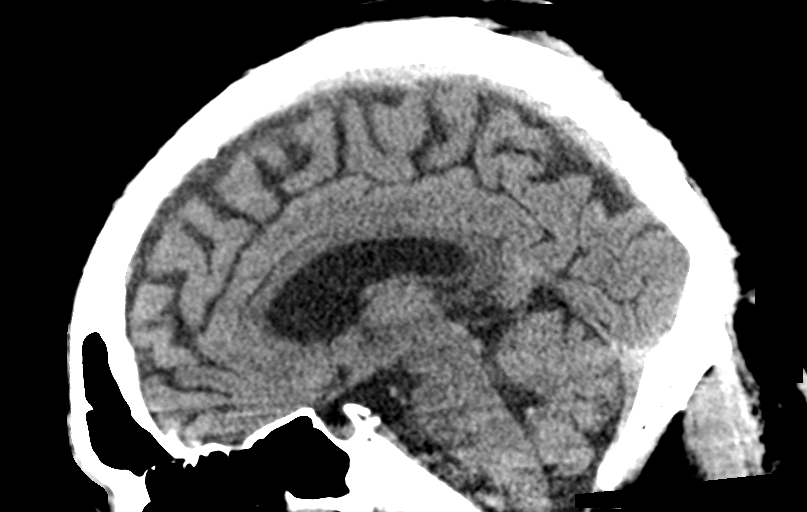
[im 37/55  brain]
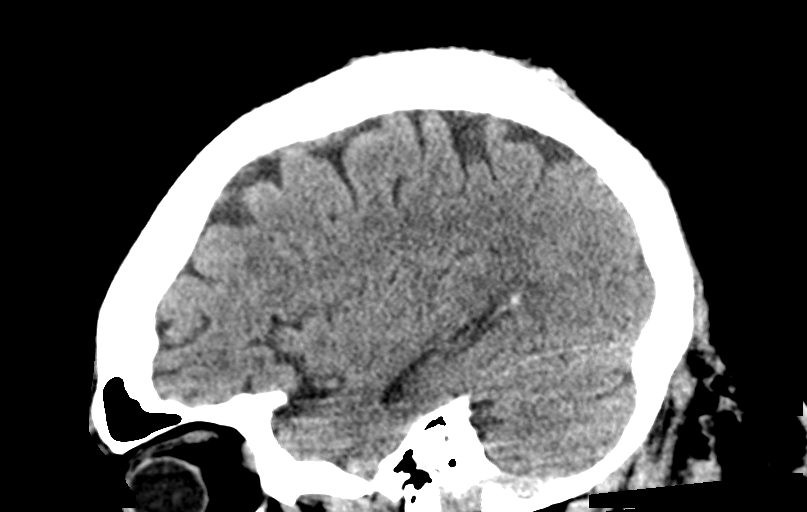

[14 of 47 positions shown; findings below may reference images not displayed]

FINDINGS: Brain: Stable brain atrophy and chronic white matter microvascular
ischemic changes throughout the cerebral hemispheres. No acute
intracranial hemorrhage, mass lesion, new infarction, midline shift,
herniation, hydrocephalus, or extra-axial fluid collection. Normal
gray-white matter differentiation. Cisterns are patent. Cerebellar
atrophy as well.

Vascular: None.

Skull: No acute finding. Negative for fracture. Stable small
scattered indeterminate sclerotic foci.

Sinuses/Orbits: Unremarkable orbits. Small retention cyst or polyp
measuring 7 mm in the right maxillary sinus, image 6. Sinuses and
mastoid otherwise clear.

Other: None.
IMPRESSION: Stable brain atrophy and chronic white matter microvascular changes.

No acute intracranial abnormality or interval change by noncontrast
CT.

## 2018-07-31 ENCOUNTER — Other Ambulatory Visit: Payer: Self-pay

## 2018-07-31 ENCOUNTER — Emergency Department (HOSPITAL_COMMUNITY)
Admission: EM | Admit: 2018-07-31 | Discharge: 2018-08-01 | Disposition: A | Discharge status: Home / Self Care | Payer: Medicare Other | Attending: Emergency Medicine | Admitting: Emergency Medicine

## 2018-07-31 ENCOUNTER — Encounter (HOSPITAL_COMMUNITY): Payer: Self-pay | Admitting: *Deleted

## 2018-07-31 DIAGNOSIS — M25562 Pain in left knee: Secondary | ICD-10-CM | POA: Diagnosis present

## 2018-07-31 DIAGNOSIS — F209 Schizophrenia, unspecified: Secondary | ICD-10-CM | POA: Diagnosis not present

## 2018-07-31 DIAGNOSIS — Z79899 Other long term (current) drug therapy: Secondary | ICD-10-CM | POA: Insufficient documentation

## 2018-07-31 DIAGNOSIS — Z8546 Personal history of malignant neoplasm of prostate: Secondary | ICD-10-CM | POA: Insufficient documentation

## 2018-07-31 DIAGNOSIS — F17210 Nicotine dependence, cigarettes, uncomplicated: Secondary | ICD-10-CM | POA: Diagnosis not present

## 2018-07-31 DIAGNOSIS — F0390 Unspecified dementia without behavioral disturbance: Secondary | ICD-10-CM | POA: Diagnosis not present

## 2018-07-31 DIAGNOSIS — Z043 Encounter for examination and observation following other accident: Secondary | ICD-10-CM | POA: Insufficient documentation

## 2018-07-31 DIAGNOSIS — W19XXXA Unspecified fall, initial encounter: Secondary | ICD-10-CM

## 2018-07-31 NOTE — ED Triage Notes (Signed)
Pt brought in by [facility] for c/o fall; pt denies any pain

## 2018-07-31 NOTE — ED Notes (Signed)
Pt found lying in floor in bathroom covered in stool. Dr. Alvino Chapel aware and to bedside. No injuries noted. Pt cleaned and placed in wheelchair and placed in front of nurses desk for close observation while he remains here.
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
## 2019-02-06 ENCOUNTER — Telehealth: Payer: Self-pay | Admitting: Orthopedic Surgery
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
## 2019-06-12 ENCOUNTER — Encounter: Payer: Self-pay | Admitting: Orthopedic Surgery

## 2019-06-12 ENCOUNTER — Ambulatory Visit (INDEPENDENT_AMBULATORY_CARE_PROVIDER_SITE_OTHER): Payer: Medicare Other

## 2019-06-12 ENCOUNTER — Other Ambulatory Visit: Payer: Self-pay

## 2019-06-12 ENCOUNTER — Ambulatory Visit (INDEPENDENT_AMBULATORY_CARE_PROVIDER_SITE_OTHER): Payer: Medicare Other | Admitting: Orthopedic Surgery

## 2019-06-12 VITALS — BP 147/72 | HR 53 | Temp 96.4°F | Ht 71.0 in | Wt 207.0 lb

## 2019-06-12 DIAGNOSIS — M25562 Pain in left knee: Secondary | ICD-10-CM

## 2019-06-12 NOTE — Patient Instructions (Signed)
Use Tylenol for arthritis pain 500 mg every 6 hours as needed  Wear stability brace left knee for ambulation  Patient is not a good surgical candidate
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
## 2019-07-16 ENCOUNTER — Emergency Department (HOSPITAL_COMMUNITY): Payer: Medicare Other

## 2019-07-16 ENCOUNTER — Emergency Department (HOSPITAL_COMMUNITY)
Admission: EM | Admit: 2019-07-16 | Discharge: 2019-07-16 | Disposition: A | Discharge status: Skilled Nursing Facility | Payer: Medicare Other | Attending: Emergency Medicine | Admitting: Emergency Medicine

## 2019-07-16 ENCOUNTER — Encounter (HOSPITAL_COMMUNITY): Payer: Self-pay

## 2019-07-16 ENCOUNTER — Other Ambulatory Visit: Payer: Self-pay

## 2019-07-16 DIAGNOSIS — Z79899 Other long term (current) drug therapy: Secondary | ICD-10-CM | POA: Insufficient documentation

## 2019-07-16 DIAGNOSIS — N390 Urinary tract infection, site not specified: Secondary | ICD-10-CM

## 2019-07-16 DIAGNOSIS — F17210 Nicotine dependence, cigarettes, uncomplicated: Secondary | ICD-10-CM | POA: Diagnosis not present

## 2019-07-16 DIAGNOSIS — A5210 Symptomatic neurosyphilis, unspecified: Secondary | ICD-10-CM | POA: Insufficient documentation

## 2019-07-16 DIAGNOSIS — R4182 Altered mental status, unspecified: Secondary | ICD-10-CM | POA: Diagnosis present

## 2019-07-16 DIAGNOSIS — F0280 Dementia in other diseases classified elsewhere without behavioral disturbance: Secondary | ICD-10-CM | POA: Diagnosis not present

## 2019-07-16 LAB — URINALYSIS, ROUTINE W REFLEX MICROSCOPIC
Bilirubin Urine: NEGATIVE
Glucose, UA: NEGATIVE mg/dL
Hgb urine dipstick: NEGATIVE
Ketones, ur: NEGATIVE mg/dL
Nitrite: NEGATIVE
Protein, ur: NEGATIVE mg/dL
Specific Gravity, Urine: 1.024 (ref 1.005–1.030)
pH: 5 (ref 5.0–8.0)

## 2019-07-16 LAB — CBC WITH DIFFERENTIAL/PLATELET
Abs Immature Granulocytes: 0.01 10*3/uL (ref 0.00–0.07)
Basophils Absolute: 0 10*3/uL (ref 0.0–0.1)
Basophils Relative: 0 %
Eosinophils Absolute: 0.1 10*3/uL (ref 0.0–0.5)
Eosinophils Relative: 1 %
HCT: 43.9 % (ref 39.0–52.0)
Hemoglobin: 13.7 g/dL (ref 13.0–17.0)
Immature Granulocytes: 0 %
Lymphocytes Relative: 16 %
Lymphs Abs: 0.7 10*3/uL (ref 0.7–4.0)
MCH: 28.5 pg (ref 26.0–34.0)
MCHC: 31.2 g/dL (ref 30.0–36.0)
MCV: 91.5 fL (ref 80.0–100.0)
Monocytes Absolute: 0.5 10*3/uL (ref 0.1–1.0)
Monocytes Relative: 10 %
Neutro Abs: 3.3 10*3/uL (ref 1.7–7.7)
Neutrophils Relative %: 73 %
Platelets: 174 10*3/uL (ref 150–400)
RBC: 4.8 MIL/uL (ref 4.22–5.81)
RDW: 14 % (ref 11.5–15.5)
WBC: 4.6 10*3/uL (ref 4.0–10.5)
nRBC: 0 % (ref 0.0–0.2)

## 2019-07-16 LAB — COMPREHENSIVE METABOLIC PANEL
ALT: 25 U/L (ref 0–44)
AST: 31 U/L (ref 15–41)
Albumin: 3.3 g/dL — ABNORMAL LOW (ref 3.5–5.0)
Alkaline Phosphatase: 69 U/L (ref 38–126)
Anion gap: 5 (ref 5–15)
BUN: 18 mg/dL (ref 8–23)
CO2: 26 mmol/L (ref 22–32)
Calcium: 9.3 mg/dL (ref 8.9–10.3)
Chloride: 112 mmol/L — ABNORMAL HIGH (ref 98–111)
Creatinine, Ser: 1 mg/dL (ref 0.61–1.24)
GFR calc Af Amer: >60 mL/min (ref >60)
GFR calc non Af Amer: >60 mL/min (ref >60)
Glucose, Bld: 109 mg/dL — ABNORMAL HIGH (ref 70–99)
Potassium: 3.7 mmol/L (ref 3.5–5.1)
Sodium: 143 mmol/L (ref 135–145)
Total Bilirubin: 0.3 mg/dL (ref 0.3–1.2)
Total Protein: 7.4 g/dL (ref 6.5–8.1)

## 2019-07-16 LAB — CBG MONITORING, ED: Glucose-Capillary: 82 mg/dL (ref 70–99)

## 2019-07-16 LAB — VALPROIC ACID LEVEL: Valproic Acid Lvl: 13 ug/mL — ABNORMAL LOW (ref 50.0–100.0)

## 2019-07-16 MED ORDER — CIPROFLOXACIN HCL 250 MG PO TABS
500.0000 mg | ORAL_TABLET | Freq: Once | ORAL | Status: AC
  Administered 2019-07-16: 500 mg via ORAL
  Filled 2019-07-16: qty 2

## 2019-07-16 MED ORDER — CIPROFLOXACIN HCL 500 MG PO TABS: 500.0000 mg | ORAL_TABLET | Freq: Two times a day (BID) | ORAL | 0 refills | Status: AC

## 2019-07-16 NOTE — Discharge Instructions (Signed)
There is a urinary infection Take cipro twice a day for 7 days Next dose is 07/17/19 AM ER for worsening symtpoms

## 2019-07-16 NOTE — ED Triage Notes (Signed)
EMS reports [facility] staff told ems that pt normally is very talkative and friendly and usually receives geodon daily.  Reports for the past 2 weeks staff reports they found pt laying across bed or in floor, diaphoretic.  Reports hasn't been as talkative as usually.  Pt also chronically grinds his teeth.  Pt alert, pleasant, but disoriented to place.

## 2019-07-16 NOTE — ED Notes (Signed)
Henry Smith, ED Sec checked with EMS regarding pt transport for discharge- reports pt is on list, but will still be a while. Pt updated.
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
## 2019-07-16 NOTE — ED Notes (Signed)
Spoke with Pt's nurse, Shanon Brow LPN at [facility].  Per Shanon Brow, pt attempts to get up out of wheel chair by himself and falls to his knees frequently.  Reports staff saw pt on his knees beside his bed and was diaphoretic.   Reports bp on automated wrist machine was 224/124 but was 127/82 when ems arrived.  Reports pt usually in a wheelchair and only ambulates during therapy.  Reports pt is usually slow to answer and speech is slow.  Reports pt had another episode where he was found diaphoretic 2 weeks ago, bp 169/95 but was back to baseline within 5 min.  Per Shanon Brow, LPN, pt's mental status is not "that different" than usual

## 2019-07-19 LAB — URINE CULTURE: Culture: 40000 — AB

## 2019-07-21 ENCOUNTER — Telehealth: Payer: Self-pay | Admitting: *Deleted

## 2019-07-21 NOTE — Telephone Encounter (Signed)
Post ED Visit - Positive Culture Follow-up  Culture report reviewed by antimicrobial stewardship pharmacist: [place] Team []  Elenor Quinones, Pharm.D. []  Heide Guile, Pharm.D., BCPS AQ-ID []  Parks Neptune, Pharm.D., BCPS []  Alycia Rossetti, Pharm.D., BCPS []  [place], Pharm.D., BCPS, AAHIVP []  Legrand Como, Pharm.D., BCPS, AAHIVP []  Salome Arnt, PharmD, BCPS []  Johnnette Gourd, PharmD, BCPS []  Hughes Better, PharmD, BCPS []  Leeroy Cha, PharmD []  Laqueta Linden, PharmD, BCPS []  Albertina Parr, PharmD  [place] Team []  Leodis Sias, PharmD []  Lindell Spar, PharmD []  Royetta Asal, PharmD []  Graylin Shiver, Rph []  Rema Fendt) Glennon Mac, PharmD []  Arlyn Dunning, PharmD []  Netta Cedars, PharmD []  Dia Sitter, PharmD []  Leone Haven, PharmD []  Gretta Arab, PharmD []  Theodis Shove, PharmD []  Peggyann Juba, PharmD []  Reuel Boom, PharmD Eddie Candle, PharmD  Positive urine culture Treated with Ciprofloxacin HCL, organism sensitive to the same and no further patient follow-up is required at this time.  Harlon Flor [facility] 07/21/2019, 10:44 AM

## 2019-12-04 ENCOUNTER — Other Ambulatory Visit: Payer: Self-pay

## 2019-12-04 ENCOUNTER — Emergency Department (HOSPITAL_COMMUNITY): Payer: Medicare Other

## 2019-12-04 ENCOUNTER — Emergency Department (HOSPITAL_COMMUNITY)
Admission: EM | Admit: 2019-12-04 | Discharge: 2019-12-04 | Disposition: A | Discharge status: Home / Self Care | Payer: Medicare Other | Attending: Emergency Medicine | Admitting: Emergency Medicine

## 2019-12-04 ENCOUNTER — Encounter (HOSPITAL_COMMUNITY): Payer: Self-pay

## 2019-12-04 DIAGNOSIS — A523 Neurosyphilis, unspecified: Secondary | ICD-10-CM | POA: Insufficient documentation

## 2019-12-04 DIAGNOSIS — Z79899 Other long term (current) drug therapy: Secondary | ICD-10-CM | POA: Insufficient documentation

## 2019-12-04 DIAGNOSIS — F17210 Nicotine dependence, cigarettes, uncomplicated: Secondary | ICD-10-CM | POA: Diagnosis not present

## 2019-12-04 DIAGNOSIS — R531 Weakness: Secondary | ICD-10-CM | POA: Diagnosis present

## 2019-12-04 DIAGNOSIS — I10 Essential (primary) hypertension: Secondary | ICD-10-CM | POA: Diagnosis not present

## 2019-12-04 DIAGNOSIS — R001 Bradycardia, unspecified: Secondary | ICD-10-CM | POA: Insufficient documentation

## 2019-12-04 DIAGNOSIS — W19XXXA Unspecified fall, initial encounter: Secondary | ICD-10-CM

## 2019-12-04 DIAGNOSIS — F0280 Dementia in other diseases classified elsewhere without behavioral disturbance: Secondary | ICD-10-CM | POA: Insufficient documentation

## 2019-12-04 LAB — URINALYSIS, ROUTINE W REFLEX MICROSCOPIC
Bilirubin Urine: NEGATIVE
Glucose, UA: NEGATIVE mg/dL
Hgb urine dipstick: NEGATIVE
Ketones, ur: 5 mg/dL — AB
Leukocytes,Ua: NEGATIVE
Nitrite: NEGATIVE
Protein, ur: NEGATIVE mg/dL
Specific Gravity, Urine: 1.024 (ref 1.005–1.030)
pH: 6 (ref 5.0–8.0)

## 2019-12-04 LAB — COMPREHENSIVE METABOLIC PANEL
ALT: 28 U/L (ref 0–44)
AST: 29 U/L (ref 15–41)
Albumin: 3.3 g/dL — ABNORMAL LOW (ref 3.5–5.0)
Alkaline Phosphatase: 54 U/L (ref 38–126)
Anion gap: 7 (ref 5–15)
BUN: 17 mg/dL (ref 8–23)
CO2: 28 mmol/L (ref 22–32)
Calcium: 9.4 mg/dL (ref 8.9–10.3)
Chloride: 108 mmol/L (ref 98–111)
Creatinine, Ser: 0.92 mg/dL (ref 0.61–1.24)
GFR calc Af Amer: >60 mL/min (ref >60)
GFR calc non Af Amer: >60 mL/min (ref >60)
Glucose, Bld: 90 mg/dL (ref 70–99)
Potassium: 4 mmol/L (ref 3.5–5.1)
Sodium: 143 mmol/L (ref 135–145)
Total Bilirubin: 0.6 mg/dL (ref 0.3–1.2)
Total Protein: 6.6 g/dL (ref 6.5–8.1)

## 2019-12-04 LAB — CBC WITH DIFFERENTIAL/PLATELET
Abs Immature Granulocytes: 0.01 10*3/uL (ref 0.00–0.07)
Basophils Absolute: 0 10*3/uL (ref 0.0–0.1)
Basophils Relative: 0 %
Eosinophils Absolute: 0.1 10*3/uL (ref 0.0–0.5)
Eosinophils Relative: 2 %
HCT: 43.3 % (ref 39.0–52.0)
Hemoglobin: 13.5 g/dL (ref 13.0–17.0)
Immature Granulocytes: 0 %
Lymphocytes Relative: 40 %
Lymphs Abs: 1.1 10*3/uL (ref 0.7–4.0)
MCH: 28.2 pg (ref 26.0–34.0)
MCHC: 31.2 g/dL (ref 30.0–36.0)
MCV: 90.4 fL (ref 80.0–100.0)
Monocytes Absolute: 0.3 10*3/uL (ref 0.1–1.0)
Monocytes Relative: 9 %
Neutro Abs: 1.4 10*3/uL — ABNORMAL LOW (ref 1.7–7.7)
Neutrophils Relative %: 49 %
Platelets: 73 10*3/uL — ABNORMAL LOW (ref 150–400)
RBC: 4.79 MIL/uL (ref 4.22–5.81)
RDW: 14.8 % (ref 11.5–15.5)
WBC: 2.9 10*3/uL — ABNORMAL LOW (ref 4.0–10.5)
nRBC: 0 % (ref 0.0–0.2)

## 2019-12-04 LAB — VALPROIC ACID LEVEL: Valproic Acid Lvl: 55 ug/mL (ref 50.0–100.0)

## 2019-12-04 MED ORDER — CLONIDINE HCL 0.1 MG PO TABS
0.1000 mg | ORAL_TABLET | Freq: Once | ORAL | Status: AC
  Administered 2019-12-04: 0.1 mg via ORAL
  Filled 2019-12-04: qty 1

## 2019-12-04 NOTE — ED Triage Notes (Signed)
Pt here for a fall at 1330. Resident from [facility]. Pt dizzy and unsteady on feet. HTN with EMS 200/100. No use of blood thinners. History of CHF, schizophrenia, and AMS.

## 2019-12-04 NOTE — ED Notes (Signed)
Have given report to [facility] Nurse.

## 2019-12-04 NOTE — ED Notes (Signed)
EMS has been called for transportation
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
## 2019-12-04 NOTE — ED Provider Notes (Signed)
Medical screening examination/treatment/procedure(s) were performed by non-physician practitioner and as supervising physician I was immediately available for consultation/collaboration.    EKG Rhythm: Sinus bradycardia Rate 47 bpm PR: 182 ms QRS: 156 ms QTc: 442 ms ST segments: Nonspecific ST changes.   Virgel Manifold, MD 12/04/19 1800

## 2019-12-04 NOTE — Discharge Instructions (Addendum)
Your lab tests and and CT scans are negative tonight for the source of your increased weakness and there is no evidence of injury from your fall today.  Your blood pressure was elevated today during your visit.  I recommend not missing your blood pressure medicines but also taking the clonidine tablets (every 12 hours) if needed for elevated blood pressure control as prescribed by your MD.    Your pulse rate has also been low while here - this might be a side effect of your metoprolol and also may need to be adjusted. Please see your MD to discuss your blood pressure medications.

## 2019-12-04 NOTE — ED Notes (Signed)
ekg given to Dr. Laverta [place]

## 2019-12-04 NOTE — ED Notes (Signed)
Pt to xray

## 2020-01-01 ENCOUNTER — Encounter (HOSPITAL_COMMUNITY): Payer: Self-pay | Admitting: *Deleted

## 2020-01-01 ENCOUNTER — Emergency Department (HOSPITAL_COMMUNITY): Payer: Medicare Other

## 2020-01-01 ENCOUNTER — Inpatient Hospital Stay (HOSPITAL_COMMUNITY)
Admission: EM | Admit: 2020-01-01 | Discharge: 2020-01-06 | DRG: 871 | Disposition: A | Discharge status: Skilled Nursing Facility | Payer: Medicare Other | Source: Skilled Nursing Facility | Attending: Internal Medicine | Admitting: Internal Medicine

## 2020-01-01 ENCOUNTER — Other Ambulatory Visit: Payer: Self-pay

## 2020-01-01 DIAGNOSIS — A5217 General paresis: Secondary | ICD-10-CM | POA: Diagnosis present

## 2020-01-01 DIAGNOSIS — Z8619 Personal history of other infectious and parasitic diseases: Secondary | ICD-10-CM

## 2020-01-01 DIAGNOSIS — Z8546 Personal history of malignant neoplasm of prostate: Secondary | ICD-10-CM

## 2020-01-01 DIAGNOSIS — G9340 Encephalopathy, unspecified: Secondary | ICD-10-CM

## 2020-01-01 DIAGNOSIS — Z87440 Personal history of urinary (tract) infections: Secondary | ICD-10-CM

## 2020-01-01 DIAGNOSIS — F0280 Dementia in other diseases classified elsewhere without behavioral disturbance: Secondary | ICD-10-CM | POA: Diagnosis present

## 2020-01-01 DIAGNOSIS — T380X5A Adverse effect of glucocorticoids and synthetic analogues, initial encounter: Secondary | ICD-10-CM | POA: Diagnosis not present

## 2020-01-01 DIAGNOSIS — R4182 Altered mental status, unspecified: Secondary | ICD-10-CM | POA: Diagnosis present

## 2020-01-01 DIAGNOSIS — R0603 Acute respiratory distress: Secondary | ICD-10-CM

## 2020-01-01 DIAGNOSIS — Z9089 Acquired absence of other organs: Secondary | ICD-10-CM | POA: Diagnosis not present

## 2020-01-01 DIAGNOSIS — D696 Thrombocytopenia, unspecified: Secondary | ICD-10-CM | POA: Diagnosis present

## 2020-01-01 DIAGNOSIS — F17290 Nicotine dependence, other tobacco product, uncomplicated: Secondary | ICD-10-CM | POA: Diagnosis present

## 2020-01-01 DIAGNOSIS — U071 COVID-19: Secondary | ICD-10-CM

## 2020-01-01 DIAGNOSIS — Z791 Long term (current) use of non-steroidal anti-inflammatories (NSAID): Secondary | ICD-10-CM

## 2020-01-01 DIAGNOSIS — I119 Hypertensive heart disease without heart failure: Secondary | ICD-10-CM | POA: Diagnosis present

## 2020-01-01 DIAGNOSIS — D72818 Other decreased white blood cell count: Secondary | ICD-10-CM | POA: Diagnosis present

## 2020-01-01 DIAGNOSIS — R1310 Dysphagia, unspecified: Secondary | ICD-10-CM | POA: Diagnosis present

## 2020-01-01 DIAGNOSIS — A4189 Other specified sepsis: Principal | ICD-10-CM | POA: Diagnosis present

## 2020-01-01 DIAGNOSIS — F17210 Nicotine dependence, cigarettes, uncomplicated: Secondary | ICD-10-CM | POA: Diagnosis present

## 2020-01-01 DIAGNOSIS — G9341 Metabolic encephalopathy: Secondary | ICD-10-CM | POA: Diagnosis present

## 2020-01-01 DIAGNOSIS — F250 Schizoaffective disorder, bipolar type: Secondary | ICD-10-CM | POA: Diagnosis present

## 2020-01-01 DIAGNOSIS — J9601 Acute respiratory failure with hypoxia: Secondary | ICD-10-CM | POA: Diagnosis present

## 2020-01-01 DIAGNOSIS — I160 Hypertensive urgency: Secondary | ICD-10-CM | POA: Diagnosis present

## 2020-01-01 DIAGNOSIS — A419 Sepsis, unspecified organism: Secondary | ICD-10-CM

## 2020-01-01 DIAGNOSIS — Z79899 Other long term (current) drug therapy: Secondary | ICD-10-CM

## 2020-01-01 DIAGNOSIS — I10 Essential (primary) hypertension: Secondary | ICD-10-CM

## 2020-01-01 DIAGNOSIS — J1282 Pneumonia due to coronavirus disease 2019: Secondary | ICD-10-CM | POA: Diagnosis present

## 2020-01-01 DIAGNOSIS — R6520 Severe sepsis without septic shock: Secondary | ICD-10-CM | POA: Diagnosis present

## 2020-01-01 DIAGNOSIS — I452 Bifascicular block: Secondary | ICD-10-CM | POA: Diagnosis present

## 2020-01-01 DIAGNOSIS — J9600 Acute respiratory failure, unspecified whether with hypoxia or hypercapnia: Secondary | ICD-10-CM | POA: Diagnosis not present

## 2020-01-01 DIAGNOSIS — Z880 Allergy status to penicillin: Secondary | ICD-10-CM

## 2020-01-01 DIAGNOSIS — K0889 Other specified disorders of teeth and supporting structures: Secondary | ICD-10-CM | POA: Diagnosis present

## 2020-01-01 DIAGNOSIS — Z9181 History of falling: Secondary | ICD-10-CM

## 2020-01-01 DIAGNOSIS — Z79890 Hormone replacement therapy (postmenopausal): Secondary | ICD-10-CM

## 2020-01-01 LAB — URINALYSIS, ROUTINE W REFLEX MICROSCOPIC
Bacteria, UA: NONE SEEN
Bilirubin Urine: NEGATIVE
Glucose, UA: NEGATIVE mg/dL
Ketones, ur: 20 mg/dL — AB
Leukocytes,Ua: NEGATIVE
Nitrite: NEGATIVE
Protein, ur: NEGATIVE mg/dL
RBC / HPF: >50 RBC/hpf — ABNORMAL HIGH (ref 0–5)
Specific Gravity, Urine: 1.019 (ref 1.005–1.030)
pH: 6 (ref 5.0–8.0)

## 2020-01-01 LAB — COMPREHENSIVE METABOLIC PANEL
ALT: 47 U/L — ABNORMAL HIGH (ref 0–44)
AST: 57 U/L — ABNORMAL HIGH (ref 15–41)
Albumin: 3.6 g/dL (ref 3.5–5.0)
Alkaline Phosphatase: 56 U/L (ref 38–126)
Anion gap: 11 (ref 5–15)
BUN: 19 mg/dL (ref 8–23)
CO2: 25 mmol/L (ref 22–32)
Calcium: 9.6 mg/dL (ref 8.9–10.3)
Chloride: 107 mmol/L (ref 98–111)
Creatinine, Ser: 1.1 mg/dL (ref 0.61–1.24)
GFR calc Af Amer: >60 mL/min (ref >60)
GFR calc non Af Amer: >60 mL/min (ref >60)
Glucose, Bld: 124 mg/dL — ABNORMAL HIGH (ref 70–99)
Potassium: 4.1 mmol/L (ref 3.5–5.1)
Sodium: 143 mmol/L (ref 135–145)
Total Bilirubin: 0.8 mg/dL (ref 0.3–1.2)
Total Protein: 7.9 g/dL (ref 6.5–8.1)

## 2020-01-01 LAB — CBC WITH DIFFERENTIAL/PLATELET
Abs Immature Granulocytes: 0 10*3/uL (ref 0.00–0.07)
Basophils Absolute: 0 10*3/uL (ref 0.0–0.1)
Basophils Relative: 0 %
Eosinophils Absolute: 0 10*3/uL (ref 0.0–0.5)
Eosinophils Relative: 0 %
HCT: 51.7 % (ref 39.0–52.0)
Hemoglobin: 16.1 g/dL (ref 13.0–17.0)
Immature Granulocytes: 0 %
Lymphocytes Relative: 25 %
Lymphs Abs: 0.6 10*3/uL — ABNORMAL LOW (ref 0.7–4.0)
MCH: 27.8 pg (ref 26.0–34.0)
MCHC: 31.1 g/dL (ref 30.0–36.0)
MCV: 89.3 fL (ref 80.0–100.0)
Monocytes Absolute: 0.2 10*3/uL (ref 0.1–1.0)
Monocytes Relative: 10 %
Neutro Abs: 1.6 10*3/uL — ABNORMAL LOW (ref 1.7–7.7)
Neutrophils Relative %: 65 %
Platelets: 56 10*3/uL — ABNORMAL LOW (ref 150–400)
RBC: 5.79 MIL/uL (ref 4.22–5.81)
RDW: 15.1 % (ref 11.5–15.5)
WBC: 2.5 10*3/uL — ABNORMAL LOW (ref 4.0–10.5)
nRBC: 0 % (ref 0.0–0.2)

## 2020-01-01 LAB — TROPONIN I (HIGH SENSITIVITY): Troponin I (High Sensitivity): 8 ng/L (ref <18)

## 2020-01-01 LAB — RESPIRATORY PANEL BY RT PCR (FLU A&B, COVID)
Influenza A by PCR: NEGATIVE
Influenza B by PCR: NEGATIVE
SARS Coronavirus 2 by RT PCR: POSITIVE — AB

## 2020-01-01 LAB — VALPROIC ACID LEVEL: Valproic Acid Lvl: 58 ug/mL (ref 50.0–100.0)

## 2020-01-01 LAB — PROCALCITONIN: Procalcitonin: <0.1 ng/mL

## 2020-01-01 LAB — GLUCOSE, CAPILLARY: Glucose-Capillary: 155 mg/dL — ABNORMAL HIGH (ref 70–99)

## 2020-01-01 LAB — ABO/RH: ABO/RH(D): B POS

## 2020-01-01 LAB — MRSA PCR SCREENING: MRSA by PCR: NEGATIVE

## 2020-01-01 LAB — APTT: aPTT: 28 seconds (ref 24–36)

## 2020-01-01 LAB — LACTIC ACID, PLASMA: Lactic Acid, Venous: 2.9 mmol/L (ref 0.5–1.9)

## 2020-01-01 LAB — PROTIME-INR
INR: 1.1 (ref 0.8–1.2)
Prothrombin Time: 13.9 seconds (ref 11.4–15.2)

## 2020-01-01 LAB — LACTATE DEHYDROGENASE: LDH: 173 U/L (ref 98–192)

## 2020-01-01 LAB — C-REACTIVE PROTEIN: CRP: 2.8 mg/dL — ABNORMAL HIGH (ref <1.0)

## 2020-01-01 LAB — D-DIMER, QUANTITATIVE: D-Dimer, Quant: 5.14 ug/mL-FEU — ABNORMAL HIGH (ref 0.00–0.50)

## 2020-01-01 LAB — FERRITIN: Ferritin: 225 ng/mL (ref 24–336)

## 2020-01-01 MED ORDER — ACETAMINOPHEN 325 MG PO TABS: 650.0000 mg | ORAL_TABLET | Freq: Four times a day (QID) | ORAL | Status: DC | PRN

## 2020-01-01 MED ORDER — LATANOPROST 0.005 % OP SOLN
1.0000 [drp] | Freq: Every day | OPHTHALMIC | Status: DC
  Administered 2020-01-02 – 2020-01-05 (×5): 1 [drp] via OPHTHALMIC
  Filled 2020-01-01 (×2): qty 2.5

## 2020-01-01 MED ORDER — SODIUM CHLORIDE 0.9 % IV SOLN
200.0000 mg | Freq: Once | INTRAVENOUS | Status: AC
  Administered 2020-01-01: 200 mg via INTRAVENOUS
  Filled 2020-01-01: qty 40

## 2020-01-01 MED ORDER — ONDANSETRON HCL 4 MG PO TABS: 4.0000 mg | ORAL_TABLET | Freq: Four times a day (QID) | ORAL | Status: DC | PRN

## 2020-01-01 MED ORDER — ONDANSETRON HCL 4 MG/2ML IJ SOLN: 4.0000 mg | Freq: Four times a day (QID) | INTRAMUSCULAR | Status: DC | PRN

## 2020-01-01 MED ORDER — VALPROATE SODIUM 500 MG/5ML IV SOLN
1250.0000 mg | Freq: Once | INTRAVENOUS | Status: AC
  Administered 2020-01-02: 1250 mg via INTRAVENOUS
  Filled 2020-01-01: qty 12.5

## 2020-01-01 MED ORDER — METRONIDAZOLE IN NACL 5-0.79 MG/ML-% IV SOLN
500.0000 mg | Freq: Three times a day (TID) | INTRAVENOUS | Status: DC
  Administered 2020-01-02: 500 mg via INTRAVENOUS
  Filled 2020-01-01: qty 100

## 2020-01-01 MED ORDER — VANCOMYCIN HCL IN DEXTROSE 1-5 GM/200ML-% IV SOLN
1000.0000 mg | Freq: Once | INTRAVENOUS | Status: AC
  Administered 2020-01-01: 1000 mg via INTRAVENOUS
  Filled 2020-01-01: qty 200

## 2020-01-01 MED ORDER — PAROXETINE HCL 20 MG PO TABS
20.0000 mg | ORAL_TABLET | Freq: Every day | ORAL | Status: DC
  Administered 2020-01-02 – 2020-01-06 (×5): 20 mg via ORAL
  Filled 2020-01-01 (×5): qty 1

## 2020-01-01 MED ORDER — SODIUM CHLORIDE 0.9 % IV BOLUS
1000.0000 mL | Freq: Once | INTRAVENOUS | Status: AC
  Administered 2020-01-01: 1000 mL via INTRAVENOUS

## 2020-01-01 MED ORDER — HALOPERIDOL 5 MG PO TABS
5.0000 mg | ORAL_TABLET | Freq: Two times a day (BID) | ORAL | Status: DC
  Administered 2020-01-02 – 2020-01-06 (×9): 5 mg via ORAL
  Filled 2020-01-01 (×9): qty 1

## 2020-01-01 MED ORDER — DIVALPROEX SODIUM 125 MG PO CSDR
1250.0000 mg | DELAYED_RELEASE_CAPSULE | Freq: Every day | ORAL | Status: DC
  Administered 2020-01-02: 1250 mg via ORAL
  Administered 2020-01-03: 125 mg via ORAL
  Administered 2020-01-04 – 2020-01-05 (×2): 1250 mg via ORAL
  Filled 2020-01-01 (×4): qty 10

## 2020-01-01 MED ORDER — SODIUM CHLORIDE 0.9 % IV SOLN
2.0000 g | Freq: Three times a day (TID) | INTRAVENOUS | Status: DC
  Administered 2020-01-02: 2 g via INTRAVENOUS
  Filled 2020-01-01: qty 2

## 2020-01-01 MED ORDER — SODIUM CHLORIDE 0.9 % IV SOLN
100.0000 mg | Freq: Every day | INTRAVENOUS | Status: AC
  Administered 2020-01-02 – 2020-01-05 (×4): 100 mg via INTRAVENOUS
  Filled 2020-01-01 (×3): qty 100
  Filled 2020-01-01: qty 20

## 2020-01-01 MED ORDER — NICARDIPINE HCL IN NACL 20-0.86 MG/200ML-% IV SOLN
3.0000 mg/h | INTRAVENOUS | Status: DC
  Administered 2020-01-01 (×2): 5 mg/h via INTRAVENOUS
  Filled 2020-01-01: qty 200

## 2020-01-01 MED ORDER — VANCOMYCIN HCL 1500 MG/300ML IV SOLN: 1500.0000 mg | INTRAVENOUS | Status: DC

## 2020-01-01 MED ORDER — SODIUM CHLORIDE 0.9 % IV SOLN
2.0000 g | Freq: Once | INTRAVENOUS | Status: AC
  Administered 2020-01-01: 2 g via INTRAVENOUS
  Filled 2020-01-01: qty 2

## 2020-01-01 MED ORDER — SODIUM CHLORIDE 0.9 % IV SOLN: INTRAVENOUS | Status: DC

## 2020-01-01 MED ORDER — DEXAMETHASONE SODIUM PHOSPHATE 10 MG/ML IJ SOLN
6.0000 mg | INTRAMUSCULAR | Status: DC
  Administered 2020-01-01 – 2020-01-05 (×5): 6 mg via INTRAVENOUS
  Filled 2020-01-01 (×6): qty 1

## 2020-01-01 MED ORDER — DICLOFENAC SODIUM 1 % TD GEL
4.0000 g | Freq: Four times a day (QID) | TRANSDERMAL | Status: DC
  Administered 2020-01-02 – 2020-01-06 (×14): 4 g via TOPICAL
  Filled 2020-01-01: qty 100

## 2020-01-01 MED ORDER — GABAPENTIN 300 MG PO CAPS
300.0000 mg | ORAL_CAPSULE | Freq: Three times a day (TID) | ORAL | Status: DC
  Administered 2020-01-02 – 2020-01-06 (×13): 300 mg via ORAL
  Filled 2020-01-01 (×15): qty 1

## 2020-01-01 MED ORDER — DIVALPROEX SODIUM 250 MG PO DR TAB: 250.0000 mg | DELAYED_RELEASE_TABLET | Freq: Every day | ORAL | Status: DC

## 2020-01-01 MED ORDER — ACETAMINOPHEN 650 MG RE SUPP: 650.0000 mg | Freq: Four times a day (QID) | RECTAL | Status: DC | PRN

## 2020-01-01 MED ORDER — AMANTADINE HCL 100 MG PO CAPS
100.0000 mg | ORAL_CAPSULE | Freq: Two times a day (BID) | ORAL | Status: DC
  Administered 2020-01-02 – 2020-01-06 (×9): 100 mg via ORAL
  Filled 2020-01-01 (×16): qty 1

## 2020-01-01 MED ORDER — SODIUM CHLORIDE 0.9% FLUSH
3.0000 mL | Freq: Two times a day (BID) | INTRAVENOUS | Status: DC
  Administered 2020-01-02 (×3): 3 mL via INTRAVENOUS
  Administered 2020-01-03: 10 mL via INTRAVENOUS
  Administered 2020-01-03 – 2020-01-05 (×5): 3 mL via INTRAVENOUS

## 2020-01-01 MED ORDER — ACETAMINOPHEN 650 MG RE SUPP
650.0000 mg | Freq: Once | RECTAL | Status: AC
  Administered 2020-01-01: 650 mg via RECTAL
  Filled 2020-01-01: qty 1

## 2020-01-01 MED ORDER — LOSARTAN POTASSIUM 50 MG PO TABS
75.0000 mg | ORAL_TABLET | Freq: Every day | ORAL | Status: DC
  Administered 2020-01-02 – 2020-01-06 (×5): 75 mg via ORAL
  Filled 2020-01-01 (×2): qty 3
  Filled 2020-01-01: qty 2
  Filled 2020-01-01 (×2): qty 3

## 2020-01-01 MED ORDER — METOPROLOL TARTRATE 25 MG PO TABS
12.5000 mg | ORAL_TABLET | Freq: Two times a day (BID) | ORAL | Status: DC
  Administered 2020-01-02 – 2020-01-04 (×6): 12.5 mg via ORAL
  Filled 2020-01-01 (×8): qty 1

## 2020-01-01 MED ORDER — DIVALPROEX SODIUM 125 MG PO CSDR
500.0000 mg | DELAYED_RELEASE_CAPSULE | Freq: Two times a day (BID) | ORAL | Status: DC
  Administered 2020-01-02 – 2020-01-05 (×8): 500 mg via ORAL
  Filled 2020-01-01 (×8): qty 4
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
## 2020-01-01 NOTE — ED Notes (Signed)
Awaiting blood cultures to hang abx.

## 2020-01-01 NOTE — Progress Notes (Signed)
Dr. Myna Hidalgo paged to make aware that patients temperature is 102.9 and no tylenol suppository is ordered. Also made aware of HR 140s and BP with Cardene gtt at 13mcg. Waiting for orders/call back. Will continue to monitor pt.

## 2020-01-01 NOTE — ED Triage Notes (Signed)
Staff at [facility] called and advised that pt was refusing to take his medications tonight, was hypertensive and running a fever of 104.0. pt arrived to er by ems with jaw clenched, unresponsive, pt had food in his mouth (ems reports that staff had been trying to feed pt prior to their arrival). Dr Sabra Heck at bedside,

## 2020-01-01 NOTE — Progress Notes (Signed)
Pharmacy Antibiotic Note  Henry Smith is a 79 y.o. male admitted on 01/01/2020 with pneumonia.  Pharmacy has been consulted for vanc/cefepime dosing.  Henry Smith presented to the ED with AMS from the SNF. Vanc and cefepime have been ordered to r/o PNA along with COVID.   Scr 1.1 Wbc 2.5  Plan: Vanc 2g x1 in ED then 1.5g IV q24>>AUC 422, scr 1.1 Cefepime 2g IV q8 MRSA PCR F/u with level if needed  Weight: 207 lb 0.2 oz (93.9 kg)  Temp (24hrs), Avg:103.6 F (39.8 C), Min:103.1 F (39.5 C), Max:104.1 F (40.1 C)  Recent Labs  Lab 01/01/20 2006  WBC 2.5*  CREATININE 1.10    CrCl cannot be calculated (Unknown ideal weight.).    Allergies  Allergen Reactions  . Penicillins Swelling    Has patient had a PCN reaction causing immediate rash, facial/tongue/throat swelling, SOB or lightheadedness with hypotension: Yes Has patient had a PCN reaction causing severe rash involving mucus membranes or skin necrosis: No Has patient had a PCN reaction that required hospitalization: No Has patient had a PCN reaction occurring within the last 10 years: No If all of the above answers are "NO", then may proceed with Cephalosporin use.   Marland Kitchen Penicillin G     Antimicrobials this admission: 1/18 vanc>> 1/18 cefepime>>  Dose adjustments this admission:  Microbiology results: 1/18 blood>> 1/18 urine>>  Onnie Boer, PharmD, [place], AAHIVP, CPP Infectious Disease Pharmacist 01/01/2020 9:32 PM

## 2020-01-01 NOTE — ED Notes (Signed)
Date and time results received: 01/01/20 2138(use smartphrase ".now" to insert current time)  Test:Lactic/Covid Critical Value: Lactic 2.9/Covid Positive  Name of Provider Notified: Dr Sabra Heck  Orders Received? Or Actions Taken?:NA

## 2020-01-01 NOTE — ED Notes (Signed)
Patient transported to CT 
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
## 2020-01-02 ENCOUNTER — Other Ambulatory Visit: Payer: Self-pay

## 2020-01-02 ENCOUNTER — Inpatient Hospital Stay (HOSPITAL_COMMUNITY): Payer: Medicare Other

## 2020-01-02 DIAGNOSIS — U071 COVID-19: Secondary | ICD-10-CM | POA: Diagnosis present

## 2020-01-02 DIAGNOSIS — J9601 Acute respiratory failure with hypoxia: Secondary | ICD-10-CM

## 2020-01-02 LAB — CBC WITH DIFFERENTIAL/PLATELET
Abs Immature Granulocytes: 0.01 10*3/uL (ref 0.00–0.07)
Basophils Absolute: 0 10*3/uL (ref 0.0–0.1)
Basophils Relative: 0 %
Eosinophils Absolute: 0 10*3/uL (ref 0.0–0.5)
Eosinophils Relative: 0 %
HCT: 46.6 % (ref 39.0–52.0)
Hemoglobin: 14.7 g/dL (ref 13.0–17.0)
Immature Granulocytes: 0 %
Lymphocytes Relative: 17 %
Lymphs Abs: 0.5 10*3/uL — ABNORMAL LOW (ref 0.7–4.0)
MCH: 28 pg (ref 26.0–34.0)
MCHC: 31.5 g/dL (ref 30.0–36.0)
MCV: 88.8 fL (ref 80.0–100.0)
Monocytes Absolute: 0.3 10*3/uL (ref 0.1–1.0)
Monocytes Relative: 11 %
Neutro Abs: 2.2 10*3/uL (ref 1.7–7.7)
Neutrophils Relative %: 72 %
Platelets: 53 10*3/uL — ABNORMAL LOW (ref 150–400)
RBC: 5.25 MIL/uL (ref 4.22–5.81)
RDW: 15.1 % (ref 11.5–15.5)
WBC Morphology: INCREASED
WBC: 3.1 10*3/uL — ABNORMAL LOW (ref 4.0–10.5)
nRBC: 0 % (ref 0.0–0.2)

## 2020-01-02 LAB — TROPONIN I (HIGH SENSITIVITY): Troponin I (High Sensitivity): 12 ng/L (ref <18)

## 2020-01-02 LAB — LACTIC ACID, PLASMA
Lactic Acid, Venous: 1.9 mmol/L (ref 0.5–1.9)
Lactic Acid, Venous: 2.2 mmol/L (ref 0.5–1.9)

## 2020-01-02 LAB — COMPREHENSIVE METABOLIC PANEL
ALT: 39 U/L (ref 0–44)
AST: 40 U/L (ref 15–41)
Albumin: 3 g/dL — ABNORMAL LOW (ref 3.5–5.0)
Alkaline Phosphatase: 47 U/L (ref 38–126)
Anion gap: 5 (ref 5–15)
BUN: 15 mg/dL (ref 8–23)
CO2: 23 mmol/L (ref 22–32)
Calcium: 8.4 mg/dL — ABNORMAL LOW (ref 8.9–10.3)
Chloride: 113 mmol/L — ABNORMAL HIGH (ref 98–111)
Creatinine, Ser: 0.7 mg/dL (ref 0.61–1.24)
GFR calc Af Amer: >60 mL/min (ref >60)
GFR calc non Af Amer: >60 mL/min (ref >60)
Glucose, Bld: 147 mg/dL — ABNORMAL HIGH (ref 70–99)
Potassium: 3.6 mmol/L (ref 3.5–5.1)
Sodium: 141 mmol/L (ref 135–145)
Total Bilirubin: 0.8 mg/dL (ref 0.3–1.2)
Total Protein: 6.6 g/dL (ref 6.5–8.1)

## 2020-01-02 LAB — D-DIMER, QUANTITATIVE: D-Dimer, Quant: 5.71 ug/mL-FEU — ABNORMAL HIGH (ref 0.00–0.50)

## 2020-01-02 LAB — GLUCOSE, CAPILLARY
Glucose-Capillary: 97 mg/dL (ref 70–99)
Glucose-Capillary: 92 mg/dL (ref 70–99)

## 2020-01-02 LAB — SAVE SMEAR(SSMR), FOR PROVIDER SLIDE REVIEW

## 2020-01-02 LAB — FERRITIN: Ferritin: 195 ng/mL (ref 24–336)

## 2020-01-02 LAB — AMMONIA: Ammonia: 35 umol/L (ref 9–35)

## 2020-01-02 LAB — MAGNESIUM: Magnesium: 1.8 mg/dL (ref 1.7–2.4)

## 2020-01-02 LAB — C-REACTIVE PROTEIN: CRP: 5 mg/dL — ABNORMAL HIGH (ref <1.0)

## 2020-01-02 MED ORDER — LABETALOL HCL 5 MG/ML IV SOLN: 10.0000 mg | INTRAVENOUS | Status: DC | PRN

## 2020-01-02 MED ORDER — INSULIN ASPART 100 UNIT/ML ~~LOC~~ SOLN: 0.0000 [IU] | Freq: Every day | SUBCUTANEOUS | Status: DC

## 2020-01-02 MED ORDER — ORAL CARE MOUTH RINSE
15.0000 mL | Freq: Two times a day (BID) | OROMUCOSAL | Status: DC
  Administered 2020-01-02 – 2020-01-06 (×10): 15 mL via OROMUCOSAL

## 2020-01-02 MED ORDER — ACETAMINOPHEN 650 MG RE SUPP
650.0000 mg | RECTAL | Status: DC | PRN
  Administered 2020-01-02: 650 mg via RECTAL
  Filled 2020-01-02: qty 1

## 2020-01-02 MED ORDER — SODIUM CHLORIDE 0.9 % IV SOLN
500.0000 mg | INTRAVENOUS | Status: DC
  Administered 2020-01-02 – 2020-01-06 (×5): 500 mg via INTRAVENOUS
  Filled 2020-01-02 (×5): qty 500

## 2020-01-02 MED ORDER — DICLOFENAC SODIUM 1 % EX GEL
CUTANEOUS | Status: AC
  Filled 2020-01-02: qty 100

## 2020-01-02 MED ORDER — CHLORHEXIDINE GLUCONATE CLOTH 2 % EX PADS
6.0000 | MEDICATED_PAD | Freq: Every day | CUTANEOUS | Status: DC
  Administered 2020-01-02 – 2020-01-06 (×5): 6 via TOPICAL

## 2020-01-02 MED ORDER — SODIUM CHLORIDE 0.9 % IV SOLN
2.0000 g | INTRAVENOUS | Status: DC
  Administered 2020-01-02 – 2020-01-06 (×5): 2 g via INTRAVENOUS
  Filled 2020-01-02 (×5): qty 20

## 2020-01-02 MED ORDER — ORAL CARE MOUTH RINSE: 15.0000 mL | Freq: Two times a day (BID) | OROMUCOSAL | Status: DC

## 2020-01-02 MED ORDER — LABETALOL HCL 5 MG/ML IV SOLN
10.0000 mg | Freq: Once | INTRAVENOUS | Status: AC
  Administered 2020-01-02: 10 mg via INTRAVENOUS
  Filled 2020-01-02: qty 4

## 2020-01-02 MED ORDER — VALPROATE SODIUM 500 MG/5ML IV SOLN
INTRAVENOUS | Status: AC
  Filled 2020-01-02: qty 15

## 2020-01-02 MED ORDER — DEXTROSE-NACL 5-0.45 % IV SOLN: INTRAVENOUS | Status: DC

## 2020-01-02 MED ORDER — INSULIN ASPART 100 UNIT/ML ~~LOC~~ SOLN
0.0000 [IU] | Freq: Three times a day (TID) | SUBCUTANEOUS | Status: DC
  Administered 2020-01-04 – 2020-01-05 (×3): 1 [IU] via SUBCUTANEOUS
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
## 2020-01-02 NOTE — Progress Notes (Signed)
Patient Demographics:    Henry Smith, is a 79 y.o. male, DOB - 04/11/1941, [id number]  Admit date - 01/01/2020   Admitting Physician Vianne Bulls, MD  Outpatient Primary MD for the patient is Caprice Renshaw, MD  LOS - 1   Chief Complaint  Patient presents with  . 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 male with medical history significant for dementia associated with neurosyphilis, schizoaffective disorder, chronic thrombocytopenia and recurrent leukopenia, hypertension, and history of prostate cancer found to have COVID-19 respiratory infection  A/p 1)Acute hypoxic respiratory infection secondary to COVID-19--continue remdesivir and Decadron started on 01/01/2020 -Follow inflammatory markers -Stop IV Flagyl, stop vancomycin stop cefepime, okay to treat empirically with Rocephin and azithromycin for possible concomitant CAP  2) acute metabolic encephalopathy secondary to #1 above superimposed on underlying dementia -Serum ammonia is not elevated  3)Leukopenia and thrombocytopenia--- appears chronic, may worsen with COVID-19 infection -No bleeding noted  4)Dysphagia concerns--speech eval appreciated, recommends n.p.o. except for meds, further evaluation pending -Gentle IV fluids as ordered  5)HTN-had hypotension on nicardipine drip, currently off nicardipine drip, okay to continue p.o. losartan 75 mg daily, metoprolol 12.5 mg twice daily, may use  as needed IV labetalol   6)Dementia--- continue Depakote 500 mg twice daily, Paxil and haloperidol  Disposition/Need for in-Hospital Stay- patient unable to be discharged at this time due to --COVID-19 respiratory infection requiring IV remdesivir and Decadron-  Code Status : Full  Family Communication:   NA (patient is alert, awake and coherent)   Disposition Plan  : SNF rehab when clinically improved  Consults  : Speech pathologist  DVT Prophylaxis  :   - SCDs (low Platelets)  Lab Results  Component Value Date   PLT 53 (L) 01/02/2020    Inpatient Medications  Scheduled Meds: . amantadine  100 mg Oral BID  . Chlorhexidine Gluconate Cloth  6 each Topical Daily  . dexamethasone (DECADRON) injection  6 mg Intravenous Q24H  . diclofenac sodium  4 g Topical QID  . divalproex  1,250 mg Oral QHS  . divalproex  500 mg Oral BID  . gabapentin  300 mg Oral TID  . haloperidol  5 mg Oral BID  . latanoprost  1 drop Both Eyes QHS  . losartan  75 mg Oral Daily  . mouth rinse  15 mL Mouth Rinse BID  . metoprolol tartrate  12.5 mg Oral BID  . PARoxetine  20 mg Oral Daily  . sodium chloride flush  3 mL Intravenous Q12H   Continuous Infusions: . azithromycin Stopped (01/02/20 1000)  . cefTRIAXone (ROCEPHIN)  IV Stopped (01/02/20 [id number])  . remdesivir 100 mg in NS 100 mL 100 mg (01/02/20 1539)   PRN Meds:.acetaminophen, acetaminophen **OR** [DISCONTINUED] acetaminophen, labetalol, ondansetron **  OR** ondansetron (ZOFRAN) IV    Anti-infectives (From admission, onward)   Start     Dose/Rate Route Frequency Ordered Stop   01/02/20 2200  vancomycin (VANCOREADY) IVPB 1500 mg/300 mL  Status:  Discontinued     1,500 mg 150 mL/hr over 120 Minutes Intravenous Every 24 hours 01/01/20 2134 01/02/20 0817   01/02/20 1600  remdesivir 100 mg in sodium chloride 0.9 % 100 mL IVPB     100 mg 200 mL/hr over 30 Minutes Intravenous Daily 01/01/20 2202 01/06/20 0959   01/02/20 0830  cefTRIAXone  (ROCEPHIN) 2 g in sodium chloride 0.9 % 100 mL IVPB     2 g 200 mL/hr over 30 Minutes Intravenous Every 24 hours 01/02/20 0817     01/02/20 0830  azithromycin (ZITHROMAX) 500 mg in sodium chloride 0.9 % 250 mL IVPB     500 mg 250 mL/hr over 60 Minutes Intravenous Every 24 hours 01/02/20 0817     01/02/20 0400  ceFEPIme (MAXIPIME) 2 g in sodium chloride 0.9 % 100 mL IVPB  Status:  Discontinued     2 g 200 mL/hr over 30 Minutes Intravenous Every 8 hours 01/01/20 2134 01/02/20 0817   01/02/20 0015  metroNIDAZOLE (FLAGYL) IVPB 500 mg  Status:  Discontinued     500 mg 100 mL/hr over 60 Minutes Intravenous Every 8 hours 01/01/20 2252 01/02/20 0816   01/01/20 2215  remdesivir 200 mg in sodium chloride 0.9% 250 mL IVPB     200 mg 580 mL/hr over 30 Minutes Intravenous Once 01/01/20 2202 01/02/20 0107   01/01/20 2115  vancomycin (VANCOCIN) IVPB 1000 mg/200 mL premix     1,000 mg 200 mL/hr over 60 Minutes Intravenous  Once 01/01/20 2106 01/02/20 0001   01/01/20 2030  vancomycin (VANCOCIN) IVPB 1000 mg/200 mL premix     1,000 mg 200 mL/hr over 60 Minutes Intravenous  Once 01/01/20 2016 01/01/20 2234   01/01/20 2030  ceFEPIme (MAXIPIME) 2 g in sodium chloride 0.9 % 100 mL IVPB     2 g 200 mL/hr over 30 Minutes Intravenous  Once 01/01/20 2016 01/01/20 2139        Objective:   Vitals:   01/02/20 1600 01/02/20 1646 01/02/20 1745 01/02/20 1800  BP: (!) 155/95  (!) 159/94 (!) 173/103  Pulse: 80 91 90 94  Resp: 20 18 19  (!) 21  Temp: 99.3 F (37.4 C) 98.2 F (36.8 C) 99.5 F (37.5 C) 99.3 F (37.4 C)  TempSrc:      SpO2: 100% 93% 94% 97%  Weight:      Height:        Wt Readings from Last 3 Encounters:  01/01/20 93.9 kg  06/12/19 93.9 kg  07/31/18 93.9 kg     Intake/Output Summary (Last 24 hours) at 01/02/2020 1839 Last data filed at 01/02/2020 1700 Gross per 24 hour  Intake 3780.94 ml  Output 1290 ml  Net 2490.94 ml    Physical Exam  Gen:- Awake Alert, resting comfortably  HEENT:- [place].AT, No sclera icterus Neck-Supple Neck,No JVD,.  Lungs-diminished in bases, no wheezing  CV- S1, S2 normal, regular  Abd-  +ve B.Sounds, Abd Soft, No tenderness,    Extremity/Skin:- No  edema, pedal pulses present  Psych-affect is flat, disoriented, cognitive and memory deficits at baseline,  Neuro-generalized weakness, no new focal deficits, no tremors   Data Review:   Micro Results Recent Results (from the past 240 hour(s))  Respiratory Panel by RT PCR (Flu A&B, Covid) - Nasopharyngeal  Swab     Status: Abnormal   Collection Time: 01/01/20  8:39 PM   Specimen: Nasopharyngeal Swab  Result Value Ref Range Status   SARS Coronavirus 2 by RT PCR POSITIVE (A) NEGATIVE Final    Comment: RESULT CALLED TO, READ BACK BY AND VERIFIED WITH: H CRAWFORD,RN @2134  01/01/20 MKELLY (NOTE) SARS-CoV-2 target nucleic acids are DETECTED. SARS-CoV-2 RNA is generally detectable in upper respiratory specimens  during the acute phase of infection. Positive results are indicative of the presence of the identified virus, but do not rule out bacterial infection or co-infection with other pathogens not detected by the test. Clinical correlation with patient history and other diagnostic information is necessary to determine patient infection status. The expected result is Negative. Fact Sheet for Patients:  PinkCheek.be Fact Sheet for Healthcare Providers: GravelBags.it This test is not yet approved or cleared by the [place] FDA and  has been authorized for detection and/or diagnosis of SARS-CoV-2 by FDA under an Emergency Use Authorization (EUA).  This EUA will remain in effect (meaning this test can be used) f or the duration of  the COVID-19 declaration under Section 564(b)(1) of the Act, 21 U.S.C. section 360bbb-3(b)(1), unless the authorization is terminated or revoked sooner.    Influenza A by PCR NEGATIVE NEGATIVE Final    Influenza B by PCR NEGATIVE NEGATIVE Final    Comment: (NOTE) The Xpert Xpress SARS-CoV-2/FLU/RSV assay is intended as an aid in  the diagnosis of influenza from Nasopharyngeal swab specimens and  should not be used as a sole basis for treatment. Nasal washings and  aspirates are unacceptable for Xpert Xpress SARS-CoV-2/FLU/RSV  testing. Fact Sheet for Patients: PinkCheek.be Fact Sheet for Healthcare Providers: GravelBags.it This test is not yet approved or cleared by the [place] FDA and  has been authorized for detection and/or diagnosis of SARS-CoV-2 by  FDA under an Emergency Use Authorization (EUA). This EUA will remain  in effect (meaning this test can be used) for the duration of the  Covid-19 declaration under Section 564(b)(1) of the Act, 21  U.S.C. section 360bbb-3(b)(1), unless the authorization is  terminated or revoked. Performed at [facility], [address] [place], [place] [address]   MRSA PCR Screening     Status: None   Collection Time: 01/01/20  8:39 PM   Specimen: Nasopharyngeal Swab  Result Value Ref Range Status   MRSA by PCR NEGATIVE NEGATIVE Final    Comment:        The GeneXpert MRSA Assay (FDA approved for NASAL specimens only), is one component of a comprehensive MRSA colonization surveillance program. It is not intended to diagnose MRSA infection nor to guide or monitor treatment for MRSA infections. Performed at [facility], [address] [place], [place] [address]   Blood culture (routine x 2)     Status: None (Preliminary result)   Collection Time: 01/01/20  9:08 PM   Specimen: Right Antecubital; Blood  Result Value Ref Range Status   Specimen Description RIGHT ANTECUBITAL  Final   Special Requests   Final    Blood Culture adequate volume BOTTLES DRAWN AEROBIC AND ANAEROBIC   Culture   Final    NO GROWTH < 12 HOURS Performed at [facility], [address], [place],   [address]    Report Status PENDING  Incomplete  Blood culture (routine x 2)     Status: None (Preliminary result)   Collection Time: 01/01/20  9:09 PM   Specimen: BLOOD LEFT HAND  Result  Value Ref Range Status   Specimen Description BLOOD LEFT HAND  Final   Special Requests   Final    Blood Culture adequate volume BOTTLES DRAWN AEROBIC AND ANAEROBIC   Culture   Final    NO GROWTH < 12 HOURS Performed at [facility], [address] [place],  [address]    Report Status PENDING  Incomplete    Radiology Reports DG Chest 2 View  Result Date: 12/04/2019 CLINICAL DATA:  79 year old male with weakness. EXAM: CHEST - 2 VIEW COMPARISON:  Chest radiograph dated 07/16/2019. FINDINGS: There is shallow inspiration with bibasilar atelectasis. Developing infiltrate is less likely. Clinical correlation is recommended. No large pleural effusion or pneumothorax. Stable cardiac silhouette. No acute osseous pathology. IMPRESSION: Shallow inspiration with bibasilar atelectasis. No focal consolidation. Electronically Signed   By: Anner Crete M.D.   On: 12/04/2019 15:51   CT Head Wo Contrast  Result Date: 01/01/2020 CLINICAL DATA:  Fever and altered mental status. EXAM: CT HEAD WITHOUT CONTRAST TECHNIQUE: Contiguous axial images were obtained from the base of the skull through the vertex without intravenous contrast. COMPARISON:  December 04, 2019 FINDINGS: Brain: There is moderate severity cerebral atrophy with widening of the extra-axial spaces and ventricular dilatation. There are areas of decreased attenuation within the white matter tracts of the supratentorial brain, consistent with microvascular disease changes. Vascular: No hyperdense vessel or unexpected calcification. Skull: Normal. Negative for fracture or focal lesion. Sinuses/Orbits: No acute finding. Other: None. IMPRESSION: No acute intracranial pathology. Electronically Signed   By: Virgina Norfolk M.D.   On: 01/01/2020 22:05   CT  Head Wo Contrast  Result Date: 12/04/2019 CLINICAL DATA:  [place] today.  Hit head.  Unsteady on feet. EXAM: CT HEAD WITHOUT CONTRAST TECHNIQUE: Contiguous axial images were obtained from the base of the skull through the vertex without intravenous contrast. COMPARISON:  Head CT 04/23/2018 FINDINGS: Brain: Stable age related cerebral atrophy, ventriculomegaly and periventricular white matter disease. No extra-axial fluid collections are identified. No CT findings for acute hemispheric infarction or intracranial hemorrhage. No mass lesions. The brainstem and cerebellum are normal. Vascular: No hyperdense vessels or aneurysm. Skull: No skull fracture or lytic lesions. There are few small scattered sclerotic bone lesions which are stable. Sinuses/Orbits: The paranasal sinuses and mastoid air cells are clear. The globes are intact Other: No scalp lesions or hematoma. IMPRESSION: 1. Stable age related cerebral atrophy, ventriculomegaly and periventricular white matter disease. 2. No acute intracranial findings or skull fractures. Electronically Signed   By: Marijo Sanes M.D.   On: 12/04/2019 17:17   DG CHEST PORT 1 VIEW  Result Date: 01/02/2020 CLINICAL DATA:  Respiratory distress. Fever. COVID positive. EXAM: PORTABLE CHEST 1 VIEW COMPARISON:  Chest radiograph yesterday. FINDINGS: Unchanged elevation of right hemidiaphragm. Increasing patchy opacity at the right lung base. Normal heart size with unchanged mediastinal contours. Aortic tortuosity. No visualized pleural effusion. No pneumothorax. IMPRESSION: Increasing patchy opacity at the right lung base, may be atelectasis or pneumonia. Electronically Signed   By: Keith Rake M.D.   On: 01/02/2020 05:55   DG Chest Port 1 View  Result Date: 01/01/2020 CLINICAL DATA:  Fever, altered mental status EXAM: PORTABLE CHEST 1 VIEW COMPARISON:  12/04/2019 FINDINGS: Low lung volumes reflecting shallow inspiration. No new consolidation or edema. Stable  cardiomediastinal contours. Chronic posterior left rib fractures. IMPRESSION: No acute process in the chest. Electronically Signed   By: Macy Mis M.D.   On: 01/01/2020 20:46     CBC Recent  Labs  Lab 01/01/20 2006 01/02/20 0152  WBC 2.5* 3.1*  HGB 16.1 14.7  HCT 51.7 46.6  PLT 56* 53*  MCV 89.3 88.8  MCH 27.8 28.0  MCHC 31.1 31.5  RDW 15.1 15.1  LYMPHSABS 0.6* 0.5*  MONOABS 0.2 0.3  EOSABS 0.0 0.0  BASOSABS 0.0 0.0    Chemistries  Recent Labs  Lab 01/01/20 2006 01/02/20 0152  NA 143 141  K 4.1 3.6  CL 107 113*  CO2 25 23  GLUCOSE 124* 147*  BUN 19 15  CREATININE 1.10 0.70  CALCIUM 9.6 8.4*  MG  --  1.8  AST 57* 40  ALT 47* 39  ALKPHOS 56 47  BILITOT 0.8 0.8   ------------------------------------------------------------------------------------------------------------------ No results for input(s): CHOL, HDL, LDLCALC, TRIG, CHOLHDL, LDLDIRECT in the last 72 hours.  Lab Results  Component Value Date   HGBA1C 5.3 04/24/2018   ------------------------------------------------------------------------------------------------------------------ No results for input(s): TSH, T4TOTAL, T3FREE, THYROIDAB in the last 72 hours.  Invalid input(s): FREET3 ------------------------------------------------------------------------------------------------------------------ Recent Labs    01/01/20 2108 01/02/20 0152  FERRITIN 225 195    Coagulation profile Recent Labs  Lab 01/01/20 2006  INR 1.1    Recent Labs    01/01/20 2006 01/02/20 0152  DDIMER 5.14* 5.71*    Cardiac Enzymes No results for input(s): CKMB, TROPONINI, MYOGLOBIN in the last 168 hours.  Invalid input(s): CK ------------------------------------------------------------------------------------------------------------------    Component Value Date/Time   BNP 31.0 05/06/2017 1612    Kimberli Winne M.D on 01/02/2020 at 6:39 PM  Go to [email or web address] - for contact info  [facility] -  Office  [phone]

## 2020-01-02 NOTE — Progress Notes (Addendum)
Dr. Myna Hidalgo made aware of respiratory status of 35-40 times a minute, adv that oxygen was increased to 4L/min. Also adv of Oxygen saturation of 95%. Will continue to monitor pt and Dr. Barron Alvine.

## 2020-01-02 NOTE — Progress Notes (Signed)
Dr. Myna Hidalgo updated once again on patients blood pressure and parameters for the Cardene. Pts BP 140/87- New order to d/c Cardene and start labetalol. Will continue to monitor pt

## 2020-01-02 NOTE — Progress Notes (Signed)
After advising Dr. Myna Hidalgo of patients respiratory status, new order for CXR in am. Will continue to monitor pt

## 2020-01-02 NOTE — Progress Notes (Signed)
Communicated with bedside RN Nira Conn for need to gather another LA due to first LA > 2 , RN to let MD know

## 2020-01-02 NOTE — Progress Notes (Signed)
CRITICAL VALUE ALERT  Critical Value:  Lactic acid 2.2  Date & Time Notied:  01/02/20 @ 0015  Provider Notified: Dr. Myna Hidalgo  Orders Received/Actions taken: No new orders- Lactic is trending down

## 2020-01-03 LAB — COMPREHENSIVE METABOLIC PANEL
ALT: 28 U/L (ref 0–44)
AST: 25 U/L (ref 15–41)
Albumin: 2.9 g/dL — ABNORMAL LOW (ref 3.5–5.0)
Alkaline Phosphatase: 41 U/L (ref 38–126)
Anion gap: 12 (ref 5–15)
BUN: 20 mg/dL (ref 8–23)
CO2: 25 mmol/L (ref 22–32)
Calcium: 9.1 mg/dL (ref 8.9–10.3)
Chloride: 109 mmol/L (ref 98–111)
Creatinine, Ser: 0.76 mg/dL (ref 0.61–1.24)
GFR calc Af Amer: >60 mL/min (ref >60)
GFR calc non Af Amer: >60 mL/min (ref >60)
Glucose, Bld: 109 mg/dL — ABNORMAL HIGH (ref 70–99)
Potassium: 3.6 mmol/L (ref 3.5–5.1)
Sodium: 146 mmol/L — ABNORMAL HIGH (ref 135–145)
Total Bilirubin: 0.5 mg/dL (ref 0.3–1.2)
Total Protein: 6.8 g/dL (ref 6.5–8.1)

## 2020-01-03 LAB — CBC WITH DIFFERENTIAL/PLATELET
Abs Immature Granulocytes: 0.02 10*3/uL (ref 0.00–0.07)
Basophils Absolute: 0 10*3/uL (ref 0.0–0.1)
Basophils Relative: 1 %
Eosinophils Absolute: 0 10*3/uL (ref 0.0–0.5)
Eosinophils Relative: 0 %
HCT: 44.5 % (ref 39.0–52.0)
Hemoglobin: 13.7 g/dL (ref 13.0–17.0)
Immature Granulocytes: 0 %
Lymphocytes Relative: 12 %
Lymphs Abs: 0.6 10*3/uL — ABNORMAL LOW (ref 0.7–4.0)
MCH: 27.6 pg (ref 26.0–34.0)
MCHC: 30.8 g/dL (ref 30.0–36.0)
MCV: 89.5 fL (ref 80.0–100.0)
Monocytes Absolute: 0.4 10*3/uL (ref 0.1–1.0)
Monocytes Relative: 7 %
Neutro Abs: 4.4 10*3/uL (ref 1.7–7.7)
Neutrophils Relative %: 80 %
Platelets: 50 10*3/uL — ABNORMAL LOW (ref 150–400)
RBC: 4.97 MIL/uL (ref 4.22–5.81)
RDW: 15 % (ref 11.5–15.5)
WBC: 5.4 10*3/uL (ref 4.0–10.5)
nRBC: 0 % (ref 0.0–0.2)

## 2020-01-03 LAB — FERRITIN: Ferritin: 241 ng/mL (ref 24–336)

## 2020-01-03 LAB — GLUCOSE, CAPILLARY
Glucose-Capillary: 84 mg/dL (ref 70–99)
Glucose-Capillary: 90 mg/dL (ref 70–99)
Glucose-Capillary: 93 mg/dL (ref 70–99)
Glucose-Capillary: 82 mg/dL (ref 70–99)

## 2020-01-03 LAB — URINE CULTURE

## 2020-01-03 LAB — MAGNESIUM: Magnesium: 2.2 mg/dL (ref 1.7–2.4)

## 2020-01-03 LAB — C-REACTIVE PROTEIN: CRP: 13.9 mg/dL — ABNORMAL HIGH (ref <1.0)

## 2020-01-03 LAB — D-DIMER, QUANTITATIVE: D-Dimer, Quant: 5.16 ug/mL-FEU — ABNORMAL HIGH (ref 0.00–0.50)

## 2020-01-03 MED ORDER — LABETALOL HCL 5 MG/ML IV SOLN: 10.0000 mg | INTRAVENOUS | Status: DC | PRN

## 2020-01-03 MED ORDER — HYDRALAZINE HCL 20 MG/ML IJ SOLN
10.0000 mg | INTRAMUSCULAR | Status: DC | PRN
  Administered 2020-01-03 – 2020-01-05 (×3): 10 mg via INTRAVENOUS
  Filled 2020-01-03 (×4): qty 1

## 2020-01-03 NOTE — Progress Notes (Signed)
Patient Demographics:    Henry Smith, is a 79 y.o. male, DOB - 1941-04-16, [id number]  Admit date - 01/01/2020   Admitting Physician Vianne Bulls, MD  Outpatient Primary MD for the patient is Caprice Renshaw, MD  LOS - 2   Chief Complaint  Patient presents with  . Altered Mental Status        Subjective:    Henry Smith today has no fevers, no emesis,  No chest pain,   Swallowing Better,  following commands    Assessment  & Plan :    Principal Problem:   Acute respiratory failure with hypoxia (HCC) Active Problems:   COVID-19 virus infection   Thrombocytopenia (HCC)   Schizoaffective disorder, bipolar type ([place])   Acute encephalopathy   Sepsis ([place])   Dementia associated with neurosyphilis [facility])   Hypertensive urgency  Brief Summary:  79 y.o. male with medical history significant for dementia associated with neurosyphilis, schizoaffective disorder, chronic thrombocytopenia and recurrent leukopenia, hypertension, and history of prostate cancer found to have COVID-19 respiratory infection  A/p 1)Acute hypoxic respiratory infection secondary to COVID-19--continue remdesivir and Decadron started on 01/01/2020 -Follow inflammatory markers -Stop IV Flagyl, stop vancomycin stop cefepime, okay to treat empirically with Rocephin and azithromycin for possible concomitant CAP -Overall respiratory status and oxygen requirement improving  2) acute metabolic encephalopathy secondary to #1 above superimposed on underlying dementia -Serum ammonia is not elevated -Mentation improving significantly  3)Leukopenia and thrombocytopenia--- appears chronic, may worsen with COVID-19 infection -No bleeding noted  4)Dysphagia concerns--speech eval appreciated, reevaluated, doing better, okay to try dysphagia 2 diet with thin liquids   5)HTN-had hypotension on nicardipine drip, currently off  nicardipine drip, okay to continue p.o. losartan 75 mg daily, metoprolol 12.5 mg twice daily, may use as needed IV hydralazine as needed elevated BP  6)Dementia--- continue Depakote 500 mg twice daily, Paxil and haloperidol  Disposition/Need for in-Hospital Stay- patient unable to be discharged at this time due to --COVID-19 respiratory infection requiring IV remdesivir and Decadron-apparently patient's SNF facility unable to arrange for outpatient remdesivir infusion, so patient has to complete 5 days of infusion in the hospital prior to discharge to SNF   Code Status : Full  Family Communication:   (patient is alert, awake and coherent)   Disposition Plan  : SNF rehab after completing IV remdesivir infusion x5 days  Consults  : Speech pathologist  DVT Prophylaxis  :   - SCDs (low Platelets)  Lab Results  Component Value Date   PLT 50 (L) 01/03/2020    Inpatient Medications  Scheduled Meds: . amantadine  100 mg Oral BID  . Chlorhexidine Gluconate Cloth  6 each Topical Daily  . dexamethasone (DECADRON) injection  6 mg Intravenous Q24H  . diclofenac sodium  4 g Topical QID  . divalproex  1,250 mg Oral QHS  . divalproex  500 mg Oral BID  . gabapentin  300 mg Oral TID  . haloperidol  5 mg Oral BID  . insulin aspart  0-5 Units Subcutaneous QHS  . insulin aspart  0-9 Units Subcutaneous TID WC  . latanoprost  1 drop Both Eyes QHS  . losartan  75 mg Oral Daily  . mouth rinse  15 mL Mouth Rinse BID  .  metoprolol tartrate  12.5 mg Oral BID  . PARoxetine  20 mg Oral Daily  . sodium chloride flush  3 mL Intravenous Q12H   Continuous Infusions: . azithromycin Stopped (01/03/20 0845)  . cefTRIAXone (ROCEPHIN)  IV Stopped (01/03/20 [id number])  . dextrose 5 % and 0.45% NaCl 50 mL/hr at 01/03/20 1830  . remdesivir 100 mg in NS 100 mL Stopped (01/03/20 1001)   PRN Meds:.acetaminophen, acetaminophen **OR** [DISCONTINUED] acetaminophen, hydrALAZINE, ondansetron **OR** ondansetron (ZOFRAN)  IV    Anti-infectives (From admission, onward)   Start     Dose/Rate Route Frequency Ordered Stop   01/02/20 2200  vancomycin (VANCOREADY) IVPB 1500 mg/300 mL  Status:  Discontinued     1,500 mg 150 mL/hr over 120 Minutes Intravenous Every 24 hours 01/01/20 2134 01/02/20 0817   01/02/20 1600  remdesivir 100 mg in sodium chloride 0.9 % 100 mL IVPB     100 mg 200 mL/hr over 30 Minutes Intravenous Daily 01/01/20 2202 01/06/20 0959   01/02/20 0830  cefTRIAXone (ROCEPHIN) 2 g in sodium chloride 0.9 % 100 mL IVPB     2 g 200 mL/hr over 30 Minutes Intravenous Every 24 hours 01/02/20 0817     01/02/20 0830  azithromycin (ZITHROMAX) 500 mg in sodium chloride 0.9 % 250 mL IVPB     500 mg 250 mL/hr over 60 Minutes Intravenous Every 24 hours 01/02/20 0817     01/02/20 0400  ceFEPIme (MAXIPIME) 2 g in sodium chloride 0.9 % 100 mL IVPB  Status:  Discontinued     2 g 200 mL/hr over 30 Minutes Intravenous Every 8 hours 01/01/20 2134 01/02/20 0817   01/02/20 0015  metroNIDAZOLE (FLAGYL) IVPB 500 mg  Status:  Discontinued     500 mg 100 mL/hr over 60 Minutes Intravenous Every 8 hours 01/01/20 2252 01/02/20 0816   01/01/20 2215  remdesivir 200 mg in sodium chloride 0.9% 250 mL IVPB     200 mg 580 mL/hr over 30 Minutes Intravenous Once 01/01/20 2202 01/02/20 0107   01/01/20 2115  vancomycin (VANCOCIN) IVPB 1000 mg/200 mL premix     1,000 mg 200 mL/hr over 60 Minutes Intravenous  Once 01/01/20 2106 01/02/20 0001   01/01/20 2030  vancomycin (VANCOCIN) IVPB 1000 mg/200 mL premix     1,000 mg 200 mL/hr over 60 Minutes Intravenous  Once 01/01/20 2016 01/01/20 2234   01/01/20 2030  ceFEPIme (MAXIPIME) 2 g in sodium chloride 0.9 % 100 mL IVPB     2 g 200 mL/hr over 30 Minutes Intravenous  Once 01/01/20 2016 01/01/20 2139        Objective:   Vitals:   01/03/20 1105 01/03/20 1400 01/03/20 1500 01/03/20 1600  BP:  (!) 178/82 (!) 174/78   Pulse:  (!) 52 (!) 54   Resp:      Temp:    98 F (36.7 C)   TempSrc:    Oral  SpO2: 97% 100% 96%   Weight:      Height:        Wt Readings from Last 3 Encounters:  01/01/20 93.9 kg  06/12/19 93.9 kg  07/31/18 93.9 kg     Intake/Output Summary (Last 24 hours) at 01/03/2020 1834 Last data filed at 01/03/2020 1732 Gross per 24 hour  Intake 1603 ml  Output 775 ml  Net 828 ml    Physical Exam  Gen:- Awake Alert, resting comfortably HEENT:- [place].AT, No sclera icterus Nose- [place] 2L/min Neck-Supple Neck,No JVD,.  Lungs-diminished in bases, no  wheezing  CV- S1, S2 normal, regular  Abd-  +ve B.Sounds, Abd Soft, No tenderness,    Extremity/Skin:- ,No  edema, pedal pulses present  Psych-affect is more appropriate disoriented, cognitive and memory deficits at baseline,  Neuro-generalized weakness, no new focal deficits, no tremors   Data Review:   Micro Results Recent Results (from the past 240 hour(s))  Urine Culture     Status: Abnormal   Collection Time: 01/01/20  8:36 PM   Specimen: Urine, Clean Catch  Result Value Ref Range Status   Specimen Description   Final    URINE, CLEAN CATCH Performed at [facility], [address], [place], [place] [address]    Special Requests   Final    NONE Performed at [facility], [address] [place], [place] [address]    Culture MULTIPLE SPECIES PRESENT, SUGGEST RECOLLECTION (A)  Final   Report Status 01/03/2020 FINAL  Final  Respiratory Panel by RT PCR (Flu A&B, Covid) - Nasopharyngeal Swab     Status: Abnormal   Collection Time: 01/01/20  8:39 PM   Specimen: Nasopharyngeal Swab  Result Value Ref Range Status   SARS Coronavirus 2 by RT PCR POSITIVE (A) NEGATIVE Final    Comment: RESULT CALLED TO, READ BACK BY AND VERIFIED WITH: H CRAWFORD,RN @2134  01/01/20 MKELLY (NOTE) SARS-CoV-2 target nucleic acids are DETECTED. SARS-CoV-2 RNA is generally detectable in upper respiratory specimens  during the acute phase of infection. Positive results are indicative of the presence of the identified  virus, but do not rule out bacterial infection or co-infection with other pathogens not detected by the test. Clinical correlation with patient history and other diagnostic information is necessary to determine patient infection status. The expected result is Negative. Fact Sheet for Patients:  PinkCheek.be Fact Sheet for Healthcare Providers: GravelBags.it This test is not yet approved or cleared by the [place] FDA and  has been authorized for detection and/or diagnosis of SARS-CoV-2 by FDA under an Emergency Use Authorization (EUA).  This EUA will remain in effect (meaning this test can be used) f or the duration of  the COVID-19 declaration under Section 564(b)(1) of the Act, 21 U.S.C. section 360bbb-3(b)(1), unless the authorization is terminated or revoked sooner.    Influenza A by PCR NEGATIVE NEGATIVE Final   Influenza B by PCR NEGATIVE NEGATIVE Final    Comment: (NOTE) The Xpert Xpress SARS-CoV-2/FLU/RSV assay is intended as an aid in  the diagnosis of influenza from Nasopharyngeal swab specimens and  should not be used as a sole basis for treatment. Nasal washings and  aspirates are unacceptable for Xpert Xpress SARS-CoV-2/FLU/RSV  testing. Fact Sheet for Patients: PinkCheek.be Fact Sheet for Healthcare Providers: GravelBags.it This test is not yet approved or cleared by the [place] FDA and  has been authorized for detection and/or diagnosis of SARS-CoV-2 by  FDA under an Emergency Use Authorization (EUA). This EUA will remain  in effect (meaning this test can be used) for the duration of the  Covid-19 declaration under Section 564(b)(1) of the Act, 21  U.S.C. section 360bbb-3(b)(1), unless the authorization is  terminated or revoked. Performed at [facility], [address], [place], [place] [address]   MRSA PCR Screening     Status: None    Collection Time: 01/01/20  8:39 PM   Specimen: Nasopharyngeal Swab  Result Value Ref Range Status   MRSA by PCR NEGATIVE NEGATIVE Final    Comment:        The GeneXpert MRSA Assay (  FDA approved for NASAL specimens only), is one component of a comprehensive MRSA colonization surveillance program. It is not intended to diagnose MRSA infection nor to guide or monitor treatment for MRSA infections. Performed at [facility], [address] [place], [place] [address]   Blood culture (routine x 2)     Status: None (Preliminary result)   Collection Time: 01/01/20  9:08 PM   Specimen: Right Antecubital; Blood  Result Value Ref Range Status   Specimen Description RIGHT ANTECUBITAL  Final   Special Requests   Final    Blood Culture adequate volume BOTTLES DRAWN AEROBIC AND ANAEROBIC   Culture   Final    NO GROWTH 2 DAYS Performed at [facility], [address], [place], [place] [address]    Report Status PENDING  Incomplete  Blood culture (routine x 2)     Status: None (Preliminary result)   Collection Time: 01/01/20  9:09 PM   Specimen: BLOOD LEFT HAND  Result Value Ref Range Status   Specimen Description BLOOD LEFT HAND  Final   Special Requests   Final    Blood Culture adequate volume BOTTLES DRAWN AEROBIC AND ANAEROBIC   Culture   Final    NO GROWTH 2 DAYS Performed at [facility], [address] [place], [place] [address]    Report Status PENDING  Incomplete    Radiology Reports CT Head Wo Contrast  Result Date: 01/01/2020 CLINICAL DATA:  Fever and altered mental status. EXAM: CT HEAD WITHOUT CONTRAST TECHNIQUE: Contiguous axial images were obtained from the base of the skull through the vertex without intravenous contrast. COMPARISON:  December 04, 2019 FINDINGS: Brain: There is moderate severity cerebral atrophy with widening of the extra-axial spaces and ventricular dilatation. There are areas of decreased attenuation within the white matter tracts of the supratentorial  brain, consistent with microvascular disease changes. Vascular: No hyperdense vessel or unexpected calcification. Skull: Normal. Negative for fracture or focal lesion. Sinuses/Orbits: No acute finding. Other: None. IMPRESSION: No acute intracranial pathology. Electronically Signed   By: Virgina Norfolk M.D.   On: 01/01/2020 22:05   DG CHEST PORT 1 VIEW  Result Date: 01/02/2020 CLINICAL DATA:  Respiratory distress. Fever. COVID positive. EXAM: PORTABLE CHEST 1 VIEW COMPARISON:  Chest radiograph yesterday. FINDINGS: Unchanged elevation of right hemidiaphragm. Increasing patchy opacity at the right lung base. Normal heart size with unchanged mediastinal contours. Aortic tortuosity. No visualized pleural effusion. No pneumothorax. IMPRESSION: Increasing patchy opacity at the right lung base, may be atelectasis or pneumonia. Electronically Signed   By: Keith Rake M.D.   On: 01/02/2020 05:55   DG Chest Port 1 View  Result Date: 01/01/2020 CLINICAL DATA:  Fever, altered mental status EXAM: PORTABLE CHEST 1 VIEW COMPARISON:  12/04/2019 FINDINGS: Low lung volumes reflecting shallow inspiration. No new consolidation or edema. Stable cardiomediastinal contours. Chronic posterior left rib fractures. IMPRESSION: No acute process in the chest. Electronically Signed   By: Macy Mis M.D.   On: 01/01/2020 20:46     CBC Recent Labs  Lab 01/01/20 2006 01/02/20 0152 01/03/20 0348  WBC 2.5* 3.1* 5.4  HGB 16.1 14.7 13.7  HCT 51.7 46.6 44.5  PLT 56* 53* 50*  MCV 89.3 88.8 89.5  MCH 27.8 28.0 27.6  MCHC 31.1 31.5 30.8  RDW 15.1 15.1 15.0  LYMPHSABS 0.6* 0.5* 0.6*  MONOABS 0.2 0.3 0.4  EOSABS 0.0 0.0 0.0  BASOSABS 0.0 0.0 0.0    Chemistries  Recent Labs  Lab 01/01/20 2006 01/02/20 0152  01/03/20 0348  NA 143 141 146*  K 4.1 3.6 3.6  CL 107 113* 109  CO2 25 23 25   GLUCOSE 124* 147* 109*  BUN 19 15 20   CREATININE 1.10 0.70 0.76  CALCIUM 9.6 8.4* 9.1  MG  --  1.8 2.2  AST 57* 40 25    ALT 47* 39 28  ALKPHOS 56 47 41  BILITOT 0.8 0.8 0.5   ------------------------------------------------------------------------------------------------------------------ No results for input(s): CHOL, HDL, LDLCALC, TRIG, CHOLHDL, LDLDIRECT in the last 72 hours.  Lab Results  Component Value Date   HGBA1C 5.3 04/24/2018   ------------------------------------------------------------------------------------------------------------------ No results for input(s): TSH, T4TOTAL, T3FREE, THYROIDAB in the last 72 hours.  Invalid input(s): FREET3 ------------------------------------------------------------------------------------------------------------------ Recent Labs    01/02/20 0152 01/03/20 0348  FERRITIN 195 241    Coagulation profile Recent Labs  Lab 01/01/20 2006  INR 1.1    Recent Labs    01/02/20 0152 01/03/20 0348  DDIMER 5.71* 5.16*    Cardiac Enzymes No results for input(s): CKMB, TROPONINI, MYOGLOBIN in the last 168 hours.  Invalid input(s): CK ------------------------------------------------------------------------------------------------------------------    Component Value Date/Time   BNP 31.0 05/06/2017 1612    Slade Pierpoint M.D on 01/03/2020 at 6:34 PM  Go to [email or web address] - for contact info  [facility] - Office  [phone]

## 2020-01-03 NOTE — Progress Notes (Signed)
  Speech Language Pathology Treatment: Dysphagia  Patient Details Name: Henry Smith MRN: [id number] DOB: 1941/07/03 Today's Date: 01/03/2020 Time: [id number] SLP Time Calculation (min) (ACUTE ONLY): 22 min  Assessment / Plan / Recommendation Clinical Impression  Pt seen for ongoing diagnostic dysphagia intervention following BSE yesterday. Pt is more alert today, however RN reports cognition fluctuates with swallowing during administration of medication. Pt reportedly help bolus and swished puree around at times and need verbal cues to swallow or expectorate. During SLP visit, Pt consumed 4 oz puree, mech soft textures, and cup/straw sips of thin liquids with one delayed cough (unable to determine if related to po intake). Pt with improved attention to task and did not hold bolus this session. Given fluctuation in mental status, will proceed with modified diet of D2 and thin liquids and require 100% supervision with po intake at this time. Above to RN and SLP will continue to follow.    HPI HPI: Henry Smith is a 79 y.o. male with medical history significant for dementia associated with neurosyphilis, schizoaffective disorder, chronic thrombocytopenia and recurrent leukopenia, hypertension, and history of prostate cancer, now presenting to the emergency department from his nursing facility for evaluation of decreased level of consciousness and fevers.  Patient has reportedly had an alteration in his mental status for the past 2 days at his nursing facility, was being fed a meal this evening, stopped chewing and was not responding to instructions.  EMS was called and the patient was found to be febrile.  He was brought into the ED for evaluation.  Patient is unable to contribute to the history due to his clinical condition. COVID-19+, BSE reqeusted      SLP Plan  Continue with current plan of care       Recommendations  Diet recommendations: Dysphagia 2 (fine chop);Thin liquid Liquids  provided via: Cup;Straw Medication Administration: Whole meds with puree Supervision: Staff to assist with self feeding;Full supervision/cueing for compensatory strategies Compensations: Slow rate;Small sips/bites Postural Changes and/or Swallow Maneuvers: Seated upright 90 degrees;Upright 30-60 min after meal                Oral Care Recommendations: Oral care BID;Staff/trained caregiver to provide oral care Follow up Recommendations: Skilled Nursing facility SLP Visit Diagnosis: Dysphagia, unspecified (R13.10) Plan: Continue with current plan of care       Thank you,  Genene Churn, [place]                [place] 01/03/2020, 12:54 PM

## 2020-01-04 LAB — COMPREHENSIVE METABOLIC PANEL
ALT: 21 U/L (ref 0–44)
AST: 19 U/L (ref 15–41)
Albumin: 2.6 g/dL — ABNORMAL LOW (ref 3.5–5.0)
Alkaline Phosphatase: 42 U/L (ref 38–126)
Anion gap: 9 (ref 5–15)
BUN: 21 mg/dL (ref 8–23)
CO2: 23 mmol/L (ref 22–32)
Calcium: 8.9 mg/dL (ref 8.9–10.3)
Chloride: 111 mmol/L (ref 98–111)
Creatinine, Ser: 0.71 mg/dL (ref 0.61–1.24)
GFR calc Af Amer: >60 mL/min (ref >60)
GFR calc non Af Amer: >60 mL/min (ref >60)
Glucose, Bld: 131 mg/dL — ABNORMAL HIGH (ref 70–99)
Potassium: 3.3 mmol/L — ABNORMAL LOW (ref 3.5–5.1)
Sodium: 143 mmol/L (ref 135–145)
Total Bilirubin: 0.4 mg/dL (ref 0.3–1.2)
Total Protein: 5.9 g/dL — ABNORMAL LOW (ref 6.5–8.1)

## 2020-01-04 LAB — CBC WITH DIFFERENTIAL/PLATELET
Abs Immature Granulocytes: 0.05 10*3/uL (ref 0.00–0.07)
Basophils Absolute: 0 10*3/uL (ref 0.0–0.1)
Basophils Relative: 0 %
Eosinophils Absolute: 0 10*3/uL (ref 0.0–0.5)
Eosinophils Relative: 0 %
HCT: 41.3 % (ref 39.0–52.0)
Hemoglobin: 13 g/dL (ref 13.0–17.0)
Immature Granulocytes: 1 %
Lymphocytes Relative: 9 %
Lymphs Abs: 0.5 10*3/uL — ABNORMAL LOW (ref 0.7–4.0)
MCH: 27.7 pg (ref 26.0–34.0)
MCHC: 31.5 g/dL (ref 30.0–36.0)
MCV: 88.1 fL (ref 80.0–100.0)
Monocytes Absolute: 0.2 10*3/uL (ref 0.1–1.0)
Monocytes Relative: 4 %
Neutro Abs: 5.1 10*3/uL (ref 1.7–7.7)
Neutrophils Relative %: 86 %
Platelets: 59 10*3/uL — ABNORMAL LOW (ref 150–400)
RBC: 4.69 MIL/uL (ref 4.22–5.81)
RDW: 14.9 % (ref 11.5–15.5)
WBC: 5.9 10*3/uL (ref 4.0–10.5)
nRBC: 0 % (ref 0.0–0.2)

## 2020-01-04 LAB — GLUCOSE, CAPILLARY
Glucose-Capillary: 98 mg/dL (ref 70–99)
Glucose-Capillary: 139 mg/dL — ABNORMAL HIGH (ref 70–99)
Glucose-Capillary: 134 mg/dL — ABNORMAL HIGH (ref 70–99)
Glucose-Capillary: 115 mg/dL — ABNORMAL HIGH (ref 70–99)

## 2020-01-04 LAB — C-REACTIVE PROTEIN: CRP: 7.1 mg/dL — ABNORMAL HIGH (ref <1.0)

## 2020-01-04 LAB — FERRITIN: Ferritin: 239 ng/mL (ref 24–336)

## 2020-01-04 LAB — D-DIMER, QUANTITATIVE: D-Dimer, Quant: 3.15 ug/mL-FEU — ABNORMAL HIGH (ref 0.00–0.50)

## 2020-01-04 LAB — MAGNESIUM: Magnesium: 2.1 mg/dL (ref 1.7–2.4)

## 2020-01-04 MED ORDER — POTASSIUM CHLORIDE CRYS ER 20 MEQ PO TBCR
40.0000 meq | EXTENDED_RELEASE_TABLET | ORAL | Status: AC
  Administered 2020-01-04 (×2): 40 meq via ORAL
  Filled 2020-01-04 (×2): qty 2

## 2020-01-04 NOTE — Progress Notes (Signed)
  Speech Language Pathology Treatment: Dysphagia  Patient Details Name: Henry Smith MRN: [id number] DOB: 1941-06-03 Today's Date: 01/04/2020 Time: [id number] SLP Time Calculation (min) (ACUTE ONLY): 23 min  Assessment / Plan / Recommendation Clinical Impression  Pt seen for ongoing dysphagia intervention. RN reports that Pt is tolerating medications and diet well. SLP assisted feeding Pt his lunch meal. Pt presents with min lingual residue with solids and delayed congested coughing intermittently throughout visit (difficult to determine if related to po intake). Pt is difficult to position on bed as he is unable to help maneuver himself and slides back down in the bed. He made attempts at self presenting liquids, however difficult due to IVs. Continue diet as ordered, upgrade to mechanical soft is likely once Pt able to self feed better. Above to RN. SLP will follow.   HPI HPI: Henry Smith is a 79 y.o. male with medical history significant for dementia associated with neurosyphilis, schizoaffective disorder, chronic thrombocytopenia and recurrent leukopenia, hypertension, and history of prostate cancer, now presenting to the emergency department from his nursing facility for evaluation of decreased level of consciousness and fevers.  Patient has reportedly had an alteration in his mental status for the past 2 days at his nursing facility, was being fed a meal this evening, stopped chewing and was not responding to instructions.  EMS was called and the patient was found to be febrile.  He was brought into the ED for evaluation.  Patient is unable to contribute to the history due to his clinical condition. COVID-19+, BSE reqeusted      SLP Plan  Continue with current plan of care       Recommendations  Diet recommendations: Dysphagia 2 (fine chop);Thin liquid Liquids provided via: Cup;Straw Medication Administration: Whole meds with puree Supervision: Staff to assist with self  feeding;Full supervision/cueing for compensatory strategies Compensations: Slow rate;Small sips/bites Postural Changes and/or Swallow Maneuvers: Seated upright 90 degrees;Upright 30-60 min after meal                Oral Care Recommendations: Oral care BID;Staff/trained caregiver to provide oral care Follow up Recommendations: Skilled Nursing facility SLP Visit Diagnosis: Dysphagia, unspecified (R13.10) Plan: Continue with current plan of care       Thank you,  Genene Churn, [place]                 [place] 01/04/2020, 2:37 PM

## 2020-01-04 NOTE — Progress Notes (Signed)
Patient Demographics:    Henry Smith, is a 79 y.o. male, DOB - Apr 25, 1941, [id number]  Admit date - 01/01/2020   Admitting Physician Vianne Bulls, MD  Outpatient Primary MD for the patient is Caprice Renshaw, MD  LOS - 3   Chief Complaint  Patient presents with  . 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 male with medical history significant for dementia associated with neurosyphilis, schizoaffective disorder, chronic thrombocytopenia and recurrent leukopenia, hypertension, and history of prostate cancer found to have COVID-19 respiratory infection -SNF is requesting that patient complete IV remdesivir here on 01/05/2020 prior to returning to SNF  A/p 1)Acute hypoxic respiratory infection secondary to COVID-19--continue remdesivir and Decadron started on 01/01/2020 -Follow inflammatory markers -Stop IV Flagyl, stop vancomycin stop cefepime, okay to treat empirically with Rocephin and azithromycin for possible concomitant CAP -Hypoxia has resolved -Overall respiratory status and oxygen requirement improving  2) acute metabolic encephalopathy secondary to #1 above superimposed on underlying dementia -Serum ammonia is not elevated -Mentation improving significantly  3)Leukopenia and thrombocytopenia--- appears chronic, may worsen with COVID-19 infection -Leukopenia improving -No bleeding noted  4)Dysphagia concerns--speech eval  appreciated, reevaluated, doing better, okay to try dysphagia 2 diet with thin liquids   5)HTN-had hypotension on nicardipine drip, currently off nicardipine drip, okay to continue p.o. losartan 75 mg daily, metoprolol 12.5 mg twice daily, may use as needed IV hydralazine as needed elevated BP  6)Dementia--- continue Depakote 500 mg twice daily, Paxil and haloperidol  Disposition/Need for in-Hospital Stay- patient unable to be discharged at this time due to --COVID-19 respiratory infection requiring IV remdesivir and Decadron-apparently patient's SNF facility unable to arrange for outpatient remdesivir infusion, so patient has to complete 5 days of infusion in the hospital prior to discharge to SNF -SNF is requesting that patient complete IV remdesivir here on 01/05/2020 prior to returning to SNF   Code Status : Full  Family Communication:   (patient is alert, awake and coherent)   Disposition Plan  : SNF rehab after completing IV remdesivir infusion x5 days--SNF is requesting that patient complete IV remdesivir here on 01/05/2020 prior to returning to SNF  Consults  : Speech pathologist  DVT Prophylaxis  :   - SCDs (low Platelets)  Lab Results  Component Value Date   PLT 59 (L) 01/04/2020    Inpatient Medications  Scheduled Meds: . amantadine  100 mg Oral BID  . Chlorhexidine Gluconate Cloth  6 each Topical Daily  . dexamethasone (DECADRON) injection  6 mg Intravenous Q24H  . diclofenac sodium  4 g Topical QID  . divalproex  1,250 mg Oral QHS  . divalproex  500 mg Oral BID  . gabapentin  300 mg Oral TID  .  haloperidol  5 mg Oral BID  . insulin aspart  0-5 Units Subcutaneous QHS  . insulin aspart  0-9 Units Subcutaneous TID WC  . latanoprost  1 drop Both Eyes QHS  . losartan  75 mg Oral Daily  . mouth rinse  15 mL Mouth Rinse BID  . metoprolol tartrate  12.5 mg Oral BID  . PARoxetine  20 mg Oral Daily  . sodium chloride flush  3 mL Intravenous Q12H   Continuous  Infusions: . azithromycin Stopped (01/04/20 [id number])  . cefTRIAXone (ROCEPHIN)  IV Stopped (01/04/20 [id number])  . dextrose 5 % and 0.45% NaCl 50 mL/hr at 01/03/20 1830  . remdesivir 100 mg in NS 100 mL 100 mg (01/04/20 1116)   PRN Meds:.acetaminophen, acetaminophen **OR** [DISCONTINUED] acetaminophen, hydrALAZINE, ondansetron **OR** ondansetron (ZOFRAN) IV    Anti-infectives (From admission, onward)   Start     Dose/Rate Route Frequency Ordered Stop   01/02/20 2200  vancomycin (VANCOREADY) IVPB 1500 mg/300 mL  Status:  Discontinued     1,500 mg 150 mL/hr over 120 Minutes Intravenous Every 24 hours 01/01/20 2134 01/02/20 0817   01/02/20 1600  remdesivir 100 mg in sodium chloride 0.9 % 100 mL IVPB     100 mg 200 mL/hr over 30 Minutes Intravenous Daily 01/01/20 2202 01/06/20 0959   01/02/20 0830  cefTRIAXone (ROCEPHIN) 2 g in sodium chloride 0.9 % 100 mL IVPB     2 g 200 mL/hr over 30 Minutes Intravenous Every 24 hours 01/02/20 0817     01/02/20 0830  azithromycin (ZITHROMAX) 500 mg in sodium chloride 0.9 % 250 mL IVPB     500 mg 250 mL/hr over 60 Minutes Intravenous Every 24 hours 01/02/20 0817     01/02/20 0400  ceFEPIme (MAXIPIME) 2 g in sodium chloride 0.9 % 100 mL IVPB  Status:  Discontinued     2 g 200 mL/hr over 30 Minutes Intravenous Every 8 hours 01/01/20 2134 01/02/20 0817   01/02/20 0015  metroNIDAZOLE (FLAGYL) IVPB 500 mg  Status:  Discontinued     500 mg 100 mL/hr over 60 Minutes Intravenous Every 8 hours 01/01/20 2252 01/02/20 0816   01/01/20 2215  remdesivir 200 mg in sodium chloride 0.9% 250 mL IVPB     200 mg 580 mL/hr over 30 Minutes Intravenous Once 01/01/20 2202 01/02/20 0107   01/01/20 2115  vancomycin (VANCOCIN) IVPB 1000 mg/200 mL premix     1,000 mg 200 mL/hr over 60 Minutes Intravenous  Once 01/01/20 2106 01/02/20 0001   01/01/20 2030  vancomycin (VANCOCIN) IVPB 1000 mg/200 mL premix     1,000 mg 200 mL/hr over 60 Minutes Intravenous  Once 01/01/20 2016 01/01/20  2234   01/01/20 2030  ceFEPIme (MAXIPIME) 2 g in sodium chloride 0.9 % 100 mL IVPB     2 g 200 mL/hr over 30 Minutes Intravenous  Once 01/01/20 2016 01/01/20 2139        Objective:   Vitals:   01/04/20 0900 01/04/20 1000 01/04/20 1043 01/04/20 1100  BP: (!) 149/83 (!) 154/86  (!) 159/70  Pulse: 75 69 77 83  Resp: (!) 21 20 14 20   Temp:   97.8 F (36.6 C)   TempSrc:   Oral   SpO2: 99% 99% 95% 93%  Weight:      Height:        Wt Readings from Last 3 Encounters:  01/04/20 96.2 kg  06/12/19 93.9 kg  07/31/18 93.9 kg     Intake/Output Summary (  Last 24 hours) at 01/04/2020 1354 Last data filed at 01/04/2020 1100 Gross per 24 hour  Intake 1686.13 ml  Output 750 ml  Net 936.13 ml    Physical Exam  Gen:- Awake Alert, resting comfortably HEENT:- [place].AT, No sclera icterus Neck-Supple Neck,No JVD,.  Lungs-diminished in bases, no wheezing  CV- S1, S2 normal, regular  Abd-  +ve B.Sounds, Abd Soft, No tenderness,    Extremity/Skin:- ,No  edema, pedal pulses present  Psych-affect is more appropriate , patient still has cognitive and memory deficits at baseline,  Neuro-generalized weakness, no new focal deficits, no tremors   Data Review:   Micro Results Recent Results (from the past 240 hour(s))  Urine Culture     Status: Abnormal   Collection Time: 01/01/20  8:36 PM   Specimen: Urine, Clean Catch  Result Value Ref Range Status   Specimen Description   Final    URINE, CLEAN CATCH Performed at [facility], [address], [place], [place] [address]    Special Requests   Final    NONE Performed at [facility], [address], [place], [place] [address]    [place], SUGGEST RECOLLECTION (A)  Final   Report Status 01/03/2020 FINAL  Final  Respiratory Panel by RT PCR (Flu A&B, Covid) - Nasopharyngeal Swab     Status: Abnormal   Collection Time: 01/01/20  8:39 PM   Specimen: Nasopharyngeal Swab  Result Value Ref Range Status   SARS Coronavirus 2  by RT PCR POSITIVE (A) NEGATIVE Final    Comment: RESULT CALLED TO, READ BACK BY AND VERIFIED WITH: H CRAWFORD,RN @2134  01/01/20 MKELLY (NOTE) SARS-CoV-2 target nucleic acids are DETECTED. SARS-CoV-2 RNA is generally detectable in upper respiratory specimens  during the acute phase of infection. Positive results are indicative of the presence of the identified virus, but do not rule out bacterial infection or co-infection with other pathogens not detected by the test. Clinical correlation with patient history and other diagnostic information is necessary to determine patient infection status. The expected result is Negative. Fact Sheet for Patients:  PinkCheek.be Fact Sheet for Healthcare Providers: GravelBags.it This test is not yet approved or cleared by the [place] FDA and  has been authorized for detection and/or diagnosis of SARS-CoV-2 by FDA under an Emergency Use Authorization (EUA).  This EUA will remain in effect (meaning this test can be used) f or the duration of  the COVID-19 declaration under Section 564(b)(1) of the Act, 21 U.S.C. section 360bbb-3(b)(1), unless the authorization is terminated or revoked sooner.    Influenza A by PCR NEGATIVE NEGATIVE Final   Influenza B by PCR NEGATIVE NEGATIVE Final    Comment: (NOTE) The Xpert Xpress SARS-CoV-2/FLU/RSV assay is intended as an aid in  the diagnosis of influenza from Nasopharyngeal swab specimens and  should not be used as a sole basis for treatment. Nasal washings and  aspirates are unacceptable for Xpert Xpress SARS-CoV-2/FLU/RSV  testing. Fact Sheet for Patients: PinkCheek.be Fact Sheet for Healthcare Providers: GravelBags.it This test is not yet approved or cleared by the [place] FDA and  has been authorized for detection and/or diagnosis of SARS-CoV-2 by  FDA under an Emergency Use  Authorization (EUA). This EUA will remain  in effect (meaning this test can be used) for the duration of the  Covid-19 declaration under Section 564(b)(1) of the Act, 21  U.S.C. section 360bbb-3(b)(1), unless the authorization is  terminated or revoked. Performed at [facility], [address] [place],  [place] [address]   MRSA PCR Screening     Status: None   Collection Time: 01/01/20  8:39 PM   Specimen: Nasopharyngeal Swab  Result Value Ref Range Status   MRSA by PCR NEGATIVE NEGATIVE Final    Comment:        The GeneXpert MRSA Assay (FDA approved for NASAL specimens only), is one component of a comprehensive MRSA colonization surveillance program. It is not intended to diagnose MRSA infection nor to guide or monitor treatment for MRSA infections. Performed at [facility], [address] [place], [place] [address]   Blood culture (routine x 2)     Status: None (Preliminary result)   Collection Time: 01/01/20  9:08 PM   Specimen: Right Antecubital; Blood  Result Value Ref Range Status   Specimen Description RIGHT ANTECUBITAL  Final   Special Requests   Final    Blood Culture adequate volume BOTTLES DRAWN AEROBIC AND ANAEROBIC   Culture   Final    NO GROWTH 3 DAYS Performed at [facility], [address], [place], [place] [address]    Report Status PENDING  Incomplete  Blood culture (routine x 2)     Status: None (Preliminary result)   Collection Time: 01/01/20  9:09 PM   Specimen: BLOOD LEFT HAND  Result Value Ref Range Status   Specimen Description BLOOD LEFT HAND  Final   Special Requests   Final    Blood Culture adequate volume BOTTLES DRAWN AEROBIC AND ANAEROBIC   Culture   Final    NO GROWTH 3 DAYS Performed at [facility], [address] [place], [place] [address]    Report Status PENDING  Incomplete    Radiology Reports CT Head Wo Contrast  Result Date: 01/01/2020 CLINICAL DATA:  Fever and altered mental status. EXAM: CT HEAD WITHOUT CONTRAST TECHNIQUE:  Contiguous axial images were obtained from the base of the skull through the vertex without intravenous contrast. COMPARISON:  December 04, 2019 FINDINGS: Brain: There is moderate severity cerebral atrophy with widening of the extra-axial spaces and ventricular dilatation. There are areas of decreased attenuation within the white matter tracts of the supratentorial brain, consistent with microvascular disease changes. Vascular: No hyperdense vessel or unexpected calcification. Skull: Normal. Negative for fracture or focal lesion. Sinuses/Orbits: No acute finding. Other: None. IMPRESSION: No acute intracranial pathology. Electronically Signed   By: Virgina Norfolk M.D.   On: 01/01/2020 22:05   DG CHEST PORT 1 VIEW  Result Date: 01/02/2020 CLINICAL DATA:  Respiratory distress. Fever. COVID positive. EXAM: PORTABLE CHEST 1 VIEW COMPARISON:  Chest radiograph yesterday. FINDINGS: Unchanged elevation of right hemidiaphragm. Increasing patchy opacity at the right lung base. Normal heart size with unchanged mediastinal contours. Aortic tortuosity. No visualized pleural effusion. No pneumothorax. IMPRESSION: Increasing patchy opacity at the right lung base, may be atelectasis or pneumonia. Electronically Signed   By: Keith Rake M.D.   On: 01/02/2020 05:55   DG Chest Port 1 View  Result Date: 01/01/2020 CLINICAL DATA:  Fever, altered mental status EXAM: PORTABLE CHEST 1 VIEW COMPARISON:  12/04/2019 FINDINGS: Low lung volumes reflecting shallow inspiration. No new consolidation or edema. Stable cardiomediastinal contours. Chronic posterior left rib fractures. IMPRESSION: No acute process in the chest. Electronically Signed   By: Macy Mis M.D.   On: 01/01/2020 20:46     CBC Recent Labs  Lab 01/01/20 2006 01/02/20 0152 01/03/20 0348 01/04/20 0333  WBC 2.5* 3.1* 5.4 5.9  HGB 16.1 14.7 13.7 13.0  HCT 51.7 46.6  44.5 41.3  PLT 56* 53* 50* 59*  MCV 89.3 88.8 89.5 88.1  MCH 27.8 28.0 27.6 27.7   MCHC 31.1 31.5 30.8 31.5  RDW 15.1 15.1 15.0 14.9  LYMPHSABS 0.6* 0.5* 0.6* 0.5*  MONOABS 0.2 0.3 0.4 0.2  EOSABS 0.0 0.0 0.0 0.0  BASOSABS 0.0 0.0 0.0 0.0    Chemistries  Recent Labs  Lab 01/01/20 2006 01/02/20 0152 01/03/20 0348 01/04/20 0333  NA 143 141 146* 143  K 4.1 3.6 3.6 3.3*  CL 107 113* 109 111  CO2 25 23 25 23   GLUCOSE 124* 147* 109* 131*  BUN 19 15 20 21   CREATININE 1.10 0.70 0.76 0.71  CALCIUM 9.6 8.4* 9.1 8.9  MG  --  1.8 2.2 2.1  AST 57* 40 25 19  ALT 47* 39 28 21  ALKPHOS 56 47 41 42  BILITOT 0.8 0.8 0.5 0.4   ------------------------------------------------------------------------------------------------------------------ No results for input(s): CHOL, HDL, LDLCALC, TRIG, CHOLHDL, LDLDIRECT in the last 72 hours.  Lab Results  Component Value Date   HGBA1C 5.3 04/24/2018   ------------------------------------------------------------------------------------------------------------------ No results for input(s): TSH, T4TOTAL, T3FREE, THYROIDAB in the last 72 hours.  Invalid input(s): FREET3 ------------------------------------------------------------------------------------------------------------------ Recent Labs    01/03/20 0348 01/04/20 0333  FERRITIN 241 239    Coagulation profile Recent Labs  Lab 01/01/20 2006  INR 1.1    Recent Labs    01/03/20 0348 01/04/20 0333  DDIMER 5.16* 3.15*    Cardiac Enzymes No results for input(s): CKMB, TROPONINI, MYOGLOBIN in the last 168 hours.  Invalid input(s): CK ------------------------------------------------------------------------------------------------------------------    Component Value Date/Time   BNP 31.0 05/06/2017 1612    Rhyan Wolters M.D on 01/04/2020 at 1:54 PM  Go to [email or web address] - for contact info  [facility] - Office  [phone]

## 2020-01-05 LAB — CBC WITH DIFFERENTIAL/PLATELET
Abs Immature Granulocytes: 0.05 10*3/uL (ref 0.00–0.07)
Basophils Absolute: 0 10*3/uL (ref 0.0–0.1)
Basophils Relative: 0 %
Eosinophils Absolute: 0 10*3/uL (ref 0.0–0.5)
Eosinophils Relative: 0 %
HCT: 41.6 % (ref 39.0–52.0)
Hemoglobin: 13 g/dL (ref 13.0–17.0)
Immature Granulocytes: 1 %
Lymphocytes Relative: 9 %
Lymphs Abs: 0.6 10*3/uL — ABNORMAL LOW (ref 0.7–4.0)
MCH: 27.8 pg (ref 26.0–34.0)
MCHC: 31.3 g/dL (ref 30.0–36.0)
MCV: 89.1 fL (ref 80.0–100.0)
Monocytes Absolute: 0.2 10*3/uL (ref 0.1–1.0)
Monocytes Relative: 3 %
Neutro Abs: 5.6 10*3/uL (ref 1.7–7.7)
Neutrophils Relative %: 87 %
Platelets: 68 10*3/uL — ABNORMAL LOW (ref 150–400)
RBC: 4.67 MIL/uL (ref 4.22–5.81)
RDW: 15.1 % (ref 11.5–15.5)
WBC: 6.4 10*3/uL (ref 4.0–10.5)
nRBC: 0.3 % — ABNORMAL HIGH (ref 0.0–0.2)

## 2020-01-05 LAB — GLUCOSE, CAPILLARY
Glucose-Capillary: 129 mg/dL — ABNORMAL HIGH (ref 70–99)
Glucose-Capillary: 109 mg/dL — ABNORMAL HIGH (ref 70–99)
Glucose-Capillary: 84 mg/dL (ref 70–99)
Glucose-Capillary: 194 mg/dL — ABNORMAL HIGH (ref 70–99)

## 2020-01-05 LAB — COMPREHENSIVE METABOLIC PANEL
ALT: 20 U/L (ref 0–44)
AST: 23 U/L (ref 15–41)
Albumin: 2.6 g/dL — ABNORMAL LOW (ref 3.5–5.0)
Alkaline Phosphatase: 53 U/L (ref 38–126)
Anion gap: 13 (ref 5–15)
BUN: 21 mg/dL (ref 8–23)
CO2: 20 mmol/L — ABNORMAL LOW (ref 22–32)
Calcium: 8.9 mg/dL (ref 8.9–10.3)
Chloride: 110 mmol/L (ref 98–111)
Creatinine, Ser: 0.8 mg/dL (ref 0.61–1.24)
GFR calc Af Amer: >60 mL/min (ref >60)
GFR calc non Af Amer: >60 mL/min (ref >60)
Glucose, Bld: 136 mg/dL — ABNORMAL HIGH (ref 70–99)
Potassium: 4.2 mmol/L (ref 3.5–5.1)
Sodium: 143 mmol/L (ref 135–145)
Total Bilirubin: 0.9 mg/dL (ref 0.3–1.2)
Total Protein: 6 g/dL — ABNORMAL LOW (ref 6.5–8.1)

## 2020-01-05 LAB — C-REACTIVE PROTEIN: CRP: 4.8 mg/dL — ABNORMAL HIGH (ref <1.0)

## 2020-01-05 LAB — D-DIMER, QUANTITATIVE: D-Dimer, Quant: 2.27 ug/mL-FEU — ABNORMAL HIGH (ref 0.00–0.50)

## 2020-01-05 LAB — MAGNESIUM: Magnesium: 2.3 mg/dL (ref 1.7–2.4)

## 2020-01-05 LAB — FERRITIN: Ferritin: 218 ng/mL (ref 24–336)

## 2020-01-05 MED ORDER — PREDNISONE 20 MG PO TABS: 40.0000 mg | ORAL_TABLET | Freq: Every day | ORAL | 0 refills | Status: DC

## 2020-01-05 MED ORDER — LOSARTAN POTASSIUM 50 MG PO TABS: 50.0000 mg | ORAL_TABLET | Freq: Every day | ORAL | 3 refills | Status: DC

## 2020-01-05 MED ORDER — AMLODIPINE BESYLATE 5 MG PO TABS: 5.0000 mg | ORAL_TABLET | Freq: Every day | ORAL | 1 refills | Status: DC

## 2020-01-05 MED ORDER — AMLODIPINE BESYLATE 5 MG PO TABS: 5.0000 mg | ORAL_TABLET | Freq: Every day | ORAL | Status: DC

## 2020-01-05 MED ORDER — AMLODIPINE BESYLATE 5 MG PO TABS
10.0000 mg | ORAL_TABLET | Freq: Every day | ORAL | Status: DC
  Administered 2020-01-05 – 2020-01-06 (×2): 10 mg via ORAL
  Filled 2020-01-05 (×2): qty 2

## 2020-01-05 NOTE — Progress Notes (Signed)
Patient Demographics:    Henry Smith, is a 79 y.o. male, DOB - 12-13-1941, [id number]  Admit date - 01/01/2020   Admitting Physician Vianne Bulls, MD  Outpatient Primary MD for the patient is Caprice Renshaw, MD  LOS - 4   Chief Complaint  Patient presents with  . Altered Mental Status        Subjective:    Henry Smith today has no fevers, no emesis,  No chest pain,   -Oral intake is improved -Dyspnea and hypoxia has resolved  -Patient was discharged to [facility] on 0000000 but apparently SNF unable to accept patient until 01/06/2020 -Please see full discharge summary dictated on 01/05/2020   Assessment  & Plan :    Principal Problem:   Acute respiratory failure with hypoxia (HCC) Active Problems:   COVID-19 virus infection   Thrombocytopenia (HCC)   Schizoaffective disorder, bipolar type ([place])   Acute encephalopathy   Sepsis ([place])   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[place])   Hypertensive urgency  Brief Summary: 78 y.o.malewith medical history significant fordementia associated with neurosyphilis, schizoaffective disorder, chronic thrombocytopenia and recurrent leukopenia, hypertension, and history of prostate cancer found to have COVID-19 respiratory infection Patient completed IV remdesivir here on 01/05/2020    A/p 1)Acute Hypoxic respiratory infection secondary to COVID-19--completed 5 days of IV remdesivir  - inflammatory markers noted -Initially received vancomycin and Flagyl -treated empirically with Rocephin and azithromycin for possible concomitant CAP -Hypoxia has resolved -Overall respiratory status has improved significantly -Okay to discharge on po prednisone, no further antibiotics needed  2) acute metabolic encephalopathy secondary to #1 above superimposed on underlying dementia -Serum ammonia is not elevated -Mentation improving  significantly--appears back to baseline  3)Leukopenia and thrombocytopenia--- appears chronic, may worsen with COVID-19 infection -Leukopenia has resolved, platelets trending up -No bleeding noted  4)Dysphagia concerns--speech eval appreciated, reevaluated, doing better, doing well on dysphagia 2 diet with thin liquids   5)HTN-elevated BP noted most likely due to high-dose steroids, propensity towards bradycardia so stop metoprolol, treat empirically with losartan and amlodipine -May use clonidine as needed   6)Dementia--- stable, continue Depakote 500 mg twice daily, Paxil and haloperidol    Disposition----Patient was discharged to [facility] on 0000000 but apparently SNF unable to accept patient until 01/06/2020 -Please see full discharge summary dictated on 01/05/2020  Code Status:Full  Family Communication: legal gaurdian ---[place] Nickel [phone]  [phone])--- left voicemail   Consults :Speech pathologist  Discharge Condition: stable  Follow UP--- PCP at SNF  Diet and Activity recommendation:  As advised  DVT Prophylaxis  :   - SCDs (low Platelets)  Lab Results  Component Value Date   PLT 68 (L) 01/05/2020    Inpatient Medications  Scheduled Meds: . amantadine  100 mg Oral BID  . amLODipine  10 mg Oral Daily  . Chlorhexidine Gluconate Cloth  6 each Topical Daily  . dexamethasone (DECADRON) injection  6 mg Intravenous Q24H  . diclofenac sodium  4 g Topical QID  . divalproex  1,250 mg Oral QHS  . divalproex  500 mg Oral BID  . gabapentin  300 mg Oral TID  . haloperidol  5 mg Oral BID  . insulin aspart  0-5 Units Subcutaneous QHS  . insulin aspart  0-9 Units Subcutaneous TID WC  . latanoprost  1 drop Both Eyes QHS  . losartan  75 mg Oral Daily  . mouth rinse  15 mL Mouth Rinse BID  . PARoxetine  20 mg Oral Daily  . sodium chloride flush  3 mL Intravenous Q12H   Continuous Infusions: . azithromycin 500 mg (01/05/20 0835)  . cefTRIAXone  (ROCEPHIN)  IV Stopped (01/05/20 0913)  . dextrose 5 % and 0.45% NaCl 50 mL/hr at 01/05/20 1400   PRN Meds:.acetaminophen, acetaminophen **OR** [DISCONTINUED] acetaminophen, hydrALAZINE, ondansetron **OR** ondansetron (ZOFRAN) IV    Anti-infectives (From admission, onward)   Start     Dose/Rate Route Frequency Ordered Stop   01/02/20 2200  vancomycin (VANCOREADY) IVPB 1500 mg/300 mL  Status:  Discontinued     1,500 mg 150 mL/hr over 120 Minutes Intravenous Every 24 hours 01/01/20 2134 01/02/20 0817   01/02/20 1600  remdesivir 100 mg in sodium chloride 0.9 % 100 mL IVPB     100 mg 200 mL/hr over 30 Minutes Intravenous Daily 01/01/20 2202 01/05/20 1109   01/02/20 0830  cefTRIAXone (ROCEPHIN) 2 g in sodium chloride 0.9 % 100 mL IVPB     2 g 200 mL/hr over 30 Minutes Intravenous Every 24 hours 01/02/20 0817     01/02/20 0830  azithromycin (ZITHROMAX) 500 mg in sodium chloride 0.9 % 250 mL IVPB     500 mg 250 mL/hr over 60 Minutes Intravenous Every 24 hours 01/02/20 0817     01/02/20 0400  ceFEPIme (MAXIPIME) 2 g in sodium chloride 0.9 % 100 mL IVPB  Status:  Discontinued     2 g 200 mL/hr over 30 Minutes Intravenous Every 8 hours 01/01/20 2134 01/02/20 0817   01/02/20 0015  metroNIDAZOLE (FLAGYL) IVPB 500 mg  Status:  Discontinued     500 mg 100 mL/hr over 60 Minutes Intravenous Every 8 hours 01/01/20 2252 01/02/20 0816   01/01/20 2215  remdesivir 200 mg in sodium chloride 0.9% 250 mL IVPB     200 mg 580 mL/hr over 30 Minutes Intravenous Once 01/01/20 2202 01/02/20 0107   01/01/20 2115  vancomycin (VANCOCIN) IVPB 1000 mg/200 mL premix     1,000 mg 200 mL/hr over 60 Minutes Intravenous  Once 01/01/20 2106 01/02/20 0001   01/01/20 2030  vancomycin (VANCOCIN) IVPB 1000 mg/200 mL premix     1,000 mg 200 mL/hr over 60 Minutes Intravenous  Once 01/01/20 2016 01/01/20 2234   01/01/20 2030  ceFEPIme (MAXIPIME) 2 g in sodium chloride 0.9 % 100 mL IVPB     2 g 200 mL/hr over 30 Minutes  Intravenous  Once 01/01/20 2016 01/01/20 2139        Objective:   Vitals:   01/05/20 1245 01/05/20 1300 01/05/20 1400 01/05/20 1500  BP: (!) 162/84 (!) 171/70 132/75 (!) 136/58  Pulse: (!) 50 (!) 49 64 65  Resp: 15 (!) 21 18 19   Temp:      TempSrc:      SpO2: 96% 94% 95% 96%  Weight:      Height:        Wt Readings from Last 3 Encounters:  01/04/20 96.2 kg  06/12/19 93.9 kg  07/31/18 93.9 kg     Intake/Output Summary (Last 24 hours) at 01/05/2020 1700 Last data filed at 01/05/2020 1400 Gross per 24 hour  Intake 2022.83 ml  Output 1790 ml  Net 232.83 ml    Physical Exam  Gen:- Awake Alert, resting comfortably HEENT:- [place].AT, No  sclera icterus Neck-Supple Neck,No JVD,.  Lungs-diminished in bases, no wheezing  CV- S1, S2 normal, regular  Abd-  +ve B.Sounds, Abd Soft, No tenderness,    Extremity/Skin:- ,No  edema, pedal pulses present  Psych-affect is more appropriate , patient still has cognitive and memory deficits at baseline,  Neuro-generalized weakness, no new focal deficits, no tremors   Data Review:   Micro Results Recent Results (from the past 240 hour(s))  Urine Culture     Status: Abnormal   Collection Time: 01/01/20  8:36 PM   Specimen: Urine, Clean Catch  Result Value Ref Range Status   Specimen Description   Final    URINE, CLEAN CATCH Performed at [facility], [address] [place], [place] [address]    Special Requests   Final    NONE Performed at [facility], [address] [place], [place] [address]    [place], SUGGEST RECOLLECTION (A)  Final   Report Status 01/03/2020 FINAL  Final  Respiratory Panel by RT PCR (Flu A&B, Covid) - Nasopharyngeal Swab     Status: Abnormal   Collection Time: 01/01/20  8:39 PM   Specimen: Nasopharyngeal Swab  Result Value Ref Range Status   SARS Coronavirus 2 by RT PCR POSITIVE (A) NEGATIVE Final    Comment: RESULT CALLED TO, READ BACK BY AND VERIFIED WITH: H CRAWFORD,RN @2134   01/01/20 MKELLY (NOTE) SARS-CoV-2 target nucleic acids are DETECTED. SARS-CoV-2 RNA is generally detectable in upper respiratory specimens  during the acute phase of infection. Positive results are indicative of the presence of the identified virus, but do not rule out bacterial infection or co-infection with other pathogens not detected by the test. Clinical correlation with patient history and other diagnostic information is necessary to determine patient infection status. The expected result is Negative. Fact Sheet for Patients:  PinkCheek.be Fact Sheet for Healthcare Providers: GravelBags.it This test is not yet approved or cleared by the [place] FDA and  has been authorized for detection and/or diagnosis of SARS-CoV-2 by FDA under an Emergency Use Authorization (EUA).  This EUA will remain in effect (meaning this test can be used) f or the duration of  the COVID-19 declaration under Section 564(b)(1) of the Act, 21 U.S.C. section 360bbb-3(b)(1), unless the authorization is terminated or revoked sooner.    Influenza A by PCR NEGATIVE NEGATIVE Final   Influenza B by PCR NEGATIVE NEGATIVE Final    Comment: (NOTE) The Xpert Xpress SARS-CoV-2/FLU/RSV assay is intended as an aid in  the diagnosis of influenza from Nasopharyngeal swab specimens and  should not be used as a sole basis for treatment. Nasal washings and  aspirates are unacceptable for Xpert Xpress SARS-CoV-2/FLU/RSV  testing. Fact Sheet for Patients: PinkCheek.be Fact Sheet for Healthcare Providers: GravelBags.it This test is not yet approved or cleared by the [place] FDA and  has been authorized for detection and/or diagnosis of SARS-CoV-2 by  FDA under an Emergency Use Authorization (EUA). This EUA will remain  in effect (meaning this test can be used) for the duration of the  Covid-19  declaration under Section 564(b)(1) of the Act, 21  U.S.C. section 360bbb-3(b)(1), unless the authorization is  terminated or revoked. Performed at [facility], [address], [place], [place] [address]   MRSA PCR Screening     Status: None   Collection Time: 01/01/20  8:39 PM   Specimen: Nasopharyngeal Swab  Result Value Ref Range Status   MRSA by PCR NEGATIVE NEGATIVE Final  Comment:        The GeneXpert MRSA Assay (FDA approved for NASAL specimens only), is one component of a comprehensive MRSA colonization surveillance program. It is not intended to diagnose MRSA infection nor to guide or monitor treatment for MRSA infections. Performed at [facility], [address], [place], [place] [address]   Blood culture (routine x 2)     Status: None (Preliminary result)   Collection Time: 01/01/20  9:08 PM   Specimen: Right Antecubital; Blood  Result Value Ref Range Status   Specimen Description RIGHT ANTECUBITAL  Final   Special Requests   Final    Blood Culture adequate volume BOTTLES DRAWN AEROBIC AND ANAEROBIC   Culture   Final    NO GROWTH 4 DAYS Performed at [facility], [address], [place], [place] [address]    Report Status PENDING  Incomplete  Blood culture (routine x 2)     Status: None (Preliminary result)   Collection Time: 01/01/20  9:09 PM   Specimen: BLOOD LEFT HAND  Result Value Ref Range Status   Specimen Description BLOOD LEFT HAND  Final   Special Requests   Final    Blood Culture adequate volume BOTTLES DRAWN AEROBIC AND ANAEROBIC   Culture   Final    NO GROWTH 4 DAYS Performed at [facility], [address] [place], [place] [address]    Report Status PENDING  Incomplete    Radiology Reports CT Head Wo Contrast  Result Date: 01/01/2020 CLINICAL DATA:  Fever and altered mental status. EXAM: CT HEAD WITHOUT CONTRAST TECHNIQUE: Contiguous axial images were obtained from the base of the skull through the vertex without intravenous contrast.  COMPARISON:  December 04, 2019 FINDINGS: Brain: There is moderate severity cerebral atrophy with widening of the extra-axial spaces and ventricular dilatation. There are areas of decreased attenuation within the white matter tracts of the supratentorial brain, consistent with microvascular disease changes. Vascular: No hyperdense vessel or unexpected calcification. Skull: Normal. Negative for fracture or focal lesion. Sinuses/Orbits: No acute finding. Other: None. IMPRESSION: No acute intracranial pathology. Electronically Signed   By: Virgina Norfolk M.D.   On: 01/01/2020 22:05   DG CHEST PORT 1 VIEW  Result Date: 01/02/2020 CLINICAL DATA:  Respiratory distress. Fever. COVID positive. EXAM: PORTABLE CHEST 1 VIEW COMPARISON:  Chest radiograph yesterday. FINDINGS: Unchanged elevation of right hemidiaphragm. Increasing patchy opacity at the right lung base. Normal heart size with unchanged mediastinal contours. Aortic tortuosity. No visualized pleural effusion. No pneumothorax. IMPRESSION: Increasing patchy opacity at the right lung base, may be atelectasis or pneumonia. Electronically Signed   By: Keith Rake M.D.   On: 01/02/2020 05:55   DG Chest Port 1 View  Result Date: 01/01/2020 CLINICAL DATA:  Fever, altered mental status EXAM: PORTABLE CHEST 1 VIEW COMPARISON:  12/04/2019 FINDINGS: Low lung volumes reflecting shallow inspiration. No new consolidation or edema. Stable cardiomediastinal contours. Chronic posterior left rib fractures. IMPRESSION: No acute process in the chest. Electronically Signed   By: Macy Mis M.D.   On: 01/01/2020 20:46     CBC Recent Labs  Lab 01/01/20 2006 01/02/20 0152 01/03/20 0348 01/04/20 0333 01/05/20 0317  WBC 2.5* 3.1* 5.4 5.9 6.4  HGB 16.1 14.7 13.7 13.0 13.0  HCT 51.7 46.6 44.5 41.3 41.6  PLT 56* 53* 50* 59* 68*  MCV 89.3 88.8 89.5 88.1 89.1  MCH 27.8 28.0 27.6 27.7 27.8  MCHC 31.1 31.5 30.8 31.5 31.3  RDW 15.1 15.1 15.0 14.9 15.1  LYMPHSABS  0.6* 0.5* 0.6* 0.5* 0.6*  MONOABS 0.2 0.3 0.4 0.2 0.2  EOSABS 0.0 0.0 0.0 0.0 0.0  BASOSABS 0.0 0.0 0.0 0.0 0.0    Chemistries  Recent Labs  Lab 01/01/20 2006 01/02/20 0152 01/03/20 0348 01/04/20 0333 01/05/20 0317  NA 143 141 146* 143 143  K 4.1 3.6 3.6 3.3* 4.2  CL 107 113* 109 111 110  CO2 25 23 25 23  20*  GLUCOSE 124* 147* 109* 131* 136*  BUN 19 15 20 21 21   CREATININE 1.10 0.70 0.76 0.71 0.80  CALCIUM 9.6 8.4* 9.1 8.9 8.9  MG  --  1.8 2.2 2.1 2.3  AST 57* 40 25 19 23   ALT 47* 39 28 21 20   ALKPHOS 56 47 41 42 53  BILITOT 0.8 0.8 0.5 0.4 0.9   ------------------------------------------------------------------------------------------------------------------ No results for input(s): CHOL, HDL, LDLCALC, TRIG, CHOLHDL, LDLDIRECT in the last 72 hours.  Lab Results  Component Value Date   HGBA1C 5.3 04/24/2018   ------------------------------------------------------------------------------------------------------------------ No results for input(s): TSH, T4TOTAL, T3FREE, THYROIDAB in the last 72 hours.  Invalid input(s): FREET3 ------------------------------------------------------------------------------------------------------------------ Recent Labs    01/04/20 0333 01/05/20 0317  FERRITIN 239 218    Coagulation profile Recent Labs  Lab 01/01/20 2006  INR 1.1    Recent Labs    01/04/20 0333 01/05/20 0317  DDIMER 3.15* 2.27*    Cardiac Enzymes No results for input(s): CKMB, TROPONINI, MYOGLOBIN in the last 168 hours.  Invalid input(s): CK ------------------------------------------------------------------------------------------------------------------    Component Value Date/Time   BNP 31.0 05/06/2017 1612   -Oral intake is improved -Dyspnea and hypoxia has resolved  -Patient was discharged to [facility] on 0000000 but apparently SNF unable to accept patient until 01/06/2020 -Please see full discharge summary dictated on 01/05/2020  Roxan Hockey M.D on 01/05/2020 at 5:00 PM  Go to [email or web address] - for contact info  [facility] - Office  [phone]

## 2020-01-05 NOTE — Progress Notes (Signed)
CSW received call from [facility] confirming that patient would be able to discharge to there on 01/06/2020. Debbie in admissions explains that patients room is being deep cleaned.   Number to call for report is [phone]. Patient will be discharging to room C29 around 11am on 01/06/2020.  CSW on call will assist with this discharge.  [place] [facility]  Clinical Social Worker  Ph: [phone]
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
## 2020-01-05 NOTE — Progress Notes (Signed)
11am- CSW attempted to contact [facility] regarding patient disharge. CSW left VM requesting call back  130pm- CSW again attempted to contact [facility] regarding patient discharge. Unable to speak with admissions coordinator at this time. Will continue to follow patient for discharge related needs.   [facility]  Clinical Social Worker  Ph: [phone]

## 2020-01-05 NOTE — Discharge Instructions (Signed)
1)Avoid ibuprofen/Advil/Aleve/Motrin/Goody Powders/Naproxen/BC powders/Meloxicam/Diclofenac/Indomethacin and other Nonsteroidal anti-inflammatory medications as these will make you more likely to bleed and can cause stomach ulcers, can also cause Kidney problems.   2) You are strongly advised to  to isolate/quarantine for at least 21 days from the date of your diagnoses with COVID-19 infection--please always wear a mask if you have to go outside the house\  3)Diet recommendations: Dysphagia 2 (fine chop);Thin/Regular liquid

## 2020-01-05 NOTE — Care Management Important Message (Signed)
Important Message  Patient Details  Name: Henry Smith MRN: [id number] Date of Birth: 1941-01-28   Medicare Important Message Given:  Yes(RN delivered form due to airborne and contact precautions)     Tommy Medal 01/05/2020, 2:28 PM

## 2020-01-06 DIAGNOSIS — U071 COVID-19: Secondary | ICD-10-CM

## 2020-01-06 LAB — CBC WITH DIFFERENTIAL/PLATELET
Abs Immature Granulocytes: 0.06 10*3/uL (ref 0.00–0.07)
Basophils Absolute: 0 10*3/uL (ref 0.0–0.1)
Basophils Relative: 1 %
Eosinophils Absolute: 0 10*3/uL (ref 0.0–0.5)
Eosinophils Relative: 0 %
HCT: 39.1 % (ref 39.0–52.0)
Hemoglobin: 12.4 g/dL — ABNORMAL LOW (ref 13.0–17.0)
Immature Granulocytes: 1 %
Lymphocytes Relative: 10 %
Lymphs Abs: 0.6 10*3/uL — ABNORMAL LOW (ref 0.7–4.0)
MCH: 27.6 pg (ref 26.0–34.0)
MCHC: 31.7 g/dL (ref 30.0–36.0)
MCV: 87.1 fL (ref 80.0–100.0)
Monocytes Absolute: 0.2 10*3/uL (ref 0.1–1.0)
Monocytes Relative: 4 %
Neutro Abs: 5.4 10*3/uL (ref 1.7–7.7)
Neutrophils Relative %: 84 %
Platelets: 87 10*3/uL — ABNORMAL LOW (ref 150–400)
RBC: 4.49 MIL/uL (ref 4.22–5.81)
RDW: 15 % (ref 11.5–15.5)
WBC: 6.4 10*3/uL (ref 4.0–10.5)
nRBC: 0 % (ref 0.0–0.2)

## 2020-01-06 LAB — COMPREHENSIVE METABOLIC PANEL
ALT: 18 U/L (ref 0–44)
AST: 16 U/L (ref 15–41)
Albumin: 2.6 g/dL — ABNORMAL LOW (ref 3.5–5.0)
Alkaline Phosphatase: 42 U/L (ref 38–126)
Anion gap: 10 (ref 5–15)
BUN: 20 mg/dL (ref 8–23)
CO2: 24 mmol/L (ref 22–32)
Calcium: 9.1 mg/dL (ref 8.9–10.3)
Chloride: 107 mmol/L (ref 98–111)
Creatinine, Ser: 0.71 mg/dL (ref 0.61–1.24)
GFR calc Af Amer: >60 mL/min (ref >60)
GFR calc non Af Amer: >60 mL/min (ref >60)
Glucose, Bld: 115 mg/dL — ABNORMAL HIGH (ref 70–99)
Potassium: 4 mmol/L (ref 3.5–5.1)
Sodium: 141 mmol/L (ref 135–145)
Total Bilirubin: 0.4 mg/dL (ref 0.3–1.2)
Total Protein: 6 g/dL — ABNORMAL LOW (ref 6.5–8.1)

## 2020-01-06 LAB — CULTURE, BLOOD (ROUTINE X 2)
Culture: NO GROWTH
Culture: NO GROWTH
Special Requests: ADEQUATE
Special Requests: ADEQUATE

## 2020-01-06 LAB — FERRITIN: Ferritin: 194 ng/mL (ref 24–336)

## 2020-01-06 LAB — GLUCOSE, CAPILLARY: Glucose-Capillary: 94 mg/dL (ref 70–99)

## 2020-01-06 LAB — C-REACTIVE PROTEIN: CRP: 2.7 mg/dL — ABNORMAL HIGH (ref <1.0)

## 2020-01-06 LAB — MAGNESIUM: Magnesium: 2.1 mg/dL (ref 1.7–2.4)

## 2020-01-06 LAB — D-DIMER, QUANTITATIVE: D-Dimer, Quant: 1.22 ug/mL-FEU — ABNORMAL HIGH (ref 0.00–0.50)

## 2020-01-06 NOTE — Progress Notes (Signed)
Patient seen and examined.  Denies any shortness of breath.  Lungs are clear on exam bilaterally.  Vitals are otherwise unremarkable.  He was admitted to the hospital for acute respiratory failure secondary to COVID-19 infection.  Plans are for discharge back to skilled nursing facility on 1/22, per facility was unable to accept him until today.  Patient is to be transferred today to skilled nursing facility.  Please refer to discharge summary done yesterday for for further details during his hospital course.  Patient is otherwise stable for discharge at this time.  No changes in medications.  [employer]

## 2020-01-08 ENCOUNTER — Other Ambulatory Visit: Payer: Self-pay

## 2020-01-08 ENCOUNTER — Observation Stay (HOSPITAL_COMMUNITY)
Admission: EM | Admit: 2020-01-08 | Discharge: 2020-01-09 | Disposition: A | Discharge status: Home / Self Care | Payer: Medicare Other | Attending: Family Medicine | Admitting: Family Medicine

## 2020-01-08 ENCOUNTER — Emergency Department (HOSPITAL_COMMUNITY): Payer: Medicare Other

## 2020-01-08 ENCOUNTER — Encounter (HOSPITAL_COMMUNITY): Payer: Self-pay

## 2020-01-08 DIAGNOSIS — J1282 Pneumonia due to coronavirus disease 2019: Secondary | ICD-10-CM | POA: Diagnosis not present

## 2020-01-08 DIAGNOSIS — F0390 Unspecified dementia without behavioral disturbance: Secondary | ICD-10-CM | POA: Insufficient documentation

## 2020-01-08 DIAGNOSIS — U071 COVID-19: Secondary | ICD-10-CM | POA: Diagnosis present

## 2020-01-08 DIAGNOSIS — R6251 Failure to thrive (child): Secondary | ICD-10-CM

## 2020-01-08 DIAGNOSIS — F250 Schizoaffective disorder, bipolar type: Secondary | ICD-10-CM

## 2020-01-08 DIAGNOSIS — Z7189 Other specified counseling: Secondary | ICD-10-CM

## 2020-01-08 DIAGNOSIS — Z8546 Personal history of malignant neoplasm of prostate: Secondary | ICD-10-CM | POA: Insufficient documentation

## 2020-01-08 DIAGNOSIS — R4189 Other symptoms and signs involving cognitive functions and awareness: Secondary | ICD-10-CM | POA: Diagnosis not present

## 2020-01-08 DIAGNOSIS — R0682 Tachypnea, not elsewhere classified: Secondary | ICD-10-CM

## 2020-01-08 DIAGNOSIS — J9600 Acute respiratory failure, unspecified whether with hypoxia or hypercapnia: Secondary | ICD-10-CM | POA: Diagnosis present

## 2020-01-08 DIAGNOSIS — Z79899 Other long term (current) drug therapy: Secondary | ICD-10-CM | POA: Diagnosis not present

## 2020-01-08 DIAGNOSIS — Z515 Encounter for palliative care: Secondary | ICD-10-CM

## 2020-01-08 DIAGNOSIS — R4182 Altered mental status, unspecified: Secondary | ICD-10-CM | POA: Diagnosis present

## 2020-01-08 DIAGNOSIS — R627 Adult failure to thrive: Secondary | ICD-10-CM | POA: Diagnosis not present

## 2020-01-08 DIAGNOSIS — F17210 Nicotine dependence, cigarettes, uncomplicated: Secondary | ICD-10-CM | POA: Insufficient documentation

## 2020-01-08 DIAGNOSIS — R7989 Other specified abnormal findings of blood chemistry: Secondary | ICD-10-CM | POA: Diagnosis not present

## 2020-01-08 DIAGNOSIS — I509 Heart failure, unspecified: Secondary | ICD-10-CM | POA: Diagnosis not present

## 2020-01-08 HISTORY — DX: Heart failure, unspecified: I50.9

## 2020-01-08 HISTORY — DX: Chronic obstructive pulmonary disease, unspecified: J44.9

## 2020-01-08 HISTORY — DX: Dysphagia, unspecified: R13.10

## 2020-01-08 HISTORY — DX: Metabolic encephalopathy: G93.41

## 2020-01-08 HISTORY — DX: Cognitive communication deficit: R41.841

## 2020-01-08 LAB — COMPREHENSIVE METABOLIC PANEL
ALT: 15 U/L (ref 0–44)
AST: 18 U/L (ref 15–41)
Albumin: 2.6 g/dL — ABNORMAL LOW (ref 3.5–5.0)
Alkaline Phosphatase: 44 U/L (ref 38–126)
Anion gap: 8 (ref 5–15)
BUN: 19 mg/dL (ref 8–23)
CO2: 27 mmol/L (ref 22–32)
Calcium: 8.6 mg/dL — ABNORMAL LOW (ref 8.9–10.3)
Chloride: 102 mmol/L (ref 98–111)
Creatinine, Ser: 0.9 mg/dL (ref 0.61–1.24)
GFR calc Af Amer: >60 mL/min (ref >60)
GFR calc non Af Amer: >60 mL/min (ref >60)
Glucose, Bld: 132 mg/dL — ABNORMAL HIGH (ref 70–99)
Potassium: 3.3 mmol/L — ABNORMAL LOW (ref 3.5–5.1)
Sodium: 137 mmol/L (ref 135–145)
Total Bilirubin: 0.7 mg/dL (ref 0.3–1.2)
Total Protein: 6.1 g/dL — ABNORMAL LOW (ref 6.5–8.1)

## 2020-01-08 LAB — CBC WITH DIFFERENTIAL/PLATELET
Abs Immature Granulocytes: 0.1 10*3/uL — ABNORMAL HIGH (ref 0.00–0.07)
Basophils Absolute: 0 10*3/uL (ref 0.0–0.1)
Basophils Relative: 0 %
Eosinophils Absolute: 0.1 10*3/uL (ref 0.0–0.5)
Eosinophils Relative: 1 %
HCT: 42.1 % (ref 39.0–52.0)
Hemoglobin: 13 g/dL (ref 13.0–17.0)
Immature Granulocytes: 2 %
Lymphocytes Relative: 14 %
Lymphs Abs: 0.8 10*3/uL (ref 0.7–4.0)
MCH: 27.5 pg (ref 26.0–34.0)
MCHC: 30.9 g/dL (ref 30.0–36.0)
MCV: 89.2 fL (ref 80.0–100.0)
Monocytes Absolute: 0.8 10*3/uL (ref 0.1–1.0)
Monocytes Relative: 14 %
Neutro Abs: 3.8 10*3/uL (ref 1.7–7.7)
Neutrophils Relative %: 69 %
Platelets: 138 10*3/uL — ABNORMAL LOW (ref 150–400)
RBC: 4.72 MIL/uL (ref 4.22–5.81)
RDW: 15.1 % (ref 11.5–15.5)
WBC: 5.6 10*3/uL (ref 4.0–10.5)
nRBC: 0.5 % — ABNORMAL HIGH (ref 0.0–0.2)

## 2020-01-08 LAB — URINALYSIS, ROUTINE W REFLEX MICROSCOPIC
Bilirubin Urine: NEGATIVE
Glucose, UA: NEGATIVE mg/dL
Hgb urine dipstick: NEGATIVE
Ketones, ur: NEGATIVE mg/dL
Nitrite: NEGATIVE
Protein, ur: NEGATIVE mg/dL
Specific Gravity, Urine: 1.019 (ref 1.005–1.030)
pH: 6 (ref 5.0–8.0)

## 2020-01-08 LAB — LIPASE, BLOOD: Lipase: 36 U/L (ref 11–51)

## 2020-01-08 LAB — VALPROIC ACID LEVEL: Valproic Acid Lvl: 11 ug/mL — ABNORMAL LOW (ref 50.0–100.0)

## 2020-01-08 LAB — AMMONIA: Ammonia: 15 umol/L (ref 9–35)

## 2020-01-08 LAB — LACTIC ACID, PLASMA: Lactic Acid, Venous: 2.3 mmol/L (ref 0.5–1.9)

## 2020-01-08 MED ORDER — SODIUM CHLORIDE 0.9 % IV BOLUS
500.0000 mL | Freq: Once | INTRAVENOUS | Status: AC
  Administered 2020-01-09: 500 mL via INTRAVENOUS

## 2020-01-08 MED ORDER — ACETAMINOPHEN 325 MG PO TABS: 650.0000 mg | ORAL_TABLET | Freq: Four times a day (QID) | ORAL | Status: DC | PRN

## 2020-01-08 MED ORDER — SODIUM CHLORIDE 0.9 % IV SOLN: INTRAVENOUS | Status: DC

## 2020-01-08 MED ORDER — DEXAMETHASONE SODIUM PHOSPHATE 10 MG/ML IJ SOLN
6.0000 mg | INTRAMUSCULAR | Status: DC
  Administered 2020-01-09: 6 mg via INTRAVENOUS
  Filled 2020-01-08: qty 1

## 2020-01-08 MED ORDER — SODIUM CHLORIDE 0.9 % IV SOLN
1.0000 g | INTRAVENOUS | Status: DC
  Administered 2020-01-09: 1 g via INTRAVENOUS
  Filled 2020-01-08: qty 10

## 2020-01-08 MED ORDER — KCL IN DEXTROSE-NACL 40-5-0.9 MEQ/L-%-% IV SOLN: INTRAVENOUS | Status: AC

## 2020-01-08 MED ORDER — ENOXAPARIN SODIUM 40 MG/0.4ML ~~LOC~~ SOLN
40.0000 mg | SUBCUTANEOUS | Status: DC
  Administered 2020-01-09: 40 mg via SUBCUTANEOUS
  Filled 2020-01-08: qty 0.4

## 2020-01-08 MED ORDER — POTASSIUM CHLORIDE CRYS ER 20 MEQ PO TBCR: 40.0000 meq | EXTENDED_RELEASE_TABLET | Freq: Two times a day (BID) | ORAL | Status: DC

## 2020-01-08 MED ORDER — ONDANSETRON HCL 4 MG PO TABS: 4.0000 mg | ORAL_TABLET | Freq: Four times a day (QID) | ORAL | Status: DC | PRN

## 2020-01-08 MED ORDER — ONDANSETRON HCL 4 MG/2ML IJ SOLN: 4.0000 mg | Freq: Four times a day (QID) | INTRAMUSCULAR | Status: DC | PRN

## 2020-01-08 NOTE — ED Notes (Signed)
Date and time results received: 01/25/211637 (use smartphrase ".now" to insert current time)  Test:Lactic Acid Critical Value:2.3  Name of Provider Notified: Dr Rogene Houston  Orders Received? Or Actions Taken?: NA

## 2020-01-08 NOTE — ED Notes (Signed)
Pt taken to CT.
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
## 2020-01-08 NOTE — ED Triage Notes (Signed)
Pt resident of [facility] and staff reports pt was positive for covid and admitted with pneumonia and sepsis.  Reports was d/c'd back to [facility] on [id number].  Staff says pt has been holding pills in mouth and decreased speech and movement since coming back to facility.  Staff says he held his pills in his mouth Saturday.  Pt had speech therapy evaluation.  NT told ems today that pt did eat a little bit today but small bites and had to drink liquids right after each bite.  Reports pt was verbal to NT.  Staff says pt verbal but not as talkative as he was prior to being admitted.  Reports pt has been doing "continuous eye blinking" and nasal flaring.  Pt able to state his name but speech sounds slurred.  Denies pain.  Pt not able to follow commands when asked to make a fist or move arms or legs.

## 2020-01-09 ENCOUNTER — Encounter (HOSPITAL_COMMUNITY): Payer: Self-pay | Admitting: Internal Medicine

## 2020-01-09 ENCOUNTER — Observation Stay (HOSPITAL_COMMUNITY): Payer: Medicare Other

## 2020-01-09 DIAGNOSIS — Z515 Encounter for palliative care: Secondary | ICD-10-CM

## 2020-01-09 DIAGNOSIS — Z7189 Other specified counseling: Secondary | ICD-10-CM | POA: Diagnosis not present

## 2020-01-09 DIAGNOSIS — F250 Schizoaffective disorder, bipolar type: Secondary | ICD-10-CM | POA: Diagnosis not present

## 2020-01-09 DIAGNOSIS — R4182 Altered mental status, unspecified: Secondary | ICD-10-CM | POA: Diagnosis not present

## 2020-01-09 DIAGNOSIS — U071 COVID-19: Secondary | ICD-10-CM | POA: Diagnosis not present

## 2020-01-09 DIAGNOSIS — R4189 Other symptoms and signs involving cognitive functions and awareness: Secondary | ICD-10-CM | POA: Diagnosis not present

## 2020-01-09 LAB — ABO/RH: ABO/RH(D): B POS

## 2020-01-09 LAB — LACTIC ACID, PLASMA: Lactic Acid, Venous: 1 mmol/L (ref 0.5–1.9)

## 2020-01-09 MED ORDER — DIVALPROEX SODIUM 500 MG PO DR TAB: 500.0000 mg | DELAYED_RELEASE_TABLET | ORAL | 3 refills | Status: DC

## 2020-01-09 MED ORDER — SODIUM CHLORIDE 0.9 % IV SOLN
1.0000 g | Freq: Once | INTRAVENOUS | Status: AC
  Administered 2020-01-09: 1 g via INTRAVENOUS
  Filled 2020-01-09: qty 10

## 2020-01-09 MED ORDER — PREDNISONE 20 MG PO TABS: 40.0000 mg | ORAL_TABLET | Freq: Every day | ORAL | 0 refills | Status: AC

## 2020-01-09 MED ORDER — LAMOTRIGINE 100 MG PO TABS: 100.0000 mg | ORAL_TABLET | Freq: Every day | ORAL | 4 refills | Status: DC

## 2020-01-09 MED ORDER — AMLODIPINE BESYLATE 2.5 MG PO TABS: 2.5000 mg | ORAL_TABLET | Freq: Every day | ORAL | 1 refills | Status: DC

## 2020-01-09 MED ORDER — POTASSIUM CHLORIDE ER 20 MEQ PO TBCR: 20.0000 meq | EXTENDED_RELEASE_TABLET | Freq: Every day | ORAL | 1 refills | Status: DC

## 2020-01-09 MED ORDER — CEPHALEXIN 500 MG PO CAPS: 500.0000 mg | ORAL_CAPSULE | Freq: Three times a day (TID) | ORAL | 0 refills | Status: AC

## 2020-01-09 MED ORDER — SODIUM CHLORIDE 0.9 % IV SOLN: 1.0000 g | Freq: Once | INTRAVENOUS | Status: DC

## 2020-01-09 NOTE — Progress Notes (Signed)
Pt transferred off floor via stretcher with non emergency transport. Alert, verbal, no distress noted.

## 2020-01-09 NOTE — Discharge Instructions (Signed)
1) Depakote level is subtherapeutic----please make sure patient is taking Depakote and Lamictal as prescribed 2)dysphagia 2 diet with thin/regular regular liquids advised 3)Avoid ibuprofen/Advil/Aleve/Motrin/Goody Powders/Naproxen /BC  Powders/ Meloxicam /Diclofenac / Indomethacin and other Nonsteroidal anti-inflammatory medications as these will make you more likely to bleed and can cause stomach ulcers, can also cause Kidney problems. 4) repeat BMP in 1 week 5)You are strongly advised to  to isolate/quarantine for at least 21 days from the date of your diagnoses with COVID-19 infection--please always wear a mask if you have to go outside the house
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
## 2020-01-09 NOTE — Progress Notes (Signed)
Report given to [facility] at [place], made aware of pt status and current discharge instructions. Patient in bed respirations even and unlabored, verbal, no s/s of distress. Awaiting transportation.
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
## 2020-01-09 NOTE — Consult Note (Signed)
Consultation Note Date: 01/09/2020   Patient Name: Henry Smith  DOB: 1941-07-06  MRN: [id number]  Age / Sex: 79 y.o., male  PCP: Caprice Renshaw, MD Referring Physician: Roxan Hockey, MD  Reason for Consultation: Establishing goals of care and Psychosocial/spiritual support  HPI/Patient Profile: 79 y.o. male  with past medical history of recent hospitalization for COVID 19 PNE/ respiratory failure, prostate cancer, cognitive impairment, schizoaffective/bipolar disorder, dementia, CHF and COPD admitted on 01/08/2020 with altered mental status.   Clinical Assessment and Goals of Care: Covid positive patient, virtual visit.  Detailed discussion with bedside nursing staff related to patient condition, needs, goals of care. Conference with attending related to patient condition, needs, disposition.  Call to Legal guardian Henry Smith at [phone], left voice mail message requesting return call. Marland Kitchen   [place], unsure if Henry Smith is a ward of the state.     SUMMARY OF RECOMMENDATIONS   At this point, full scope/full code Would benefit from outpatient palliative  Code Status/Advance Care Planning:  Full code - by default, unable to reach legal guardian  Symptom Management:   Per hospitalist, no additional needs at this time.  Palliative Prophylaxis:   Oral Care and Turn Reposition  Additional Recommendations (Limitations, Scope, Preferences):  Full Scope Treatment  Psycho-social/Spiritual:   Desire for further Chaplaincy support:no  Additional Recommendations: Caregiving  Support/Resources and Education on Hospice  Prognosis:   < 6 months, would not be surprising based on poor functional status to hospitalizations/2 ED visits in the last 6 months, resident of SNF.  Discharge Planning: Return to residential SNF      Primary Diagnoses: Present on  Admission: . AMS (altered mental status) . Cognitive impairment . Schizoaffective disorder, bipolar type ([place]) . Acute respiratory failure due to COVID-19 [facility])   I have reviewed the medical record, interviewed the patient and family, and examined the patient. The following aspects are pertinent.  Past Medical History:  Diagnosis Date  . Altered mental status   . Cancer [facility])    Prostate cancer  . CHF (congestive heart failure) ([place])   . Cognitive communication deficit   . Cognitive impairment   . COPD (chronic obstructive pulmonary disease) ([place])   . 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 Disorientation   . Dysphagia   . Hypercalcemia   . Leukopenia   . Metabolic encephalopathy   . Prostate cancer ([place])   . Schizophrenia ([place])   . Syphilis   . Syphilis   . Thrombocytopenia ([place])    Social History   Socioeconomic History  . Marital status: Divorced    Spouse name: Not on file  . Number of children: 3  . Years of education: Not on file  . Highest education level: Not on file  Occupational History  . Occupation: retired    Comment: railroad unable to provide name  Tobacco Use  . Smoking status: Current Every Day Smoker    Packs/day: 0.50    Years: 60.00  Pack years: 30.00    Types: Cigarettes    Start date: 10/25/1968  . Smokeless tobacco: Current User  Substance and Sexual Activity  . Alcohol use: Not on file  . Drug use: Yes    Types: Marijuana    Comment: occasionally  . Sexual activity: Yes    Partners: Female    Birth control/protection: Condom  Other Topics Concern  . Not on file  Social History Narrative   From a group home   Social Determinants of Health   Financial Resource Strain:   . Difficulty of Paying Living Expenses: Not on file  Food Insecurity:   . Worried About Charity fundraiser in the Last Year: Not on file  . Ran Out of Food in the Last Year: Not on file  Transportation Needs:   . Lack of Transportation (Medical):  Not on file  . Lack of Transportation (Non-Medical): Not on file  Physical Activity:   . Days of Exercise per Week: Not on file  . Minutes of Exercise per Session: Not on file  Stress:   . Feeling of Stress : Not on file  Social Connections:   . Frequency of Communication with Friends and Family: Not on file  . Frequency of Social Gatherings with Friends and Family: Not on file  . Attends Religious Services: Not on file  . Active Member of Clubs or Organizations: Not on file  . Attends Archivist Meetings: Not on file  . Marital Status: Not on file   Family History  Problem Relation Age of Onset  . Diabetes Neg Hx    Scheduled Meds: . dexamethasone (DECADRON) injection  6 mg Intravenous Q24H  . enoxaparin (LOVENOX) injection  40 mg Subcutaneous Q24H   Continuous Infusions: . cefTRIAXone (ROCEPHIN)  IV 1 g (01/09/20 0130)   PRN Meds:.acetaminophen, ondansetron **OR** ondansetron (ZOFRAN) IV Medications Prior to Admission:  Prior to Admission medications   Medication Sig Start Date End Date Taking? Authorizing Provider  acetaminophen (TYLENOL) 500 MG tablet Take 500 mg by mouth every 6 (six) hours as needed for mild pain or moderate pain.   Yes [provider]  amantadine (SYMMETREL) 100 MG capsule Take 1 capsule (100 mg total) by mouth 2 (two) times daily. 01/04/18  Yes McNew, Tyson Babinski, MD  amLODipine (NORVASC) 5 MG tablet Take 1 tablet (5 mg total) by mouth daily. 01/05/20 01/04/21 Yes Emokpae, Courage, MD  cetirizine (ZYRTEC) 10 MG tablet Take 10 mg by mouth daily.   Yes [provider]  diclofenac sodium (VOLTAREN) 1 % GEL Apply 4 g topically 4 (four) times daily. For left knee pain   Yes [provider]  divalproex (DEPAKOTE SPRINKLE) 125 MG capsule Take 500 mg by mouth 2 (two) times daily.    Yes [provider]  divalproex (DEPAKOTE) 250 MG DR tablet Take 250 mg by mouth at bedtime.    Yes [provider]  divalproex  (DEPAKOTE) 500 MG DR tablet Take 1,000 mg by mouth at bedtime.    Yes [provider]  gabapentin (NEURONTIN) 300 MG capsule Take 300 mg by mouth 3 (three) times daily.   Yes [provider]  haloperidol (HALDOL) 5 MG tablet Take 5 mg by mouth 2 (two) times daily.   Yes [provider]  latanoprost (XALATAN) 0.005 % ophthalmic solution Place 1 drop into both eyes at bedtime.   Yes [provider]  losartan (COZAAR) 50 MG tablet Take 1 tablet (50 mg total)  by mouth daily. 01/06/20  Yes Emokpae, Courage, MD  Melatonin 5 MG TABS Take 5 mg by mouth at bedtime.   Yes [provider]  PARoxetine (PAXIL) 20 MG tablet Take 20 mg by mouth daily.   Yes [provider]  predniSONE (DELTASONE) 20 MG tablet Take 2 tablets (40 mg total) by mouth daily with breakfast. 01/05/20  Yes Roxan Hockey, MD   Allergies  Allergen Reactions  . Penicillins Swelling    Has patient had a PCN reaction causing immediate rash, facial/tongue/throat swelling, SOB or lightheadedness with hypotension: Yes Has patient had a PCN reaction causing severe rash involving mucus membranes or skin necrosis: No Has patient had a PCN reaction that required hospitalization: No Has patient had a PCN reaction occurring within the last 10 years: No If all of the above answers are "NO", then may proceed with Cephalosporin use.    Review of Systems  Unable to perform ROS: Other    Physical Exam Vitals and nursing note reviewed.     Vital Signs: BP 138/79 (BP Location: Right Arm)   Pulse 70   Temp 98.9 F (37.2 C) (Oral)   Resp 20   Ht 5\' 11"  (1.803 m)   Wt 95.6 kg   SpO2 96%   BMI 29.40 kg/m  Pain Scale: Faces   Pain Score: 0-No pain   SpO2: SpO2: 96 % O2 Device:SpO2: 96 % O2 Flow Rate: .   IO: Intake/output summary:   Intake/Output Summary (Last 24 hours) at 01/09/2020 1418 Last data filed at 01/09/2020 0900 Gross per 24 hour  Intake 240 ml  Output --  Net 240  ml    LBM: Last BM Date: (UTD) Baseline Weight: Weight: 96 kg Most recent weight: Weight: 95.6 kg     Palliative Assessment/Data:   Flowsheet Rows     Most Recent Value  Intake Tab  Referral Department  Hospitalist  Unit at Time of Referral  Cardiac/Telemetry Unit  Palliative Care Primary Diagnosis  Neurology  Date Notified  01/09/20  Palliative Care Type  New Palliative care  Reason for referral  Clarify Goals of Care  Date of Admission  01/08/20  Date first seen by Palliative Care  01/09/20  # of days Palliative referral response time  0 Day(s)  # of days IP prior to Palliative referral  1  Clinical Assessment  Palliative Performance Scale Score  40%  Pain Max last 24 hours  Not able to report  Pain Min Last 24 hours  Not able to report  Dyspnea Max Last 24 Hours  Not able to report  Dyspnea Min Last 24 hours  Not able to report  Psychosocial & Spiritual Assessment  Palliative Care Outcomes      Time In: 1040 Time Out: 1110 Time Total: 30 minutes  Greater than 50%  of this time was spent counseling and coordinating care related to the above assessment and plan.  Signed by: Drue Novel, NP   Please contact Palliative Medicine Team phone at [phone] for questions and concerns.  For individual provider: See Shea Evans

## 2020-01-10 LAB — URINE CULTURE: Culture: NO GROWTH

## 2020-01-20 ENCOUNTER — Emergency Department (HOSPITAL_COMMUNITY): Payer: Medicare Other

## 2020-01-20 ENCOUNTER — Emergency Department (HOSPITAL_COMMUNITY)
Admission: EM | Admit: 2020-01-20 | Discharge: 2020-01-20 | Disposition: A | Discharge status: Home / Self Care | Payer: Medicare Other | Attending: Emergency Medicine | Admitting: Emergency Medicine

## 2020-01-20 ENCOUNTER — Encounter (HOSPITAL_COMMUNITY): Payer: Self-pay | Admitting: Emergency Medicine

## 2020-01-20 DIAGNOSIS — Z8616 Personal history of COVID-19: Secondary | ICD-10-CM | POA: Insufficient documentation

## 2020-01-20 DIAGNOSIS — F0390 Unspecified dementia without behavioral disturbance: Secondary | ICD-10-CM | POA: Diagnosis not present

## 2020-01-20 DIAGNOSIS — W19XXXA Unspecified fall, initial encounter: Secondary | ICD-10-CM | POA: Diagnosis not present

## 2020-01-20 DIAGNOSIS — F17220 Nicotine dependence, chewing tobacco, uncomplicated: Secondary | ICD-10-CM | POA: Diagnosis not present

## 2020-01-20 DIAGNOSIS — J449 Chronic obstructive pulmonary disease, unspecified: Secondary | ICD-10-CM | POA: Diagnosis not present

## 2020-01-20 DIAGNOSIS — Z79899 Other long term (current) drug therapy: Secondary | ICD-10-CM | POA: Insufficient documentation

## 2020-01-20 DIAGNOSIS — F17210 Nicotine dependence, cigarettes, uncomplicated: Secondary | ICD-10-CM | POA: Diagnosis not present

## 2020-01-20 DIAGNOSIS — Z791 Long term (current) use of non-steroidal anti-inflammatories (NSAID): Secondary | ICD-10-CM | POA: Insufficient documentation

## 2020-01-20 NOTE — ED Triage Notes (Signed)
Pt unwitnessed fall at [facility]. Pt does not talk much.
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
## 2020-01-20 NOTE — Discharge Instructions (Addendum)
Work-up for unwitnessed fall without any acute injury CT head neck negative.  X-rays of pelvis and both hips without any bony injuries.  Patient stable for discharge back to [facility].

## 2020-01-20 NOTE — ED Notes (Signed)
Given meal tray.

## 2020-01-27 ENCOUNTER — Emergency Department (HOSPITAL_COMMUNITY): Payer: Medicare Other

## 2020-01-27 ENCOUNTER — Emergency Department (HOSPITAL_COMMUNITY)
Admission: EM | Admit: 2020-01-27 | Discharge: 2020-01-27 | Disposition: A | Discharge status: Home / Self Care | Payer: Medicare Other | Attending: Emergency Medicine | Admitting: Emergency Medicine

## 2020-01-27 ENCOUNTER — Other Ambulatory Visit: Payer: Self-pay

## 2020-01-27 ENCOUNTER — Encounter (HOSPITAL_COMMUNITY): Payer: Self-pay | Admitting: Emergency Medicine

## 2020-01-27 DIAGNOSIS — F17210 Nicotine dependence, cigarettes, uncomplicated: Secondary | ICD-10-CM | POA: Insufficient documentation

## 2020-01-27 DIAGNOSIS — J449 Chronic obstructive pulmonary disease, unspecified: Secondary | ICD-10-CM | POA: Diagnosis not present

## 2020-01-27 DIAGNOSIS — Z8616 Personal history of COVID-19: Secondary | ICD-10-CM | POA: Diagnosis not present

## 2020-01-27 DIAGNOSIS — I110 Hypertensive heart disease with heart failure: Secondary | ICD-10-CM | POA: Insufficient documentation

## 2020-01-27 DIAGNOSIS — I10 Essential (primary) hypertension: Secondary | ICD-10-CM

## 2020-01-27 DIAGNOSIS — I509 Heart failure, unspecified: Secondary | ICD-10-CM | POA: Diagnosis not present

## 2020-01-27 DIAGNOSIS — Z79899 Other long term (current) drug therapy: Secondary | ICD-10-CM | POA: Insufficient documentation

## 2020-01-27 LAB — BASIC METABOLIC PANEL
Anion gap: 6 (ref 5–15)
BUN: 9 mg/dL (ref 8–23)
CO2: 30 mmol/L (ref 22–32)
Calcium: 9.5 mg/dL (ref 8.9–10.3)
Chloride: 106 mmol/L (ref 98–111)
Creatinine, Ser: 0.86 mg/dL (ref 0.61–1.24)
GFR calc Af Amer: >60 mL/min (ref >60)
GFR calc non Af Amer: >60 mL/min (ref >60)
Glucose, Bld: 99 mg/dL (ref 70–99)
Potassium: 3.7 mmol/L (ref 3.5–5.1)
Sodium: 142 mmol/L (ref 135–145)

## 2020-01-27 LAB — CBC WITH DIFFERENTIAL/PLATELET
Abs Immature Granulocytes: 0.01 10*3/uL (ref 0.00–0.07)
Basophils Absolute: 0 10*3/uL (ref 0.0–0.1)
Basophils Relative: 0 %
Eosinophils Absolute: 0.1 10*3/uL (ref 0.0–0.5)
Eosinophils Relative: 4 %
HCT: 37.7 % — ABNORMAL LOW (ref 39.0–52.0)
Hemoglobin: 11.4 g/dL — ABNORMAL LOW (ref 13.0–17.0)
Immature Granulocytes: 0 %
Lymphocytes Relative: 30 %
Lymphs Abs: 0.9 10*3/uL (ref 0.7–4.0)
MCH: 27.8 pg (ref 26.0–34.0)
MCHC: 30.2 g/dL (ref 30.0–36.0)
MCV: 92 fL (ref 80.0–100.0)
Monocytes Absolute: 0.3 10*3/uL (ref 0.1–1.0)
Monocytes Relative: 10 %
Neutro Abs: 1.8 10*3/uL (ref 1.7–7.7)
Neutrophils Relative %: 56 %
Platelets: 91 10*3/uL — ABNORMAL LOW (ref 150–400)
RBC: 4.1 MIL/uL — ABNORMAL LOW (ref 4.22–5.81)
RDW: 16 % — ABNORMAL HIGH (ref 11.5–15.5)
WBC: 3.2 10*3/uL — ABNORMAL LOW (ref 4.0–10.5)
nRBC: 0 % (ref 0.0–0.2)

## 2020-01-27 LAB — TROPONIN I (HIGH SENSITIVITY): Troponin I (High Sensitivity): 6 ng/L (ref <18)

## 2020-01-27 NOTE — ED Notes (Signed)
Patient unable to sign for discharge instructions.

## 2020-01-27 NOTE — Discharge Instructions (Signed)
Continue home meds. Return for new or worsening symptoms
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
## 2020-01-27 NOTE — ED Triage Notes (Signed)
Patient brought in by [facility] from [facility]. [facility] staff stated that patient was tachycardic with a pulse rate of 132. EMS states patient's heart rate was normal in the 90's per their ekg with a bundle branch block. Patient denies any chest  pain, denies shortness of breath, and is not having any altered mental status at this time that was reported by staff at [facility]. Patient states right arm pain from a fall x 2 weeks ago.
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
## 2020-01-27 NOTE — ED Provider Notes (Signed)
  Face-to-face evaluation   History: Patient lives at his nursing facility, where they found him to be tachycardic so sent him here for evaluation.  Patient has dementia and history of cardiopulmonary disease.  Physical exam: Alert, calm and cooperative.  Heart regular rate and rhythm with 2/6 systolic murmur.  Lungs clear anteriorly.  Abdomen soft nontender.  I am able to move his right arm without pain or tenderness.  No chest wall tenderness or instability.      EKG Interpretation  Date/Time:  Saturday January 27 2020 19:51:11 EST Ventricular Rate:  89 PR Interval:    QRS Duration: 145 QT Interval:  394 QTC Calculation: 480 R Axis:   -54 Text Interpretation: Sinus rhythm RBBB and LAFB Since last tracing rate slower Otherwise no significant change Confirmed by Daleen Bo [phone]) on 01/27/2020 8:31:48 PM        Medical screening examination/treatment/procedure(s) were conducted as a shared visit with non-physician practitioner(s) and myself.  I personally evaluated the patient during the encounter    Daleen Bo, MD 01/27/20 2325

## 2020-05-11 ENCOUNTER — Emergency Department (HOSPITAL_COMMUNITY)
Admission: EM | Admit: 2020-05-11 | Discharge: 2020-05-11 | Disposition: A | Discharge status: Home / Self Care | Payer: Medicare Other | Attending: Emergency Medicine | Admitting: Emergency Medicine

## 2020-05-11 ENCOUNTER — Encounter (HOSPITAL_COMMUNITY): Payer: Self-pay

## 2020-05-11 ENCOUNTER — Other Ambulatory Visit: Payer: Self-pay

## 2020-05-11 DIAGNOSIS — I509 Heart failure, unspecified: Secondary | ICD-10-CM | POA: Insufficient documentation

## 2020-05-11 DIAGNOSIS — R4689 Other symptoms and signs involving appearance and behavior: Secondary | ICD-10-CM | POA: Diagnosis present

## 2020-05-11 DIAGNOSIS — F209 Schizophrenia, unspecified: Secondary | ICD-10-CM | POA: Insufficient documentation

## 2020-05-11 DIAGNOSIS — R45850 Homicidal ideations: Secondary | ICD-10-CM | POA: Insufficient documentation

## 2020-05-11 DIAGNOSIS — F320 Major depressive disorder, single episode, mild: Secondary | ICD-10-CM | POA: Insufficient documentation

## 2020-05-11 DIAGNOSIS — J449 Chronic obstructive pulmonary disease, unspecified: Secondary | ICD-10-CM | POA: Diagnosis not present

## 2020-05-11 DIAGNOSIS — Z79899 Other long term (current) drug therapy: Secondary | ICD-10-CM | POA: Insufficient documentation

## 2020-05-11 DIAGNOSIS — F0391 Unspecified dementia with behavioral disturbance: Secondary | ICD-10-CM | POA: Diagnosis not present

## 2020-05-11 DIAGNOSIS — F17210 Nicotine dependence, cigarettes, uncomplicated: Secondary | ICD-10-CM | POA: Diagnosis not present

## 2020-05-11 DIAGNOSIS — Z1330 Encounter for screening examination for mental health and behavioral disorders, unspecified: Secondary | ICD-10-CM

## 2020-05-11 LAB — CBC
HCT: 44.7 % (ref 39.0–52.0)
Hemoglobin: 13.5 g/dL (ref 13.0–17.0)
MCH: 28 pg (ref 26.0–34.0)
MCHC: 30.2 g/dL (ref 30.0–36.0)
MCV: 92.7 fL (ref 80.0–100.0)
Platelets: 106 10*3/uL — ABNORMAL LOW (ref 150–400)
RBC: 4.82 MIL/uL (ref 4.22–5.81)
RDW: 14.9 % (ref 11.5–15.5)
WBC: 2.3 10*3/uL — ABNORMAL LOW (ref 4.0–10.5)
nRBC: 0 % (ref 0.0–0.2)

## 2020-05-11 LAB — VALPROIC ACID LEVEL: Valproic Acid Lvl: 43 ug/mL — ABNORMAL LOW (ref 50.0–100.0)

## 2020-05-11 LAB — COMPREHENSIVE METABOLIC PANEL
ALT: 29 U/L (ref 0–44)
AST: 28 U/L (ref 15–41)
Albumin: 4 g/dL (ref 3.5–5.0)
Alkaline Phosphatase: 70 U/L (ref 38–126)
Anion gap: 10 (ref 5–15)
BUN: 15 mg/dL (ref 8–23)
CO2: 25 mmol/L (ref 22–32)
Calcium: 9.7 mg/dL (ref 8.9–10.3)
Chloride: 107 mmol/L (ref 98–111)
Creatinine, Ser: 0.93 mg/dL (ref 0.61–1.24)
GFR calc Af Amer: >60 mL/min (ref >60)
GFR calc non Af Amer: >60 mL/min (ref >60)
Glucose, Bld: 97 mg/dL (ref 70–99)
Potassium: 4.2 mmol/L (ref 3.5–5.1)
Sodium: 142 mmol/L (ref 135–145)
Total Bilirubin: 0.4 mg/dL (ref 0.3–1.2)
Total Protein: 7.6 g/dL (ref 6.5–8.1)

## 2020-05-11 LAB — RAPID URINE DRUG SCREEN, HOSP PERFORMED
Amphetamines: NOT DETECTED
Barbiturates: NOT DETECTED
Benzodiazepines: NOT DETECTED
Cocaine: NOT DETECTED
Opiates: NOT DETECTED
Tetrahydrocannabinol: NOT DETECTED

## 2020-05-11 LAB — ETHANOL: Alcohol, Ethyl (B): <10 mg/dL (ref <10)

## 2020-05-11 LAB — SALICYLATE LEVEL: Salicylate Lvl: <7 mg/dL — ABNORMAL LOW (ref 7.0–30.0)

## 2020-05-11 LAB — ACETAMINOPHEN LEVEL: Acetaminophen (Tylenol), Serum: <10 ug/mL — ABNORMAL LOW (ref 10–30)

## 2020-05-11 MED ORDER — DIVALPROEX SODIUM 125 MG PO CSDR
500.0000 mg | DELAYED_RELEASE_CAPSULE | Freq: Once | ORAL | Status: AC
  Administered 2020-05-11: 500 mg via ORAL
  Filled 2020-05-11: qty 4

## 2020-05-11 MED ORDER — HALOPERIDOL 2 MG PO TABS
2.0000 mg | ORAL_TABLET | Freq: Once | ORAL | Status: AC
  Administered 2020-05-11: 2 mg via ORAL
  Filled 2020-05-11: qty 1

## 2020-05-11 MED ORDER — PAROXETINE HCL 20 MG PO TABS
20.0000 mg | ORAL_TABLET | Freq: Every day | ORAL | Status: DC
  Administered 2020-05-11: 20 mg via ORAL
  Filled 2020-05-11 (×3): qty 1

## 2020-05-11 MED ORDER — LAMOTRIGINE 25 MG PO TABS
50.0000 mg | ORAL_TABLET | Freq: Once | ORAL | Status: AC
  Administered 2020-05-11: 50 mg via ORAL
  Filled 2020-05-11: qty 2
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
## 2020-05-11 NOTE — ED Notes (Signed)
Pt has taken his lamictal  Call to Henry Smith who has already sent his other meds towards the ED

## 2020-05-11 NOTE — Discharge Instructions (Addendum)
Henry Smith has been evaluated in the emergency dept. his Depakote level was low and he has been given his Depakote, Haldol, Lamictal, and Paxil.  He is taking his medications without difficulty here.  He has been medically screened and appropriate for return to [facility].  He can follow-up with his primary care provider or return to the ER for any worsening symptoms.
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
## 2020-05-11 NOTE — ED Notes (Signed)
Unknown who ordered TTS  Pt has taken his psych meds and is to return to [facility]

## 2020-05-11 NOTE — ED Notes (Signed)
Per [employer] paperwork  [place] [phone] c; [phone] office;  Fax- [phone]   Niece Seab Rhame Markham [phone]

## 2020-05-11 NOTE — ED Notes (Signed)
Call to [facility] [phone]  They will send transport as soon as they can

## 2020-05-11 NOTE — BH Assessment (Signed)
[facility] Assessment Progress Note    TTS Consult order placed prior to patient being medically cleared.  Has not been seen by MD and labs have not returned.

## 2020-05-11 NOTE — ED Notes (Signed)
Call to [facility] POC: Dedra Skeens [phone]  She is informed pt will return and asks that he be transported by EMS

## 2020-05-11 NOTE — ED Notes (Signed)
Pt sent from [facility] for reported HI  Pt adamantly denies this   He denies hallucinations
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
## 2020-06-03 ENCOUNTER — Encounter (HOSPITAL_COMMUNITY): Payer: Self-pay | Admitting: Emergency Medicine

## 2020-06-03 ENCOUNTER — Other Ambulatory Visit: Payer: Self-pay

## 2020-06-03 ENCOUNTER — Emergency Department (HOSPITAL_COMMUNITY)
Admission: EM | Admit: 2020-06-03 | Discharge: 2020-06-04 | Disposition: A | Discharge status: Home / Self Care | Payer: Medicare Other | Attending: Emergency Medicine | Admitting: Emergency Medicine

## 2020-06-03 ENCOUNTER — Emergency Department (HOSPITAL_COMMUNITY): Payer: Medicare Other

## 2020-06-03 DIAGNOSIS — I110 Hypertensive heart disease with heart failure: Secondary | ICD-10-CM | POA: Diagnosis not present

## 2020-06-03 DIAGNOSIS — J449 Chronic obstructive pulmonary disease, unspecified: Secondary | ICD-10-CM | POA: Diagnosis not present

## 2020-06-03 DIAGNOSIS — F0390 Unspecified dementia without behavioral disturbance: Secondary | ICD-10-CM | POA: Diagnosis not present

## 2020-06-03 DIAGNOSIS — F17210 Nicotine dependence, cigarettes, uncomplicated: Secondary | ICD-10-CM | POA: Diagnosis not present

## 2020-06-03 DIAGNOSIS — K5900 Constipation, unspecified: Secondary | ICD-10-CM | POA: Diagnosis not present

## 2020-06-03 DIAGNOSIS — I509 Heart failure, unspecified: Secondary | ICD-10-CM | POA: Insufficient documentation

## 2020-06-03 DIAGNOSIS — Z79899 Other long term (current) drug therapy: Secondary | ICD-10-CM | POA: Insufficient documentation

## 2020-06-03 LAB — BASIC METABOLIC PANEL
Anion gap: 10 (ref 5–15)
BUN: 13 mg/dL (ref 8–23)
CO2: 27 mmol/L (ref 22–32)
Calcium: 9.3 mg/dL (ref 8.9–10.3)
Chloride: 104 mmol/L (ref 98–111)
Creatinine, Ser: 0.81 mg/dL (ref 0.61–1.24)
GFR calc Af Amer: >60 mL/min (ref >60)
GFR calc non Af Amer: >60 mL/min (ref >60)
Glucose, Bld: 81 mg/dL (ref 70–99)
Potassium: 3.5 mmol/L (ref 3.5–5.1)
Sodium: 141 mmol/L (ref 135–145)

## 2020-06-03 LAB — CBC WITH DIFFERENTIAL/PLATELET
Abs Immature Granulocytes: 0 10*3/uL (ref 0.00–0.07)
Basophils Absolute: 0 10*3/uL (ref 0.0–0.1)
Basophils Relative: 0 %
Eosinophils Absolute: 0.1 10*3/uL (ref 0.0–0.5)
Eosinophils Relative: 2 %
HCT: 44.2 % (ref 39.0–52.0)
Hemoglobin: 13.5 g/dL (ref 13.0–17.0)
Immature Granulocytes: 0 %
Lymphocytes Relative: 31 %
Lymphs Abs: 0.8 10*3/uL (ref 0.7–4.0)
MCH: 27.8 pg (ref 26.0–34.0)
MCHC: 30.5 g/dL (ref 30.0–36.0)
MCV: 90.9 fL (ref 80.0–100.0)
Monocytes Absolute: 0.2 10*3/uL (ref 0.1–1.0)
Monocytes Relative: 8 %
Neutro Abs: 1.5 10*3/uL — ABNORMAL LOW (ref 1.7–7.7)
Neutrophils Relative %: 59 %
Platelets: 126 10*3/uL — ABNORMAL LOW (ref 150–400)
RBC: 4.86 MIL/uL (ref 4.22–5.81)
RDW: 15 % (ref 11.5–15.5)
WBC: 2.5 10*3/uL — ABNORMAL LOW (ref 4.0–10.5)
nRBC: 0 % (ref 0.0–0.2)

## 2020-06-03 MED ORDER — MAGNESIUM CITRATE PO SOLN
1.0000 | Freq: Once | ORAL | Status: AC
  Administered 2020-06-03: 1 via ORAL
  Filled 2020-06-03: qty 296

## 2020-06-03 NOTE — ED Notes (Signed)
Orders for mag citrate here rather than [facility]

## 2020-06-03 NOTE — ED Notes (Signed)
Pt has finished his mag citrate   Declines offer of food saying he only wants fresh vegetables   Currently resting
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
## 2020-06-03 NOTE — ED Notes (Signed)
NAD   Asleep   Awaiting re eval and dispo

## 2020-06-03 NOTE — ED Triage Notes (Addendum)
Per staff, pt was cared for by the DON of [facility] "all day" fpr  Not having a BM in several days   No report by pt of mag citrate, miralax, go-lytly or disimpaction by staff  Here for constipation from [facility]

## 2020-06-04 NOTE — ED Provider Notes (Signed)
Patient was left at change of shift when he presented with constipation.  He was to be evaluated after drinking fluids and if he was able to tolerate fluids he could go home to his group home for treatment of his constipation.  Patient told me that he has drank his bottle magnesium citrate and he also has been drinking ginger ale.  He has no nausea or vomiting.  I feel like he can be discharged at this point.  DG ABD ACUTE 2+V W 1V CHEST  Result Date: 06/03/2020 CLINICAL DATA:  Constipation, no bowel movement for several days. EXAM: DG ABDOMEN ACUTE W/ 1V CHEST COMPARISON:  Chest radiograph 01/08/2020 FINDINGS: Chronic elevation of right hemidiaphragm. Adjacent atelectasis with slight improvement from prior exam. Minor streaky subsegmental atelectasis at the left lung base. Additional airspace opacities on prior exam have resolved. There is peribronchial thickening. Heart is normal in size with unchanged mediastinal contours. No free intra-abdominal air. Prominent air-filled loops of small bowel in the right upper quadrant measure up to 2.5 cm. No significant fluid levels. Moderate volume of stool in the ascending, transverse, and descending colon. Small amount of rectosigmoid stool. No radiopaque calculi. Calcifications in the pelvis are typical of phleboliths. No acute osseous abnormalities are seen. IMPRESSION: 1. Moderate colonic stool burden. Prominent air-filled loops of small bowel in the right upper quadrant are nonspecific, but nonobstructive. Suggest slow transit. 2. Chronic elevation of right hemidiaphragm with adjacent atelectasis, improved from prior exam. Electronically Signed   By: Keith Rake M.D.   On: 06/03/2020 21:42   Diagnoses that have been ruled out:  None  Diagnoses that are still under consideration:  None  Final diagnoses:  Constipation, unspecified constipation type   ED Discharge Orders    None    OTC miralax, dulcolax  Plan discharge  Rolland Porter, MD,  Barbette Or, MD 06/04/20 0040

## 2020-06-04 NOTE — ED Notes (Signed)
To bathroom with return of small amount of hardened stool

## 2020-06-04 NOTE — ED Notes (Signed)
Call to [facility] [phone]  Report to [facility]

## 2020-06-04 NOTE — ED Notes (Signed)
Call to C com for transport to  [facility]  

## 2020-06-04 NOTE — Discharge Instructions (Addendum)
Get miralax and put one dose or 17 g in 8 ounces of water,  take 1 dose every 30 minutes for 2-3 hours or until you  get good results and then once or twice daily to prevent constipation. You can also try dulcolax suppositories to help soften the hard stool.  Recheck if you get abdominal pain or vomiting.

## 2020-06-05 ENCOUNTER — Observation Stay (HOSPITAL_COMMUNITY)
Admission: EM | Admit: 2020-06-05 | Discharge: 2020-06-06 | Disposition: A | Discharge status: Home / Self Care | Payer: Medicare Other | Attending: Family Medicine | Admitting: Family Medicine

## 2020-06-05 ENCOUNTER — Observation Stay (HOSPITAL_COMMUNITY): Payer: Medicare Other

## 2020-06-05 ENCOUNTER — Emergency Department (HOSPITAL_COMMUNITY): Payer: Medicare Other

## 2020-06-05 ENCOUNTER — Other Ambulatory Visit: Payer: Self-pay

## 2020-06-05 ENCOUNTER — Encounter (HOSPITAL_COMMUNITY): Payer: Self-pay

## 2020-06-05 DIAGNOSIS — Z79899 Other long term (current) drug therapy: Secondary | ICD-10-CM | POA: Diagnosis not present

## 2020-06-05 DIAGNOSIS — I110 Hypertensive heart disease with heart failure: Secondary | ICD-10-CM | POA: Diagnosis not present

## 2020-06-05 DIAGNOSIS — R269 Unspecified abnormalities of gait and mobility: Secondary | ICD-10-CM

## 2020-06-05 DIAGNOSIS — R4182 Altered mental status, unspecified: Secondary | ICD-10-CM | POA: Diagnosis not present

## 2020-06-05 DIAGNOSIS — Z7189 Other specified counseling: Secondary | ICD-10-CM

## 2020-06-05 DIAGNOSIS — F0390 Unspecified dementia without behavioral disturbance: Secondary | ICD-10-CM | POA: Insufficient documentation

## 2020-06-05 DIAGNOSIS — I509 Heart failure, unspecified: Secondary | ICD-10-CM | POA: Insufficient documentation

## 2020-06-05 DIAGNOSIS — R1310 Dysphagia, unspecified: Secondary | ICD-10-CM

## 2020-06-05 DIAGNOSIS — R4189 Other symptoms and signs involving cognitive functions and awareness: Secondary | ICD-10-CM | POA: Diagnosis not present

## 2020-06-05 DIAGNOSIS — N3000 Acute cystitis without hematuria: Secondary | ICD-10-CM | POA: Diagnosis not present

## 2020-06-05 DIAGNOSIS — F250 Schizoaffective disorder, bipolar type: Secondary | ICD-10-CM | POA: Diagnosis present

## 2020-06-05 DIAGNOSIS — Z20822 Contact with and (suspected) exposure to covid-19: Secondary | ICD-10-CM | POA: Diagnosis not present

## 2020-06-05 DIAGNOSIS — J449 Chronic obstructive pulmonary disease, unspecified: Secondary | ICD-10-CM | POA: Diagnosis not present

## 2020-06-05 DIAGNOSIS — Z9114 Patient's other noncompliance with medication regimen: Secondary | ICD-10-CM

## 2020-06-05 DIAGNOSIS — R531 Weakness: Secondary | ICD-10-CM

## 2020-06-05 DIAGNOSIS — R569 Unspecified convulsions: Secondary | ICD-10-CM | POA: Diagnosis not present

## 2020-06-05 DIAGNOSIS — F17210 Nicotine dependence, cigarettes, uncomplicated: Secondary | ICD-10-CM | POA: Insufficient documentation

## 2020-06-05 HISTORY — DX: COVID-19: U07.1

## 2020-06-05 LAB — CBC WITH DIFFERENTIAL/PLATELET
Abs Immature Granulocytes: 0.02 10*3/uL (ref 0.00–0.07)
Basophils Absolute: 0 10*3/uL (ref 0.0–0.1)
Basophils Relative: 0 %
Eosinophils Absolute: 0.1 10*3/uL (ref 0.0–0.5)
Eosinophils Relative: 1 %
HCT: 44.5 % (ref 39.0–52.0)
Hemoglobin: 13.8 g/dL (ref 13.0–17.0)
Immature Granulocytes: 1 %
Lymphocytes Relative: 15 %
Lymphs Abs: 0.5 10*3/uL — ABNORMAL LOW (ref 0.7–4.0)
MCH: 28.3 pg (ref 26.0–34.0)
MCHC: 31 g/dL (ref 30.0–36.0)
MCV: 91.4 fL (ref 80.0–100.0)
Monocytes Absolute: 0.3 10*3/uL (ref 0.1–1.0)
Monocytes Relative: 7 %
Neutro Abs: 2.8 10*3/uL (ref 1.7–7.7)
Neutrophils Relative %: 76 %
Platelets: 144 10*3/uL — ABNORMAL LOW (ref 150–400)
RBC: 4.87 MIL/uL (ref 4.22–5.81)
RDW: 14.9 % (ref 11.5–15.5)
WBC: 3.6 10*3/uL — ABNORMAL LOW (ref 4.0–10.5)
nRBC: 0 % (ref 0.0–0.2)

## 2020-06-05 LAB — URINALYSIS, ROUTINE W REFLEX MICROSCOPIC
Bilirubin Urine: NEGATIVE
Glucose, UA: NEGATIVE mg/dL
Hgb urine dipstick: NEGATIVE
Ketones, ur: NEGATIVE mg/dL
Nitrite: NEGATIVE
Protein, ur: 100 mg/dL — AB
Specific Gravity, Urine: 1.016 (ref 1.005–1.030)
WBC, UA: >50 WBC/hpf — ABNORMAL HIGH (ref 0–5)
pH: 5 (ref 5.0–8.0)

## 2020-06-05 LAB — CBG MONITORING, ED: Glucose-Capillary: 139 mg/dL — ABNORMAL HIGH (ref 70–99)

## 2020-06-05 LAB — COMPREHENSIVE METABOLIC PANEL
ALT: 23 U/L (ref 0–44)
AST: 24 U/L (ref 15–41)
Albumin: 3.9 g/dL (ref 3.5–5.0)
Alkaline Phosphatase: 61 U/L (ref 38–126)
Anion gap: 9 (ref 5–15)
BUN: 10 mg/dL (ref 8–23)
CO2: 28 mmol/L (ref 22–32)
Calcium: 9.5 mg/dL (ref 8.9–10.3)
Chloride: 104 mmol/L (ref 98–111)
Creatinine, Ser: 0.89 mg/dL (ref 0.61–1.24)
GFR calc Af Amer: >60 mL/min (ref >60)
GFR calc non Af Amer: >60 mL/min (ref >60)
Glucose, Bld: 61 mg/dL — ABNORMAL LOW (ref 70–99)
Potassium: 3.7 mmol/L (ref 3.5–5.1)
Sodium: 141 mmol/L (ref 135–145)
Total Bilirubin: 0.7 mg/dL (ref 0.3–1.2)
Total Protein: 7.6 g/dL (ref 6.5–8.1)

## 2020-06-05 LAB — RAPID URINE DRUG SCREEN, HOSP PERFORMED
Amphetamines: NOT DETECTED
Barbiturates: NOT DETECTED
Benzodiazepines: NOT DETECTED
Cocaine: NOT DETECTED
Opiates: NOT DETECTED
Tetrahydrocannabinol: NOT DETECTED

## 2020-06-05 LAB — MAGNESIUM: Magnesium: 2.7 mg/dL — ABNORMAL HIGH (ref 1.7–2.4)

## 2020-06-05 LAB — VALPROIC ACID LEVEL: Valproic Acid Lvl: <10 ug/mL — ABNORMAL LOW (ref 50.0–100.0)

## 2020-06-05 LAB — TSH: TSH: 2.317 u[IU]/mL (ref 0.350–4.500)

## 2020-06-05 LAB — SARS CORONAVIRUS 2 BY RT PCR (HOSPITAL ORDER, PERFORMED IN ~~LOC~~ HOSPITAL LAB): SARS Coronavirus 2: NEGATIVE

## 2020-06-05 MED ORDER — ONDANSETRON HCL 4 MG/2ML IJ SOLN: 4.0000 mg | Freq: Four times a day (QID) | INTRAMUSCULAR | Status: DC | PRN

## 2020-06-05 MED ORDER — LABETALOL HCL 5 MG/ML IV SOLN: 10.0000 mg | INTRAVENOUS | Status: DC | PRN

## 2020-06-05 MED ORDER — ACETAMINOPHEN 325 MG PO TABS
650.0000 mg | ORAL_TABLET | Freq: Four times a day (QID) | ORAL | Status: DC | PRN
  Administered 2020-06-06: 650 mg via ORAL
  Filled 2020-06-05: qty 2

## 2020-06-05 MED ORDER — DIVALPROEX SODIUM 125 MG PO CSDR
1000.0000 mg | DELAYED_RELEASE_CAPSULE | Freq: Every day | ORAL | Status: DC
  Administered 2020-06-05: 1000 mg via ORAL
  Filled 2020-06-05 (×2): qty 8

## 2020-06-05 MED ORDER — NICOTINE 21 MG/24HR TD PT24
21.0000 mg | MEDICATED_PATCH | Freq: Once | TRANSDERMAL | Status: AC
  Administered 2020-06-05: 21 mg via TRANSDERMAL
  Filled 2020-06-05: qty 1

## 2020-06-05 MED ORDER — ACETAMINOPHEN 650 MG RE SUPP: 650.0000 mg | Freq: Four times a day (QID) | RECTAL | Status: DC | PRN

## 2020-06-05 MED ORDER — ONDANSETRON HCL 4 MG PO TABS: 4.0000 mg | ORAL_TABLET | Freq: Four times a day (QID) | ORAL | Status: DC | PRN

## 2020-06-05 MED ORDER — DIVALPROEX SODIUM 125 MG PO CSDR
500.0000 mg | DELAYED_RELEASE_CAPSULE | Freq: Every day | ORAL | Status: DC
  Administered 2020-06-06: 500 mg via ORAL
  Filled 2020-06-05 (×3): qty 4

## 2020-06-05 MED ORDER — SODIUM CHLORIDE 0.9 % IV SOLN: INTRAVENOUS | Status: DC

## 2020-06-05 MED ORDER — SODIUM CHLORIDE 0.9 % IV SOLN
1.0000 g | Freq: Once | INTRAVENOUS | Status: AC
  Administered 2020-06-05: 1 g via INTRAVENOUS
  Filled 2020-06-05: qty 10

## 2020-06-05 MED ORDER — ENOXAPARIN SODIUM 40 MG/0.4ML ~~LOC~~ SOLN
40.0000 mg | SUBCUTANEOUS | Status: DC
  Administered 2020-06-05: 40 mg via SUBCUTANEOUS
  Filled 2020-06-05: qty 0.4

## 2020-06-05 MED ORDER — LAMOTRIGINE 100 MG PO TABS
50.0000 mg | ORAL_TABLET | Freq: Two times a day (BID) | ORAL | Status: DC
  Administered 2020-06-05 – 2020-06-06 (×2): 50 mg via ORAL
  Filled 2020-06-05 (×2): qty 1

## 2020-06-05 MED ORDER — ACETAMINOPHEN 500 MG PO TABS: 500.0000 mg | ORAL_TABLET | Freq: Four times a day (QID) | ORAL | Status: DC | PRN

## 2020-06-05 MED ORDER — LORAZEPAM 2 MG/ML IJ SOLN
1.0000 mg | INTRAMUSCULAR | Status: DC | PRN
  Administered 2020-06-05: 1 mg via INTRAVENOUS
  Filled 2020-06-05: qty 1

## 2020-06-05 MED ORDER — DIVALPROEX SODIUM 125 MG PO CSDR
500.0000 mg | DELAYED_RELEASE_CAPSULE | Freq: Once | ORAL | Status: AC
  Administered 2020-06-05: 500 mg via ORAL
  Filled 2020-06-05: qty 4

## 2020-06-05 MED ORDER — LATANOPROST 0.005 % OP SOLN
1.0000 [drp] | Freq: Every day | OPHTHALMIC | Status: DC
  Administered 2020-06-05: 1 [drp] via OPHTHALMIC
  Filled 2020-06-05 (×2): qty 2.5

## 2020-06-05 MED ORDER — SODIUM CHLORIDE 0.9 % IV SOLN
1.0000 g | INTRAVENOUS | Status: DC
  Administered 2020-06-06: 1 g via INTRAVENOUS
  Filled 2020-06-05: qty 10

## 2020-06-05 MED ORDER — LEVETIRACETAM IN NACL 500 MG/100ML IV SOLN
500.0000 mg | Freq: Two times a day (BID) | INTRAVENOUS | Status: DC
  Administered 2020-06-05 – 2020-06-06 (×3): 500 mg via INTRAVENOUS
  Filled 2020-06-05 (×3): qty 100

## 2020-06-05 NOTE — ED Triage Notes (Signed)
Pt resident at [facility].  Reports staff witnessed pt having a seizure.  Reported seizure lasted approx 70min.  Reports pt is on keppra and has refused all of his medications for the past 1 1/2 weeks.  Pt alert but confusded at this time.  Pt oriented to self.

## 2020-06-05 NOTE — ED Notes (Signed)
Pt is aware we need urine sample, urinal at bedside.  
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
## 2020-06-05 NOTE — ED Notes (Signed)
Pt noted to get out of bed and ambulate to the nearest exit and go out the door. Staff was able to redirect pt and is in a recliner next to the nurse station.

## 2020-06-05 NOTE — ED Notes (Signed)
Pt noted to be yelling, he wants to smoke a cigarette. Nurse notified him that we don't smoke here. Pt requested to get out of bed to go outside to smoke. Nurse made him aware we don't have smoking on these premises. Nurse reminded pt of the nicotine patch and when she went to visualize it , pt stated "I took it off, I don't want it. "
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
## 2020-06-06 ENCOUNTER — Encounter (HOSPITAL_COMMUNITY): Payer: Self-pay | Admitting: Family Medicine

## 2020-06-06 DIAGNOSIS — J449 Chronic obstructive pulmonary disease, unspecified: Secondary | ICD-10-CM | POA: Diagnosis not present

## 2020-06-06 DIAGNOSIS — Z515 Encounter for palliative care: Secondary | ICD-10-CM | POA: Diagnosis not present

## 2020-06-06 DIAGNOSIS — Z7189 Other specified counseling: Secondary | ICD-10-CM | POA: Diagnosis not present

## 2020-06-06 DIAGNOSIS — F250 Schizoaffective disorder, bipolar type: Secondary | ICD-10-CM

## 2020-06-06 DIAGNOSIS — R269 Unspecified abnormalities of gait and mobility: Secondary | ICD-10-CM

## 2020-06-06 DIAGNOSIS — R569 Unspecified convulsions: Secondary | ICD-10-CM

## 2020-06-06 DIAGNOSIS — R4189 Other symptoms and signs involving cognitive functions and awareness: Secondary | ICD-10-CM | POA: Diagnosis not present

## 2020-06-06 LAB — CBC WITH DIFFERENTIAL/PLATELET
Abs Immature Granulocytes: 0.01 10*3/uL (ref 0.00–0.07)
Basophils Absolute: 0 10*3/uL (ref 0.0–0.1)
Basophils Relative: 1 %
Eosinophils Absolute: 0 10*3/uL (ref 0.0–0.5)
Eosinophils Relative: 1 %
HCT: 42.8 % (ref 39.0–52.0)
Hemoglobin: 13 g/dL (ref 13.0–17.0)
Immature Granulocytes: 0 %
Lymphocytes Relative: 42 %
Lymphs Abs: 1.6 10*3/uL (ref 0.7–4.0)
MCH: 27.6 pg (ref 26.0–34.0)
MCHC: 30.4 g/dL (ref 30.0–36.0)
MCV: 90.9 fL (ref 80.0–100.0)
Monocytes Absolute: 0.4 10*3/uL (ref 0.1–1.0)
Monocytes Relative: 9 %
Neutro Abs: 1.8 10*3/uL (ref 1.7–7.7)
Neutrophils Relative %: 47 %
Platelets: 124 10*3/uL — ABNORMAL LOW (ref 150–400)
RBC: 4.71 MIL/uL (ref 4.22–5.81)
RDW: 14.9 % (ref 11.5–15.5)
WBC: 3.7 10*3/uL — ABNORMAL LOW (ref 4.0–10.5)
nRBC: 0 % (ref 0.0–0.2)

## 2020-06-06 LAB — COMPREHENSIVE METABOLIC PANEL
ALT: 18 U/L (ref 0–44)
AST: 15 U/L (ref 15–41)
Albumin: 3.3 g/dL — ABNORMAL LOW (ref 3.5–5.0)
Alkaline Phosphatase: 55 U/L (ref 38–126)
Anion gap: 10 (ref 5–15)
BUN: 8 mg/dL (ref 8–23)
CO2: 27 mmol/L (ref 22–32)
Calcium: 9.1 mg/dL (ref 8.9–10.3)
Chloride: 104 mmol/L (ref 98–111)
Creatinine, Ser: 0.91 mg/dL (ref 0.61–1.24)
GFR calc Af Amer: >60 mL/min (ref >60)
GFR calc non Af Amer: >60 mL/min (ref >60)
Glucose, Bld: 94 mg/dL (ref 70–99)
Potassium: 4.2 mmol/L (ref 3.5–5.1)
Sodium: 141 mmol/L (ref 135–145)
Total Bilirubin: 0.5 mg/dL (ref 0.3–1.2)
Total Protein: 6.8 g/dL (ref 6.5–8.1)

## 2020-06-06 LAB — MAGNESIUM: Magnesium: 2.5 mg/dL — ABNORMAL HIGH (ref 1.7–2.4)

## 2020-06-06 MED ORDER — LORAZEPAM 2 MG/ML PO CONC: 1.0000 mg | Freq: Three times a day (TID) | ORAL | 0 refills | Status: AC | PRN

## 2020-06-06 MED ORDER — LEVETIRACETAM 500 MG PO TABS: 500.0000 mg | ORAL_TABLET | Freq: Two times a day (BID) | ORAL | Status: AC

## 2020-06-06 NOTE — Care Management Obs Status (Signed)
[facility] NOTIFICATION   Patient Details  Name: Henry Smith MRN: [id number] Date of Birth: 09/13/1941   Medicare Observation Status Notification Given:  Yes    Tommy Medal 06/06/2020, 2:36 PM

## 2020-06-06 NOTE — Discharge Summary (Signed)
Physician Discharge Summary  Henry Smith MVE:[id number] DOB: 1940/12/24 DOA: 06/05/2020  PCP: Caprice Renshaw, MD  Admit date: 06/05/2020 Discharge date: 06/06/2020  Admitted From:  [facility]  Disposition: [facility] with hospice services  Recommendations for Outpatient Follow-up:  1. [place]    Discharge Condition: HOSPICE   CODE STATUS: DNR, allow natural death   Brief Hospitalization Summary: Please see all hospital notes, images, labs for full details of the hospitalization. HPI: Henry Smith is a 79 y.o. 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  He had been refusing to take his medications for the past 1.5 months according to reports from the facility.  He was supposed to be on depakote for mood stabilization according to records.  He has not taken any of his meds.    He apparently had a witnessed seizure at the facility today that lasted about 10 minutes.  He was noted to be having tonic clonic activity and he was having a postictal state that lasted about 20 minutes.  Fortunately after arriving in ED he settled down and became more alert and oriented to self.  He tells me that he had a seizure years ago as an adult but unable to clarify more than that.    He remains confused with significant dementia.    He was noted to have as expected very low depakote level as he had not been taking it. He was restarted on oral depakote in ED and given IV keppra and admission was requested.    The patient was admitted for observation and had no further seizure activities.  He was initially postictal on arrival however that rapidly improved with supportive care.  He was seen by the inpatient palliative medicine service.  The contacted his caregiver and  his power of attorney regarding healthcare and the decision was made to make the patient DNR, treat treatable things but allowing natural death.  The patient has remained stable with no further seizure activities.  He has tolerated Keppra which we started him on 500 mg twice daily.  He was also restarted on his Depakote for mood stabilization.  He refuses medications at times.  He will continue to do this at the facility like he had been in the past.  Because of this I think that hospice is more appropriate for him and would recommend contacting hospice nurse for any problems first before returning to emergency department.  His dementia remains unchanged.  He is stable to discharge back to SNF with hospice services.  Discharge Diagnoses:  Principal Problem:   Seizure ([place]) Active Problems:   Generalized weakness   Altered mental status   Gait abnormality   Cognitive impairment   Schizoaffective disorder, bipolar type (HCC)   Goals of care, counseling/discussion   COPD (chronic obstructive pulmonary disease) ([place])   Dysphagia   Noncompliance with medication regimen   DNR (do not resuscitate) discussion   Discharge Instructions:  Allergies as of 06/06/2020      Reactions   Penicillins Swelling   Has patient had a PCN reaction causing immediate rash, facial/tongue/throat swelling, SOB or lightheadedness with hypotension: Yes Has patient had a PCN reaction causing severe rash involving mucus membranes or skin necrosis: No Has patient had a PCN reaction that required hospitalization: No Has patient had a PCN reaction occurring within the last 10 years:  No If all of the above answers are "NO", then may proceed with Cephalosporin use.      Medication List    STOP taking these medications   cetirizine 10 MG tablet Commonly known as: ZYRTEC   diphenhydrAMINE 25 MG tablet Commonly known as: BENADRYL   gabapentin 300 MG capsule Commonly known as: NEURONTIN   loratadine 10 MG  tablet Commonly known as: CLARITIN   losartan 50 MG tablet Commonly known as: COZAAR   melatonin 5 MG Tabs   predniSONE 5 MG tablet Commonly known as: DELTASONE     TAKE these medications   acetaminophen 500 MG tablet Commonly known as: TYLENOL Take 500 mg by mouth every 6 (six) hours as needed for mild pain or moderate pain.   amantadine 100 MG capsule Commonly known as: SYMMETREL Take 1 capsule (100 mg total) by mouth 2 (two) times daily.   amLODipine 2.5 MG tablet Commonly known as: NORVASC Take 1 tablet (2.5 mg total) by mouth daily. What changed: how much to take   divalproex 125 MG capsule Commonly known as: DEPAKOTE SPRINKLE Take 1,000 mg by mouth 2 (two) times daily.   haloperidol 2 MG/ML solution Commonly known as: HALDOL Take 2 mg by mouth 3 (three) times daily with meals. "Mix with juice and do not tell patient that juice has medication related to schizophrenia"   lamoTRIgine 25 MG tablet Commonly known as: LAMICTAL Take 50 mg by mouth in the morning and at bedtime.   latanoprost 0.005 % ophthalmic solution Commonly known as: XALATAN Place 1 drop into both eyes at bedtime. For dry eyes   levETIRAcetam 500 MG tablet Commonly known as: Keppra Take 1 tablet (500 mg total) by mouth 2 (two) times daily.   LORazepam 2 MG/ML concentrated solution Commonly known as: ATIVAN Take 0.5 mLs (1 mg total) by mouth every 8 (eight) hours as needed for anxiety or seizure. What changed:   reasons to take this  Another medication with the same name was removed. Continue taking this medication, and follow the directions you see here.   PARoxetine 20 MG tablet Commonly known as: PAXIL Take 20 mg by mouth daily.       Allergies  Allergen Reactions  . Penicillins Swelling    Has patient had a PCN reaction causing immediate rash, facial/tongue/throat swelling, SOB or lightheadedness with hypotension: Yes Has patient had a PCN reaction causing severe rash involving  mucus membranes or skin necrosis: No Has patient had a PCN reaction that required hospitalization: No Has patient had a PCN reaction occurring within the last 10 years: No If all of the above answers are "NO", then may proceed with Cephalosporin use.    Allergies as of 06/06/2020      Reactions   Penicillins Swelling   Has patient had a PCN reaction causing immediate rash, facial/tongue/throat swelling, SOB or lightheadedness with hypotension: Yes Has patient had a PCN reaction causing severe rash involving mucus membranes or skin necrosis: No Has patient had a PCN reaction that required hospitalization: No Has patient had a PCN reaction occurring within the last 10 years: No If all of the above answers are "NO", then may proceed with Cephalosporin use.      Medication List    STOP taking these medications   cetirizine 10 MG tablet Commonly known as: ZYRTEC   diphenhydrAMINE 25 MG tablet Commonly known as: BENADRYL   gabapentin 300 MG capsule Commonly known as: NEURONTIN   loratadine 10 MG tablet Commonly known  as: CLARITIN   losartan 50 MG tablet Commonly known as: COZAAR   melatonin 5 MG Tabs   predniSONE 5 MG tablet Commonly known as: DELTASONE     TAKE these medications   acetaminophen 500 MG tablet Commonly known as: TYLENOL Take 500 mg by mouth every 6 (six) hours as needed for mild pain or moderate pain.   amantadine 100 MG capsule Commonly known as: SYMMETREL Take 1 capsule (100 mg total) by mouth 2 (two) times daily.   amLODipine 2.5 MG tablet Commonly known as: NORVASC Take 1 tablet (2.5 mg total) by mouth daily. What changed: how much to take   divalproex 125 MG capsule Commonly known as: DEPAKOTE SPRINKLE Take 1,000 mg by mouth 2 (two) times daily.   haloperidol 2 MG/ML solution Commonly known as: HALDOL Take 2 mg by mouth 3 (three) times daily with meals. "Mix with juice and do not tell patient that juice has medication related to  schizophrenia"   lamoTRIgine 25 MG tablet Commonly known as: LAMICTAL Take 50 mg by mouth in the morning and at bedtime.   latanoprost 0.005 % ophthalmic solution Commonly known as: XALATAN Place 1 drop into both eyes at bedtime. For dry eyes   levETIRAcetam 500 MG tablet Commonly known as: Keppra Take 1 tablet (500 mg total) by mouth 2 (two) times daily.   LORazepam 2 MG/ML concentrated solution Commonly known as: ATIVAN Take 0.5 mLs (1 mg total) by mouth every 8 (eight) hours as needed for anxiety or seizure. What changed:   reasons to take this  Another medication with the same name was removed. Continue taking this medication, and follow the directions you see here.   PARoxetine 20 MG tablet Commonly known as: PAXIL Take 20 mg by mouth daily.       Procedures/Studies: CT Head Wo Contrast  Result Date: 06/05/2020 CLINICAL DATA:  Seizure, nontraumatic. EXAM: CT HEAD WITHOUT CONTRAST TECHNIQUE: Contiguous axial images were obtained from the base of the skull through the vertex without intravenous contrast. COMPARISON:  Head CT 01/20/2020 FINDINGS: Brain: Stable, mild generalized parenchymal atrophy. Stable, mild ill-defined hypoattenuation within the cerebral white matter which is nonspecific, but consistent with chronic small vessel ischemic disease. There is no acute intracranial hemorrhage. No demarcated cortical infarct. No extra-axial fluid collection. No evidence of intracranial mass. No midline shift. Vascular: No hyperdense vessel. Skull: No calvarial fracture or focal suspicious calvarial lesion. Sinuses/Orbits: Visualized orbits show no acute finding. Small right maxillary sinus mucous retention cyst. Trace ethmoid sinus mucosal thickening. No significant mastoid effusion. IMPRESSION: No CT evidence of acute intracranial abnormality. Stable mild generalized parenchymal atrophy and chronic small vessel ischemic disease. Trace ethmoid sinus mucosal thickening. Small right  maxillary sinus mucous retention cyst. Electronically Signed   By: Kellie Simmering DO   On: 06/05/2020 11:14   MR BRAIN WO CONTRAST  Result Date: 06/05/2020 CLINICAL DATA:  Seizure. EXAM: MRI HEAD WITHOUT CONTRAST TECHNIQUE: Multiplanar, multiecho pulse sequences of the brain and surrounding structures were obtained without intravenous contrast. COMPARISON:  Head CT 06/05/2020 and MRI 01/09/2020 FINDINGS: The study is severely motion degraded despite repeat imaging attempts. Additionally, the examination was terminated prior to completion. Axial and coronal diffusion and sagittal T1 sequences were obtained. No large acute infarct is identified although the degree of motion limits assessment for smaller infarcts. There is no significant midline shift or other gross intracranial mass effect. IMPRESSION: Severely motion degraded, incomplete examination. No large acute infarct. Electronically Signed   By: Zenia Resides  Jeralyn Ruths M.D.   On: 06/05/2020 18:26   DG Chest Portable 1 View  Result Date: 06/05/2020 CLINICAL DATA:  Seizure. EXAM: PORTABLE CHEST 1 VIEW COMPARISON:  January 08, 2020. FINDINGS: Stable cardiomediastinal silhouette. No pneumothorax is noted. Left lung is clear. Elevated right hemidiaphragm is noted with minimal right basilar subsegmental atelectasis or scarring. Bony thorax is unremarkable. IMPRESSION: Elevated right hemidiaphragm with minimal right basilar subsegmental atelectasis or scarring. Electronically Signed   By: Marijo Conception M.D.   On: 06/05/2020 10:53   DG ABD ACUTE 2+V W 1V CHEST  Result Date: 06/03/2020 CLINICAL DATA:  Constipation, no bowel movement for several days. EXAM: DG ABDOMEN ACUTE W/ 1V CHEST COMPARISON:  Chest radiograph 01/08/2020 FINDINGS: Chronic elevation of right hemidiaphragm. Adjacent atelectasis with slight improvement from prior exam. Minor streaky subsegmental atelectasis at the left lung base. Additional airspace opacities on prior exam have resolved. There is  peribronchial thickening. Heart is normal in size with unchanged mediastinal contours. No free intra-abdominal air. Prominent air-filled loops of small bowel in the right upper quadrant measure up to 2.5 cm. No significant fluid levels. Moderate volume of stool in the ascending, transverse, and descending colon. Small amount of rectosigmoid stool. No radiopaque calculi. Calcifications in the pelvis are typical of phleboliths. No acute osseous abnormalities are seen. IMPRESSION: 1. Moderate colonic stool burden. Prominent air-filled loops of small bowel in the right upper quadrant are nonspecific, but nonobstructive. Suggest slow transit. 2. Chronic elevation of right hemidiaphragm with adjacent atelectasis, improved from prior exam. Electronically Signed   By: Keith Rake M.D.   On: 06/03/2020 21:42      Subjective: No further seizure activity, remains pleasantly confused.    Discharge Exam: Vitals:   06/06/20 0822 06/06/20 1225  BP: 130/66 (!) 144/74  Pulse: 81 82  Resp: 18 16  Temp: 97.6 F (36.4 C) 98 F (36.7 C)  SpO2: 99% 98%   Vitals:   06/06/20 0011 06/06/20 0426 06/06/20 0822 06/06/20 1225  BP: 130/90 139/84 130/66 (!) 144/74  Pulse:  64 81 82  Resp: 15 16 18 16   Temp: 98.6 F (37 C)  97.6 F (36.4 C) 98 F (36.7 C)  TempSrc: Oral  Oral Oral  SpO2: 100% 100% 99% 98%  Weight:      Height:       General: Pt is alert, awake, not in acute distress, confused with dementia.  Cardiovascular: normal S1/S2 +, no rubs, no gallops Respiratory: CTA bilaterally, no wheezing, no rhonchi Abdominal: Soft, NT, ND, bowel sounds + Extremities: no edema, no cyanosis Neurological: nonfocal exam.    The results of significant diagnostics from this hospitalization (including imaging, microbiology, ancillary and laboratory) are listed below for reference.     Microbiology: Recent Results (from the past 240 hour(s))  SARS Coronavirus 2 by RT PCR (hospital order, performed in [facility] hospital lab) Nasopharyngeal Nasopharyngeal Swab     Status: None   Collection Time: 06/05/20 12:13 PM   Specimen: Nasopharyngeal Swab  Result Value Ref Range Status   SARS Coronavirus 2 NEGATIVE NEGATIVE Final    Comment: (NOTE) SARS-CoV-2 target nucleic acids are NOT DETECTED.  The SARS-CoV-2 RNA is generally detectable in upper and lower respiratory specimens during the acute phase of infection. The lowest concentration of SARS-CoV-2 viral copies this assay can detect is 250 copies / mL. A negative result does not preclude SARS-CoV-2 infection and should not be used as the sole basis for treatment or other patient management decisions.  A negative result may occur with improper specimen collection / handling, submission of specimen other than nasopharyngeal swab, presence of viral mutation(s) within the areas targeted by this assay, and inadequate number of viral copies (<250 copies / mL). A negative result must be combined with clinical observations, patient history, and epidemiological information.  Fact Sheet for Patients:   StrictlyIdeas.no  Fact Sheet for Healthcare Providers: BankingDealers.co.za  This test is not yet approved or  cleared by the [place] FDA and has been authorized for detection and/or diagnosis of SARS-CoV-2 by FDA under an Emergency Use Authorization (EUA).  This EUA will remain in effect (meaning this test can be used) for the duration of the COVID-19 declaration under Section 564(b)(1) of the Act, 21 U.S.C. section 360bbb-3(b)(1), unless the authorization is terminated or revoked sooner.  Performed at [facility], [address], [place], [place] [address]      Labs: BNP (last 3 results) No results for input(s): BNP in the last 8760 hours. Basic Metabolic Panel: Recent Labs  Lab 06/03/20 2009 06/05/20 1036 06/06/20 0451  NA 141 141 141  K 3.5 3.7 4.2  CL 104 104 104  CO2 27 28 27    GLUCOSE 81 61* 94  BUN 13 10 8   CREATININE 0.81 0.89 0.91  CALCIUM 9.3 9.5 9.1  MG  --  2.7* 2.5*   Liver Function Tests: Recent Labs  Lab 06/05/20 1036 06/06/20 0451  AST 24 15  ALT 23 18  ALKPHOS 61 55  BILITOT 0.7 0.5  PROT 7.6 6.8  ALBUMIN 3.9 3.3*   No results for input(s): LIPASE, AMYLASE in the last 168 hours. No results for input(s): AMMONIA in the last 168 hours. CBC: Recent Labs  Lab 06/03/20 2007 06/05/20 1036 06/06/20 0451  WBC 2.5* 3.6* 3.7*  NEUTROABS 1.5* 2.8 1.8  HGB 13.5 13.8 13.0  HCT 44.2 44.5 42.8  MCV 90.9 91.4 90.9  PLT 126* 144* 124*   Cardiac Enzymes: No results for input(s): CKTOTAL, CKMB, CKMBINDEX, TROPONINI in the last 168 hours. BNP: Invalid input(s): POCBNP CBG: Recent Labs  Lab 06/05/20 0929  GLUCAP 139*   D-Dimer No results for input(s): DDIMER in the last 72 hours. Hgb A1c No results for input(s): HGBA1C in the last 72 hours. Lipid Profile No results for input(s): CHOL, HDL, LDLCALC, TRIG, CHOLHDL, LDLDIRECT in the last 72 hours. Thyroid function studies Recent Labs    06/05/20 1036  TSH 2.317   Anemia work up No results for input(s): VITAMINB12, FOLATE, FERRITIN, TIBC, IRON, RETICCTPCT in the last 72 hours. Urinalysis    Component Value Date/Time   COLORURINE YELLOW 06/05/2020 1137   APPEARANCEUR CLOUDY (A) 06/05/2020 1137   LABSPEC 1.016 06/05/2020 1137   PHURINE 5.0 06/05/2020 1137   GLUCOSEU NEGATIVE 06/05/2020 1137   HGBUR NEGATIVE 06/05/2020 1137   BILIRUBINUR NEGATIVE 06/05/2020 1137   KETONESUR NEGATIVE 06/05/2020 1137   PROTEINUR 100 (A) 06/05/2020 1137   NITRITE NEGATIVE 06/05/2020 1137   LEUKOCYTESUR MODERATE (A) 06/05/2020 1137   Sepsis Labs Invalid input(s): PROCALCITONIN,  WBC,  LACTICIDVEN Microbiology Recent Results (from the past 240 hour(s))  SARS Coronavirus 2 by RT PCR (hospital order, performed in [facility] hospital lab) Nasopharyngeal Nasopharyngeal Swab     Status: None    Collection Time: 06/05/20 12:13 PM   Specimen: Nasopharyngeal Swab  Result Value Ref Range Status   SARS Coronavirus 2 NEGATIVE NEGATIVE Final    Comment: (NOTE) SARS-CoV-2 target nucleic acids are NOT DETECTED.  The SARS-CoV-2  RNA is generally detectable in upper and lower respiratory specimens during the acute phase of infection. The lowest concentration of SARS-CoV-2 viral copies this assay can detect is 250 copies / mL. A negative result does not preclude SARS-CoV-2 infection and should not be used as the sole basis for treatment or other patient management decisions.  A negative result may occur with improper specimen collection / handling, submission of specimen other than nasopharyngeal swab, presence of viral mutation(s) within the areas targeted by this assay, and inadequate number of viral copies (<250 copies / mL). A negative result must be combined with clinical observations, patient history, and epidemiological information.  Fact Sheet for Patients:   StrictlyIdeas.no  Fact Sheet for Healthcare Providers: BankingDealers.co.za  This test is not yet approved or  cleared by the [place] FDA and has been authorized for detection and/or diagnosis of SARS-CoV-2 by FDA under an Emergency Use Authorization (EUA).  This EUA will remain in effect (meaning this test can be used) for the duration of the COVID-19 declaration under Section 564(b)(1) of the Act, 21 U.S.C. section 360bbb-3(b)(1), unless the authorization is terminated or revoked sooner.  Performed at [facility], [address], [place], [place] [address]    Time coordinating discharge:   SIGNED:  Irwin Brakeman, MD  [facility] 06/06/2020, 2:27 PM How to contact the [facility] Attending or Consulting provider [place] or covering provider during after hours [place], for this patient?  1. Check the care team in [facility] and look for a) attending/consulting [facility]  provider listed and b) the [facility] team listed 2. Log into [email or web address] and use [place]'s universal password to access. If you do not have the password, please contact the hospital operator. 3. Locate the [facility] provider you are looking for under [facility] and page to a number that you can be directly reached. 4. If you still have difficulty reaching the provider, please page the [facility] (Director on Call) for the Hospitalists listed on amion for assistance.

## 2020-06-06 NOTE — Plan of Care (Signed)
Palliative:  Chart reviewed.  Seen by palliative medicine team earlier in the year.  [name] is a [facility].  He has a heavy chronic disease burden. Call to legal guardian, Henry Smith at [phone].  Left somewhat detailed voicemail message as Henry Smith identified herself in the recording.  Requesting return phone call, both team and personal cell phone numbers provided.  We talked about returning to residential SNF/LTC with the benefits of hospice as Henry Smith has declined to take his medications, and has a heavy chronic disease burden.  No charge Henry Axe, NP Palliative medicine team Team phone [phone] Greater than 50% of this time was spent counseling and coordinating Henry Smith care related to the above assessment and plan.

## 2020-06-06 NOTE — NC FL2 (Signed)
[place] LEVEL OF CARE SCREENING TOOL     IDENTIFICATION  Patient Name: Henry Smith Birthdate: Jun 18, 1941 Sex: male Admission Date (Current Location): 06/05/2020  [place] and [place] Number:  [place] [id number] [place] and Address:  [facility] [address], [place]      Provider Number: [id number]  Attending Physician Name and Address:  Murlean Iba, MD  Relative Name and Phone Number:  Jamelle Rushing (legal guardian) Ph: [phone]    Current Level of Care: Hospital Recommended Level of Care: Nursing Facility Prior Approval Number:    Date Approved/Denied:   PASRR Number:    Discharge Plan: Other (Comment) [facility])    Current Diagnoses: Patient Active Problem List   Diagnosis Date Noted  . Seizure ([place]) 06/05/2020  . COPD (chronic obstructive pulmonary disease) ([place])   . Dysphagia   . Noncompliance with medication regimen   . Goals of care, counseling/discussion   . Palliative care by specialist   . AMS (altered mental status) 01/08/2020  . COVID-19 virus infection 01/02/2020  . Sepsis ([place]) 01/01/2020  . 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 Hypertensive urgency   . Acute respiratory failure with hypoxia ([place])   . Acute respiratory failure due to COVID-19 ([place])   . Proteus mirabilis infection 04/27/2018  . Bacteremia 04/27/2018  . Sepsis due to Gram negative bacteria ([place]) 04/27/2018  . UTI (urinary tract infection) 04/24/2018  . Acute encephalopathy 04/24/2018  . Sepsis secondary to UTI ([place]) 04/23/2018  . Tobacco use disorder 01/02/2018  . Cognitive impairment 12/29/2017  . Schizoaffective disorder, bipolar type ([place]) 12/29/2017  . Gait abnormality 05/06/2017  . Allergic drug rash 03/03/2017  . Altered mental status 02/26/2017  . Syphilis 01/31/2017  . Disorientation   . Generalized weakness 01/30/2017  . Altered mental status, unspecified 01/28/2017  . Hypercalcemia  11/01/2016  . Prostate cancer ([place]) 10/30/2016  . Leukopenia 10/30/2016  . Thrombocytopenia ([place]) 10/30/2016    Orientation RESPIRATION BLADDER Height & Weight     Self, Place, Situation  O2 (2L/min) Continent Weight: 199 lb 4.7 oz (90.4 kg) Height:  5\' 11"  (180.3 cm)  BEHAVIORAL SYMPTOMS/MOOD NEUROLOGICAL BOWEL NUTRITION STATUS    Convulsions/Seizures Continent Diet (Heart healthy)  AMBULATORY STATUS COMMUNICATION OF NEEDS Skin   Supervision Verbally Normal                       Personal Care Assistance Level of Assistance  Bathing, Feeding, Dressing Bathing Assistance: Limited assistance Feeding assistance: Independent Dressing Assistance: Limited assistance     Functional Limitations Info  Sight, Hearing, Speech Sight Info: Impaired Hearing Info: Adequate Speech Info: Adequate    SPECIAL CARE FACTORS FREQUENCY                       Contractures Contractures Info: Not present    Additional Factors Info  Code Status, Allergies, Psychotropic Code Status Info: Full Allergies Info: Penicillins Psychotropic Info: Lamictal (lamotrigine); Depakote sprinkle; Ativan (lorazepam)         Current Medications (06/06/2020):  This is the current hospital active medication list Current Facility-Administered Medications  Medication Dose Route Frequency Provider Last Rate Last Admin  . 0.9 %  sodium chloride infusion   Intravenous Continuous Wynetta Emery, Clanford L, MD 35 mL/hr at 06/05/20 2003 New Bag at 06/05/20 2003  . acetaminophen (TYLENOL) tablet 650 mg  650 mg Oral Q6H PRN Johnson, Clanford L, MD   650 mg at  06/06/20 1025   Or  . acetaminophen (TYLENOL) suppository 650 mg  650 mg Rectal Q6H PRN Johnson, Clanford L, MD      . cefTRIAXone (ROCEPHIN) 1 g in sodium chloride 0.9 % 100 mL IVPB  1 g Intravenous Q24H Johnson, Clanford L, MD      . divalproex (DEPAKOTE SPRINKLE) capsule 1,000 mg  1,000 mg Oral QHS Johnson, Clanford L, MD   1,000 mg at 06/05/20 2134  .  divalproex (DEPAKOTE SPRINKLE) capsule 500 mg  500 mg Oral Daily Johnson, Clanford L, MD   500 mg at 06/06/20 0948  . enoxaparin (LOVENOX) injection 40 mg  40 mg Subcutaneous Q24H Johnson, Clanford L, MD   40 mg at 06/05/20 2004  . labetalol (NORMODYNE) injection 10 mg  10 mg Intravenous Q2H PRN Johnson, Clanford L, MD      . lamoTRIgine (LAMICTAL) tablet 50 mg  50 mg Oral BID Wynetta Emery, Clanford L, MD   50 mg at 06/06/20 0948  . latanoprost (XALATAN) 0.005 % ophthalmic solution 1 drop  1 drop Both Eyes QHS Johnson, Clanford L, MD   1 drop at 06/05/20 2135  . levETIRAcetam (KEPPRA) IVPB 500 mg/100 mL premix  500 mg Intravenous Q12H Johnson, Clanford L, MD 400 mL/hr at 06/06/20 1215 500 mg at 06/06/20 1215  . LORazepam (ATIVAN) injection 1 mg  1 mg Intravenous Q4H PRN Wynetta Emery, Clanford L, MD   1 mg at 06/05/20 2007  . ondansetron (ZOFRAN) tablet 4 mg  4 mg Oral Q6H PRN Johnson, Clanford L, MD       Or  . ondansetron (ZOFRAN) injection 4 mg  4 mg Intravenous Q6H PRN Murlean Iba, MD         Discharge Medications: Please see discharge summary for a list of discharge medications.  Relevant Imaging Results:  Relevant Lab Results:   Additional Information SSN#: [id number]  Sherie Don, LCSW

## 2020-06-06 NOTE — TOC Transition Note (Addendum)
Transition of Care [facility]) - CM/SW Discharge Note  Patient Details  Name: Glendel Jaggers MRN: [id number] Date of Birth: 1940-12-16  [facility]) CM/SW Contact:  Sherie Don, LCSW Phone Number: 06/06/2020, 2:27 PM  Clinical Narrative: Patient is a 79 year old male under observation for seizure. TOC notified by palliative patient will need hospice services to be provided at [facility]. CSW notified by hospitalist patient is medically ready to return to [place]. CSW spoke with [place] at [place]. Debbie reported patient can return today and the facility social worker will refer the patient to [facility]. CSW attempted to call patient's legal guardian's supervisor, Loa Socks, but was unable to reach her. CSW called [facility] [phone], who provides patient's guardianship) and spoke with Elenora Gamma. CSW updated Ms. Nada Boozer regarding palliative's recommendations for hospice care. Ms. Nada Boozer agreeable to referral to hospice being made by [facility].  FL2 completed and faxed to [facility]. Medical Necessity form completed and given to RN. [facility] called for transportation. CSW left voicemail for Jackelyn Poling that patient will return as well as additional contact information for [facility]. Discharge summary and discharge orders faxed to [place]. TOC signing off.  Final next level of care: Long Term Nursing Home Barriers to Discharge: Barriers Resolved  Patient Goals and CMS Choice Patient states their goals for this hospitalization and ongoing recovery are:: Return to [facility].gov Compare Post Acute Care list provided to:: Patient Choice offered to / list presented to : Patient  Discharge Placement Patient to be transferred to facility by: [facility]  Discharge Plan and Services        DME Arranged: N/A DME Agency: NA HH Arranged: NA [place] Agency: NA  Readmission Risk Interventions No flowsheet data found.
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
## 2020-06-19 ENCOUNTER — Emergency Department (HOSPITAL_COMMUNITY)
Admission: EM | Admit: 2020-06-19 | Discharge: 2020-06-19 | Disposition: A | Discharge status: Home / Self Care | Payer: Medicare Other | Attending: Emergency Medicine | Admitting: Emergency Medicine

## 2020-06-19 ENCOUNTER — Encounter (HOSPITAL_COMMUNITY): Payer: Self-pay | Admitting: Emergency Medicine

## 2020-06-19 ENCOUNTER — Other Ambulatory Visit: Payer: Self-pay

## 2020-06-19 DIAGNOSIS — Z0279 Encounter for issue of other medical certificate: Secondary | ICD-10-CM | POA: Diagnosis present

## 2020-06-19 DIAGNOSIS — R4689 Other symptoms and signs involving appearance and behavior: Secondary | ICD-10-CM | POA: Insufficient documentation

## 2020-06-19 DIAGNOSIS — Z8546 Personal history of malignant neoplasm of prostate: Secondary | ICD-10-CM | POA: Diagnosis not present

## 2020-06-19 DIAGNOSIS — N390 Urinary tract infection, site not specified: Secondary | ICD-10-CM | POA: Insufficient documentation

## 2020-06-19 DIAGNOSIS — Z79899 Other long term (current) drug therapy: Secondary | ICD-10-CM | POA: Diagnosis not present

## 2020-06-19 DIAGNOSIS — I509 Heart failure, unspecified: Secondary | ICD-10-CM | POA: Diagnosis not present

## 2020-06-19 DIAGNOSIS — J449 Chronic obstructive pulmonary disease, unspecified: Secondary | ICD-10-CM | POA: Insufficient documentation

## 2020-06-19 DIAGNOSIS — F209 Schizophrenia, unspecified: Secondary | ICD-10-CM

## 2020-06-19 LAB — URINALYSIS, ROUTINE W REFLEX MICROSCOPIC
Bilirubin Urine: NEGATIVE
Glucose, UA: NEGATIVE mg/dL
Hgb urine dipstick: NEGATIVE
Ketones, ur: NEGATIVE mg/dL
Nitrite: POSITIVE — AB
Protein, ur: NEGATIVE mg/dL
Specific Gravity, Urine: 1.021 (ref 1.005–1.030)
pH: 5 (ref 5.0–8.0)

## 2020-06-19 LAB — COMPREHENSIVE METABOLIC PANEL
ALT: 17 U/L (ref 0–44)
AST: 16 U/L (ref 15–41)
Albumin: 3.8 g/dL (ref 3.5–5.0)
Alkaline Phosphatase: 67 U/L (ref 38–126)
Anion gap: 6 (ref 5–15)
BUN: 20 mg/dL (ref 8–23)
CO2: 29 mmol/L (ref 22–32)
Calcium: 9.7 mg/dL (ref 8.9–10.3)
Chloride: 106 mmol/L (ref 98–111)
Creatinine, Ser: 0.79 mg/dL (ref 0.61–1.24)
GFR calc Af Amer: >60 mL/min (ref >60)
GFR calc non Af Amer: >60 mL/min (ref >60)
Glucose, Bld: 102 mg/dL — ABNORMAL HIGH (ref 70–99)
Potassium: 4.2 mmol/L (ref 3.5–5.1)
Sodium: 141 mmol/L (ref 135–145)
Total Bilirubin: 0.3 mg/dL (ref 0.3–1.2)
Total Protein: 6.9 g/dL (ref 6.5–8.1)

## 2020-06-19 LAB — CBC
HCT: 40.9 % (ref 39.0–52.0)
Hemoglobin: 12.3 g/dL — ABNORMAL LOW (ref 13.0–17.0)
MCH: 28 pg (ref 26.0–34.0)
MCHC: 30.1 g/dL (ref 30.0–36.0)
MCV: 93.2 fL (ref 80.0–100.0)
Platelets: 129 10*3/uL — ABNORMAL LOW (ref 150–400)
RBC: 4.39 MIL/uL (ref 4.22–5.81)
RDW: 15.6 % — ABNORMAL HIGH (ref 11.5–15.5)
WBC: 2.5 10*3/uL — ABNORMAL LOW (ref 4.0–10.5)

## 2020-06-19 LAB — ACETAMINOPHEN LEVEL: Acetaminophen (Tylenol), Serum: <10 ug/mL — ABNORMAL LOW (ref 10–30)

## 2020-06-19 LAB — ETHANOL: Alcohol, Ethyl (B): <10 mg/dL (ref <10)

## 2020-06-19 LAB — VALPROIC ACID LEVEL: Valproic Acid Lvl: 19 ug/mL — ABNORMAL LOW (ref 50.0–100.0)

## 2020-06-19 LAB — SALICYLATE LEVEL: Salicylate Lvl: <7 mg/dL — ABNORMAL LOW (ref 7.0–30.0)

## 2020-06-19 LAB — RAPID URINE DRUG SCREEN, HOSP PERFORMED
Amphetamines: NOT DETECTED
Barbiturates: NOT DETECTED
Benzodiazepines: NOT DETECTED
Cocaine: NOT DETECTED
Opiates: NOT DETECTED
Tetrahydrocannabinol: NOT DETECTED

## 2020-06-19 MED ORDER — LAMOTRIGINE 25 MG PO TABS
50.0000 mg | ORAL_TABLET | Freq: Every day | ORAL | Status: DC
  Administered 2020-06-19: 50 mg via ORAL
  Filled 2020-06-19: qty 2

## 2020-06-19 MED ORDER — AMANTADINE HCL 100 MG PO CAPS
100.0000 mg | ORAL_CAPSULE | Freq: Two times a day (BID) | ORAL | Status: DC
  Filled 2020-06-19 (×4): qty 1

## 2020-06-19 MED ORDER — PAROXETINE HCL 20 MG PO TABS
20.0000 mg | ORAL_TABLET | Freq: Every day | ORAL | Status: DC
  Filled 2020-06-19 (×2): qty 1

## 2020-06-19 MED ORDER — AMLODIPINE BESYLATE 5 MG PO TABS
2.5000 mg | ORAL_TABLET | Freq: Every day | ORAL | Status: DC
  Administered 2020-06-19: 2.5 mg via ORAL
  Filled 2020-06-19: qty 1

## 2020-06-19 MED ORDER — LEVETIRACETAM 500 MG PO TABS
500.0000 mg | ORAL_TABLET | Freq: Two times a day (BID) | ORAL | Status: DC
  Administered 2020-06-19: 500 mg via ORAL
  Filled 2020-06-19: qty 1

## 2020-06-19 MED ORDER — HALOPERIDOL 2 MG PO TABS
2.0000 mg | ORAL_TABLET | Freq: Three times a day (TID) | ORAL | Status: DC
  Filled 2020-06-19 (×5): qty 1

## 2020-06-19 MED ORDER — CEPHALEXIN 500 MG PO CAPS
500.0000 mg | ORAL_CAPSULE | Freq: Four times a day (QID) | ORAL | 0 refills | Status: DC
Start: 2020-06-19 — End: 2020-09-10

## 2020-06-19 MED ORDER — CEPHALEXIN 500 MG PO CAPS
500.0000 mg | ORAL_CAPSULE | Freq: Once | ORAL | Status: AC
  Administered 2020-06-19: 500 mg via ORAL
  Filled 2020-06-19: qty 1

## 2020-06-19 MED ORDER — ACETAMINOPHEN 325 MG PO TABS
650.0000 mg | ORAL_TABLET | Freq: Once | ORAL | Status: AC
  Administered 2020-06-19: 650 mg via ORAL
  Filled 2020-06-19: qty 2
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
## 2020-06-19 NOTE — Discharge Instructions (Addendum)
You are seen in the emergency department for evaluation of increased aggressive behavior.  Your lab work was unremarkable but your urine showed signs of possible urinary tract infection.  We are putting you on antibiotics.  Please follow-up with your regular doctor.  Please try to take your regular medications.  Return to the emergency department if any worsening or concerning symptoms

## 2020-06-19 NOTE — ED Triage Notes (Signed)
Per EMS, pt sent out for evaluation related to aggressive behavior towards staff at [facility]. Pt alert and oriented, calm and cooperative with EMS. VSS per EMS.

## 2020-06-19 NOTE — ED Notes (Signed)
Pt refusing blood work. Pt cursing at phlebotomy.

## 2020-06-22 LAB — URINE CULTURE: Culture: >=100000 — AB

## 2020-06-23 ENCOUNTER — Telehealth: Payer: Self-pay | Admitting: Emergency Medicine

## 2020-06-23 NOTE — Progress Notes (Signed)
ED Antimicrobial Stewardship Positive Culture Follow Up   Faizan Geraci is an 79 y.o. male who presented to [facility] on 06/19/2020 with a chief complaint of AMS/agitation  Chief Complaint  Patient presents with  . Medical Clearance    Recent Results (from the past 720 hour(s))  SARS Coronavirus 2 by RT PCR (hospital order, performed in [facility] hospital lab) Nasopharyngeal Nasopharyngeal Swab     Status: None   Collection Time: 06/05/20 12:13 PM   Specimen: Nasopharyngeal Swab  Result Value Ref Range Status   SARS Coronavirus 2 NEGATIVE NEGATIVE Final    Comment: (NOTE) SARS-CoV-2 target nucleic acids are NOT DETECTED.  The SARS-CoV-2 RNA is generally detectable in upper and lower respiratory specimens during the acute phase of infection. The lowest concentration of SARS-CoV-2 viral copies this assay can detect is 250 copies / mL. A negative result does not preclude SARS-CoV-2 infection and should not be used as the sole basis for treatment or other patient management decisions.  A negative result may occur with improper specimen collection / handling, submission of specimen other than nasopharyngeal swab, presence of viral mutation(s) within the areas targeted by this assay, and inadequate number of viral copies (<250 copies / mL). A negative result must be combined with clinical observations, patient history, and epidemiological information.  Fact Sheet for Patients:   StrictlyIdeas.no  Fact Sheet for Healthcare Providers: BankingDealers.co.za  This test is not yet approved or  cleared by the [place] FDA and has been authorized for detection and/or diagnosis of SARS-CoV-2 by FDA under an Emergency Use Authorization (EUA).  This EUA will remain in effect (meaning this test can be used) for the duration of the COVID-19 declaration under Section 564(b)(1) of the Act, 21 U.S.C. section 360bbb-3(b)(1), unless the  authorization is terminated or revoked sooner.  Performed at [facility], [address] [place], [place] [address]   Urine culture     Status: Abnormal   Collection Time: 06/19/20 10:34 PM   Specimen: Urine, Clean Catch  Result Value Ref Range Status   Specimen Description   Final    URINE, CLEAN CATCH Performed at [facility], [address] [place], [place] [address]    Special Requests   Final    NONE Performed at [facility], [address], [place], [place] [address]    Culture (A)  Final    >=100,000 COLONIES/mL ESCHERICHIA COLI Confirmed Extended Spectrum Beta-Lactamase Producer (ESBL).  In bloodstream infections from ESBL organisms, carbapenems are preferred over piperacillin/tazobactam. They are shown to have a lower risk of mortality.    Report Status 06/22/2020 FINAL  Final   Organism ID, Bacteria ESCHERICHIA COLI (A)  Final      Susceptibility   Escherichia coli - MIC*    AMPICILLIN >=32 RESISTANT Resistant     CEFAZOLIN >=64 RESISTANT Resistant     CEFTRIAXONE >=64 RESISTANT Resistant     CIPROFLOXACIN >=4 RESISTANT Resistant     GENTAMICIN <=1 SENSITIVE Sensitive     IMIPENEM <=0.25 SENSITIVE Sensitive     NITROFURANTOIN <=16 SENSITIVE Sensitive     TRIMETH/SULFA >=320 RESISTANT Resistant     AMPICILLIN/SULBACTAM >=32 RESISTANT Resistant     PIP/TAZO 8 SENSITIVE Sensitive     * >=100,000 COLONIES/mL ESCHERICHIA COLI    [x]  Treated with cephalexin, organism resistant to prescribed antimicrobial []  Patient discharged originally without antimicrobial agent and treatment is now indicated  New antibiotic prescription: Macrobid 100 mg twice daily x 7 days   ED  Provider: Alfredia Client, PA-C    Albertina Parr, PharmD., BCPS, BCCCP Clinical Pharmacist Clinical phone for 06/23/20 until 10pm: [phone] If after 10pm, please refer to [facility] for unit-specific pharmacist

## 2020-06-23 NOTE — Telephone Encounter (Signed)
Post ED Visit - Positive Culture Follow-up: Successful Patient Follow-Up  Culture assessed and recommendations reviewed by:  []  Elenor Quinones, Pharm.D. []  Heide Guile, Pharm.D., BCPS AQ-ID []  Parks Neptune, Pharm.D., BCPS []  Alycia Rossetti, Pharm.D., BCPS []  [place], Pharm.D., BCPS, AAHIVP []  Legrand Como, Pharm.D., BCPS, AAHIVP []  Salome Arnt, PharmD, BCPS []  Johnnette Gourd, PharmD, BCPS []  Hughes Better, PharmD, BCPS [x]  Albertina Parr, PharmD  Positive urine culture  []  Patient discharged without antimicrobial prescription and treatment is now indicated [x]  Organism is resistant to prescribed ED discharge antimicrobial []  Patient with positive blood cultures  Changes discussed with ED provider: Alfredia Client, PA New antibiotic prescription: Macrobid 100 mg q 12 hr x seven days Called/faxed to [facility] (phone [phone]) (fax [phone])  Areta Haber, date 79/01/4096, time [place] 06/23/2020, 4:26 PM

## 2020-07-03 ENCOUNTER — Inpatient Hospital Stay (HOSPITAL_COMMUNITY)
Admission: EM | Admit: 2020-07-03 | Discharge: 2020-09-10 | DRG: 056 | Disposition: A | Discharge status: Other Acute Inpatient Hospital | Payer: Medicare Other | Attending: Internal Medicine | Admitting: Internal Medicine

## 2020-07-03 ENCOUNTER — Encounter (HOSPITAL_COMMUNITY): Payer: Self-pay

## 2020-07-03 ENCOUNTER — Emergency Department (HOSPITAL_COMMUNITY): Payer: Medicare Other

## 2020-07-03 ENCOUNTER — Other Ambulatory Visit: Payer: Self-pay

## 2020-07-03 DIAGNOSIS — F0280 Dementia in other diseases classified elsewhere without behavioral disturbance: Secondary | ICD-10-CM | POA: Diagnosis present

## 2020-07-03 DIAGNOSIS — Z9119 Patient's noncompliance with other medical treatment and regimen: Secondary | ICD-10-CM

## 2020-07-03 DIAGNOSIS — Z781 Physical restraint status: Secondary | ICD-10-CM

## 2020-07-03 DIAGNOSIS — Z91148 Patient's other noncompliance with medication regimen for other reason: Secondary | ICD-10-CM

## 2020-07-03 DIAGNOSIS — F250 Schizoaffective disorder, bipolar type: Secondary | ICD-10-CM | POA: Diagnosis present

## 2020-07-03 DIAGNOSIS — Z8546 Personal history of malignant neoplasm of prostate: Secondary | ICD-10-CM

## 2020-07-03 DIAGNOSIS — R4182 Altered mental status, unspecified: Secondary | ICD-10-CM

## 2020-07-03 DIAGNOSIS — A5217 General paresis: Secondary | ICD-10-CM | POA: Diagnosis present

## 2020-07-03 DIAGNOSIS — G40909 Epilepsy, unspecified, not intractable, without status epilepticus: Secondary | ICD-10-CM | POA: Diagnosis present

## 2020-07-03 DIAGNOSIS — Z880 Allergy status to penicillin: Secondary | ICD-10-CM

## 2020-07-03 DIAGNOSIS — N3000 Acute cystitis without hematuria: Secondary | ICD-10-CM

## 2020-07-03 DIAGNOSIS — R4189 Other symptoms and signs involving cognitive functions and awareness: Secondary | ICD-10-CM | POA: Diagnosis present

## 2020-07-03 DIAGNOSIS — I110 Hypertensive heart disease with heart failure: Secondary | ICD-10-CM | POA: Diagnosis present

## 2020-07-03 DIAGNOSIS — I509 Heart failure, unspecified: Secondary | ICD-10-CM | POA: Diagnosis present

## 2020-07-03 DIAGNOSIS — D61818 Other pancytopenia: Secondary | ICD-10-CM | POA: Diagnosis present

## 2020-07-03 DIAGNOSIS — G9341 Metabolic encephalopathy: Secondary | ICD-10-CM | POA: Diagnosis present

## 2020-07-03 DIAGNOSIS — Z79899 Other long term (current) drug therapy: Secondary | ICD-10-CM

## 2020-07-03 DIAGNOSIS — Z20822 Contact with and (suspected) exposure to covid-19: Secondary | ICD-10-CM | POA: Diagnosis present

## 2020-07-03 DIAGNOSIS — R41841 Cognitive communication deficit: Secondary | ICD-10-CM | POA: Diagnosis present

## 2020-07-03 DIAGNOSIS — F17210 Nicotine dependence, cigarettes, uncomplicated: Secondary | ICD-10-CM | POA: Diagnosis present

## 2020-07-03 DIAGNOSIS — R569 Unspecified convulsions: Secondary | ICD-10-CM

## 2020-07-03 DIAGNOSIS — Z9114 Patient's other noncompliance with medication regimen: Secondary | ICD-10-CM

## 2020-07-03 DIAGNOSIS — D696 Thrombocytopenia, unspecified: Secondary | ICD-10-CM | POA: Diagnosis present

## 2020-07-03 DIAGNOSIS — J449 Chronic obstructive pulmonary disease, unspecified: Secondary | ICD-10-CM | POA: Diagnosis present

## 2020-07-03 HISTORY — DX: Unspecified convulsions: R56.9

## 2020-07-03 LAB — URINALYSIS, ROUTINE W REFLEX MICROSCOPIC
Bilirubin Urine: NEGATIVE
Glucose, UA: NEGATIVE mg/dL
Hgb urine dipstick: NEGATIVE
Ketones, ur: NEGATIVE mg/dL
Leukocytes,Ua: NEGATIVE
Nitrite: POSITIVE — AB
Protein, ur: NEGATIVE mg/dL
Specific Gravity, Urine: 1.018 (ref 1.005–1.030)
pH: 7 (ref 5.0–8.0)

## 2020-07-03 LAB — CBC WITH DIFFERENTIAL/PLATELET
Abs Immature Granulocytes: 0.01 10*3/uL (ref 0.00–0.07)
Basophils Absolute: 0 10*3/uL (ref 0.0–0.1)
Basophils Relative: 0 %
Eosinophils Absolute: 0.1 10*3/uL (ref 0.0–0.5)
Eosinophils Relative: 2 %
HCT: 41.4 % (ref 39.0–52.0)
Hemoglobin: 12.8 g/dL — ABNORMAL LOW (ref 13.0–17.0)
Immature Granulocytes: 0 %
Lymphocytes Relative: 37 %
Lymphs Abs: 1.1 10*3/uL (ref 0.7–4.0)
MCH: 28 pg (ref 26.0–34.0)
MCHC: 30.9 g/dL (ref 30.0–36.0)
MCV: 90.6 fL (ref 80.0–100.0)
Monocytes Absolute: 0.3 10*3/uL (ref 0.1–1.0)
Monocytes Relative: 9 %
Neutro Abs: 1.6 10*3/uL — ABNORMAL LOW (ref 1.7–7.7)
Neutrophils Relative %: 52 %
Platelets: 126 10*3/uL — ABNORMAL LOW (ref 150–400)
RBC: 4.57 MIL/uL (ref 4.22–5.81)
RDW: 15.9 % — ABNORMAL HIGH (ref 11.5–15.5)
WBC: 3.1 10*3/uL — ABNORMAL LOW (ref 4.0–10.5)
nRBC: 0 % (ref 0.0–0.2)

## 2020-07-03 LAB — COMPREHENSIVE METABOLIC PANEL
ALT: 18 U/L (ref 0–44)
AST: 17 U/L (ref 15–41)
Albumin: 4.1 g/dL (ref 3.5–5.0)
Alkaline Phosphatase: 62 U/L (ref 38–126)
Anion gap: 9 (ref 5–15)
BUN: 29 mg/dL — ABNORMAL HIGH (ref 8–23)
CO2: 26 mmol/L (ref 22–32)
Calcium: 10 mg/dL (ref 8.9–10.3)
Chloride: 111 mmol/L (ref 98–111)
Creatinine, Ser: 1.23 mg/dL (ref 0.61–1.24)
GFR calc Af Amer: >60 mL/min (ref >60)
GFR calc non Af Amer: 56 mL/min — ABNORMAL LOW (ref >60)
Glucose, Bld: 108 mg/dL — ABNORMAL HIGH (ref 70–99)
Potassium: 4.1 mmol/L (ref 3.5–5.1)
Sodium: 146 mmol/L — ABNORMAL HIGH (ref 135–145)
Total Bilirubin: 0.5 mg/dL (ref 0.3–1.2)
Total Protein: 7.4 g/dL (ref 6.5–8.1)

## 2020-07-03 LAB — VALPROIC ACID LEVEL: Valproic Acid Lvl: <10 ug/mL — ABNORMAL LOW (ref 50.0–100.0)

## 2020-07-03 LAB — SARS CORONAVIRUS 2 BY RT PCR (HOSPITAL ORDER, PERFORMED IN ~~LOC~~ HOSPITAL LAB): SARS Coronavirus 2: NEGATIVE

## 2020-07-03 MED ORDER — STERILE WATER FOR INJECTION IJ SOLN
INTRAMUSCULAR | Status: AC
  Filled 2020-07-03: qty 10

## 2020-07-03 MED ORDER — SODIUM CHLORIDE 0.9 % IV BOLUS
1000.0000 mL | Freq: Once | INTRAVENOUS | Status: AC
  Administered 2020-07-03: 1000 mL via INTRAVENOUS

## 2020-07-03 MED ORDER — SODIUM CHLORIDE 0.9 % IV SOLN
1.0000 g | Freq: Once | INTRAVENOUS | Status: AC
  Administered 2020-07-03: 1 g via INTRAVENOUS
  Filled 2020-07-03: qty 10

## 2020-07-03 MED ORDER — ZIPRASIDONE MESYLATE 20 MG IM SOLR
10.0000 mg | Freq: Once | INTRAMUSCULAR | Status: AC
  Administered 2020-07-03: 10 mg via INTRAMUSCULAR
  Filled 2020-07-03: qty 20

## 2020-07-03 MED ORDER — LORAZEPAM 2 MG/ML IJ SOLN
2.0000 mg | Freq: Once | INTRAMUSCULAR | Status: AC
  Administered 2020-07-03: 2 mg via INTRAMUSCULAR
  Filled 2020-07-03: qty 1

## 2020-07-03 NOTE — ED Notes (Signed)
Per PA pt has now been IVC, Agricultural consultant notified.

## 2020-07-03 NOTE — ED Notes (Addendum)
Asked pt if staff could change him to incont of urine, pt refuses to be changed.  Pt started cussing at staff stating "fuck you", witnessed by security

## 2020-07-03 NOTE — ED Notes (Signed)
Pt has tele sitter at bedside.

## 2020-07-03 NOTE — ED Notes (Signed)
Pt refused all interventions.

## 2020-07-03 NOTE — ED Triage Notes (Signed)
Pt resident of [facility].  [facility] sent pt for eval because of altered mental status.  REports was found drinking his urine, has been threatening staff, and not taking his medications.

## 2020-07-03 NOTE — ED Notes (Signed)
Went to pt's room to give medication, pt states that he doesn't need it and "if you give me something I don't need, I am going to kill you mother fucker"
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
## 2020-07-03 NOTE — ED Notes (Signed)
Went to pts room to de escalate and attempt blood draw, pt appears to be drinking from his urinal,  MD Sabra Heck and PA notified,

## 2020-07-03 NOTE — ED Notes (Signed)
Pt sitting up in bed, pt calling this RN a faggot, states that if I touch him he will kill me, X ray at bedside, pt calling the X ray tech a bitch and for her to get away from him, unable to get x ray, pt continues to swear and call this RN names, explained to pt that when he would like and could behave accordingly I would return to continue with the MD plan of care, call bell within reach.

## 2020-07-03 NOTE — ED Notes (Signed)
Pt continues to cure and threaten staff, pa at bedside talking about medication, pt refused
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
## 2020-07-04 ENCOUNTER — Other Ambulatory Visit: Payer: Self-pay

## 2020-07-04 ENCOUNTER — Encounter (HOSPITAL_COMMUNITY): Payer: Self-pay | Admitting: Family Medicine

## 2020-07-04 DIAGNOSIS — R41841 Cognitive communication deficit: Secondary | ICD-10-CM | POA: Diagnosis present

## 2020-07-04 DIAGNOSIS — F259 Schizoaffective disorder, unspecified: Secondary | ICD-10-CM | POA: Diagnosis not present

## 2020-07-04 DIAGNOSIS — Z8546 Personal history of malignant neoplasm of prostate: Secondary | ICD-10-CM | POA: Diagnosis not present

## 2020-07-04 DIAGNOSIS — D696 Thrombocytopenia, unspecified: Secondary | ICD-10-CM | POA: Diagnosis not present

## 2020-07-04 DIAGNOSIS — G40909 Epilepsy, unspecified, not intractable, without status epilepticus: Secondary | ICD-10-CM | POA: Diagnosis present

## 2020-07-04 DIAGNOSIS — R569 Unspecified convulsions: Secondary | ICD-10-CM | POA: Diagnosis not present

## 2020-07-04 DIAGNOSIS — N3000 Acute cystitis without hematuria: Secondary | ICD-10-CM

## 2020-07-04 DIAGNOSIS — G9341 Metabolic encephalopathy: Secondary | ICD-10-CM | POA: Diagnosis present

## 2020-07-04 DIAGNOSIS — F17210 Nicotine dependence, cigarettes, uncomplicated: Secondary | ICD-10-CM | POA: Diagnosis present

## 2020-07-04 DIAGNOSIS — F0280 Dementia in other diseases classified elsewhere without behavioral disturbance: Secondary | ICD-10-CM | POA: Diagnosis present

## 2020-07-04 DIAGNOSIS — J449 Chronic obstructive pulmonary disease, unspecified: Secondary | ICD-10-CM | POA: Diagnosis present

## 2020-07-04 DIAGNOSIS — D61818 Other pancytopenia: Secondary | ICD-10-CM | POA: Diagnosis present

## 2020-07-04 DIAGNOSIS — Z20822 Contact with and (suspected) exposure to covid-19: Secondary | ICD-10-CM | POA: Diagnosis present

## 2020-07-04 DIAGNOSIS — G20 Parkinson's disease: Secondary | ICD-10-CM

## 2020-07-04 DIAGNOSIS — A5217 General paresis: Secondary | ICD-10-CM | POA: Diagnosis present

## 2020-07-04 DIAGNOSIS — I110 Hypertensive heart disease with heart failure: Secondary | ICD-10-CM | POA: Diagnosis present

## 2020-07-04 DIAGNOSIS — R4182 Altered mental status, unspecified: Secondary | ICD-10-CM | POA: Diagnosis not present

## 2020-07-04 DIAGNOSIS — F250 Schizoaffective disorder, bipolar type: Secondary | ICD-10-CM | POA: Diagnosis present

## 2020-07-04 DIAGNOSIS — Z880 Allergy status to penicillin: Secondary | ICD-10-CM | POA: Diagnosis not present

## 2020-07-04 DIAGNOSIS — Z79899 Other long term (current) drug therapy: Secondary | ICD-10-CM | POA: Diagnosis not present

## 2020-07-04 DIAGNOSIS — Z9119 Patient's noncompliance with other medical treatment and regimen: Secondary | ICD-10-CM | POA: Diagnosis not present

## 2020-07-04 DIAGNOSIS — Z9114 Patient's other noncompliance with medication regimen: Secondary | ICD-10-CM | POA: Diagnosis not present

## 2020-07-04 DIAGNOSIS — R4189 Other symptoms and signs involving cognitive functions and awareness: Secondary | ICD-10-CM | POA: Diagnosis present

## 2020-07-04 DIAGNOSIS — I509 Heart failure, unspecified: Secondary | ICD-10-CM | POA: Diagnosis present

## 2020-07-04 DIAGNOSIS — F0281 Dementia in other diseases classified elsewhere with behavioral disturbance: Secondary | ICD-10-CM

## 2020-07-04 DIAGNOSIS — Z781 Physical restraint status: Secondary | ICD-10-CM | POA: Diagnosis not present

## 2020-07-04 LAB — TSH: TSH: 2.802 u[IU]/mL (ref 0.350–4.500)

## 2020-07-04 LAB — BLOOD GAS, VENOUS
Acid-Base Excess: 3.1 mmol/L — ABNORMAL HIGH (ref 0.0–2.0)
Bicarbonate: 24.7 mmol/L (ref 20.0–28.0)
Drawn by: 1528
FIO2: 21
O2 Saturation: 24.9 %
Patient temperature: 37
pCO2, Ven: 58.6 mmHg (ref 44.0–60.0)
pH, Ven: 7.313 (ref 7.250–7.430)
pO2, Ven: <31 mmHg — CL (ref 32.0–45.0)

## 2020-07-04 LAB — AMMONIA: Ammonia: 14 umol/L (ref 9–35)

## 2020-07-04 MED ORDER — LORAZEPAM 1 MG PO TABS
1.0000 mg | ORAL_TABLET | Freq: Three times a day (TID) | ORAL | Status: DC | PRN
  Administered 2020-07-07 – 2020-07-24 (×4): 1 mg via ORAL
  Filled 2020-07-04 (×6): qty 1

## 2020-07-04 MED ORDER — AMANTADINE HCL 100 MG PO CAPS
100.0000 mg | ORAL_CAPSULE | Freq: Two times a day (BID) | ORAL | Status: DC
  Administered 2020-07-07 – 2020-09-10 (×82): 100 mg via ORAL
  Filled 2020-07-04 (×114): qty 1

## 2020-07-04 MED ORDER — HEPARIN SODIUM (PORCINE) 5000 UNIT/ML IJ SOLN
5000.0000 [IU] | Freq: Three times a day (TID) | INTRAMUSCULAR | Status: DC
  Administered 2020-07-10 – 2020-09-07 (×21): 5000 [IU] via SUBCUTANEOUS
  Filled 2020-07-04 (×56): qty 1

## 2020-07-04 MED ORDER — ACETAMINOPHEN 650 MG RE SUPP: 650.0000 mg | Freq: Four times a day (QID) | RECTAL | Status: DC | PRN

## 2020-07-04 MED ORDER — FLUOXETINE HCL 20 MG PO CAPS
40.0000 mg | ORAL_CAPSULE | Freq: Every day | ORAL | Status: DC
  Administered 2020-07-07 – 2020-09-10 (×45): 40 mg via ORAL
  Filled 2020-07-04 (×61): qty 2

## 2020-07-04 MED ORDER — LORAZEPAM 2 MG/ML IJ SOLN
2.0000 mg | Freq: Once | INTRAMUSCULAR | Status: AC
  Administered 2020-07-04: 2 mg via INTRAMUSCULAR
  Filled 2020-07-04: qty 1

## 2020-07-04 MED ORDER — MEMANTINE HCL 5 MG PO TABS
5.0000 mg | ORAL_TABLET | Freq: Every day | ORAL | Status: DC
  Administered 2020-07-10 – 2020-07-27 (×7): 5 mg via ORAL
  Filled 2020-07-04 (×21): qty 1

## 2020-07-04 MED ORDER — ACETAMINOPHEN 325 MG PO TABS
650.0000 mg | ORAL_TABLET | Freq: Four times a day (QID) | ORAL | Status: DC | PRN
  Administered 2020-07-18 – 2020-09-03 (×6): 650 mg via ORAL
  Filled 2020-07-04 (×13): qty 2

## 2020-07-04 MED ORDER — VALPROATE SODIUM 500 MG/5ML IV SOLN
1000.0000 mg | Freq: Two times a day (BID) | INTRAVENOUS | Status: DC
  Filled 2020-07-04 (×8): qty 10

## 2020-07-04 MED ORDER — HALOPERIDOL LACTATE 2 MG/ML PO CONC
2.0000 mg | Freq: Three times a day (TID) | ORAL | Status: DC
  Filled 2020-07-04 (×5): qty 1

## 2020-07-04 MED ORDER — HALOPERIDOL 2 MG PO TABS: 2.0000 mg | ORAL_TABLET | Freq: Three times a day (TID) | ORAL | Status: DC

## 2020-07-04 MED ORDER — AMLODIPINE BESYLATE 5 MG PO TABS
5.0000 mg | ORAL_TABLET | Freq: Every day | ORAL | Status: DC
  Administered 2020-07-07 – 2020-09-10 (×46): 5 mg via ORAL
  Filled 2020-07-04 (×62): qty 1

## 2020-07-04 MED ORDER — LORAZEPAM 2 MG/ML IJ SOLN
2.0000 mg | INTRAMUSCULAR | Status: DC | PRN
  Administered 2020-07-05 – 2020-09-06 (×28): 2 mg via INTRAMUSCULAR
  Filled 2020-07-04 (×31): qty 1

## 2020-07-04 MED ORDER — SODIUM CHLORIDE 0.9 % IV SOLN
1.0000 g | Freq: Three times a day (TID) | INTRAVENOUS | Status: DC
  Administered 2020-07-04: 1 g via INTRAVENOUS
  Filled 2020-07-04 (×4): qty 1

## 2020-07-04 MED ORDER — ZIPRASIDONE MESYLATE 20 MG IM SOLR
20.0000 mg | Freq: Once | INTRAMUSCULAR | Status: AC
  Administered 2020-07-04: 20 mg via INTRAMUSCULAR
  Filled 2020-07-04: qty 20

## 2020-07-04 MED ORDER — LORAZEPAM 2 MG/ML PO CONC: 1.0000 mg | Freq: Three times a day (TID) | ORAL | Status: DC | PRN

## 2020-07-04 MED ORDER — HALOPERIDOL LACTATE 5 MG/ML IJ SOLN
2.0000 mg | Freq: Three times a day (TID) | INTRAMUSCULAR | Status: DC
  Administered 2020-07-05: 2 mg via INTRAVENOUS
  Filled 2020-07-04 (×3): qty 1

## 2020-07-04 MED ORDER — DIVALPROEX SODIUM 125 MG PO CSDR
1000.0000 mg | DELAYED_RELEASE_CAPSULE | Freq: Two times a day (BID) | ORAL | Status: DC
  Filled 2020-07-04 (×3): qty 8

## 2020-07-04 MED ORDER — LEVETIRACETAM IN NACL 500 MG/100ML IV SOLN
500.0000 mg | Freq: Two times a day (BID) | INTRAVENOUS | Status: DC
  Filled 2020-07-04: qty 100

## 2020-07-04 MED ORDER — LEVETIRACETAM 500 MG PO TABS
500.0000 mg | ORAL_TABLET | Freq: Two times a day (BID) | ORAL | Status: DC
  Filled 2020-07-04 (×2): qty 1

## 2020-07-04 MED ORDER — SODIUM CHLORIDE 0.9 % IV SOLN: INTRAVENOUS | Status: AC
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
## 2020-07-04 NOTE — ED Notes (Signed)
Pt has been resting quietly and been cooperative with pt care

## 2020-07-04 NOTE — Progress Notes (Addendum)
Patient admitted to the hospital earlier this morning by Dr. Clearence Ped  He is a resident of [facility].  He was sent to the emergency room for increasing aggression, threatening staff, bizarre behavior such as drinking his own urine.  Currently, patient is laying in bed, he is calm on my arrival.  He says he feels fine, denies any dysuria, no shortness of breath, no pain, no nausea or vomiting.  Speech is somewhat tangential and pressured at times.  Seems to lack insight into his chronic medical issues.  Says that he does not take any chronic medications, although his PTA medication list appears to be extensive.  Patient does not allow me to approach him to examine him.  He threatens to become violent if I try to examine him.  Sitter is currently in the room.  Work-up thus far has been unrevealing.  Basic chemistries and CBC unremarkable.  CT head negative.  Chest x-ray did not show any acute findings.  Urinalysis was positive for nitrates, but had minimal leukocytes.  Patient was started on IV Merrem and admitted for further evaluation  Review of records indicate that patient has been refusing medications, been aggressive for several weeks now.  He does not have any fever or leukocytosis or dysuria.  I feel it is unlikely that his current presentation is solely related to a urinary tract infection.  In fact, based on prior culture results, he may have urinary tract colonization.  The patient has several underlying cognitive issues that could be contributing to his current state.  He has known dementia, history of neurosyphilis, schizoaffective disorder. I suspect this may be worsening of his schizoaffective disorder.  Patient required several doses of Geodon and Ativan in the emergency room.  Since the patient has been repeatedly drinking his own urine, it was felt that he was at risk to harm himself and involuntary commitment orders were placed in the emergency room.  In his current state, it is very  challenging to provide adequate care since he is repeatedly threatening staff, pulling out IVs and refusing any medications.  Will request psychiatry input regarding medication management and assistance in providing care for this patient. ?  If patient would benefit from being transferred to a [place] such as [facility] where his medications can be adjusted.  Time spent: 35 mins, >50% of time spent with face to face interaction with patient, review of labs,imaging an prior records. Time also spent formulating plan of care.   Yoceline Bazar    Addendum 18:00:  Patient had received Geodon/Ativan earlier today an IV line had been placed.  Once medication effects wore off, patient refused to take any medications by IV and ended up removing his IV.  When he is left alone, he does not appear agitated.  When anyone discusses his medications or tries to stop him from performing abnormal behaviors (drinking urine and now eating his feces) patient becomes very agitated and combative.  Psychiatry consult has already been requested, awaiting further input.  Patient is otherwise afebrile, does not have any signs of ongoing infection.  He is not confused and knows he is in the hospital, knows the month and year, lacks insight into his medical/mental health issues.  We will continue to use sedative medications as needed overnight to try and establish IV access.  Change Keppra, Depakote and Haldol to IV form.  Patient remains involuntarily committed at this time pending psychiatry input.  [employer]

## 2020-07-04 NOTE — Progress Notes (Signed)
Patient refusing all PO medications. Patient states he doesn't take this stuff.

## 2020-07-04 NOTE — Progress Notes (Signed)
In to speak with patient and attempt to give morning medications. Patient initially receptive to RN in general conversation, however, once patient was asked about medications he takes at home, patient became agitated and started talking in tangents about his medical history and how he does not have any medical problems and that the doctors are making up things about him and trying to make him sick with medicines. Patient stated he would not take any medications for this RN or for any staff. Primary RN made aware of his refusal of medication.

## 2020-07-04 NOTE — BH Assessment (Signed)
Comprehensive Clinical Assessment (CCA) Screening, Triage and Referral Note   Per EDP" Henry Smith  is a 79 y.o. male, of thrombocytopenia, syphilis, seizures, schizophrenia, dementia, COPD, CHF, and more presents to the ED from [facility] for altered mental status. "   During assessment pt presented with garbled speech and was too drowsy to answer questions and was very incoherent in his hearing. Pt could not understand or hear very well. Pt was asked what brought him into the hospital and he states he is not sure. Pt was asked about self harm and harming others he yells, " No I would never do that". Pt currently denied SI, HI, AVH and SIB. Pt was asked further about his living situation and he mentioned living near by but he when askee more questions about mental health pt was not able to further interpret questions. Pt was asked for collateral he stated to call his sister Benjamine Mola.   Collateral:  TTS attempted to call Gerrit Friends at [phone] multiple times, left HIPPA compliant voicemail. TTS called [facility] at [phone] ans spoke with Nurse April Bryant, RN that works at the facility the patient is living at. She states that per pt chart he has a history of Eplisyep, dementia, Schizophrenia, COPD, heart failure since November 2018. She states however she has not seen any symptoms of patient having mental health issues or symptoms,Says she has not seen any signs of mental illness (schizophrenia) states patient has been more aggressive and verbally abusive towards peers but no suicidal/homicdal gestures or behaviors. She reports pt has been refusing his medications, drinking his own urine and been more restless for the past week. States pt had test for UTI done and is taking antibiotics for this since July 20th. Nurse gave TTS additional information: Brother in [place]-[phone]/ cell [phone] [name]) [place] ([place]) [phone] other [phone])       Diagnosis: Altered Mental Status   Disposition:Adaku, Anike, FNP recommends disposition pending additional collateral information (family members and legal guardian).  Comprehensive Clinical Assessment (CCA) Screening, Triage and Referral Note  07/05/2020 Buren Havey [id number]  Visit Diagnosis:    ICD-10-CM   1. Acute cystitis without hematuria  N30.00   2. Altered mental status, unspecified altered mental status type  R41.82     Patient Reported Information How did you hear about [place]? Other (Comment) ([place])   Referral name: No data recorded  Referral phone number: No data recorded Whom do you see for routine medical problems? Other (Comment) Cira Servant)   Practice/Facility Name: No data recorded  Practice/Facility Phone Number: No data recorded  Name of Contact: No data recorded  Contact Number: No data recorded  Contact Fax Number: No data recorded  Prescriber Name: No data recorded  Prescriber Address (if known): No data recorded What Is the Reason for Your Visit/Call Today? No data recorded How Long Has This Been Causing You Problems? <Week  Have You Recently Been in Any Inpatient Treatment (Hospital/Detox/Crisis Center/28-Day Program)? No   Name/Location of Program/Hospital:No data recorded  How Long Were You There? No data recorded  When Were You Discharged? No data recorded Have You Ever Received Services From [facility] Before? No   Who Do You See at [facility]? No data recorded Have You Recently Had Any Thoughts About Hurting Yourself? No   Are You Planning to Commit Suicide/Harm Yourself At This time?  No  Have you Recently Had Thoughts About [place]? No   Explanation: No data recorded Have You  Used Any Alcohol or Drugs in the Past 24 Hours? No data recorded  How Long Ago Did You Use Drugs or Alcohol?  No data recorded  What Did You Use and How Much? No data recorded What Do You Feel Would Help You the Most Today?  Assessment Only  Do You Currently Have a Therapist/Psychiatrist? No data recorded  Name of Therapist/Psychiatrist: No data recorded  Have You Been Recently Discharged From Any Office Practice or Programs? No data recorded  Explanation of Discharge From Practice/Program:  No data recorded    CCA Screening Triage Referral Assessment Type of Contact: Tele-Assessment   Is this Initial or Reassessment? Initial Assessment   Date Telepsych consult ordered in [facility]:  07/04/20   Time Telepsych consult ordered in [facility]:  2306  Patient Reported Information Reviewed? Yes   Patient Left Without Being Seen? No data recorded  Reason for Not Completing Assessment: not medically cleared  Collateral Involvement: yes family member (sister) (yes family member (sister))  Does Patient Have a Stage manager Guardian? No data recorded  Name and Contact of Legal Guardian:  Jamelle Rushing [phone]  If Minor and Not Living with Parent(s), Who has Custody? No data recorded Is CPS involved or ever been involved? No data recorded Is APS involved or ever been involved? No data recorded Patient Determined To Be At Risk for Harm To Self or Others Based on Review of Patient Reported Information or Presenting Complaint? No   Method: No data recorded  Availability of Means: No data recorded  Intent: No data recorded  Notification Required: No data recorded  Additional Information for Danger to Others Potential:  No data recorded  Additional Comments for Danger to Others Potential:  No data recorded  Are There Guns or Other Weapons in Your Home?  No data recorded   Types of Guns/Weapons: No data recorded   Are These Weapons Safely Secured?                              No data recorded   Who Could Verify You Are Able To Have These Secured:    No data recorded Do You Have any Outstanding Charges, Pending Court Dates, Parole/Probation? No data recorded Contacted To Inform of Risk of Harm To Self or  Others: No data recorded Location of Assessment: [facility]  Does Patient Present under Involuntary Commitment? No   IVC Papers Initial File Date: No data recorded  [place] of Residence: [place]  Patient Currently Receiving the Following Services: UTA  Determination of Need: Unknown at this time  Options For Referral: Social research officer, government;Other: Comment   Donato Heinz, LCSWA           CCA Biopsychosocial  Intake/Chief Complaint:     Mental Health Symptoms Depression:   UTA  Mania:   UTA  Anxiety:    UTA  Psychosis:   UTA  Trauma:   UTA  Obsessions:   UTA  Compulsions:   UTA  Inattention:   UTA  Hyperactivity/Impulsivity:   UTA  Oppositional/Defiant Behaviors:   UTA  Emotional Irregularity:   UTA  Other Mood/Personality Symptoms:    (Altered Mental Status)   Mental Status Exam Appearance and self-care  Stature:  Stature: Average  Weight:  Weight: Average weight  Clothing:   Casual  Grooming:  Grooming: Normal  Cosmetic use:  Cosmetic Use: Age appropriate  Posture/gait:  Posture/Gait: Normal  Motor activity:  Motor Activity: Slowed  Sensorium  Attention:  Attention: Normal  Concentration:  Concentration: Variable  Orientation:  Orientation: Person  Recall/memory:  Recall/Memory: Defective in Short-term, Defective in Recent  Affect and Mood  Affect:  Affect: Appropriate  Mood:  Mood: Euthymic  Relating  Eye contact:  Eye Contact: Fleeting  Facial expression:  Facial Expression:  (Positive)  Attitude toward examiner:  Attitude Toward Examiner: Cooperative  Thought and Language  Speech flow: Speech Flow: Garbled, Articulation error  Thought content:  Thought Content: Appropriate to Mood and Circumstances  Preoccupation:  Preoccupations: None  Hallucinations:  Hallucinations: None  Organization:     Transport planner of Knowledge:  Fund of Knowledge: Impoverished by (Comment)  Intelligence:  Intelligence: Average  Abstraction:  Abstraction:  Normal  Judgement:  Judgement: Normal  Reality Testing:  Reality Testing: Variable  Insight:  Insight: None/zero insight  Decision Making:  Decision Making:  Special educational needs teacher)  Social Functioning  Social Maturity:  Social Maturity:  Special educational needs teacher)  Social Judgement:  Social Judgement:  (UTA)  Stress  Stressors:  Stressors:  (UTA)  Coping Ability:  Coping Ability:  Special educational needs teacher)  Skill Deficits:  Skill Deficits:  Special educational needs teacher)  Supports:  Supports:  Special educational needs teacher)     Religion: Religion/Spirituality How Might This Affect Treatment?:  (UTA)  Leisure/Recreation: Leisure / Recreation Do You Have Hobbies?:  (UTA)  Exercise/Diet: Exercise/Diet Do You Exercise?:  (UTA) Have You Gained or Lost A Significant Amount of Weight in the Past Six Months?:  (UTA) Do You Follow a Special Diet?:  (UTA) Do You Have Any Trouble Sleeping?:  (UTA)   CCA Employment/Education  Employment/Work Situation:UTA    Education:UTA     CCA Family/Childhood History  Family and Relationship History:UTA    Childhood History: UTA    Child/Adolescent Assessment:UTA     CCA Substance Use  Alcohol/Drug Use:UTA      DSM5 Diagnoses: Patient Active Problem List   Diagnosis Date Noted  . Acute metabolic encephalopathy 67/20/9470  . DNR (do not resuscitate) discussion   . Seizure ([place]) 06/05/2020  . COPD (chronic obstructive pulmonary disease) ([place])   . Dysphagia   . Noncompliance with medication regimen   . Goals of care, counseling/discussion   . Palliative care by specialist   . AMS (altered mental status) 01/08/2020  . COVID-19 virus infection 01/02/2020  . Sepsis ([place]) 01/01/2020  . 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 Hypertensive urgency   . Acute respiratory failure with hypoxia ([place])   . Acute respiratory failure due to COVID-19 ([place])   . Proteus mirabilis infection 04/27/2018  . Bacteremia 04/27/2018  . Sepsis due to Gram negative bacteria ([place]) 04/27/2018  . UTI (urinary tract infection) 04/24/2018  .  Acute encephalopathy 04/24/2018  . Sepsis secondary to UTI ([place]) 04/23/2018  . Tobacco use disorder 01/02/2018  . Cognitive impairment 12/29/2017  . Schizoaffective disorder, bipolar type ([place]) 12/29/2017  . Gait abnormality 05/06/2017  . Allergic drug rash 03/03/2017  . Altered mental status 02/26/2017  . Syphilis 01/31/2017  . Disorientation   . Generalized weakness 01/30/2017  . Altered mental status, unspecified 01/28/2017  . Hypercalcemia 11/01/2016  . Prostate cancer ([place]) 10/30/2016  . Leukopenia 10/30/2016  . Thrombocytopenia ([place]) 10/30/2016

## 2020-07-05 DIAGNOSIS — F250 Schizoaffective disorder, bipolar type: Secondary | ICD-10-CM | POA: Diagnosis not present

## 2020-07-05 DIAGNOSIS — Z9114 Patient's other noncompliance with medication regimen: Secondary | ICD-10-CM | POA: Diagnosis not present

## 2020-07-05 DIAGNOSIS — G9341 Metabolic encephalopathy: Secondary | ICD-10-CM | POA: Diagnosis not present

## 2020-07-05 DIAGNOSIS — A5217 General paresis: Secondary | ICD-10-CM | POA: Diagnosis not present

## 2020-07-05 DIAGNOSIS — F0280 Dementia in other diseases classified elsewhere without behavioral disturbance: Secondary | ICD-10-CM

## 2020-07-05 NOTE — BH Assessment (Addendum)
Discussed disposition with Priscille Loveless, NP and inpatient treatment was recommended. Pending [facility] placement. LCSW to seek placement.   Patient's nurse Velna Hatchet) updated and made aware of patient's disposition recommendations.

## 2020-07-05 NOTE — Progress Notes (Signed)
MD notified of no IV access to give current medication orders. MD advised IV meds will be dc'd or changed to IM

## 2020-07-05 NOTE — BH Assessment (Signed)
Needs collateral   Henry Smith, Anike, FNP recommends disposition pending additional collateral information (family members and legal guardian). Legal Yetta Barre [facility] [place]) [phone] other [phone]) contacted and states the following:   Brayton Layman is a new guardian for patient x1 month. States that she has had face to face interactions approximately 3x's since taking over guardian. She confirms that patient has a history of Eplisyep, dementia, Schizophrenia, COPD, heart failure. She says that his mental health symptoms have worsened since May 2021. However, she is unaware of his baseline because she is newly managing his care.   She indicated that she not surprised to learn of his most recent behaviors such as drinking his urine or aggression toward staff at his residence. She explains that he has been refusing to take his medications since May and this is more than likely the result. He started out by "picking and choosing which medications he wanted". This led to him not wanting to take any medications at all.  States that staff at the SNF struggle daily with getting patient to take medications. They have tried placing medications in his food or drinks. If patient suspects that the medications are in his food he will refuse to eat for days at a time.   Guardian also indicates that patient is experiencing increased issues with his gait and falls over the past month. She says that their is a new onset of seizures since she has assumed guardianship. Additionally, a "really bad seizure" that happened 3 weeks ago where he "bit thru his tongue". She has noticed since the seizure occurred the symptoms have also worsened.   The guardian did not know any related information and/or history as it relates to patient's history of SI, self mutilating behaviors, HI, and/or history of alcohol/drug use. She did inform me that patient has no history of inpatient psychiatric treatment to her knowledge.   The guardian  states that the SNF [facility]) is reluctant to take patient back into their facility at this time. However, she was not clear if he actually been discharged or given a notice to discharge. She indicates that the facility has discussed with her patient's need for a higher level of care facility as they are unable to manage patient's behaviors.   The guardian asked about patient's medical disposition. She was advised to contact patient's nurse to discuss those issues further.   Clinician will discuss the above collateral information obtained from the guardian. Disposition pending at this time.

## 2020-07-05 NOTE — Progress Notes (Signed)
PROGRESS NOTE    Henry Smith  XBW:[id number] DOB: Jan 19, 1941 DOA: 07/03/2020 PCP: Caprice Renshaw, MD    Brief Narrative:  79 year old male with a history of neurosyphilis, dementia, schizoaffective disorder, COPD, thrombocytopenia, seizure disorder, who is a long-term resident of a nursing facility, was brought to the hospital with progressive changes in mental status.  Patient had been coming increasingly aggressive, was refusing any medications, becoming paranoid.  More recently, staff had noted that he was drinking his urine.  He was evaluated in the emergency room and felt that he may have a urinary tract infection was admitted to the hospital service.  Further review of urine studies indicate that his positive urine culture is likely a colonization and not a true infection.  He did not have any other signs of active infection.  Discussed with infectious disease who agreed with not treating with antibiotics.  It was felt that his progressive decline in mental status was related to his underlying behavioral health.  Seen by psychiatry with recommendations for inpatient psychiatry.  He is involuntarily committed.  Patient is medically stable for discharge whenever he can be placed by psychiatry.   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 Progressive behavioral changes.  Initially felt to be related to possible UTI, but patient likely has colonization and not a true urinary tract infection.  He has been refusing his medications for weeks to months.  He is on several psychotropic medications such as Depakote, Haldol, Ativan.  He also takes Lamictal, Keppra for seizures.  His behavior changes appear to be chronic progressive in nature and appear to be secondary to his underlying mental  illness.  Other work-up including CT imaging of head, basic chemistry, ammonia, TSH, ABG were unrevealing.  Appreciate psychiatry input.  Recommendations are for inpatient psychiatry.  He is currently under involuntary commitment. 2. ESBL in urine.  Discussed with infectious disease, Dr. Drucilla Schmidt.  With no leukocytosis, fever, dysuria, unlikely to be an active infection.  Felt to be more related to colonization.  Antibiotics were not recommended in this situation. 3. Dementia associate with neurosyphilis.  Review of records indicate that he was treated for neurosyphilis in 2018.  He is chronically on Namenda which she has been refusing. 4. Thrombocytopenia.  Appears to be a chronic issue. 5. Seizure disorder.  Chronically on Keppra and Lamictal.  He has been refusing these medications.  He has IM Ativan as needed ordered if he has any breakthrough seizures. 6. Noncompliance.  Patient has been refusing medications for several weeks to months at this point.     DVT prophylaxis: heparin injection 5,000 Units Start: 07/04/20 0600 SCDs Start: 07/04/20 0130  Code Status: full code Family Communication: left VM for legal guardian Loa Socks 7/22 Disposition Plan: Status is: Inpatient  Remains inpatient appropriate because:Unsafe d/c plan   Dispo: The patient is from: SNF              Anticipated d/c is to: Inpatient psychiatry              Anticipated d/c date is: 1 day              Patient currently is medically stable to d/c.    Consultants:   Psychiatry  Procedures:     Antimicrobials:  Subjective: Patient seen sitting up in bed, does not want to engage in conversation, asked me to leave the room.  Patient says " I am very busy, I am too busy to talk to you".  Does not allow an examination.  Objective: Vitals:   07/04/20 2052 07/04/20 2357 07/05/20 1906 07/05/20 2021  BP:  (!) 154/107 (!) 153/91   Pulse:  101 101   Resp:  20 20   Temp:  98.9 F (37.2 C) 98.6 F (37  C)   TempSrc:  Oral Oral   SpO2: 98% 99% 98% 98%  Weight:      Height:        Intake/Output Summary (Last 24 hours) at 07/05/2020 2027 Last data filed at 07/05/2020 1300 Gross per 24 hour  Intake 480 ml  Output 500 ml  Net -20 ml   Filed Weights   07/03/20 1504 07/04/20 0200  Weight: 92.5 kg 97.3 kg    Examination:  General exam: Appears calm and comfortable  Central nervous system: No gross focal neurologic deficits.  He is able to ambulate without difficulty Psychiatry: Speech is often tangential, lacks insight into his medical conditions    Data Reviewed: I have personally reviewed following labs and imaging studies  CBC: Recent Labs  Lab 07/03/20 2016  WBC 3.1*  NEUTROABS 1.6*  HGB 12.8*  HCT 41.4  MCV 90.6  PLT 403*   Basic Metabolic Panel: Recent Labs  Lab 07/03/20 2016  NA 146*  K 4.1  CL 111  CO2 26  GLUCOSE 108*  BUN 29*  CREATININE 1.23  CALCIUM 10.0   GFR: Estimated Creatinine Clearance: 59.8 mL/min (by C-G formula based on SCr of 1.23 mg/dL). Liver Function Tests: Recent Labs  Lab 07/03/20 2016  AST 17  ALT 18  ALKPHOS 62  BILITOT 0.5  PROT 7.4  ALBUMIN 4.1   No results for input(s): LIPASE, AMYLASE in the last 168 hours. Recent Labs  Lab 07/03/20 2305  AMMONIA 14   Coagulation Profile: No results for input(s): INR, PROTIME in the last 168 hours. Cardiac Enzymes: No results for input(s): CKTOTAL, CKMB, CKMBINDEX, TROPONINI in the last 168 hours. BNP (last 3 results) No results for input(s): PROBNP in the last 8760 hours. HbA1C: No results for input(s): HGBA1C in the last 72 hours. CBG: No results for input(s): GLUCAP in the last 168 hours. Lipid Profile: No results for input(s): CHOL, HDL, LDLCALC, TRIG, CHOLHDL, LDLDIRECT in the last 72 hours. Thyroid Function Tests: Recent Labs    07/03/20 2305  TSH 2.802   Anemia Panel: No results for input(s): VITAMINB12, FOLATE, FERRITIN, TIBC, IRON, RETICCTPCT in the last 72  hours. Sepsis Labs: No results for input(s): PROCALCITON, LATICACIDVEN in the last 168 hours.  Recent Results (from the past 240 hour(s))  Urine Culture     Status: Abnormal (Preliminary result)   Collection Time: 07/03/20  3:10 PM   Specimen: Urine, Catheterized  Result Value Ref Range Status   Specimen Description   Final    URINE, CATHETERIZED Performed at [facility], [address] [place], [place] [address]    Special Requests   Final    NONE Performed at [facility], [address] [place], [place] [address]    Culture (A)  Final    >=100,000 COLONIES/mL ESCHERICHIA COLI SUSCEPTIBILITIES TO FOLLOW Performed at [place] [address] [place], [place] [address]    Report Status PENDING  Incomplete  SARS Coronavirus 2 by RT PCR (hospital  order, performed in [facility] hospital lab) Nasopharyngeal Nasopharyngeal Swab     Status: None   Collection Time: 07/03/20  8:24 PM   Specimen: Nasopharyngeal Swab  Result Value Ref Range Status   SARS Coronavirus 2 NEGATIVE NEGATIVE Final    Comment: (NOTE) SARS-CoV-2 target nucleic acids are NOT DETECTED.  The SARS-CoV-2 RNA is generally detectable in upper and lower respiratory specimens during the acute phase of infection. The lowest concentration of SARS-CoV-2 viral copies this assay can detect is 250 copies / mL. A negative result does not preclude SARS-CoV-2 infection and should not be used as the sole basis for treatment or other patient management decisions.  A negative result may occur with improper specimen collection / handling, submission of specimen other than nasopharyngeal swab, presence of viral mutation(s) within the areas targeted by this assay, and inadequate number of viral copies (<250 copies / mL). A negative result must be combined with clinical observations, patient history, and epidemiological information.  Fact Sheet for Patients:   StrictlyIdeas.no  Fact Sheet for  Healthcare Providers: BankingDealers.co.za  This test is not yet approved or  cleared by the [place] FDA and has been authorized for detection and/or diagnosis of SARS-CoV-2 by FDA under an Emergency Use Authorization (EUA).  This EUA will remain in effect (meaning this test can be used) for the duration of the COVID-19 declaration under Section 564(b)(1) of the Act, 21 U.S.C. section 360bbb-3(b)(1), unless the authorization is terminated or revoked sooner.  Performed at [facility], [address] [place], [place] [address]          Radiology Studies: CT Head Wo Contrast  Result Date: 07/03/2020 CLINICAL DATA:  79 year old male with altered mental status. EXAM: CT HEAD WITHOUT CONTRAST TECHNIQUE: Contiguous axial images were obtained from the base of the skull through the vertex without intravenous contrast. COMPARISON:  Head CT dated 06/05/2020. FINDINGS: Brain: Mild age-related atrophy and chronic microvascular ischemic changes. There is no acute intracranial hemorrhage. No mass effect or midline shift. No extra-axial fluid collection. Vascular: No hyperdense vessel or unexpected calcification. Skull: Several small sclerotic lesions in the parietal calvarium similar to prior CT, indeterminate. No acute calvarial pathology. Sinuses/Orbits: No acute finding. Other: None IMPRESSION: 1. No acute intracranial pathology. 2. Mild age-related atrophy and chronic microvascular ischemic changes. Electronically Signed   By: Anner Crete M.D.   On: 07/03/2020 22:11        Scheduled Meds: . amantadine  100 mg Oral BID  . amLODipine  5 mg Oral Daily  . FLUoxetine  40 mg Oral Daily  . haloperidol lactate  2 mg Intravenous TID  . heparin  5,000 Units Subcutaneous Q8H  . memantine  5 mg Oral QHS   Continuous Infusions: . levETIRAcetam    . valproate sodium       LOS: 1 day    Time spent: 21mins    Kathie Dike, MD [facility]   If  7PM-7AM, please contact night-coverage [email or web address]  07/05/2020, 8:27 PM

## 2020-07-05 NOTE — Progress Notes (Signed)
Pt became verbally abusive toward multiple staff members. Pt was aggressive in behavior in addition to being restless, drinking urine multiple times during the shift, and threatening staff. PRN medication was given via IM route

## 2020-07-06 DIAGNOSIS — G9341 Metabolic encephalopathy: Secondary | ICD-10-CM | POA: Diagnosis not present

## 2020-07-06 DIAGNOSIS — A5217 General paresis: Secondary | ICD-10-CM | POA: Diagnosis not present

## 2020-07-06 DIAGNOSIS — Z9114 Patient's other noncompliance with medication regimen: Secondary | ICD-10-CM | POA: Diagnosis not present

## 2020-07-06 DIAGNOSIS — F250 Schizoaffective disorder, bipolar type: Secondary | ICD-10-CM | POA: Diagnosis not present

## 2020-07-06 NOTE — Progress Notes (Signed)
Patient refusing for to me to perform shift assessment at this time. Pt refusing vital signs and medications. Pt stated "I don't need medicine, get the hell out." Pt lying in bed at this time. Safety sitter at door. Will continue to monitor. Midlevel made aware.

## 2020-07-06 NOTE — Progress Notes (Signed)
Unable to give 0730 Keppra IV due to patient refusing to let staff attempt IV placement. MD made aware.

## 2020-07-06 NOTE — Progress Notes (Signed)
PROGRESS NOTE    Henry Smith  OEU:[id number] DOB: 10/22/1941 DOA: 07/03/2020 PCP: Caprice Renshaw, MD    Brief Narrative:  79 year old male with a history of neurosyphilis, dementia, schizoaffective disorder, COPD, thrombocytopenia, seizure disorder, who is a long-term resident of a nursing facility, was brought to the hospital with progressive changes in mental status.  Patient had been coming increasingly aggressive, was refusing any medications, becoming paranoid.  More recently, staff had noted that he was drinking his urine.  He was evaluated in the emergency room and felt that he may have a urinary tract infection was admitted to the hospital service.  Further review of urine studies indicate that his positive urine culture is likely a colonization and not a true infection.  He did not have any other signs of active infection.  Discussed with infectious disease who agreed with not treating with antibiotics.  It was felt that his progressive decline in mental status was related to his underlying behavioral health.  Seen by psychiatry with recommendations for inpatient psychiatry.  He is involuntarily committed.  Patient is medically stable for discharge whenever he can be placed by psychiatry.   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 Progressive behavioral changes.  Initially felt to be related to possible UTI, but patient likely has colonization and not a true urinary tract infection.  He has been refusing his medications for weeks to months.  He is on several psychotropic medications such as Depakote, Haldol, Ativan.  He also takes Lamictal, Keppra for seizures.  His behavior changes appear to be chronic progressive in nature and appear to be secondary to his underlying mental  illness.  Other work-up including CT imaging of head, basic chemistry, ammonia, TSH, ABG were unrevealing.  Appreciate psychiatry input.  Recommendations are for inpatient psychiatry.  He is currently under involuntary commitment. 2. ESBL in urine.  Discussed with infectious disease, Dr. Drucilla Schmidt.  With no leukocytosis, fever, dysuria, unlikely to be an active infection.  Felt to be more related to colonization.  Antibiotics were not recommended in this situation. 3. Dementia associate with neurosyphilis.  Review of records indicate that he was treated for neurosyphilis in 2018.  He is chronically on Namenda which she has been refusing. 4. Thrombocytopenia.  Appears to be a chronic issue. 5. Seizure disorder.  Chronically on Keppra and Lamictal.  He has been refusing these medications.  He has IM Ativan as needed ordered if he has any breakthrough seizures. 6. Noncompliance.  Patient has been refusing medications for several weeks to months at this point.     DVT prophylaxis: heparin injection 5,000 Units Start: 07/04/20 0600 SCDs Start: 07/04/20 0130  Code Status: full code Family Communication: left VM for legal guardian Loa Socks 7/22 Disposition Plan: Status is: Inpatient  Remains inpatient appropriate because:Unsafe d/c plan   Dispo: The patient is from: SNF              Anticipated d/c is to: Inpatient psychiatry              Anticipated d/c date is: 1 day              Patient currently is medically stable to d/c.    Consultants:   Psychiatry  Procedures:     Antimicrobials:  Subjective: Patient is sitting up in bed.  He allows me to examine him today.  Refuses to take any medications by mouth or through an IV.  Denies any shortness of breath.  Objective: Vitals:   07/04/20 2357 07/05/20 1906 07/05/20 2021 07/06/20 0610  BP: (!) 154/107 (!) 153/91  (!) 135/85  Pulse: 101 101  85  Resp: 20 20  18   Temp: 98.9 F (37.2 C) 98.6 F (37 C)  98.5 F (36.9 C)    TempSrc: Oral Oral  Oral  SpO2: 99% 98% 98% 95%  Weight:      Height:        Intake/Output Summary (Last 24 hours) at 07/06/2020 1923 Last data filed at 07/06/2020 1700 Gross per 24 hour  Intake 240 ml  Output --  Net 240 ml   Filed Weights   07/03/20 1504 07/04/20 0200  Weight: 92.5 kg 97.3 kg    Examination:  General exam: Alert, awake, no distress Respiratory system: Clear to auscultation. Respiratory effort normal. Cardiovascular system:RRR. No murmurs, rubs, gallops. Gastrointestinal system: Abdomen is nondistended, soft and nontender. No organomegaly or masses felt. Normal bowel sounds heard. Central nervous system: Alert and oriented. No focal neurological deficits. Extremities: No C/C/E, +pedal pulses Skin: No rashes, lesions or ulcers Psychiatry: Speech is tangential, pressured at times, lacks insight into his medical problems     Data Reviewed: I have personally reviewed following labs and imaging studies  CBC: Recent Labs  Lab 07/03/20 2016  WBC 3.1*  NEUTROABS 1.6*  HGB 12.8*  HCT 41.4  MCV 90.6  PLT 413*   Basic Metabolic Panel: Recent Labs  Lab 07/03/20 2016  NA 146*  K 4.1  CL 111  CO2 26  GLUCOSE 108*  BUN 29*  CREATININE 1.23  CALCIUM 10.0   GFR: Estimated Creatinine Clearance: 59.8 mL/min (by C-G formula based on SCr of 1.23 mg/dL). Liver Function Tests: Recent Labs  Lab 07/03/20 2016  AST 17  ALT 18  ALKPHOS 62  BILITOT 0.5  PROT 7.4  ALBUMIN 4.1   No results for input(s): LIPASE, AMYLASE in the last 168 hours. Recent Labs  Lab 07/03/20 2305  AMMONIA 14   Coagulation Profile: No results for input(s): INR, PROTIME in the last 168 hours. Cardiac Enzymes: No results for input(s): CKTOTAL, CKMB, CKMBINDEX, TROPONINI in the last 168 hours. BNP (last 3 results) No results for input(s): PROBNP in the last 8760 hours. HbA1C: No results for input(s): HGBA1C in the last 72 hours. CBG: No results for input(s): GLUCAP in the  last 168 hours. Lipid Profile: No results for input(s): CHOL, HDL, LDLCALC, TRIG, CHOLHDL, LDLDIRECT in the last 72 hours. Thyroid Function Tests: Recent Labs    07/03/20 2305  TSH 2.802   Anemia Panel: No results for input(s): VITAMINB12, FOLATE, FERRITIN, TIBC, IRON, RETICCTPCT in the last 72 hours. Sepsis Labs: No results for input(s): PROCALCITON, LATICACIDVEN in the last 168 hours.  Recent Results (from the past 240 hour(s))  Urine Culture     Status: Abnormal (Preliminary result)   Collection Time: 07/03/20  3:10 PM   Specimen: Urine, Catheterized  Result Value Ref Range Status   Specimen Description   Final    URINE, CATHETERIZED Performed at [facility], [address], [place], [place] [address]    Special Requests   Final    NONE Performed at [facility], [address], [place],  [address]    Culture (A)  Final    >=100,000 COLONIES/mL ESCHERICHIA  COLI SUSCEPTIBILITIES TO FOLLOW REPEATING Performed at [facility], [address] [place], [place] [address]    Report Status PENDING  Incomplete  SARS Coronavirus 2 by RT PCR (hospital order, performed in [facility] hospital lab) Nasopharyngeal Nasopharyngeal Swab     Status: None   Collection Time: 07/03/20  8:24 PM   Specimen: Nasopharyngeal Swab  Result Value Ref Range Status   SARS Coronavirus 2 NEGATIVE NEGATIVE Final    Comment: (NOTE) SARS-CoV-2 target nucleic acids are NOT DETECTED.  The SARS-CoV-2 RNA is generally detectable in upper and lower respiratory specimens during the acute phase of infection. The lowest concentration of SARS-CoV-2 viral copies this assay can detect is 250 copies / mL. A negative result does not preclude SARS-CoV-2 infection and should not be used as the sole basis for treatment or other patient management decisions.  A negative result may occur with improper specimen collection / handling, submission of specimen other than nasopharyngeal swab, presence of viral  mutation(s) within the areas targeted by this assay, and inadequate number of viral copies (<250 copies / mL). A negative result must be combined with clinical observations, patient history, and epidemiological information.  Fact Sheet for Patients:   StrictlyIdeas.no  Fact Sheet for Healthcare Providers: BankingDealers.co.za  This test is not yet approved or  cleared by the [place] FDA and has been authorized for detection and/or diagnosis of SARS-CoV-2 by FDA under an Emergency Use Authorization (EUA).  This EUA will remain in effect (meaning this test can be used) for the duration of the COVID-19 declaration under Section 564(b)(1) of the Act, 21 U.S.C. section 360bbb-3(b)(1), unless the authorization is terminated or revoked sooner.  Performed at [facility], [address], [place], [place] [address]          Radiology Studies: No results found.      Scheduled Meds:  amantadine  100 mg Oral BID   amLODipine  5 mg Oral Daily   FLUoxetine  40 mg Oral Daily   haloperidol lactate  2 mg Intravenous TID   heparin  5,000 Units Subcutaneous Q8H   memantine  5 mg Oral QHS   Continuous Infusions:  levETIRAcetam     valproate sodium       LOS: 2 days    Time spent: 46mins    Kathie Dike, MD [facility]   If 7PM-7AM, please contact night-coverage [email or web address]  07/06/2020, 7:23 PM

## 2020-07-07 DIAGNOSIS — R569 Unspecified convulsions: Secondary | ICD-10-CM | POA: Diagnosis not present

## 2020-07-07 DIAGNOSIS — F250 Schizoaffective disorder, bipolar type: Secondary | ICD-10-CM | POA: Diagnosis not present

## 2020-07-07 DIAGNOSIS — Z9114 Patient's other noncompliance with medication regimen: Secondary | ICD-10-CM | POA: Diagnosis not present

## 2020-07-07 DIAGNOSIS — A5217 General paresis: Secondary | ICD-10-CM | POA: Diagnosis not present

## 2020-07-07 LAB — URINE CULTURE: Culture: >=100000 — AB

## 2020-07-07 MED ORDER — LAMOTRIGINE 100 MG PO TABS
50.0000 mg | ORAL_TABLET | Freq: Two times a day (BID) | ORAL | Status: DC
  Administered 2020-07-07 – 2020-08-08 (×37): 50 mg via ORAL
  Filled 2020-07-07 (×57): qty 1

## 2020-07-07 MED ORDER — HALOPERIDOL 2 MG PO TABS
2.0000 mg | ORAL_TABLET | Freq: Three times a day (TID) | ORAL | Status: DC
  Administered 2020-07-07 – 2020-09-10 (×118): 2 mg via ORAL
  Filled 2020-07-07 (×152): qty 1

## 2020-07-07 MED ORDER — DIVALPROEX SODIUM 125 MG PO CSDR
1000.0000 mg | DELAYED_RELEASE_CAPSULE | Freq: Two times a day (BID) | ORAL | Status: DC
  Administered 2020-07-07 – 2020-07-28 (×18): 1000 mg via ORAL
  Filled 2020-07-07 (×33): qty 8

## 2020-07-07 MED ORDER — LEVETIRACETAM 500 MG PO TABS
500.0000 mg | ORAL_TABLET | Freq: Two times a day (BID) | ORAL | Status: DC
  Administered 2020-07-07 – 2020-09-10 (×85): 500 mg via ORAL
  Filled 2020-07-07 (×111): qty 1

## 2020-07-07 NOTE — Progress Notes (Signed)
Patient meets criteria for inpatient treatment (geropsych). There are no appropriate beds at [facility]. CSW faxed referrals to the following facilities for review:  [facility]  TTS will continue to seek bed placement.   Maxie Better, MSW, LCSW Clinical Social Worker 07/07/2020 9:20 AM

## 2020-07-07 NOTE — Progress Notes (Signed)
Notified by safety sitter that pt was out in the hallway. Upon walking to room, pt was out in the hallway pacing. Pt stated that "all yall said you was gonna get in the bed with me." Reassured pt that no one is getting in his bed. Pt stated "you in the [employer], ain't you?" Pt reassured I am not in the [place]. Pt refusing to go into room. Psychiatric nurse. Security assisted patient to bed. Pt given Ativan 2mg  IM for agitation. Pt now lying in bed talking to self. Air cabin crew at doorway.

## 2020-07-07 NOTE — BH Assessment (Signed)
Behavioral Health Reassessment Note:  Patient continues to be disorganized , confused and demented. He has been delusional and agitated while on the medical floor and refusing to take his medication.  Continued inpatient geriatric psychiatric treatment is recommended.

## 2020-07-07 NOTE — Progress Notes (Signed)
PROGRESS NOTE    Henry Smith  NWG:[id number] DOB: 06-16-1941 DOA: 07/03/2020 PCP: Caprice Renshaw, MD    Brief Narrative:  79 year old male with a history of neurosyphilis, dementia, schizoaffective disorder, COPD, thrombocytopenia, seizure disorder, who is a long-term resident of a nursing facility, was brought to the hospital with progressive changes in mental status.  Patient had been coming increasingly aggressive, was refusing any medications, becoming paranoid.  More recently, staff had noted that he was drinking his urine.  He was evaluated in the emergency room and felt that he may have a urinary tract infection was admitted to the hospital service.  Further review of urine studies indicate that his positive urine culture is likely a colonization and not a true infection.  He did not have any other signs of active infection.  Discussed with infectious disease who agreed with not treating with antibiotics.  It was felt that his progressive decline in mental status was related to his underlying behavioral health.  Seen by psychiatry with recommendations for inpatient psychiatry.  He is involuntarily committed.  Patient is medically stable for discharge whenever he can be placed by psychiatry.   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 Progressive behavioral changes.  Initially felt to be related to possible UTI, but patient likely has colonization and not a true urinary tract infection.  He has been refusing his medications for weeks to months.  He is on several psychotropic medications such as Depakote, Haldol, Ativan.  He also takes Lamictal, Keppra for seizures.  His behavior changes appear to be chronic progressive in nature and appear to be secondary to his underlying mental  illness.  Other work-up including CT imaging of head, basic chemistry, ammonia, TSH, ABG were unrevealing.  Appreciate psychiatry input.  Recommendations are for inpatient psychiatry.  He is currently under involuntary commitment. 2. ESBL in urine.  Discussed with infectious disease, Dr. Drucilla Schmidt.  With no leukocytosis, fever, dysuria, unlikely to be an active infection.  Felt to be more related to colonization.  Antibiotics were not recommended in this situation. 3. Dementia associate with neurosyphilis.  Review of records indicate that he was treated for neurosyphilis in 2018.  He is chronically on Namenda which she has been refusing. 4. Thrombocytopenia.  Appears to be a chronic issue. 5. Seizure disorder.  Chronically on Keppra and Lamictal.  He has been refusing these medications.  He has IM Ativan as needed ordered if he has any breakthrough seizures. 6. Noncompliance.  Patient has been refusing medications for several weeks to months at this point.     DVT prophylaxis: heparin injection 5,000 Units Start: 07/04/20 0600 SCDs Start: 07/04/20 0130  Code Status: full code Family Communication: left VM for legal guardian Loa Socks 7/22 Disposition Plan: Status is: Inpatient  Remains inpatient appropriate because:Unsafe d/c plan   Dispo: The patient is from: SNF              Anticipated d/c is to: Inpatient psychiatry              Anticipated d/c date is: 1 day              Patient currently is medically stable to d/c.    Consultants:   Psychiatry  Procedures:     Antimicrobials:  Subjective: Patient is sitting up in a chair.  He is eating lunch.  Denies any shortness of breath or any other complaints.  Says he is willing to take some medications by mouth today only if they are name brand and not generic  Objective: Vitals:   07/05/20 1906 07/05/20 2021 07/06/20 0610 07/07/20 0910  BP: (!) 153/91  (!) 135/85 (!) 154/92  Pulse: 101  85   Resp: 20  18   Temp:  98.6 F (37 C)  98.5 F (36.9 C)   TempSrc: Oral  Oral   SpO2: 98% 98% 95%   Weight:      Height:        Intake/Output Summary (Last 24 hours) at 07/07/2020 1857 Last data filed at 07/07/2020 1700 Gross per 24 hour  Intake 720 ml  Output --  Net 720 ml   Filed Weights   07/03/20 1504 07/04/20 0200  Weight: 92.5 kg 97.3 kg    Examination:  General exam: Alert, awake, no distress Respiratory system: Clear to auscultation. Respiratory effort normal. Cardiovascular system:RRR. No murmurs, rubs, gallops. Gastrointestinal system: Abdomen is nondistended, soft and nontender. No organomegaly or masses felt. Normal bowel sounds heard. Central nervous system: No focal neurological deficits. Extremities: No C/C/E, +pedal pulses Skin: No rashes, lesions or ulcers Psychiatry: Speech remains tangential  Data Reviewed: I have personally reviewed following labs and imaging studies  CBC: Recent Labs  Lab 07/03/20 2016  WBC 3.1*  NEUTROABS 1.6*  HGB 12.8*  HCT 41.4  MCV 90.6  PLT 762*   Basic Metabolic Panel: Recent Labs  Lab 07/03/20 2016  NA 146*  K 4.1  CL 111  CO2 26  GLUCOSE 108*  BUN 29*  CREATININE 1.23  CALCIUM 10.0   GFR: Estimated Creatinine Clearance: 59.8 mL/min (by C-G formula based on SCr of 1.23 mg/dL). Liver Function Tests: Recent Labs  Lab 07/03/20 2016  AST 17  ALT 18  ALKPHOS 62  BILITOT 0.5  PROT 7.4  ALBUMIN 4.1   No results for input(s): LIPASE, AMYLASE in the last 168 hours. Recent Labs  Lab 07/03/20 2305  AMMONIA 14   Coagulation Profile: No results for input(s): INR, PROTIME in the last 168 hours. Cardiac Enzymes: No results for input(s): CKTOTAL, CKMB, CKMBINDEX, TROPONINI in the last 168 hours. BNP (last 3 results) No results for input(s): PROBNP in the last 8760 hours. HbA1C: No results for input(s): HGBA1C in the last 72 hours. CBG: No results for input(s): GLUCAP in the last 168 hours. Lipid Profile: No results for  input(s): CHOL, HDL, LDLCALC, TRIG, CHOLHDL, LDLDIRECT in the last 72 hours. Thyroid Function Tests: No results for input(s): TSH, T4TOTAL, FREET4, T3FREE, THYROIDAB in the last 72 hours. Anemia Panel: No results for input(s): VITAMINB12, FOLATE, FERRITIN, TIBC, IRON, RETICCTPCT in the last 72 hours. Sepsis Labs: No results for input(s): PROCALCITON, LATICACIDVEN in the last 168 hours.  Recent Results (from the past 240 hour(s))  Urine Culture     Status: Abnormal   Collection Time: 07/03/20  3:10 PM   Specimen: Urine, Catheterized  Result Value Ref Range Status   Specimen Description   Final    URINE, CATHETERIZED Performed at [facility], [address] [place], [place] [address]    Special Requests   Final    NONE Performed at [facility], [address], [place], [place] [address]    Culture (A)  Final    >=100,000 COLONIES/mL ESCHERICHIA COLI Confirmed Extended Spectrum Beta-Lactamase Producer (ESBL).  In bloodstream infections from ESBL organisms, carbapenems are preferred over piperacillin/tazobactam. They are shown to have a lower risk of mortality.    Report Status 07/07/2020 FINAL  Final   Organism ID, Bacteria ESCHERICHIA COLI (A)  Final      Susceptibility   Escherichia coli - MIC*    AMPICILLIN >=32 RESISTANT Resistant     CEFAZOLIN >=64 RESISTANT Resistant     CEFTRIAXONE >=64 RESISTANT Resistant     CIPROFLOXACIN >=4 RESISTANT Resistant     GENTAMICIN <=1 SENSITIVE Sensitive     IMIPENEM <=0.25 SENSITIVE Sensitive     NITROFURANTOIN <=16 SENSITIVE Sensitive     TRIMETH/SULFA >=320 RESISTANT Resistant     AMPICILLIN/SULBACTAM >=32 RESISTANT Resistant     PIP/TAZO >=128 RESISTANT Resistant     * >=100,000 COLONIES/mL ESCHERICHIA COLI  SARS Coronavirus 2 by RT PCR (hospital order, performed in [facility] hospital lab) Nasopharyngeal Nasopharyngeal Swab     Status: None   Collection Time: 07/03/20  8:24 PM   Specimen: Nasopharyngeal Swab  Result Value Ref Range  Status   SARS Coronavirus 2 NEGATIVE NEGATIVE Final    Comment: (NOTE) SARS-CoV-2 target nucleic acids are NOT DETECTED.  The SARS-CoV-2 RNA is generally detectable in upper and lower respiratory specimens during the acute phase of infection. The lowest concentration of SARS-CoV-2 viral copies this assay can detect is 250 copies / mL. A negative result does not preclude SARS-CoV-2 infection and should not be used as the sole basis for treatment or other patient management decisions.  A negative result may occur with improper specimen collection / handling, submission of specimen other than nasopharyngeal swab, presence of viral mutation(s) within the areas targeted by this assay, and inadequate number of viral copies (<250 copies / mL). A negative result must be combined with clinical observations, patient history, and epidemiological information.  Fact Sheet for Patients:   StrictlyIdeas.no  Fact Sheet for Healthcare Providers: BankingDealers.co.za  This test is not yet approved or  cleared by the [place] FDA and has been authorized for detection and/or diagnosis of SARS-CoV-2 by FDA under an Emergency Use Authorization (EUA).  This EUA will remain in effect (meaning this test can be used) for the duration of the COVID-19 declaration under Section 564(b)(1) of the Act, 21 U.S.C. section 360bbb-3(b)(1), unless the authorization is terminated or revoked sooner.  Performed at [facility], [address] [place], [place] [address]          Radiology Studies: No results found.      Scheduled Meds: . amantadine  100 mg Oral BID  . amLODipine  5 mg Oral Daily  . divalproex  1,000 mg Oral Q12H  . FLUoxetine  40 mg Oral Daily  . haloperidol  2 mg Oral TID  . heparin  5,000 Units Subcutaneous Q8H  . lamoTRIgine  50 mg Oral BID  . levETIRAcetam  500 mg Oral BID  . memantine  5 mg Oral QHS   Continuous Infusions:     LOS: 3 days    Time spent: 43mins    Kathie Dike, MD [facility]   If 7PM-7AM, please contact night-coverage [email or web address]  07/07/2020, 6:57 PM

## 2020-07-07 NOTE — Progress Notes (Signed)
CSW faxed updated labs/COVID test result to [facility] per request of admissions/intake office.  Anjoli Diemer S. Ouida Sills, MSW, LCSW Clinical Social Worker 07/07/2020 10:50 AM

## 2020-07-08 DIAGNOSIS — F250 Schizoaffective disorder, bipolar type: Secondary | ICD-10-CM | POA: Diagnosis not present

## 2020-07-08 DIAGNOSIS — A5217 General paresis: Secondary | ICD-10-CM | POA: Diagnosis not present

## 2020-07-08 DIAGNOSIS — Z9114 Patient's other noncompliance with medication regimen: Secondary | ICD-10-CM | POA: Diagnosis not present

## 2020-07-08 DIAGNOSIS — R569 Unspecified convulsions: Secondary | ICD-10-CM | POA: Diagnosis not present

## 2020-07-08 LAB — LEVETIRACETAM LEVEL: Levetiracetam Lvl: <1 ug/mL — ABNORMAL LOW (ref 10.0–40.0)

## 2020-07-08 NOTE — Progress Notes (Signed)
Patient refusing medications and for vital signs to be obtained. Patient lying in bed at this time.

## 2020-07-08 NOTE — Progress Notes (Signed)
PROGRESS NOTE    Henry Smith  GYI:[id number] DOB: November 25, 1941 DOA: 07/03/2020 PCP: Caprice Renshaw, MD    Brief Narrative:  79 year old male with a history of neurosyphilis, dementia, schizoaffective disorder, COPD, thrombocytopenia, seizure disorder, who is a long-term resident of a nursing facility, was brought to the hospital with progressive changes in mental status.  Patient had been coming increasingly aggressive, was refusing any medications, becoming paranoid.  More recently, staff had noted that he was drinking his urine.  He was evaluated in the emergency room and felt that he may have a urinary tract infection was admitted to the hospital service.  Further review of urine studies indicate that his positive urine culture is likely a colonization and not a true infection.  He did not have any other signs of active infection.  Discussed with infectious disease who agreed with not treating with antibiotics.  It was felt that his progressive decline in mental status was related to his underlying behavioral health.  Seen by psychiatry with recommendations for inpatient psychiatry.  He is involuntarily committed.  Patient is medically stable for discharge whenever he can be placed by psychiatry.   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 Progressive behavioral changes.  Initially felt to be related to possible UTI, but patient likely has colonization and not a true urinary tract infection.  He has been refusing his medications for weeks to months.  He is on several psychotropic medications such as Depakote, Haldol, Ativan.  He also takes Lamictal, Keppra for seizures.  His behavior changes appear to be chronic progressive in nature and appear to be secondary to his underlying mental  illness.  Other work-up including CT imaging of head, basic chemistry, ammonia, TSH, ABG were unrevealing.  Appreciate psychiatry input.  Recommendations are for inpatient psychiatry.  He is currently under involuntary commitment. 2. ESBL in urine.  Discussed with infectious disease, Dr. Drucilla Schmidt.  With no leukocytosis, fever, dysuria, unlikely to be an active infection.  Felt to be more related to colonization.  Antibiotics were not recommended in this situation. 3. Dementia associate with neurosyphilis.  Review of records indicate that he was treated for neurosyphilis in 2018.  He is chronically on Namenda which he has been refusing. 4. Thrombocytopenia.  Appears to be a chronic issue. 5. Seizure disorder.  Chronically on Keppra and Lamictal.  He has been refusing these medications.  He has IM Ativan as needed ordered if he has any breakthrough seizures. 6. Noncompliance.  Patient has been refusing medications for several weeks to months at this point.     DVT prophylaxis: heparin injection 5,000 Units Start: 07/04/20 0600 SCDs Start: 07/04/20 0130  Code Status: full code Family Communication: left VM for legal guardian Loa Socks 7/22 Disposition Plan: Status is: Inpatient  Remains inpatient appropriate because:Unsafe d/c plan   Dispo: The patient is from: SNF              Anticipated d/c is to: Inpatient psychiatry              Anticipated d/c date is: 1 day              Patient currently is medically stable to d/c.    Consultants:   Psychiatry  Procedures:     Antimicrobials:  Subjective: Sitting up in chair today.  Refuses to take medications.  Objective: Vitals:   07/05/20 2021 07/06/20 0610 07/07/20 0910 07/08/20 1005  BP:  (!) 135/85 (!) 154/92 (!) 148/78  Pulse:  85    Resp:  18    Temp:  98.5 F (36.9 C)    TempSrc:  Oral    SpO2: 98% 95%    Weight:      Height:        Intake/Output Summary (Last 24 hours) at 07/08/2020 2111 Last data filed at  07/08/2020 1851 Gross per 24 hour  Intake 2280 ml  Output --  Net 2280 ml   Filed Weights   07/03/20 1504 07/04/20 0200  Weight: 92.5 kg 97.3 kg    Examination:  General exam: Alert, awake, no distress Respiratory system: Clear to auscultation. Respiratory effort normal. Cardiovascular system:RRR. No murmurs, rubs, gallops. Gastrointestinal system: Abdomen is nondistended, soft and nontender. No organomegaly or masses felt. Normal bowel sounds heard. Central nervous system: No focal neurological deficits. Extremities: No C/C/E, +pedal pulses Skin: No rashes, lesions or ulcers Psychiatry: Calm, refuses to take medications   Data Reviewed: I have personally reviewed following labs and imaging studies  CBC: Recent Labs  Lab 07/03/20 2016  WBC 3.1*  NEUTROABS 1.6*  HGB 12.8*  HCT 41.4  MCV 90.6  PLT 967*   Basic Metabolic Panel: Recent Labs  Lab 07/03/20 2016  NA 146*  K 4.1  CL 111  CO2 26  GLUCOSE 108*  BUN 29*  CREATININE 1.23  CALCIUM 10.0   GFR: Estimated Creatinine Clearance: 59.8 mL/min (by C-G formula based on SCr of 1.23 mg/dL). Liver Function Tests: Recent Labs  Lab 07/03/20 2016  AST 17  ALT 18  ALKPHOS 62  BILITOT 0.5  PROT 7.4  ALBUMIN 4.1   No results for input(s): LIPASE, AMYLASE in the last 168 hours. Recent Labs  Lab 07/03/20 2305  AMMONIA 14   Coagulation Profile: No results for input(s): INR, PROTIME in the last 168 hours. Cardiac Enzymes: No results for input(s): CKTOTAL, CKMB, CKMBINDEX, TROPONINI in the last 168 hours. BNP (last 3 results) No results for input(s): PROBNP in the last 8760 hours. HbA1C: No results for input(s): HGBA1C in the last 72 hours. CBG: No results for input(s): GLUCAP in the last 168 hours. Lipid Profile: No results for input(s): CHOL, HDL, LDLCALC, TRIG, CHOLHDL, LDLDIRECT in the last 72 hours. Thyroid Function Tests: No results for input(s): TSH, T4TOTAL, FREET4, T3FREE, THYROIDAB in the last 72  hours. Anemia Panel: No results for input(s): VITAMINB12, FOLATE, FERRITIN, TIBC, IRON, RETICCTPCT in the last 72 hours. Sepsis Labs: No results for input(s): PROCALCITON, LATICACIDVEN in the last 168 hours.  Recent Results (from the past 240 hour(s))  Urine Culture     Status: Abnormal   Collection Time: 07/03/20  3:10 PM   Specimen: Urine, Catheterized  Result Value Ref Range Status   Specimen Description   Final    URINE, CATHETERIZED Performed at [facility], [address], [place], [place] [address]    Special Requests   Final    NONE Performed at [facility], [address], [place], [place] [address]    Culture (A)  Final    >=100,000 COLONIES/mL ESCHERICHIA COLI Confirmed Extended Spectrum Beta-Lactamase Producer (ESBL).  In bloodstream infections from ESBL organisms, carbapenems are preferred over piperacillin/tazobactam. They are shown to have a lower risk of mortality.    Report Status 07/07/2020 FINAL  Final   Organism  ID, Bacteria ESCHERICHIA COLI (A)  Final      Susceptibility   Escherichia coli - MIC*    AMPICILLIN >=32 RESISTANT Resistant     CEFAZOLIN >=64 RESISTANT Resistant     CEFTRIAXONE >=64 RESISTANT Resistant     CIPROFLOXACIN >=4 RESISTANT Resistant     GENTAMICIN <=1 SENSITIVE Sensitive     IMIPENEM <=0.25 SENSITIVE Sensitive     NITROFURANTOIN <=16 SENSITIVE Sensitive     TRIMETH/SULFA >=320 RESISTANT Resistant     AMPICILLIN/SULBACTAM >=32 RESISTANT Resistant     PIP/TAZO >=128 RESISTANT Resistant     * >=100,000 COLONIES/mL ESCHERICHIA COLI  SARS Coronavirus 2 by RT PCR (hospital order, performed in [facility] hospital lab) Nasopharyngeal Nasopharyngeal Swab     Status: None   Collection Time: 07/03/20  8:24 PM   Specimen: Nasopharyngeal Swab  Result Value Ref Range Status   SARS Coronavirus 2 NEGATIVE NEGATIVE Final    Comment: (NOTE) SARS-CoV-2 target nucleic acids are NOT DETECTED.  The SARS-CoV-2 RNA is generally detectable in upper and  lower respiratory specimens during the acute phase of infection. The lowest concentration of SARS-CoV-2 viral copies this assay can detect is 250 copies / mL. A negative result does not preclude SARS-CoV-2 infection and should not be used as the sole basis for treatment or other patient management decisions.  A negative result may occur with improper specimen collection / handling, submission of specimen other than nasopharyngeal swab, presence of viral mutation(s) within the areas targeted by this assay, and inadequate number of viral copies (<250 copies / mL). A negative result must be combined with clinical observations, patient history, and epidemiological information.  Fact Sheet for Patients:   StrictlyIdeas.no  Fact Sheet for Healthcare Providers: BankingDealers.co.za  This test is not yet approved or  cleared by the [place] FDA and has been authorized for detection and/or diagnosis of SARS-CoV-2 by FDA under an Emergency Use Authorization (EUA).  This EUA will remain in effect (meaning this test can be used) for the duration of the COVID-19 declaration under Section 564(b)(1) of the Act, 21 U.S.C. section 360bbb-3(b)(1), unless the authorization is terminated or revoked sooner.  Performed at [facility], [address], [place], [place] [address]          Radiology Studies: No results found.      Scheduled Meds:  amantadine  100 mg Oral BID   amLODipine  5 mg Oral Daily   divalproex  1,000 mg Oral Q12H   FLUoxetine  40 mg Oral Daily   haloperidol  2 mg Oral TID   heparin  5,000 Units Subcutaneous Q8H   lamoTRIgine  50 mg Oral BID   levETIRAcetam  500 mg Oral BID   memantine  5 mg Oral QHS   Continuous Infusions:    LOS: 4 days    Time spent: 82mins    Kathie Dike, MD [facility]   If 7PM-7AM, please contact night-coverage [email or web address]  07/08/2020, 9:11 PM

## 2020-07-08 NOTE — BH Assessment (Signed)
Attempted to reassess pt.  Unable to reach the RN after several attempts and assistance from operator.  TTS to attempt to reassess patient at a later time.   Per chart review, it appears patient continues to present with confusion and he is still refusing medications.

## 2020-07-09 DIAGNOSIS — Z9114 Patient's other noncompliance with medication regimen: Secondary | ICD-10-CM | POA: Diagnosis not present

## 2020-07-09 DIAGNOSIS — R569 Unspecified convulsions: Secondary | ICD-10-CM | POA: Diagnosis not present

## 2020-07-09 DIAGNOSIS — F250 Schizoaffective disorder, bipolar type: Secondary | ICD-10-CM | POA: Diagnosis not present

## 2020-07-09 DIAGNOSIS — A5217 General paresis: Secondary | ICD-10-CM | POA: Diagnosis not present

## 2020-07-09 NOTE — Progress Notes (Signed)
PROGRESS NOTE    Henry Smith  MAU:[id number] DOB: 02-Apr-1941 DOA: 07/03/2020 PCP: Caprice Renshaw, MD    Brief Narrative:  79 year old male with a history of neurosyphilis, dementia, schizoaffective disorder, COPD, thrombocytopenia, seizure disorder, who is a long-term resident of a nursing facility, was brought to the hospital with progressive changes in mental status.  Patient had been coming increasingly aggressive, was refusing any medications, becoming paranoid.  More recently, staff had noted that he was drinking his urine.  He was evaluated in the emergency room and felt that he may have a urinary tract infection was admitted to the hospital service.  Further review of urine studies indicate that his positive urine culture is likely a colonization and not a true infection.  He did not have any other signs of active infection.  Discussed with infectious disease who agreed with not treating with antibiotics.  It was felt that his progressive decline in mental status was related to his underlying behavioral health.  Seen by psychiatry with recommendations for inpatient psychiatry.  He is involuntarily committed.  Patient is medically stable for discharge whenever he can be placed by psychiatry.   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 Progressive behavioral changes.  Initially felt to be related to possible UTI, but patient likely has colonization and not a true urinary tract infection.  He has been refusing his medications for weeks to months.  He is on several psychotropic medications such as Depakote, Haldol, Ativan.  He also takes Lamictal, Keppra for seizures.  His behavior changes appear to be chronic progressive in nature and appear to be secondary to his underlying mental  illness.  Other work-up including CT imaging of head, basic chemistry, ammonia, TSH, ABG were unrevealing.  Appreciate psychiatry input.  Recommendations are for inpatient psychiatry.  He is currently under involuntary commitment. 2. ESBL in urine.  Discussed with infectious disease, Dr. Drucilla Schmidt.  With no leukocytosis, fever, dysuria, unlikely to be an active infection.  Felt to be more related to colonization.  Antibiotics were not recommended in this situation. 3. Dementia associate with neurosyphilis.  Review of records indicate that he was treated for neurosyphilis in 2018.  He is chronically on Namenda which he has been refusing. 4. Thrombocytopenia.  Appears to be a chronic issue. 5. Seizure disorder.  Chronically on Keppra and Lamictal.  He has been refusing these medications.  He has IM Ativan as needed ordered if he has any breakthrough seizures. 6. Noncompliance.  Patient has been refusing medications for several weeks to months at this point.     DVT prophylaxis: heparin injection 5,000 Units Start: 07/04/20 0600 SCDs Start: 07/04/20 0130  Code Status: full code Family Communication: left VM for legal guardian Loa Socks 7/22 Disposition Plan: Status is: Inpatient  Remains inpatient appropriate because:Unsafe d/c plan   Dispo: The patient is from: SNF              Anticipated d/c is to: Inpatient psychiatry              Anticipated d/c date is: 1 day              Patient currently is medically stable to d/c.    Consultants:   Psychiatry  Procedures:     Antimicrobials:  Subjective: Denies any complaints. No chest pain or shortness of breath. Refusing medications  Objective: Vitals:   07/06/20 0610 07/07/20 0910 07/08/20 1005 07/09/20 2020  BP:  (!) 154/92 (!) 148/78 (!) 155/76  Pulse: 85   81  Resp: 18   20  Temp: 98.5 F (36.9 C)   98.2 F (36.8 C)  TempSrc: Oral     SpO2: 95%   99%  Weight:      Height:        Intake/Output Summary (Last 24  hours) at 07/09/2020 2102 Last data filed at 07/08/2020 2204 Gross per 24 hour  Intake 480 ml  Output --  Net 480 ml   Filed Weights   07/03/20 1504 07/04/20 0200  Weight: 92.5 kg 97.3 kg    Examination:  General exam: Alert, awake, oriented x 3 Respiratory system: Clear to auscultation. Respiratory effort normal. Cardiovascular system:RRR. No murmurs, rubs, gallops.  Data Reviewed: I have personally reviewed following labs and imaging studies  CBC: Recent Labs  Lab 07/03/20 2016  WBC 3.1*  NEUTROABS 1.6*  HGB 12.8*  HCT 41.4  MCV 90.6  PLT 160*   Basic Metabolic Panel: Recent Labs  Lab 07/03/20 2016  NA 146*  K 4.1  CL 111  CO2 26  GLUCOSE 108*  BUN 29*  CREATININE 1.23  CALCIUM 10.0   GFR: Estimated Creatinine Clearance: 59.8 mL/min (by C-G formula based on SCr of 1.23 mg/dL). Liver Function Tests: Recent Labs  Lab 07/03/20 2016  AST 17  ALT 18  ALKPHOS 62  BILITOT 0.5  PROT 7.4  ALBUMIN 4.1   No results for input(s): LIPASE, AMYLASE in the last 168 hours. Recent Labs  Lab 07/03/20 2305  AMMONIA 14   Coagulation Profile: No results for input(s): INR, PROTIME in the last 168 hours. Cardiac Enzymes: No results for input(s): CKTOTAL, CKMB, CKMBINDEX, TROPONINI in the last 168 hours. BNP (last 3 results) No results for input(s): PROBNP in the last 8760 hours. HbA1C: No results for input(s): HGBA1C in the last 72 hours. CBG: No results for input(s): GLUCAP in the last 168 hours. Lipid Profile: No results for input(s): CHOL, HDL, LDLCALC, TRIG, CHOLHDL, LDLDIRECT in the last 72 hours. Thyroid Function Tests: No results for input(s): TSH, T4TOTAL, FREET4, T3FREE, THYROIDAB in the last 72 hours. Anemia Panel: No results for input(s): VITAMINB12, FOLATE, FERRITIN, TIBC, IRON, RETICCTPCT in the last 72 hours. Sepsis Labs: No results for input(s): PROCALCITON, LATICACIDVEN in the last 168 hours.  Recent Results (from the past 240 hour(s))  Urine  Culture     Status: Abnormal   Collection Time: 07/03/20  3:10 PM   Specimen: Urine, Catheterized  Result Value Ref Range Status   Specimen Description   Final    URINE, CATHETERIZED Performed at [facility], [address] [place], [place] [address]    Special Requests   Final    NONE Performed at [facility], [address], [place],  [address]    Culture (A)  Final    >=100,000 COLONIES/mL ESCHERICHIA COLI Confirmed Extended Spectrum Beta-Lactamase Producer (ESBL).  In bloodstream infections from ESBL organisms, carbapenems are preferred over piperacillin/tazobactam. They are shown to have a lower risk of mortality.    Report Status 07/07/2020 FINAL  Final   Organism ID, Bacteria ESCHERICHIA COLI (A)  Final      Susceptibility   Escherichia coli - MIC*    AMPICILLIN >=32 RESISTANT Resistant     CEFAZOLIN >=64 RESISTANT Resistant  CEFTRIAXONE >=64 RESISTANT Resistant     CIPROFLOXACIN >=4 RESISTANT Resistant     GENTAMICIN <=1 SENSITIVE Sensitive     IMIPENEM <=0.25 SENSITIVE Sensitive     NITROFURANTOIN <=16 SENSITIVE Sensitive     TRIMETH/SULFA >=320 RESISTANT Resistant     AMPICILLIN/SULBACTAM >=32 RESISTANT Resistant     PIP/TAZO >=128 RESISTANT Resistant     * >=100,000 COLONIES/mL ESCHERICHIA COLI  SARS Coronavirus 2 by RT PCR (hospital order, performed in [facility] hospital lab) Nasopharyngeal Nasopharyngeal Swab     Status: None   Collection Time: 07/03/20  8:24 PM   Specimen: Nasopharyngeal Swab  Result Value Ref Range Status   SARS Coronavirus 2 NEGATIVE NEGATIVE Final    Comment: (NOTE) SARS-CoV-2 target nucleic acids are NOT DETECTED.  The SARS-CoV-2 RNA is generally detectable in upper and lower respiratory specimens during the acute phase of infection. The lowest concentration of SARS-CoV-2 viral copies this assay can detect is 250 copies / mL. A negative result does not preclude SARS-CoV-2 infection and should not be used as the sole basis for  treatment or other patient management decisions.  A negative result may occur with improper specimen collection / handling, submission of specimen other than nasopharyngeal swab, presence of viral mutation(s) within the areas targeted by this assay, and inadequate number of viral copies (<250 copies / mL). A negative result must be combined with clinical observations, patient history, and epidemiological information.  Fact Sheet for Patients:   StrictlyIdeas.no  Fact Sheet for Healthcare Providers: BankingDealers.co.za  This test is not yet approved or  cleared by the [place] FDA and has been authorized for detection and/or diagnosis of SARS-CoV-2 by FDA under an Emergency Use Authorization (EUA).  This EUA will remain in effect (meaning this test can be used) for the duration of the COVID-19 declaration under Section 564(b)(1) of the Act, 21 U.S.C. section 360bbb-3(b)(1), unless the authorization is terminated or revoked sooner.  Performed at [facility], [address] [place], [place] [address]          Radiology Studies: No results found.      Scheduled Meds: . amantadine  100 mg Oral BID  . amLODipine  5 mg Oral Daily  . divalproex  1,000 mg Oral Q12H  . FLUoxetine  40 mg Oral Daily  . haloperidol  2 mg Oral TID  . heparin  5,000 Units Subcutaneous Q8H  . lamoTRIgine  50 mg Oral BID  . levETIRAcetam  500 mg Oral BID  . memantine  5 mg Oral QHS   Continuous Infusions:    LOS: 5 days    Time spent: 38mins    Kathie Dike, MD [facility]   If 7PM-7AM, please contact night-coverage [email or web address]  07/09/2020, 9:02 PM

## 2020-07-09 NOTE — BH Assessment (Signed)
Reassessment Note:   Upon assessment today, patient is disoriented and unable to give his date of birth or current location.  He refused to answer further questions and would only shake his head, no, to every other question.  At one point, he proceeded to [facility] with is finger in his mouth and responses were undecipherable.  He couldn't recall what he had just said, but began to say he wants to go home and [place].  LPC observed patient taking morning medications appropriately.  However, he then proceeded to get up to "take a piss" and he began drinking his urine. RN attempted to redirect, however he continued to drink urine and requested coffee.    Patient continues to meet criteria for inpatient geri-psych treatment.  TTS will continue to pursue appropriate programs.

## 2020-07-09 NOTE — BH Assessment (Signed)
Patient is to be re-assessed by TTS providers on 07/09/20.   At this time, patient continues to meet inpatient criteria for gero-psych services.   Patient continues to be under review at the following facilities:  [facility] - currently reviewing  [facility] - currently reviewing, no updates at this time [facility] - left voicemail, no answer at this time [facility] -  left voicemail, no answer at this time [facility] - left voicemail, no answer at this time [facility] - left voicemail, no answer at this time [facility] - at capacity on 7/27, requested a call back [facility] - at capacity on 7/27, requested a call back    Patient was DENIED for services at the following facilities:  [facility] - declined due to medical concerns  [facility] - declined due to noncompliance, and being too medically complex. [facility] closed due to re-construction    TTS/Disposition CSW will continue to follow for possible placement.    Radonna Ricker, MSW, LCSW Clinical Social Worker [place]

## 2020-07-09 NOTE — Plan of Care (Signed)
  Problem: Education: Goal: Knowledge of General Education information will improve Description: Including pain rating scale, medication(s)/side effects and non-pharmacologic comfort measures Outcome: Not Progressing   Problem: Health Behavior/Discharge Planning: Goal: Ability to manage health-related needs will improve Outcome: Not Progressing   

## 2020-07-10 DIAGNOSIS — A5217 General paresis: Secondary | ICD-10-CM | POA: Diagnosis not present

## 2020-07-10 DIAGNOSIS — R569 Unspecified convulsions: Secondary | ICD-10-CM | POA: Diagnosis not present

## 2020-07-10 DIAGNOSIS — Z9114 Patient's other noncompliance with medication regimen: Secondary | ICD-10-CM | POA: Diagnosis not present

## 2020-07-10 DIAGNOSIS — F250 Schizoaffective disorder, bipolar type: Secondary | ICD-10-CM | POA: Diagnosis not present

## 2020-07-10 NOTE — Progress Notes (Signed)
PROGRESS NOTE    Henry Smith  ZOX:[id number] DOB: [birth year] DOA: 07/03/2020 PCP: Caprice Renshaw, MD    Brief Narrative:  79 year old male with a history of neurosyphilis, dementia, schizoaffective disorder, COPD, thrombocytopenia, seizure disorder, who is a long-term resident of a nursing facility, was brought to the hospital with progressive changes in mental status.  Patient had been coming increasingly aggressive, was refusing any medications, becoming paranoid.  More recently, staff had noted that he was drinking his urine.  He was evaluated in the emergency room and felt that he may have a urinary tract infection was admitted to the hospital service.  Further review of urine studies indicate that his positive urine culture is likely a colonization and not a true infection.  He did not have any other signs of active infection.  Discussed with infectious disease who agreed with not treating with antibiotics.  It was felt that his progressive decline in mental status was related to his underlying behavioral health.  Seen by psychiatry with recommendations for inpatient psychiatry.  He is involuntarily committed.  Patient is medically stable for discharge whenever he can be placed by psychiatry. - -Patient is medically stable for discharge to inpatient psychiatric/behavioral unit when bed becomes available   Assessment & Plan:   Active Problems:   Schizoaffective disorder, bipolar type (HCC)   Thrombocytopenia (HCC)   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HCC)   Seizure ([place])   Noncompliance with medication regimen   Acute metabolic encephalopathy   Progressive behavioral changes.  Initially felt to be related to possible UTI, but patient likely has colonization and not a true urinary tract infection.  He has been refusing his medications for weeks to months.  He is on several psychotropic medications such as Depakote, Haldol, Ativan.  He also takes Lamictal, Keppra for seizures.  His  behavior changes appear to be chronic progressive in nature and appear to be secondary to his underlying mental illness.  Other work-up including CT imaging of head, basic chemistry, ammonia, TSH, ABG were unrevealing.  Appreciate psychiatry input.  Recommendations are for inpatient psychiatry.  He is currently under involuntary commitment.---- -Patient is medically stable for discharge to inpatient psychiatric/behavioral unit when bed becomes available  2)ESBL in urine.  Discussed with infectious disease, Dr. Drucilla Schmidt.  With no leukocytosis, fever, dysuria, unlikely to be an active infection.  Felt to be more related to colonization.  Antibiotics were not recommended in this situation.  3)Dementia associate with neurosyphilis.  Review of records indicate that he was treated for neurosyphilis in 2018.  He is chronically on Namenda which he has been refusing.  4)Thrombocytopenia.  Appears to be a chronic issue.  5)Seizure disorder.  Chronically on Keppra and Lamictal.  He has been refusing these medications.  He has IM Ativan as needed ordered if he has any breakthrough seizures.  6)Noncompliance.  Patient has been refusing medications for several weeks to months at this point.     DVT prophylaxis: heparin injection 5,000 Units Start: 07/04/20 0600 SCDs Start: 07/04/20 0130  Code Status: full code Family Communication: left VM for legal guardian Loa Socks 7/22 Disposition Plan: Status is: Inpatient  Remains inpatient appropriate because:Unsafe d/c plan   Dispo: The patient is from: SNF              Anticipated d/c is to: Inpatient psychiatry              Anticipated d/c date is: 1 day  Patient currently is medically stable to d/c. --- -Patient is medically stable for discharge to inpatient psychiatric/behavioral unit when bed becomes available   Consultants:   Psychiatry  Procedures:     Antimicrobials:       Subjective: -Resting comfortably  intermittently not cooperative -- -Patient is medically stable for discharge to inpatient psychiatric/behavioral unit when bed becomes available  Objective: Vitals:   07/10/20 0705 07/10/20 1041 07/10/20 1225 07/10/20 1252  BP: (!) 150/90 (!) 135/78 (!) 138/76 (!) 133/72  Pulse: 79  78 87  Resp: 20  20   Temp: 98.1 F (36.7 C)  98.1 F (36.7 C)   TempSrc: Oral  Oral   SpO2: 100%     Weight:      Height:        Intake/Output Summary (Last 24 hours) at 07/10/2020 1900 Last data filed at 07/10/2020 1737 Gross per 24 hour  Intake 1080 ml  Output --  Net 1080 ml   Filed Weights   07/03/20 1504 07/04/20 0200  Weight: 92.5 kg 97.3 kg    Examination:  General exam: Alert, awake, oriented x 3 Respiratory system: Clear to auscultation. Respiratory effort normal. Cardiovascular system:RRR. No murmurs, rubs, gallops.  Data Reviewed: I have personally reviewed following labs and imaging studies  CBC: Recent Labs  Lab 07/03/20 2016  WBC 3.1*  NEUTROABS 1.6*  HGB 12.8*  HCT 41.4  MCV 90.6  PLT 932*   Basic Metabolic Panel: Recent Labs  Lab 07/03/20 2016  NA 146*  K 4.1  CL 111  CO2 26  GLUCOSE 108*  BUN 29*  CREATININE 1.23  CALCIUM 10.0   GFR: Estimated Creatinine Clearance: 59.8 mL/min (by C-G formula based on SCr of 1.23 mg/dL). Liver Function Tests: Recent Labs  Lab 07/03/20 2016  AST 17  ALT 18  ALKPHOS 62  BILITOT 0.5  PROT 7.4  ALBUMIN 4.1   No results for input(s): LIPASE, AMYLASE in the last 168 hours. Recent Labs  Lab 07/03/20 2305  AMMONIA 14   Coagulation Profile: No results for input(s): INR, PROTIME in the last 168 hours. Cardiac Enzymes: No results for input(s): CKTOTAL, CKMB, CKMBINDEX, TROPONINI in the last 168 hours. BNP (last 3 results) No results for input(s): PROBNP in the last 8760 hours. HbA1C: No results for input(s): HGBA1C in the last 72 hours. CBG: No results for input(s): GLUCAP in the last 168 hours. Lipid  Profile: No results for input(s): CHOL, HDL, LDLCALC, TRIG, CHOLHDL, LDLDIRECT in the last 72 hours. Thyroid Function Tests: No results for input(s): TSH, T4TOTAL, FREET4, T3FREE, THYROIDAB in the last 72 hours. Anemia Panel: No results for input(s): VITAMINB12, FOLATE, FERRITIN, TIBC, IRON, RETICCTPCT in the last 72 hours. Sepsis Labs: No results for input(s): PROCALCITON, LATICACIDVEN in the last 168 hours.  Recent Results (from the past 240 hour(s))  Urine Culture     Status: Abnormal   Collection Time: 07/03/20  3:10 PM   Specimen: Urine, Catheterized  Result Value Ref Range Status   Specimen Description   Final    URINE, CATHETERIZED Performed at [facility], [address] [place], [place] [address]    Special Requests   Final    NONE Performed at [facility], [address] [place], [place] [address]    Culture (A)  Final    >=100,000 COLONIES/mL ESCHERICHIA COLI Confirmed Extended Spectrum Beta-Lactamase Producer (ESBL).  In bloodstream infections from ESBL organisms, carbapenems are preferred over piperacillin/tazobactam. They are shown to have a lower risk  of mortality.    Report Status 07/07/2020 FINAL  Final   Organism ID, Bacteria ESCHERICHIA COLI (A)  Final      Susceptibility   Escherichia coli - MIC*    AMPICILLIN >=32 RESISTANT Resistant     CEFAZOLIN >=64 RESISTANT Resistant     CEFTRIAXONE >=64 RESISTANT Resistant     CIPROFLOXACIN >=4 RESISTANT Resistant     GENTAMICIN <=1 SENSITIVE Sensitive     IMIPENEM <=0.25 SENSITIVE Sensitive     NITROFURANTOIN <=16 SENSITIVE Sensitive     TRIMETH/SULFA >=320 RESISTANT Resistant     AMPICILLIN/SULBACTAM >=32 RESISTANT Resistant     PIP/TAZO >=128 RESISTANT Resistant     * >=100,000 COLONIES/mL ESCHERICHIA COLI  SARS Coronavirus 2 by RT PCR (hospital order, performed in [facility] hospital lab) Nasopharyngeal Nasopharyngeal Swab     Status: None   Collection Time: 07/03/20  8:24 PM   Specimen: Nasopharyngeal Swab   Result Value Ref Range Status   SARS Coronavirus 2 NEGATIVE NEGATIVE Final    Comment: (NOTE) SARS-CoV-2 target nucleic acids are NOT DETECTED.  The SARS-CoV-2 RNA is generally detectable in upper and lower respiratory specimens during the acute phase of infection. The lowest concentration of SARS-CoV-2 viral copies this assay can detect is 250 copies / mL. A negative result does not preclude SARS-CoV-2 infection and should not be used as the sole basis for treatment or other patient management decisions.  A negative result may occur with improper specimen collection / handling, submission of specimen other than nasopharyngeal swab, presence of viral mutation(s) within the areas targeted by this assay, and inadequate number of viral copies (<250 copies / mL). A negative result must be combined with clinical observations, patient history, and epidemiological information.  Fact Sheet for Patients:   StrictlyIdeas.no  Fact Sheet for Healthcare Providers: BankingDealers.co.za  This test is not yet approved or  cleared by the [place] FDA and has been authorized for detection and/or diagnosis of SARS-CoV-2 by FDA under an Emergency Use Authorization (EUA).  This EUA will remain in effect (meaning this test can be used) for the duration of the COVID-19 declaration under Section 564(b)(1) of the Act, 21 U.S.C. section 360bbb-3(b)(1), unless the authorization is terminated or revoked sooner.  Performed at [facility], [address], [place], [place] [address]          Radiology Studies: No results found.      Scheduled Meds: . amantadine  100 mg Oral BID  . amLODipine  5 mg Oral Daily  . divalproex  1,000 mg Oral Q12H  . FLUoxetine  40 mg Oral Daily  . haloperidol  2 mg Oral TID  . heparin  5,000 Units Subcutaneous Q8H  . lamoTRIgine  50 mg Oral BID  . levETIRAcetam  500 mg Oral BID  . memantine  5 mg Oral QHS    Continuous Infusions:    LOS: 6 days    -Patient is medically stable for discharge to inpatient psychiatric/behavioral unit when bed becomes available  Roxan Hockey, MD [facility]   If 7PM-7AM, please contact night-coverage [email or web address]  07/10/2020, 7:00 PM

## 2020-07-10 NOTE — Care Management Important Message (Signed)
Important Message  Patient Details  Name: Nickolaos Brallier MRN: [id number] Date of Birth: 1941-02-05   Medicare Important Message Given:  Yes (mailed to [facility] at [address] [place], [place])     Tommy Medal 07/10/2020, 12:46 PM

## 2020-07-10 NOTE — Progress Notes (Signed)
 -  Patient is medically stable for discharge to inpatient psychiatric/behavioral unit when bed becomes available  Roxan Hockey, MD

## 2020-07-10 NOTE — Progress Notes (Signed)
Guardian of patient, Darrick Meigs notified staff that the brother and niece of Henry Smith may visit. The visitation will need to be limited to no more than 30 minutes. The patient has verbalized he would like to visit with his brother.  Donavan Foil RN and Dr. Joesph Fillers notified. Elodia Florence RN

## 2020-07-10 NOTE — Progress Notes (Signed)
Patient ID: Henry Smith, male   DOB: [birth year], 79 y.o.   MRN: [id number]   Psychiatric reassessment   In brief; 79 year old male with a history of neurosyphilis, dementia, schizoaffective disorder, COPD, thrombocytopenia, seizure disorder, who is a long-term resident of a nursing facility,  Patient presented to [facility] following changes in mental status and aggressive behavior so a psychiatric consult was placed. Since being in the hospital, patient has had period of agitation and periods where he is refusing to take his proscribed medications. Per nursing, patient has had episodes where he drank his urine and smeared stool," all over" while in the bathroom.   During this evaluation, patient was noted resting in chair. He would only shake his head no when questions were asked so there was minimal participation. He shook his head no to suicidal and homicidal thoughts along with hallucinations although it is unclear if he understood the question as again, he shook his head no to each question asked. At some points, he shrugged his shoulder in the manner of saying I don't know but he remained nonverbal throughout the evaluation. There were no signs at this time that he was responding to internal or external stimuli.   Disposition: Patient continues to present with a decline in mental status with no improvement in mental stability. As such, inpatient hospitalization continues to be recommended. I have reviewed his medications and the concern is not that he medications aren't effective, the concern is patients non-compliance. I will not make any adjustments to medications at this time and patient to be encouraged to take medications. If patient continues to refuse medications,  I will speak to psychiatrist about forced medications being an option. [place] will continued to look for inpatient geri-psych treatment. Following chart review, patient has been medically cleared which is documented.

## 2020-07-10 NOTE — TOC Initial Note (Signed)
Transition of Care [facility]) - Initial/Assessment Note    Patient Details  Name: Henry Smith MRN: [id number] Date of Birth: 06-13-1941  Transition of Care [facility]) CM/SW Contact:    Salome Arnt, [place] Phone Number: 07/10/2020, 2:56 PM  Clinical Narrative:  Pt admitted due to acute metabolic encephalopathy. He has been a resident at [facility]. Per notes, pt was threatening staff and drinking his urine. Pt has been medically cleared and recommendation is for gero-psych. Awaiting placement. TOC will continue to follow.                    Expected Discharge Plan: Psychiatric Hospital Barriers to Discharge: Psych Bed not available   Patient Goals and CMS Choice        Expected Discharge Plan and Services Expected Discharge Plan: [facility] In-house Referral: Clinical Social Work     Living arrangements for the past 2 months: [place] Expected Discharge Date: 07/10/20                                    Prior Living Arrangements/Services Living arrangements for the past 2 months: [place] Lives with:: Facility Resident                Criminal Activity/Legal Involvement Pertinent to Current Situation/Hospitalization: No - Comment as needed  Activities of Daily Living Home Assistive Devices/Equipment: None ADL Screening (condition at time of admission) Patient's cognitive ability adequate to safely complete daily activities?: Yes Is the patient deaf or have difficulty hearing?: No Does the patient have difficulty seeing, even when wearing glasses/contacts?: No Does the patient have difficulty concentrating, remembering, or making decisions?: Yes Patient able to express need for assistance with ADLs?: Yes Does the patient have difficulty dressing or bathing?: No Independently performs ADLs?: Yes (appropriate for developmental age) Does the patient have difficulty walking or climbing stairs?: No Weakness of Legs:  None Weakness of Arms/Hands: None  Permission Sought/Granted                  Emotional Assessment   Attitude/Demeanor/Rapport: Unable to Assess Affect (typically observed): Unable to Assess Orientation: : Oriented to Self   Psych Involvement: Yes (comment)  Admission diagnosis:  Acute cystitis without hematuria [N30.00] Altered mental status, unspecified altered mental status type [M57.84] Acute metabolic encephalopathy [O96.29] Patient Active Problem List   Diagnosis Date Noted  . Acute metabolic encephalopathy 52/84/1324  . DNR (do not resuscitate) discussion   . Seizure ([place]) 06/05/2020  . COPD (chronic obstructive pulmonary disease) ([place])   . Dysphagia   . Noncompliance with medication regimen   . Goals of care, counseling/discussion   . Palliative care by specialist   . AMS (altered mental status) 01/08/2020  . COVID-19 virus infection 01/02/2020  . Sepsis ([place]) 01/01/2020  . 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[place])   . Hypertensive urgency   . Acute respiratory failure with hypoxia ([place])   . Acute respiratory failure due to COVID-19 ([place])   . Proteus mirabilis infection 04/27/2018  . Bacteremia 04/27/2018  . Sepsis due to Gram negative bacteria ([place]) 04/27/2018  . UTI (urinary tract infection) 04/24/2018  . Acute encephalopathy 04/24/2018  . Sepsis secondary to UTI ([place]) 04/23/2018  . Tobacco use disorder 01/02/2018  . Cognitive impairment 12/29/2017  . Schizoaffective disorder, bipolar type ([place]) 12/29/2017  . Gait abnormality 05/06/2017  . Allergic drug rash 03/03/2017  . Altered mental  status 02/26/2017  . Syphilis 01/31/2017  . Disorientation   . Generalized weakness 01/30/2017  . Altered mental status, unspecified 01/28/2017  . Hypercalcemia 11/01/2016  . Prostate cancer ([place]) 10/30/2016  . Leukopenia 10/30/2016  . Thrombocytopenia ([place]) 10/30/2016   PCP:  Caprice Renshaw, MD Pharmacy:   [facility] [place] [address] [place] [address] Phone: [phone] Fax: [phone]  [facility], [address] [place]. [place] [address] Phone: [phone] Fax: [phone]     Social Determinants of Health (SDOH) Interventions    Readmission Risk Interventions Readmission Risk Prevention Plan 07/10/2020  Transportation Screening Complete  HRI or [place] Complete  Social Work Consult for [place] Planning/Counseling Complete  Palliative Care Screening Not Applicable  Medication Review Press [employer]) Complete  Some recent data might be hidden

## 2020-07-11 DIAGNOSIS — R569 Unspecified convulsions: Secondary | ICD-10-CM | POA: Diagnosis not present

## 2020-07-11 DIAGNOSIS — Z9114 Patient's other noncompliance with medication regimen: Secondary | ICD-10-CM | POA: Diagnosis not present

## 2020-07-11 DIAGNOSIS — D696 Thrombocytopenia, unspecified: Secondary | ICD-10-CM

## 2020-07-11 DIAGNOSIS — G9341 Metabolic encephalopathy: Secondary | ICD-10-CM | POA: Diagnosis not present

## 2020-07-11 DIAGNOSIS — F250 Schizoaffective disorder, bipolar type: Secondary | ICD-10-CM | POA: Diagnosis not present

## 2020-07-11 MED ORDER — MAGNESIUM HYDROXIDE 400 MG/5ML PO SUSP
30.0000 mL | Freq: Every day | ORAL | Status: DC | PRN
  Administered 2020-07-31 – 2020-08-31 (×3): 30 mL via ORAL
  Filled 2020-07-11 (×3): qty 30

## 2020-07-11 MED ORDER — CLONIDINE HCL 0.1 MG/24HR TD PTWK
0.1000 mg | MEDICATED_PATCH | TRANSDERMAL | Status: DC
  Filled 2020-07-11: qty 1

## 2020-07-11 MED ORDER — CALCIUM CARBONATE ANTACID 500 MG PO CHEW
400.0000 mg | CHEWABLE_TABLET | Freq: Once | ORAL | Status: AC
  Administered 2020-07-11: 400 mg via ORAL
  Filled 2020-07-11: qty 2

## 2020-07-11 NOTE — BH Assessment (Signed)
UPDATE: 07/11/20  At this time, patient continues to meet inpatient criteria for gero-psych services.   Patient continues to be under review at the following facilities:  [facility] - per admission, at capacity at this time - 7/29 [facility] - left voicemail, no answer at this time -7/29 [facility] - left voicemail, no answer at this time - 7/29 [facility] - left voicemail, no answer at this time - 7/29 [facility] - at capacity on 7/29, requested a call back [facility] - at capacity on 7/29, requested a call back    Patient was DENIED for services at the following facilities:  [facility] - declined due to medical acuity  [facility] - declined due to medical concerns  [facility] - declined due to noncompliance, and being too medically complex. [facility] psych unit closed due to re-construction    TTS/Disposition CSW will continue to follow for possible placement.    Radonna Ricker, MSW, LCSW Clinical Social Worker [place]

## 2020-07-11 NOTE — BH Assessment (Addendum)
[facility] Assessment Progress Note   Attempted to reassess patient.  When the machine was rolled into the room, patient got out of bed and started messing with the Tele-med Cart.  When the nurse asked him if he was willing to talk to this writer, he stated, "Hell no, I am not talking to anybody."  When the nurse told him that he could not mess with the computer, he stated, "I will bop you upside your head."  Continued inpatient recommended.

## 2020-07-11 NOTE — Progress Notes (Addendum)
PROGRESS NOTE    Henry Smith  WCB:[id number] DOB: 26-Dec-1940 DOA: 07/03/2020 PCP: Caprice Renshaw, MD    Brief Narrative:  79 year old male with a history of neurosyphilis, dementia, schizoaffective disorder, COPD, thrombocytopenia, seizure disorder, who is a long-term resident of a nursing facility, was brought to the hospital with progressive changes in mental status.  Patient had been coming increasingly aggressive, was refusing any medications, becoming paranoid.  More recently, staff had noted that he was drinking his urine.  He was evaluated in the emergency room and felt that he may have a urinary tract infection was admitted to the hospital service.  Further review of urine studies indicate that his positive urine culture is likely a colonization and not a true infection.  He did not have any other signs of active infection.  Discussed with infectious disease who agreed with not treating with antibiotics.  It was felt that his progressive decline in mental status was related to his underlying behavioral health.  Seen by psychiatry with recommendations for inpatient psychiatry.  He is involuntarily committed.  Patient is medically stable for discharge whenever he can be placed by psychiatry. - -Patient is medically stable for discharge to inpatient psychiatric/behavioral unit when bed becomes available  Assessment & Plan:   Active Problems:   Schizoaffective disorder, bipolar type (HCC)   Thrombocytopenia (HCC)   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HCC)   Seizure ([place])   Noncompliance with medication regimen   Acute metabolic encephalopathy   1)Progressive behavioral changes---  Initially felt to be related to possible UTI, but patient likely has colonization and not a true urinary tract infection.  He has been refusing his medications for weeks to months.  He is on several psychotropic medications such as Depakote, Haldol, Ativan.  He also takes Lamictal, Keppra for seizures.   His behavior changes appear to be chronic progressive in nature and appear to be secondary to his underlying mental illness.  Other work-up including CT imaging of head, basic chemistry, ammonia, TSH, ABG were unrevealing.  Appreciate psychiatry input.  Recommendations are for inpatient psychiatry.  He is currently under involuntary commitment.---- -Patient is medically stable for discharge to inpatient psychiatric/behavioral unit when bed becomes available  2)ESBL E coli  in urine--  Discussed with infectious disease, Dr. Drucilla Schmidt.  With no leukocytosis, fever, dysuria, unlikely to be an active infection.  Felt to be more related to colonization.  Antibiotics were not recommended in this situation.  3)Dementia associate with neurosyphilis---  Review of records indicate that he was treated for neurosyphilis in 2018.  He is chronically on Namenda which he has been refusing.  4)Thrombocytopenia.  Appears to be a chronic issue.  5)Seizure disorder.  Chronically on Keppra and Lamictal.  He has been refusing these medications.  He has IM Ativan as needed ordered if he has any breakthrough seizures.  6)Noncompliance.  Patient has been refusing medications for several weeks to months at this point.    7)HTN--BP is not at goal partly due to poor compliance with medications will try clonidine patch once weekly to try to improve compliance -Continue amlodipine 5 mg daily when patient agrees to take it   DVT prophylaxis: heparin injection 5,000 Units Start: 07/04/20 0600 SCDs Start: 07/04/20 0130  Code Status: full code Family Communication: left VM for legal guardian Loa Socks 7/22 Disposition Plan: Status is: Inpatient  Remains inpatient appropriate because:Unsafe d/c plan   Dispo: The patient is from: SNF  Anticipated d/c is to: Inpatient psychiatry              Anticipated d/c date is: 1 day              Patient currently is medically stable to d/c. --- -Patient is medically  stable for discharge to inpatient psychiatric/behavioral unit when bed becomes available   Consultants:   Psychiatry  Procedures:     Antimicrobials:       Subjective: -Resting comfortably -- intermittently not cooperative -- -Patient is medically stable for discharge to inpatient psychiatric/behavioral unit when bed becomes available  Objective: Vitals:   07/10/20 1041 07/10/20 1225 07/10/20 1252 07/10/20 2223  BP: (!) 135/78 (!) 138/76 (!) 133/72 (!) 170/80  Pulse:  78 87 57  Resp:  20  18  Temp:  98.1 F (36.7 C)  98.6 F (37 C)  TempSrc:  Oral  Oral  SpO2:    99%  Weight:      Height:        Intake/Output Summary (Last 24 hours) at 07/11/2020 1512 Last data filed at 07/11/2020 1047 Gross per 24 hour  Intake 960 ml  Output --  Net 960 ml   Filed Weights   07/03/20 1504 07/04/20 0200  Weight: 92.5 kg 97.3 kg    Examination:  General exam: Alert, awake, oriented x 3 Respiratory system: Clear to auscultation. Respiratory effort normal. Cardiovascular system:RRR. No murmurs, rubs, gallops. Neuro--ambulating around in the room, overall steady, no obvious new focal deficits Psych--- psychosis, noncompliance persist with episodes of bizarre behavior like to drinking his own  Data Reviewed:   CBC: No results for input(s): WBC, NEUTROABS, HGB, HCT, MCV, PLT in the last 168 hours. Basic Metabolic Panel: No results for input(s): NA, K, CL, CO2, GLUCOSE, BUN, CREATININE, CALCIUM, MG, PHOS in the last 168 hours. GFR: Estimated Creatinine Clearance: 59.8 mL/min (by C-G formula based on SCr of 1.23 mg/dL). Liver Function Tests: No results for input(s): AST, ALT, ALKPHOS, BILITOT, PROT, ALBUMIN in the last 168 hours. No results for input(s): LIPASE, AMYLASE in the last 168 hours. No results for input(s): AMMONIA in the last 168 hours. Coagulation Profile: No results for input(s): INR, PROTIME in the last 168 hours. Cardiac Enzymes: No results for input(s):  CKTOTAL, CKMB, CKMBINDEX, TROPONINI in the last 168 hours. BNP (last 3 results) No results for input(s): PROBNP in the last 8760 hours. HbA1C: No results for input(s): HGBA1C in the last 72 hours. CBG: No results for input(s): GLUCAP in the last 168 hours. Lipid Profile: No results for input(s): CHOL, HDL, LDLCALC, TRIG, CHOLHDL, LDLDIRECT in the last 72 hours. Thyroid Function Tests: No results for input(s): TSH, T4TOTAL, FREET4, T3FREE, THYROIDAB in the last 72 hours. Anemia Panel: No results for input(s): VITAMINB12, FOLATE, FERRITIN, TIBC, IRON, RETICCTPCT in the last 72 hours. Sepsis Labs: No results for input(s): PROCALCITON, LATICACIDVEN in the last 168 hours.  Recent Results (from the past 240 hour(s))  Urine Culture     Status: Abnormal   Collection Time: 07/03/20  3:10 PM   Specimen: Urine, Catheterized  Result Value Ref Range Status   Specimen Description   Final    URINE, CATHETERIZED Performed at [facility], [address] [place], [place] [address]    Special Requests   Final    NONE Performed at [facility], [address] [place], [place] [address]    Culture (A)  Final    >=100,000 COLONIES/mL ESCHERICHIA COLI Confirmed Extended Spectrum Beta-Lactamase Producer (  ESBL).  In bloodstream infections from ESBL organisms, carbapenems are preferred over piperacillin/tazobactam. They are shown to have a lower risk of mortality.    Report Status 07/07/2020 FINAL  Final   Organism ID, Bacteria ESCHERICHIA COLI (A)  Final      Susceptibility   Escherichia coli - MIC*    AMPICILLIN >=32 RESISTANT Resistant     CEFAZOLIN >=64 RESISTANT Resistant     CEFTRIAXONE >=64 RESISTANT Resistant     CIPROFLOXACIN >=4 RESISTANT Resistant     GENTAMICIN <=1 SENSITIVE Sensitive     IMIPENEM <=0.25 SENSITIVE Sensitive     NITROFURANTOIN <=16 SENSITIVE Sensitive     TRIMETH/SULFA >=320 RESISTANT Resistant     AMPICILLIN/SULBACTAM >=32 RESISTANT Resistant     PIP/TAZO >=128  RESISTANT Resistant     * >=100,000 COLONIES/mL ESCHERICHIA COLI  SARS Coronavirus 2 by RT PCR (hospital order, performed in [place] hospital lab) Nasopharyngeal Nasopharyngeal Swab     Status: None   Collection Time: 07/03/20  8:24 PM   Specimen: Nasopharyngeal Swab  Result Value Ref Range Status   SARS Coronavirus 2 NEGATIVE NEGATIVE Final    Comment: (NOTE) SARS-CoV-2 target nucleic acids are NOT DETECTED.  The SARS-CoV-2 RNA is generally detectable in upper and lower respiratory specimens during the acute phase of infection. The lowest concentration of SARS-CoV-2 viral copies this assay can detect is 250 copies / mL. A negative result does not preclude SARS-CoV-2 infection and should not be used as the sole basis for treatment or other patient management decisions.  A negative result may occur with improper specimen collection / handling, submission of specimen other than nasopharyngeal swab, presence of viral mutation(s) within the areas targeted by this assay, and inadequate number of viral copies (<250 copies / mL). A negative result must be combined with clinical observations, patient history, and epidemiological information.  Fact Sheet for Patients:   StrictlyIdeas.no  Fact Sheet for Healthcare Providers: BankingDealers.co.za  This test is not yet approved or  cleared by the [place] FDA and has been authorized for detection and/or diagnosis of SARS-CoV-2 by FDA under an Emergency Use Authorization (EUA).  This EUA will remain in effect (meaning this test can be used) for the duration of the COVID-19 declaration under Section 564(b)(1) of the Act, 21 U.S.C. section 360bbb-3(b)(1), unless the authorization is terminated or revoked sooner.  Performed at [facility], [address] [place], [place] [address]     Radiology Studies: No results found.  Scheduled Meds: . amantadine  100 mg Oral BID  . amLODipine  5  mg Oral Daily  . cloNIDine  0.1 mg Transdermal Weekly  . divalproex  1,000 mg Oral Q12H  . FLUoxetine  40 mg Oral Daily  . haloperidol  2 mg Oral TID  . heparin  5,000 Units Subcutaneous Q8H  . lamoTRIgine  50 mg Oral BID  . levETIRAcetam  500 mg Oral BID  . memantine  5 mg Oral QHS   Continuous Infusions:    LOS: 7 days   -Patient is medically stable for discharge to inpatient psychiatric/behavioral unit when bed becomes available  Roxan Hockey, MD [facility]   If 7PM-7AM, please contact night-coverage [email or web address]  07/11/2020, 3:12 PM

## 2020-07-12 DIAGNOSIS — F250 Schizoaffective disorder, bipolar type: Secondary | ICD-10-CM | POA: Diagnosis not present

## 2020-07-12 DIAGNOSIS — A5217 General paresis: Secondary | ICD-10-CM | POA: Diagnosis not present

## 2020-07-12 DIAGNOSIS — F0280 Dementia in other diseases classified elsewhere without behavioral disturbance: Secondary | ICD-10-CM | POA: Diagnosis not present

## 2020-07-12 LAB — COMPREHENSIVE METABOLIC PANEL
ALT: 20 U/L (ref 0–44)
AST: 16 U/L (ref 15–41)
Albumin: 3.5 g/dL (ref 3.5–5.0)
Alkaline Phosphatase: 58 U/L (ref 38–126)
Anion gap: 9 (ref 5–15)
BUN: 16 mg/dL (ref 8–23)
CO2: 26 mmol/L (ref 22–32)
Calcium: 9.7 mg/dL (ref 8.9–10.3)
Chloride: 105 mmol/L (ref 98–111)
Creatinine, Ser: 0.72 mg/dL (ref 0.61–1.24)
GFR calc Af Amer: >60 mL/min (ref >60)
GFR calc non Af Amer: >60 mL/min (ref >60)
Glucose, Bld: 86 mg/dL (ref 70–99)
Potassium: 4.1 mmol/L (ref 3.5–5.1)
Sodium: 140 mmol/L (ref 135–145)
Total Bilirubin: 0.3 mg/dL (ref 0.3–1.2)
Total Protein: 6.9 g/dL (ref 6.5–8.1)

## 2020-07-12 LAB — CBC
HCT: 40.3 % (ref 39.0–52.0)
Hemoglobin: 12.4 g/dL — ABNORMAL LOW (ref 13.0–17.0)
MCH: 27.8 pg (ref 26.0–34.0)
MCHC: 30.8 g/dL (ref 30.0–36.0)
MCV: 90.4 fL (ref 80.0–100.0)
Platelets: 129 10*3/uL — ABNORMAL LOW (ref 150–400)
RBC: 4.46 MIL/uL (ref 4.22–5.81)
RDW: 14.8 % (ref 11.5–15.5)
WBC: 2.5 10*3/uL — ABNORMAL LOW (ref 4.0–10.5)
nRBC: 0 % (ref 0.0–0.2)

## 2020-07-12 NOTE — Progress Notes (Addendum)
Pt continues to meet inpatient criteria and has been declined by several gero-psych facilities due to medical acuity. A state hospital referral form has been completed and faxed to [facility] for review. [employer] will contact disposition with authorization number and/or any questions or concerns.   Disposition will continue to follow.   Audree Camel, LCSW, [place] Disposition CSW [facility]/TTS [phone]   Update 1pm: CSW spoke with Access Coordinator with [employer]. They have received the authorization request but have not reviewed it at this time. They will call [phone] to give authorization info and will fax approval form to [phone] ([employer]), when complete.

## 2020-07-12 NOTE — Progress Notes (Signed)
1330: Pt up out of bed and into hallway, approached sitter then began walking down hallway. Pt would not return to room when asked, security called and to floor. Pt now agreeable to returning to room. Back in room, sitting quietly in chair.   1351: Pt agreeable at this time to taking his oral am medications. Tolerated well with ginger ale.  1430: Pt does make defamatory comments to nursing aide when she entered the room, called her a "whore" and raised his arm at her. However, pt put his arm down when I asked and allowed me to get his vital signs without issue.

## 2020-07-12 NOTE — BHH Counselor (Addendum)
TTS reassessment: Patient is sitting upright in chair, dressed in scrubs. He is cooperative during assessment. He is unable to tell me how long he has been in the hospital or why he is there. When asked where he lived prior to hospitalization he states "[place], [place]. [address]. My mom had 13 kids." Patient states he would like to return there. He states we can contact his brother, Dorothy Landgrebe. Patient denies SI/HI/AVH. Per RN patient agitated earlier this date. 2 days ago drinking urine. Patient continues to meet in patient criteria based on noted behaviors.

## 2020-07-12 NOTE — Progress Notes (Signed)
PROGRESS NOTE    Henry Smith  IDP:[id number] DOB: [birth year] DOA: 07/03/2020 PCP: Caprice Renshaw, MD    Brief Narrative:  79 year old male with a history of neurosyphilis, dementia, schizoaffective disorder, COPD, thrombocytopenia, seizure disorder, who is a long-term resident of a nursing facility, was brought to the hospital with progressive changes in mental status.  Patient had been coming increasingly aggressive, was refusing any medications, becoming paranoid.  More recently, staff had noted that he was drinking his urine.  He was evaluated in the emergency room and felt that he may have a urinary tract infection was admitted to the hospital service.  Further review of urine studies indicate that his positive urine culture is likely a colonization and not a true infection.  He did not have any other signs of active infection.  Discussed with infectious disease who agreed with not treating with antibiotics.  It was felt that his progressive decline in mental status was related to his underlying behavioral health.  Seen by psychiatry with recommendations for inpatient psychiatry.  He is involuntarily committed.  Patient is medically stable for discharge whenever he can be placed by psychiatry. -Patient is independent with ADLs, ambulatory  -Patient is medically stable for discharge to inpatient psychiatric/behavioral unit when bed becomes available  Assessment & Plan:   Active Problems:   Schizoaffective disorder, bipolar type (HCC)   Thrombocytopenia (HCC)   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HCC)   Seizure ([place])   Noncompliance with medication regimen   Acute metabolic encephalopathy   1)Progressive behavioral changes---  Initially felt to be related to possible UTI, but patient likely has colonization and not a true urinary tract infection.  He has been refusing his medications for weeks to months.  He is on several psychotropic medications such as Depakote, Haldol, Ativan.  He  also takes Lamictal, Keppra for seizures.  His behavior changes appear to be chronic progressive in nature and appear to be secondary to his underlying mental illness.  Other work-up including CT imaging of head, basic chemistry, ammonia, TSH, ABG were unrevealing.  Appreciate psychiatry input.  Recommendations are for inpatient psychiatry.  He is currently under involuntary commitment.---- -Patient is medically stable for discharge to inpatient psychiatric/behavioral unit when bed becomes available  2)ESBL E coli  in urine--  Discussed with infectious disease, Dr. Drucilla Schmidt.  With no leukocytosis, fever, dysuria, unlikely to be an active infection.  Felt to be more related to colonization.  Antibiotics were not recommended in this situation.  3)Dementia associate with neurosyphilis---  Review of records indicate that he was treated for neurosyphilis in 2018.  He is chronically on Namenda which he has been refusing.  4)Thrombocytopenia.  Appears to be a chronic issue.  5)Seizure disorder.  Chronically on Keppra and Lamictal.  He has been refusing these medications.  He has IM Ativan as needed ordered if he has any breakthrough seizures.  6)Noncompliance.  Patient has been refusing medications for several weeks to months at this point.    7)HTN--BP is not at goal partly due to poor compliance with medications will try clonidine patch once weekly to try to improve compliance -Continue amlodipine 5 mg daily when patient agrees to take it   DVT prophylaxis: heparin injection 5,000 Units Start: 07/04/20 0600 SCDs Start: 07/04/20 0130  Code Status: full code Family Communication: left VM for legal guardian Loa Socks 7/22 Disposition Plan: Status is: Inpatient  Remains inpatient appropriate because:Unsafe d/c plan   Dispo: The patient is from: SNF  Anticipated d/c is to: Inpatient psychiatry              Anticipated d/c date is: 1 day              Patient currently is medically  stable to d/c. --- -Patient is medically stable for discharge to inpatient psychiatric/behavioral unit when bed becomes available   Consultants:   Psychiatry  Procedures:     Antimicrobials:       Subjective: -Resting comfortably -- intermittently not cooperative ---Patient is independent with ADLs, ambulatory -Patient is medically stable for discharge to inpatient psychiatric/behavioral unit when bed becomes available  Objective: Vitals:   07/10/20 1252 07/10/20 2223 07/11/20 2152 07/12/20 0640  BP: (!) 133/72 (!) 170/80 120/83 (!) 141/75  Pulse: 87 57 77 57  Resp:  18 20   Temp:  98.6 F (37 C) 98.2 F (36.8 C) 97.9 F (36.6 C)  TempSrc:  Oral Oral Oral  SpO2:  99% 100% 100%  Weight:      Height:        Intake/Output Summary (Last 24 hours) at 07/12/2020 1937 Last data filed at 07/12/2020 1417 Gross per 24 hour  Intake 490 ml  Output --  Net 490 ml   Filed Weights   07/03/20 1504 07/04/20 0200  Weight: 92.5 kg 97.3 kg    Examination:  General exam: Alert, awake, oriented x 3 Respiratory system: Clear to auscultation. Respiratory effort normal. Cardiovascular system:RRR. No murmurs, rubs, gallops. Neuro--ambulating around in the room, overall steady, no obvious new focal deficits Psych--- psychosis, noncompliance persist with episodes of bizarre behavior like to drinking his own  Data Reviewed:   CBC: Recent Labs  Lab 07/12/20 0438  WBC 2.5*  HGB 12.4*  HCT 40.3  MCV 90.4  PLT 672*   Basic Metabolic Panel: Recent Labs  Lab 07/12/20 0438  NA 140  K 4.1  CL 105  CO2 26  GLUCOSE 86  BUN 16  CREATININE 0.72  CALCIUM 9.7   GFR: Estimated Creatinine Clearance: 92 mL/min (by C-G formula based on SCr of 0.72 mg/dL). Liver Function Tests: Recent Labs  Lab 07/12/20 0438  AST 16  ALT 20  ALKPHOS 58  BILITOT 0.3  PROT 6.9  ALBUMIN 3.5   No results for input(s): LIPASE, AMYLASE in the last 168 hours. No results for input(s):  AMMONIA in the last 168 hours. Coagulation Profile: No results for input(s): INR, PROTIME in the last 168 hours. Cardiac Enzymes: No results for input(s): CKTOTAL, CKMB, CKMBINDEX, TROPONINI in the last 168 hours. BNP (last 3 results) No results for input(s): PROBNP in the last 8760 hours. HbA1C: No results for input(s): HGBA1C in the last 72 hours. CBG: No results for input(s): GLUCAP in the last 168 hours. Lipid Profile: No results for input(s): CHOL, HDL, LDLCALC, TRIG, CHOLHDL, LDLDIRECT in the last 72 hours. Thyroid Function Tests: No results for input(s): TSH, T4TOTAL, FREET4, T3FREE, THYROIDAB in the last 72 hours. Anemia Panel: No results for input(s): VITAMINB12, FOLATE, FERRITIN, TIBC, IRON, RETICCTPCT in the last 72 hours. Sepsis Labs: No results for input(s): PROCALCITON, LATICACIDVEN in the last 168 hours.  Recent Results (from the past 240 hour(s))  Urine Culture     Status: Abnormal   Collection Time: 07/03/20  3:10 PM   Specimen: Urine, Catheterized  Result Value Ref Range Status   Specimen Description   Final    URINE, CATHETERIZED Performed at [facility], [address], [place], [place] [address]  Special Requests   Final    NONE Performed at [facility], [address], [place], [place] [address]    Culture (A)  Final    >=100,000 COLONIES/mL ESCHERICHIA COLI Confirmed Extended Spectrum Beta-Lactamase Producer (ESBL).  In bloodstream infections from ESBL organisms, carbapenems are preferred over piperacillin/tazobactam. They are shown to have a lower risk of mortality.    Report Status 07/07/2020 FINAL  Final   Organism ID, Bacteria ESCHERICHIA COLI (A)  Final      Susceptibility   Escherichia coli - MIC*    AMPICILLIN >=32 RESISTANT Resistant     CEFAZOLIN >=64 RESISTANT Resistant     CEFTRIAXONE >=64 RESISTANT Resistant     CIPROFLOXACIN >=4 RESISTANT Resistant     GENTAMICIN <=1 SENSITIVE Sensitive     IMIPENEM <=0.25 SENSITIVE Sensitive      NITROFURANTOIN <=16 SENSITIVE Sensitive     TRIMETH/SULFA >=320 RESISTANT Resistant     AMPICILLIN/SULBACTAM >=32 RESISTANT Resistant     PIP/TAZO >=128 RESISTANT Resistant     * >=100,000 COLONIES/mL ESCHERICHIA COLI  SARS Coronavirus 2 by RT PCR (hospital order, performed in [place] hospital lab) Nasopharyngeal Nasopharyngeal Swab     Status: None   Collection Time: 07/03/20  8:24 PM   Specimen: Nasopharyngeal Swab  Result Value Ref Range Status   SARS Coronavirus 2 NEGATIVE NEGATIVE Final    Comment: (NOTE) SARS-CoV-2 target nucleic acids are NOT DETECTED.  The SARS-CoV-2 RNA is generally detectable in upper and lower respiratory specimens during the acute phase of infection. The lowest concentration of SARS-CoV-2 viral copies this assay can detect is 250 copies / mL. A negative result does not preclude SARS-CoV-2 infection and should not be used as the sole basis for treatment or other patient management decisions.  A negative result may occur with improper specimen collection / handling, submission of specimen other than nasopharyngeal swab, presence of viral mutation(s) within the areas targeted by this assay, and inadequate number of viral copies (<250 copies / mL). A negative result must be combined with clinical observations, patient history, and epidemiological information.  Fact Sheet for Patients:   StrictlyIdeas.no  Fact Sheet for Healthcare Providers: BankingDealers.co.za  This test is not yet approved or  cleared by the [place] FDA and has been authorized for detection and/or diagnosis of SARS-CoV-2 by FDA under an Emergency Use Authorization (EUA).  This EUA will remain in effect (meaning this test can be used) for the duration of the COVID-19 declaration under Section 564(b)(1) of the Act, 21 U.S.C. section 360bbb-3(b)(1), unless the authorization is terminated or revoked sooner.  Performed at [facility], [address], [place], [place] [address]     Radiology Studies: No results found.  Scheduled Meds: . amantadine  100 mg Oral BID  . amLODipine  5 mg Oral Daily  . cloNIDine  0.1 mg Transdermal Weekly  . divalproex  1,000 mg Oral Q12H  . FLUoxetine  40 mg Oral Daily  . haloperidol  2 mg Oral TID  . heparin  5,000 Units Subcutaneous Q8H  . lamoTRIgine  50 mg Oral BID  . levETIRAcetam  500 mg Oral BID  . memantine  5 mg Oral QHS   Continuous Infusions:    LOS: 8 days  -Patient is independent with ADLs, ambulatory  -Patient is medically stable for discharge to inpatient psychiatric/behavioral unit when bed becomes available  Roxan Hockey, MD [facility]   If 7PM-7AM, please contact night-coverage [email or web address]  07/12/2020, 7:37 PM

## 2020-07-12 NOTE — BHH Counselor (Addendum)
TTS contacted [phone] to set up tele-psych. RN unavailable at this time. Lilia Pro, Network engineer agrees to give this counselor's number to RN when patient is ready to be assessed.  @[phone]  RN called back. She states patient has been agitated and is currently confined to his room sleeping. She states she will contact TTS when alert. TTS will call back if we have not hear from her in a couple hours.

## 2020-07-13 DIAGNOSIS — Z9114 Patient's other noncompliance with medication regimen: Secondary | ICD-10-CM | POA: Diagnosis not present

## 2020-07-13 DIAGNOSIS — F250 Schizoaffective disorder, bipolar type: Secondary | ICD-10-CM | POA: Diagnosis not present

## 2020-07-13 DIAGNOSIS — A5217 General paresis: Secondary | ICD-10-CM | POA: Diagnosis not present

## 2020-07-13 DIAGNOSIS — G9341 Metabolic encephalopathy: Secondary | ICD-10-CM | POA: Diagnosis not present

## 2020-07-13 NOTE — Progress Notes (Signed)
Patient ID: Henry Smith, male   DOB: March 15, 1941, 79 y.o.   MRN: [id number]  This is a follow-up evaluation via tele-psych for [name], a 79 year old AA male with hx of multiple chronic medical issues & mental illness. He apparently was brought to the ED due to altered mental status. A chart review has shown that patient is on multiple medications for mental illness & other medical problems. During this evaluation, [name] presents alert, non-verbal, rather was nodding his head when asked questions. The nurse reports that patient is currently refusing to take medications. Is displaying some behavioral issues such as, last night, patient ran out of his room into the hallway screaming. He denies SIHI, AVH, delusional thoughts or paranoia by nodding his head. He is currently sitting up on a chair. Does not appear to be in any apparent distress. He is making good eye contact.  Disposition: Patient continues to present with altered mental status.  Continue with inpatient hospitalization recommendation.  Reviewed current medications, apparently non-adherent to his medication regimen remain an issues.  No medication changes or adjustments at this time. [place] will continued to look for inpatient geri-psych treatment.

## 2020-07-13 NOTE — Progress Notes (Signed)
PROGRESS NOTE    Henry Smith  NFA:[id number] DOB: 1941/03/23 DOA: 07/03/2020 PCP: Caprice Renshaw, MD    Brief Narrative:  79 year old male with a history of neurosyphilis, dementia, schizoaffective disorder, COPD, thrombocytopenia, seizure disorder, who is a long-term resident of a nursing facility, was brought to the hospital with progressive changes in mental status.  Patient had been coming increasingly aggressive, was refusing any medications, becoming paranoid.  More recently, staff had noted that he was drinking his urine.  He was evaluated in the emergency room and felt that he may have a urinary tract infection was admitted to the hospital service.  Further review of urine studies indicate that his positive urine culture is likely a colonization and not a true infection.  He did not have any other signs of active infection.  Discussed with infectious disease who agreed with not treating with antibiotics.  It was felt that his progressive decline in mental status was related to his underlying behavioral health.  Seen by psychiatry with recommendations for inpatient psychiatry.  He is involuntarily committed.  Patient is medically stable for discharge whenever he can be placed by psychiatry. -Patient is independent with ADLs, ambulatory  -Patient is medically stable for discharge to inpatient psychiatric/behavioral unit when bed becomes available  Assessment & Plan:   Active Problems:   Schizoaffective disorder, bipolar type (HCC)   Thrombocytopenia (HCC)   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HCC)   Seizure ([place])   Noncompliance with medication regimen   Acute metabolic encephalopathy   1)Progressive behavioral changes---  Initially felt to be related to possible UTI, but patient likely has colonization and not a true urinary tract infection.  He has been refusing his medications for weeks to months.  He is on several psychotropic medications such as Depakote, Haldol, Ativan.  He  also takes Lamictal, Keppra for seizures.  His behavior changes appear to be chronic progressive in nature and appear to be secondary to his underlying mental illness.  Other work-up including CT imaging of head, basic chemistry, ammonia, TSH, ABG were unrevealing.  Appreciate psychiatry input.  Recommendations are for inpatient psychiatry.  He is currently under involuntary commitment.---- -Patient is medically stable for discharge to inpatient psychiatric/behavioral unit when bed becomes available  2)ESBL E coli  in urine--  Discussed with infectious disease, Dr. Drucilla Schmidt.  With no leukocytosis, fever, dysuria, unlikely to be an active infection.  Felt to be more related to colonization.  Antibiotics were not recommended in this situation.  3)Dementia associate with neurosyphilis---  Review of records indicate that he was treated for neurosyphilis in 2018.  He is chronically on Namenda which he has been refusing.  4)Thrombocytopenia.  Appears to be a chronic issue.  5)Seizure disorder.  Chronically on Keppra and Lamictal.  He has been refusing these medications.  He has IM Ativan as needed ordered if he has any breakthrough seizures.  6)Noncompliance.  Patient has been refusing medications for several weeks to months at this point.    7)HTN--BP is not at goal partly due to poor compliance with medications will try clonidine patch once weekly to try to improve compliance -Continue amlodipine 5 mg daily when patient agrees to take it   DVT prophylaxis: heparin injection 5,000 Units Start: 07/04/20 0600 SCDs Start: 07/04/20 0130  Code Status: full code Family Communication: left VM for legal guardian Loa Socks 7/22 Disposition Plan: Status is: Inpatient  Remains inpatient appropriate because:Unsafe d/c plan   Dispo: The patient is from: SNF  Anticipated d/c is to: Inpatient psychiatry              Anticipated d/c date is: 1 day              Patient currently is medically  stable to d/c. --- -Patient is medically stable for discharge to inpatient psychiatric/behavioral unit when bed becomes available   Consultants:   Psychiatry  Procedures:     Antimicrobials:      Subjective: -Resting comfortably --Continues to have episodes where he refuses to take his medications and when he refuses to be cooperative with staff  --No violence and No severe agitation  ---Patient is independent with ADLs, ambulatory -Patient is medically stable for discharge to inpatient psychiatric/behavioral unit when bed becomes available  Objective: Vitals:   07/12/20 2013 07/13/20 0603 07/13/20 0626 07/13/20 0852  BP:  (!) 154/110 (!) 139/70 (!) 143/77  Pulse:  102 68 70  Resp:  16    Temp:  97.6 F (36.4 C)    TempSrc:      SpO2: 100% 100%    Weight:      Height:        Intake/Output Summary (Last 24 hours) at 07/13/2020 1427 Last data filed at 07/13/2020 1211 Gross per 24 hour  Intake 1200 ml  Output --  Net 1200 ml   Filed Weights   07/03/20 1504 07/04/20 0200  Weight: 92.5 kg 97.3 kg    Examination:  General exam: Alert, awake, oriented x 3 Respiratory system: Clear to auscultation. Respiratory effort normal. Cardiovascular system:RRR. No murmurs, rubs, gallops. Neuro--ambulating around in the room, overall steady, no obvious new focal deficits Psych--- psychosis, noncompliance persist with episodes of bizarre behavior like to drinking his own urine  Data Reviewed:   CBC: Recent Labs  Lab 07/12/20 0438  WBC 2.5*  HGB 12.4*  HCT 40.3  MCV 90.4  PLT 132*   Basic Metabolic Panel: Recent Labs  Lab 07/12/20 0438  NA 140  K 4.1  CL 105  CO2 26  GLUCOSE 86  BUN 16  CREATININE 0.72  CALCIUM 9.7   GFR: Estimated Creatinine Clearance: 92 mL/min (by C-G formula based on SCr of 0.72 mg/dL). Liver Function Tests: Recent Labs  Lab 07/12/20 0438  AST 16  ALT 20  ALKPHOS 58  BILITOT 0.3  PROT 6.9  ALBUMIN 3.5   No results for  input(s): LIPASE, AMYLASE in the last 168 hours. No results for input(s): AMMONIA in the last 168 hours. Coagulation Profile: No results for input(s): INR, PROTIME in the last 168 hours. Cardiac Enzymes: No results for input(s): CKTOTAL, CKMB, CKMBINDEX, TROPONINI in the last 168 hours. BNP (last 3 results) No results for input(s): PROBNP in the last 8760 hours. HbA1C: No results for input(s): HGBA1C in the last 72 hours. CBG: No results for input(s): GLUCAP in the last 168 hours. Lipid Profile: No results for input(s): CHOL, HDL, LDLCALC, TRIG, CHOLHDL, LDLDIRECT in the last 72 hours. Thyroid Function Tests: No results for input(s): TSH, T4TOTAL, FREET4, T3FREE, THYROIDAB in the last 72 hours. Anemia Panel: No results for input(s): VITAMINB12, FOLATE, FERRITIN, TIBC, IRON, RETICCTPCT in the last 72 hours. Sepsis Labs: No results for input(s): PROCALCITON, LATICACIDVEN in the last 168 hours.  Recent Results (from the past 240 hour(s))  Urine Culture     Status: Abnormal   Collection Time: 07/03/20  3:10 PM   Specimen: Urine, Catheterized  Result Value Ref Range Status   Specimen Description   Final  URINE, CATHETERIZED Performed at [facility], [address], [place], [place] [address]    Special Requests   Final    NONE Performed at [facility], [address], [place], [place] [address]    Culture (A)  Final    >=100,000 COLONIES/mL ESCHERICHIA COLI Confirmed Extended Spectrum Beta-Lactamase Producer (ESBL).  In bloodstream infections from ESBL organisms, carbapenems are preferred over piperacillin/tazobactam. They are shown to have a lower risk of mortality.    Report Status 07/07/2020 FINAL  Final   Organism ID, Bacteria ESCHERICHIA COLI (A)  Final      Susceptibility   Escherichia coli - MIC*    AMPICILLIN >=32 RESISTANT Resistant     CEFAZOLIN >=64 RESISTANT Resistant     CEFTRIAXONE >=64 RESISTANT Resistant     CIPROFLOXACIN >=4 RESISTANT Resistant      GENTAMICIN <=1 SENSITIVE Sensitive     IMIPENEM <=0.25 SENSITIVE Sensitive     NITROFURANTOIN <=16 SENSITIVE Sensitive     TRIMETH/SULFA >=320 RESISTANT Resistant     AMPICILLIN/SULBACTAM >=32 RESISTANT Resistant     PIP/TAZO >=128 RESISTANT Resistant     * >=100,000 COLONIES/mL ESCHERICHIA COLI  SARS Coronavirus 2 by RT PCR (hospital order, performed in [facility] hospital lab) Nasopharyngeal Nasopharyngeal Swab     Status: None   Collection Time: 07/03/20  8:24 PM   Specimen: Nasopharyngeal Swab  Result Value Ref Range Status   SARS Coronavirus 2 NEGATIVE NEGATIVE Final    Comment: (NOTE) SARS-CoV-2 target nucleic acids are NOT DETECTED.  The SARS-CoV-2 RNA is generally detectable in upper and lower respiratory specimens during the acute phase of infection. The lowest concentration of SARS-CoV-2 viral copies this assay can detect is 250 copies / mL. A negative result does not preclude SARS-CoV-2 infection and should not be used as the sole basis for treatment or other patient management decisions.  A negative result may occur with improper specimen collection / handling, submission of specimen other than nasopharyngeal swab, presence of viral mutation(s) within the areas targeted by this assay, and inadequate number of viral copies (<250 copies / mL). A negative result must be combined with clinical observations, patient history, and epidemiological information.  Fact Sheet for Patients:   StrictlyIdeas.no  Fact Sheet for Healthcare Providers: BankingDealers.co.za  This test is not yet approved or  cleared by the [place] FDA and has been authorized for detection and/or diagnosis of SARS-CoV-2 by FDA under an Emergency Use Authorization (EUA).  This EUA will remain in effect (meaning this test can be used) for the duration of the COVID-19 declaration under Section 564(b)(1) of the Act, 21 U.S.C. section 360bbb-3(b)(1), unless  the authorization is terminated or revoked sooner.  Performed at [facility], [address] [place],  [address]     Radiology Studies: No results found.  Scheduled Meds:  amantadine  100 mg Oral BID   amLODipine  5 mg Oral Daily   cloNIDine  0.1 mg Transdermal Weekly   divalproex  1,000 mg Oral Q12H   FLUoxetine  40 mg Oral Daily   haloperidol  2 mg Oral TID   heparin  5,000 Units Subcutaneous Q8H   lamoTRIgine  50 mg Oral BID   levETIRAcetam  500 mg Oral BID   memantine  5 mg Oral QHS   Continuous Infusions:    LOS: 9 days  -Patient is independent with ADLs, ambulatory  -Patient is medically stable for discharge to inpatient psychiatric/behavioral unit when bed becomes available  Roxan Hockey, MD  [facility]   If 7PM-7AM, please contact night-coverage [email or web address]  07/13/2020, 2:27 PM

## 2020-07-13 NOTE — Progress Notes (Signed)
Patient began to yelled out and walked out  from room that he was leaving going down stairs to see his mother. Attempted to redirect patient back to room but was unsuccessful. Retail banker.patient stated we wold have to kill him before he goes back to his room .Security called Engineer, structural to floor. Patient then directed back to room. Nurse gave prn IM Ativan refer to mar for correct dose to right deltoid. Patient now sitting in chair. Sitter outside of room. Will continue to assess throughout shift.

## 2020-07-13 NOTE — Progress Notes (Signed)
Patient refused to take medications this am. Educated patient on taking medications. Patient still refused. Sitter sitting outside of room. telepsych wanted to speak with patient nurse rolled camera to patients room. Patient refused to speak to telepsych he would only shake his head no. Nurse spoke with telepysch and stated that the patient does talk and has been refusing medication. Patient sitting in chair at this time. Will continue to assess throughout shift.

## 2020-07-13 NOTE — Progress Notes (Signed)
Patient pacing back and forth in room. Upon entering room patient stated he would like chocolate milk with his medications. When given medications, patient sat the medications down and would not take them, pt stated "youre trying to kill me, satan's eyes are the same color as yours." pt refusing for vital signs to be obtained. Requested for patient to sit down in chair, pt was agreeable and sat down. Sitter outside of room monitoring patient.

## 2020-07-13 NOTE — Progress Notes (Signed)
Family at bedside. 

## 2020-07-14 DIAGNOSIS — F0280 Dementia in other diseases classified elsewhere without behavioral disturbance: Secondary | ICD-10-CM | POA: Diagnosis not present

## 2020-07-14 DIAGNOSIS — Z9114 Patient's other noncompliance with medication regimen: Secondary | ICD-10-CM | POA: Diagnosis not present

## 2020-07-14 DIAGNOSIS — A5217 General paresis: Secondary | ICD-10-CM | POA: Diagnosis not present

## 2020-07-14 DIAGNOSIS — D696 Thrombocytopenia, unspecified: Secondary | ICD-10-CM | POA: Diagnosis not present

## 2020-07-14 NOTE — Progress Notes (Signed)
Patient took medicine that he refused this am  with this nurse and MD (Dr. Joesph Fillers) at this time. Patient took them with no problem. Will continue to monitor throughout shift.

## 2020-07-14 NOTE — Progress Notes (Signed)
Patient refused to take medicines this morning for this nurse. This nurse told patient he took the same medicines last night for the night shift nurse patient stated "no I didn't". Patient sitting in chair this morning with shirt on but no pants asked patient if he was going to put his pants he stated no. Sitter out side of room. Will continue to assess throughout shift.

## 2020-07-14 NOTE — Progress Notes (Signed)
PROGRESS NOTE    Henry Smith  FFM:[id number] DOB: [birth year] DOA: 07/03/2020 PCP: Caprice Renshaw, MD    Brief Narrative:  79 year old male with a history of neurosyphilis, dementia, schizoaffective disorder, COPD, thrombocytopenia, seizure disorder, who is a long-term resident of a nursing facility, was brought to the hospital with progressive changes in mental status.  Patient had been coming increasingly aggressive, was refusing any medications, becoming paranoid.  More recently, staff had noted that he was drinking his urine.  He was evaluated in the emergency room and felt that he may have a urinary tract infection was admitted to the hospital service.  Further review of urine studies indicate that his positive urine culture is likely a colonization and not a true infection.  He did not have any other signs of active infection.  Discussed with infectious disease who agreed with not treating with antibiotics.  It was felt that his progressive decline in mental status was related to his underlying behavioral health.  Seen by psychiatry with recommendations for inpatient psychiatry.  He is involuntarily committed.  Patient is medically stable for discharge whenever he can be placed by psychiatry. -Patient is independent with ADLs, ambulatory  -Patient is medically stable for discharge to inpatient psychiatric/behavioral unit when bed becomes available  Assessment & Plan:   Active Problems:   Schizoaffective disorder, bipolar type (HCC)   Thrombocytopenia (HCC)   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HCC)   Seizure ([place])   Noncompliance with medication regimen   Acute metabolic encephalopathy   1)Progressive behavioral changes---  Initially felt to be related to possible UTI, but patient likely has colonization and not a true urinary tract infection.  He has been refusing his medications for weeks to months.  He is on several psychotropic medications such as Depakote, Haldol, Ativan.  He  also takes Lamictal, Keppra for seizures.  His behavior changes appear to be chronic progressive in nature and appear to be secondary to his underlying mental illness.  Other work-up including CT imaging of head, basic chemistry, ammonia, TSH, ABG were unrevealing.  Appreciate psychiatry input.  Recommendations are for inpatient psychiatry.  He is currently under involuntary commitment.---- -Patient is medically stable for discharge to inpatient psychiatric/behavioral unit when bed becomes available   --- Poor compliance with medications and persistent psychotic type behavior disturbance persist  2)ESBL E coli  in urine--  Discussed with infectious disease, Dr. Drucilla Schmidt.  With no leukocytosis, fever, dysuria, unlikely to be an active infection.  Felt to be more related to colonization.  Antibiotics were not recommended in this situation.  3)Dementia Associate with Neurosyphilis---  Review of records indicate that he was treated for neurosyphilis in 2018.  He is chronically on Namenda which he has been refusing.  4)Thrombocytopenia--patient with some degree of mild pancytopenia which is stable--  Appears to be a chronic issue.  5)Seizure disorder.  Chronically on Keppra and Lamictal.  He has been refusing these medications.  He has IM Ativan as needed ordered if he has any breakthrough seizures.  6)Noncompliance.  Patient has been refusing medications for several weeks to months at this point.    7)HTN--BP is not at goal partly due to poor compliance with medications will try clonidine patch once weekly to try to improve compliance -Continue amlodipine 5 mg daily when patient agrees to take it   DVT prophylaxis: heparin injection 5,000 Units Start: 07/04/20 0600 SCDs Start: 07/04/20 0130  Code Status: full code Family Communication: left VM for legal guardian Loa Socks 7/22 Disposition  Plan: Status is: Inpatient  Remains inpatient appropriate because:Unsafe d/c plan   Dispo: The  patient is from: SNF              Anticipated d/c is to: Inpatient psychiatry              Anticipated d/c date is: 1 day              Patient currently is medically stable to d/c. --- -Patient is medically stable for discharge to inpatient psychiatric/behavioral unit when bed becomes available  Consultants:   Psychiatry  Procedures:     Antimicrobials:      Subjective: -Resting comfortably --Continues to have episodes where he refuses to take his medications and when he refuses to be cooperative with staff  --No violence and No severe agitation  ---Patient is independent with ADLs, ambulatory -Patient is medically stable for discharge to inpatient psychiatric/behavioral unit when bed becomes available  Objective: Vitals:   07/12/20 2013 07/13/20 0603 07/13/20 0626 07/13/20 0852  BP:  (!) 154/110 (!) 139/70 (!) 143/77  Pulse:  102 68 70  Resp:  16    Temp:  97.6 F (36.4 C)    TempSrc:      SpO2: 100% 100%    Weight:      Height:        Intake/Output Summary (Last 24 hours) at 07/14/2020 1643 Last data filed at 07/14/2020 1535 Gross per 24 hour  Intake 1920 ml  Output --  Net 1920 ml   Filed Weights   07/03/20 1504 07/04/20 0200  Weight: 92.5 kg 97.3 kg    Examination:  General exam: Alert, awake, oriented x 3 Respiratory system: Clear to auscultation. Respiratory effort normal. Cardiovascular system:RRR. No murmurs, rubs, gallops. Neuro--ambulating around in the room, overall steady, no obvious new focal deficits Psych--- psychosis, noncompliance persist with episodes of bizarre behavior like to drinking his own urine and paranoid statements  Data Reviewed:   CBC: Recent Labs  Lab 07/12/20 0438  WBC 2.5*  HGB 12.4*  HCT 40.3  MCV 90.4  PLT 275*   Basic Metabolic Panel: Recent Labs  Lab 07/12/20 0438  NA 140  K 4.1  CL 105  CO2 26  GLUCOSE 86  BUN 16  CREATININE 0.72  CALCIUM 9.7   GFR: Estimated Creatinine Clearance: 92 mL/min (by  C-G formula based on SCr of 0.72 mg/dL). Liver Function Tests: Recent Labs  Lab 07/12/20 0438  AST 16  ALT 20  ALKPHOS 58  BILITOT 0.3  PROT 6.9  ALBUMIN 3.5   No results for input(s): LIPASE, AMYLASE in the last 168 hours. No results for input(s): AMMONIA in the last 168 hours. Coagulation Profile: No results for input(s): INR, PROTIME in the last 168 hours. Cardiac Enzymes: No results for input(s): CKTOTAL, CKMB, CKMBINDEX, TROPONINI in the last 168 hours. BNP (last 3 results) No results for input(s): PROBNP in the last 8760 hours. HbA1C: No results for input(s): HGBA1C in the last 72 hours. CBG: No results for input(s): GLUCAP in the last 168 hours. Lipid Profile: No results for input(s): CHOL, HDL, LDLCALC, TRIG, CHOLHDL, LDLDIRECT in the last 72 hours. Thyroid Function Tests: No results for input(s): TSH, T4TOTAL, FREET4, T3FREE, THYROIDAB in the last 72 hours. Anemia Panel: No results for input(s): VITAMINB12, FOLATE, FERRITIN, TIBC, IRON, RETICCTPCT in the last 72 hours. Sepsis Labs: No results for input(s): PROCALCITON, LATICACIDVEN in the last 168 hours.  No results found for this or any previous visit (  from the past 240 hour(s)).  Radiology Studies: No results found.  Scheduled Meds:  amantadine  100 mg Oral BID   amLODipine  5 mg Oral Daily   cloNIDine  0.1 mg Transdermal Weekly   divalproex  1,000 mg Oral Q12H   FLUoxetine  40 mg Oral Daily   haloperidol  2 mg Oral TID   heparin  5,000 Units Subcutaneous Q8H   lamoTRIgine  50 mg Oral BID   levETIRAcetam  500 mg Oral BID   memantine  5 mg Oral QHS   Continuous Infusions:    LOS: 10 days  -Patient is independent with ADLs, ambulatory  -Patient is medically stable for discharge to inpatient psychiatric/behavioral unit when bed becomes available  Roxan Hockey, MD [facility]   If 7PM-7AM, please contact night-coverage [email or web address]  07/14/2020, 4:43 PM

## 2020-07-14 NOTE — Progress Notes (Signed)
CSW contacted [employer] [phone]) option 1 to check status of authorization (originally faxed on 7/30). CSW spoke with Whitney in access department. She reports that she is able to see that pt's referral was processed, but no authorization number was provided. Whitney emailed the Utilization Review processor who reviewed this referral Cherly Hensen) and provided her with CSW's contact information. She unfortunately does not work on weekends and will likely respond on Monday, 8/2. Authorization number is needed to fax pt's referral to [facility] and [facility]. CSW continuing to access.   CSW also re-faxed pt's referral to the following gero-psych facilities:  [facility]-no beds available today [place]   TTS will continue to assess for placement.   Analis Distler S. Ouida Sills, MSW, LCSW Clinical Social Worker 07/14/2020 9:19 AM

## 2020-07-15 NOTE — Progress Notes (Signed)
Patient refused to let lab draw blood. Per lab tech patient told her to get the F out of his room. MD made aware.

## 2020-07-15 NOTE — BH Assessment (Signed)
Per Mordecai Maes, NP, Inpatient psychiatric admission to a geropsychiatric facility continues to be recommended. Confirmed with Maudie Mercury at [facility] that patient remains on the medical wait list. Will continue to confirm patient's status on the [facility] list, daily.

## 2020-07-15 NOTE — Progress Notes (Signed)
Patient ID: Henry Smith, male   DOB: Oct 02, 1941, 79 y.o.   MRN: [id number]   Psychiatric reassessment  In brief; Henry Smith is a 79 year old male with a history of neurosyphilis, dementia, schizoaffective disorder, COPD, thrombocytopenia, seizure disorder, who is a long-term resident of a nursing facility,  Patient presented to [facility] following changes in mental status and aggressive behavior so a psychiatric consult was placed. Since being in the hospital, patient has had periods of agitation, periods where he refuses to take his medications, becoming paranoid and more recently, staff had noted that he was drinking his urine.   During this evaluation patient was noted lying in bed. Prior to the start of the evaluation, patient could be heard speaking to hospital staff however, when writer attempted to begin the evaluation, patient refused to participate. Per nursing, patient did take his medication today. She did however state that patient declined to have lab work and told staff to," f" off. Besides that, she stated that patient has not had any significant behavioral issues today.   Disposition: Inpatient psychiatric admission to a geropsychiatric facility continues to be recommended..Per review of chart, patient is medically stable for discharge to inpatient psychiatric/behavioral unit when bed becomes available and patient is independent with ADLs, ambulatory. TTS will continue to seek placement.

## 2020-07-15 NOTE — Progress Notes (Signed)
Patient out of room in the hall screaming that he wanted food. Patient refused to go back into room. Security called. Patient escorted back to his room.  IM ativan given. Patient given snacks and a drink. No other complaints at this time.

## 2020-07-15 NOTE — Progress Notes (Signed)
PROGRESS NOTE    Henry Smith  BZJ:[id number] DOB: [birth year] DOA: 07/03/2020 PCP: Caprice Renshaw, MD    Brief Narrative:  79 year old male with a history of neurosyphilis, dementia, schizoaffective disorder, COPD, thrombocytopenia, seizure disorder, who is a long-term resident of a nursing facility, was brought to the hospital with progressive changes in mental status.  Patient had been coming increasingly aggressive, was refusing any medications, becoming paranoid.  More recently, staff had noted that he was drinking his urine.  He was evaluated in the emergency room and felt that he may have a urinary tract infection was admitted to the hospital service.  Further review of urine studies indicate that his positive urine culture is likely a colonization and not a true infection.  He did not have any other signs of active infection.  Discussed with infectious disease who agreed with not treating with antibiotics.  It was felt that his progressive decline in mental status was related to his underlying behavioral health.  Seen by psychiatry with recommendations for inpatient psychiatry.  He is involuntarily committed.  Patient is medically stable for discharge whenever he can be placed by psychiatry. -Patient is independent with ADLs, ambulatory  -Patient is medically stable for discharge to inpatient psychiatric/behavioral unit when bed becomes available  Assessment & Plan:   Active Problems:   Schizoaffective disorder, bipolar type (HCC)   Thrombocytopenia (HCC)   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HCC)   Seizure ([place])   Noncompliance with medication regimen   Acute metabolic encephalopathy   1)Progressive behavioral changes---  Initially felt to be related to possible UTI, but patient likely has colonization and not a true urinary tract infection.  He has been refusing his medications for weeks to months.  He is on several psychotropic medications such as Depakote, Haldol, Ativan.  He  also takes Lamictal, Keppra for seizures.  His behavior changes appear to be chronic progressive in nature and appear to be secondary to his underlying mental illness.  Other work-up including CT imaging of head, basic chemistry, ammonia, TSH, ABG were unrevealing.  Appreciate psychiatry input.  Recommendations are for inpatient psychiatry.  He is currently under involuntary commitment.---- - Inpatient psychiatric admission to a geropsychiatric facility continues to be recommended -Patient is medically stable for discharge to inpatient psychiatric/behavioral unit when bed becomes available   --- Poor compliance with medications and persistent psychotic type behavior disturbance persist  2)ESBL E coli  in urine--  Discussed with infectious disease, Dr. Drucilla Schmidt.  With no leukocytosis, fever, dysuria, unlikely to be an active infection.  Felt to be more related to colonization.  Antibiotics were not recommended in this situation.  3)Dementia Associate with Neurosyphilis---  Review of records indicate that he was treated for neurosyphilis in 2018.  He is chronically on Namenda which he has been refusing.  4)Thrombocytopenia--patient with some degree of mild pancytopenia which is stable--  Appears to be a chronic issue.  5)Seizure disorder.  Chronically on Keppra and Lamictal.  He has been refusing these medications.  He has IM Ativan as needed ordered if he has any breakthrough seizures.  6)Noncompliance.  Patient has been refusing medications for several weeks to months at this point.    7)HTN--BP is not at goal partly due to poor compliance with medications will try clonidine patch once weekly to try to improve compliance -Continue amlodipine 5 mg daily when patient agrees to take it   DVT prophylaxis: heparin injection 5,000 Units Start: 07/04/20 0600 SCDs Start: 07/04/20 0130  Code Status: full  code Family Communication: left VM for legal guardian Loa Socks  , spoke with brother and  Niece Disposition Plan: Status is: Inpatient  Remains inpatient appropriate because:Unsafe d/c plan   Dispo: The patient is from: SNF              Anticipated d/c is to: Inpatient psychiatry              Anticipated d/c date is: 1 day              Patient currently is medically stable to d/c. ---- Inpatient psychiatric admission to a geropsychiatric facility continues to be recommended -Patient is medically stable for discharge to inpatient psychiatric/behavioral unit when bed becomes available  Consultants:   Psychiatry  Procedures:     Antimicrobials:      Subjective: -Resting comfortably --Continues to have episodes where he refuses to take his medications and when he refuses to be cooperative with staff  --No violence and No severe agitation  ---Patient is independent with ADLs, ambulatory -Patient is medically stable for discharge to inpatient psychiatric/behavioral unit when bed becomes available  07/15/20--patient took his a.m. medications with some persuasion    Objective: Vitals:   07/13/20 0852 07/14/20 1709 07/14/20 2312 07/15/20 1110  BP: (!) 143/77 (!) 134/85 119/83 (!) 165/93  Pulse: 70  100   Resp:   20   Temp:      TempSrc:      SpO2:   99%   Weight:      Height:        Intake/Output Summary (Last 24 hours) at 07/15/2020 1503 Last data filed at 07/15/2020 1500 Gross per 24 hour  Intake 1760 ml  Output --  Net 1760 ml   Filed Weights   07/03/20 1504 07/04/20 0200  Weight: 92.5 kg 97.3 kg    Examination:  General exam: Alert, awake, oriented x 3 Respiratory system: Clear to auscultation. Respiratory effort normal. Cardiovascular system:RRR. No murmurs, rubs, gallops. Neuro--ambulating around in the room, overall steady, no obvious new focal deficits Psych--- psychosis, noncompliance persist with episodes of bizarre behavior like to drinking his own urine and paranoid statements  Data Reviewed:   CBC: Recent Labs  Lab 07/12/20 0438   WBC 2.5*  HGB 12.4*  HCT 40.3  MCV 90.4  PLT 867*   Basic Metabolic Panel: Recent Labs  Lab 07/12/20 0438  NA 140  K 4.1  CL 105  CO2 26  GLUCOSE 86  BUN 16  CREATININE 0.72  CALCIUM 9.7   GFR: Estimated Creatinine Clearance: 92 mL/min (by C-G formula based on SCr of 0.72 mg/dL). Liver Function Tests: Recent Labs  Lab 07/12/20 0438  AST 16  ALT 20  ALKPHOS 58  BILITOT 0.3  PROT 6.9  ALBUMIN 3.5   No results for input(s): LIPASE, AMYLASE in the last 168 hours. No results for input(s): AMMONIA in the last 168 hours. Coagulation Profile: No results for input(s): INR, PROTIME in the last 168 hours. Cardiac Enzymes: No results for input(s): CKTOTAL, CKMB, CKMBINDEX, TROPONINI in the last 168 hours. BNP (last 3 results) No results for input(s): PROBNP in the last 8760 hours. HbA1C: No results for input(s): HGBA1C in the last 72 hours. CBG: No results for input(s): GLUCAP in the last 168 hours. Lipid Profile: No results for input(s): CHOL, HDL, LDLCALC, TRIG, CHOLHDL, LDLDIRECT in the last 72 hours. Thyroid Function Tests: No results for input(s): TSH, T4TOTAL, FREET4, T3FREE, THYROIDAB in the last 72 hours. Anemia Panel: No  results for input(s): VITAMINB12, FOLATE, FERRITIN, TIBC, IRON, RETICCTPCT in the last 72 hours. Sepsis Labs: No results for input(s): PROCALCITON, LATICACIDVEN in the last 168 hours.  No results found for this or any previous visit (from the past 240 hour(s)).  Radiology Studies: No results found.  Scheduled Meds: . amantadine  100 mg Oral BID  . amLODipine  5 mg Oral Daily  . cloNIDine  0.1 mg Transdermal Weekly  . divalproex  1,000 mg Oral Q12H  . FLUoxetine  40 mg Oral Daily  . haloperidol  2 mg Oral TID  . heparin  5,000 Units Subcutaneous Q8H  . lamoTRIgine  50 mg Oral BID  . levETIRAcetam  500 mg Oral BID  . memantine  5 mg Oral QHS   Continuous Infusions:    LOS: 11 days     -Patient is independent with ADLs,  ambulatory  - - Inpatient psychiatric admission to a geropsychiatric facility continues to be recommended  -Patient is medically stable for discharge to inpatient psychiatric/behavioral unit when bed becomes available  Roxan Hockey, MD [facility]   If 7PM-7AM, please contact night-coverage [email or web address]  07/15/2020, 3:03 PM

## 2020-07-16 MED ORDER — CLONIDINE HCL 0.2 MG/24HR TD PTWK
0.2000 mg | MEDICATED_PATCH | TRANSDERMAL | Status: DC
  Administered 2020-07-25 – 2020-09-05 (×4): 0.2 mg via TRANSDERMAL
  Filled 2020-07-16 (×6): qty 1

## 2020-07-16 NOTE — Plan of Care (Signed)

## 2020-07-16 NOTE — BH Assessment (Signed)
Reassessment Note: Pt present lying in bed. He is alert and engages readily in conversation. Pt denies SI & HI.  He denies AVH & states "I only hear your sweet voice".  Pt reports he only has one question, where can he find some warm and tender affection? Inpatient geropsychiatric tx continues to be recommended.

## 2020-07-16 NOTE — BH Assessment (Addendum)
Per reassessed by TTS today and Inpatient geropsychiatric tx continues to be recommended. Patient has been declined by several gero-psych facilities due to medical acuity. A state hospital referral form has been completed as of 07/12/2020 and patient is on the wait list.    Clinician verified with Baxter Flattery (Intake Coordinator) at [facility] that patient remains on the [facility] Geriatric wait list.

## 2020-07-16 NOTE — Progress Notes (Signed)
PROGRESS NOTE    Henry Smith  [name] DOB: Apr 27, 1941 DOA: 07/03/2020 PCP: Caprice Renshaw, MD    Brief Narrative:  79 year old male with a history of neurosyphilis, dementia, schizoaffective disorder, COPD, thrombocytopenia, seizure disorder, who is a long-term resident of a nursing facility, was brought to the hospital with progressive changes in mental status.  Patient had been coming increasingly aggressive, was refusing any medications, becoming paranoid.  More recently, staff had noted that he was drinking his urine.  He was evaluated in the emergency room and felt that he may have a urinary tract infection was admitted to the hospital service.  Further review of urine studies indicate that his positive urine culture is likely a colonization and not a true infection.  He did not have any other signs of active infection.  Discussed with infectious disease who agreed with not treating with antibiotics.  It was felt that his progressive decline in mental status was related to his underlying behavioral health.  Seen by psychiatry with recommendations for inpatient psychiatry.  He is involuntarily committed.  Patient is medically stable for discharge whenever he can be placed by psychiatry. -Patient is independent with ADLs, ambulatory  -Patient is medically stable for discharge to inpatient psychiatric/behavioral unit when bed becomes available  Assessment & Plan:   Active Problems:   Schizoaffective disorder, bipolar type (HCC)   Thrombocytopenia (HCC)   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HCC)   Seizure ([place])   Noncompliance with medication regimen   Acute metabolic encephalopathy   1)Progressive behavioral changes---  Initially felt to be related to possible UTI, but patient likely has colonization and not a true urinary tract infection.  He has been refusing his medications for weeks to months.  He is on several psychotropic medications such as Depakote, Haldol, Ativan.  He  also takes Lamictal, Keppra for seizures.  His behavior changes appear to be chronic progressive in nature and appear to be secondary to his underlying mental illness.  Other work-up including CT imaging of head, basic chemistry, ammonia, TSH, ABG were unrevealing.  Appreciate psychiatry input.  Recommendations are for inpatient psychiatry.  He is currently under involuntary commitment.---- - Inpatient psychiatric admission to a geropsychiatric facility continues to be recommended -Patient is medically stable for discharge to inpatient psychiatric/behavioral unit when bed becomes available   --- Poor compliance with medications and persistent psychotic type behavior disturbance persist  2)ESBL E coli  in urine--  Discussed with infectious disease, Dr. Drucilla Schmidt.  With no leukocytosis, fever, dysuria, unlikely to be an active infection.  Felt to be more related to colonization.  Antibiotics were not recommended in this situation.  3)Dementia Associate with Neurosyphilis---  Review of records indicate that he was treated for neurosyphilis in 2018.  He is chronically on Namenda which he has been refusing.  4)Thrombocytopenia--patient with some degree of mild pancytopenia which is stable--  Appears to be a chronic issue.  5)Seizure disorder.  Chronically on Keppra and Lamictal.  He has been refusing these medications.  He has IM Ativan as needed ordered if he has any breakthrough seizures.  6)Noncompliance.  Patient has been refusing medications for several weeks to months at this point.    7)HTN--BP is not at goal partly due to poor compliance with medications -  --- Increase clonidine patch once weekly to 0.2 mg from 0.1  try to improve compliance -Continue amlodipine 5 mg daily when patient agrees to take it   DVT prophylaxis: heparin injection 5,000 Units Start: 07/04/20 0600 SCDs  Start: 07/04/20 0130  Code Status: full code Family Communication: left VM for legal guardian Loa Socks  ,  spoke with brother and Niece Disposition Plan: Status is: Inpatient  Remains inpatient appropriate because:Unsafe d/c plan   Dispo: The patient is from: SNF              Anticipated d/c is to: Inpatient psychiatry              Anticipated d/c date is: 1 day              Patient currently is medically stable to d/c. ---- Inpatient psychiatric admission to a geropsychiatric facility continues to be recommended -Patient is medically stable for discharge to inpatient psychiatric/behavioral unit when bed becomes available  Consultants:   Psychiatry  Procedures:     Antimicrobials:      Subjective: -Resting comfortably --Continues to have episodes where he refuses to take his medications and when he refuses to be cooperative with staff  --No violence and No severe agitation  ---Patient is independent with ADLs, ambulatory -Patient is medically stable for discharge to inpatient psychiatric/behavioral unit when bed becomes available  07/16/20--more cooperative, actually joking a bit  Objective: Vitals:   07/14/20 1709 07/14/20 2312 07/15/20 1110 07/15/20 2043  BP: (!) 134/85 119/83 (!) 165/93   Pulse:  100    Resp:  20    Temp:      TempSrc:      SpO2:  99%  99%  Weight:      Height:        Intake/Output Summary (Last 24 hours) at 07/16/2020 1051 Last data filed at 07/16/2020 0900 Gross per 24 hour  Intake 1040 ml  Output --  Net 1040 ml   Filed Weights   07/03/20 1504 07/04/20 0200  Weight: 92.5 kg 97.3 kg    Examination:  General exam: Alert, awake, oriented x 3 Respiratory system: Clear to auscultation. Respiratory effort normal. Cardiovascular system:RRR. No murmurs, rubs, gallops. Neuro--ambulating around in the room, overall steady, no obvious new focal deficits Psych--- psychosis, noncompliance persist with episodes of bizarre behavior like to drinking his own urine and paranoid statements  Data Reviewed:   CBC: Recent Labs  Lab 07/12/20 0438  WBC  2.5*  HGB 12.4*  HCT 40.3  MCV 90.4  PLT 542*   Basic Metabolic Panel: Recent Labs  Lab 07/12/20 0438  NA 140  K 4.1  CL 105  CO2 26  GLUCOSE 86  BUN 16  CREATININE 0.72  CALCIUM 9.7   GFR: Estimated Creatinine Clearance: 92 mL/min (by C-G formula based on SCr of 0.72 mg/dL). Liver Function Tests: Recent Labs  Lab 07/12/20 0438  AST 16  ALT 20  ALKPHOS 58  BILITOT 0.3  PROT 6.9  ALBUMIN 3.5   No results for input(s): LIPASE, AMYLASE in the last 168 hours. No results for input(s): AMMONIA in the last 168 hours. Coagulation Profile: No results for input(s): INR, PROTIME in the last 168 hours. Cardiac Enzymes: No results for input(s): CKTOTAL, CKMB, CKMBINDEX, TROPONINI in the last 168 hours. BNP (last 3 results) No results for input(s): PROBNP in the last 8760 hours. HbA1C: No results for input(s): HGBA1C in the last 72 hours. CBG: No results for input(s): GLUCAP in the last 168 hours. Lipid Profile: No results for input(s): CHOL, HDL, LDLCALC, TRIG, CHOLHDL, LDLDIRECT in the last 72 hours. Thyroid Function Tests: No results for input(s): TSH, T4TOTAL, FREET4, T3FREE, THYROIDAB in the last 72 hours. Anemia  Panel: No results for input(s): VITAMINB12, FOLATE, FERRITIN, TIBC, IRON, RETICCTPCT in the last 72 hours. Sepsis Labs: No results for input(s): PROCALCITON, LATICACIDVEN in the last 168 hours.  No results found for this or any previous visit (from the past 240 hour(s)).  Radiology Studies: No results found.  Scheduled Meds: . amantadine  100 mg Oral BID  . amLODipine  5 mg Oral Daily  . cloNIDine  0.1 mg Transdermal Weekly  . divalproex  1,000 mg Oral Q12H  . FLUoxetine  40 mg Oral Daily  . haloperidol  2 mg Oral TID  . heparin  5,000 Units Subcutaneous Q8H  . lamoTRIgine  50 mg Oral BID  . levETIRAcetam  500 mg Oral BID  . memantine  5 mg Oral QHS   Continuous Infusions:    LOS: 12 days     -Patient is independent with ADLs,  ambulatory  - - Inpatient psychiatric admission to a geropsychiatric facility continues to be recommended  -Patient is medically stable for discharge to inpatient psychiatric/behavioral unit when bed becomes available  Roxan Hockey, MD [facility]   If 7PM-7AM, please contact night-coverage [email or web address]  07/16/2020, 10:51 AM

## 2020-07-17 DIAGNOSIS — F250 Schizoaffective disorder, bipolar type: Secondary | ICD-10-CM | POA: Diagnosis not present

## 2020-07-17 NOTE — Plan of Care (Signed)

## 2020-07-17 NOTE — BH Assessment (Signed)
Re-assessment 07/17/2020:  Attempted to reassess patient. Patient observed laying in the bed, calm. He initially states, "I don't want to talk". After clinician introduced self to patient...Henry Smith reported that he was feeling good today. Then stated, "I don't want to talk, you can talk to my lawyer". Patient went on to say that he has a court date. Patient then laid in the bed with eyes closed an appeared to be falling asleep. The NP assisted and encouraged patient to participate in the reassessment. However, he refused. Per Mordecai Maes, NP, patient continues to meet inpatient treatment. Disposition LCSW will continue to seek placement.

## 2020-07-17 NOTE — Progress Notes (Signed)
PROGRESS NOTE    Henry Smith  ZLD:[id number] DOB: 1941/06/23 DOA: 07/03/2020 PCP: Caprice Renshaw, MD    Brief Narrative:  79 year old male with a history of neurosyphilis, dementia, schizoaffective disorder, COPD, thrombocytopenia, seizure disorder, who is a long-term resident of a nursing facility, was brought to the hospital with progressive changes in mental status.  Patient had been coming increasingly aggressive, was refusing any medications, becoming paranoid.  More recently, staff had noted that he was drinking his urine.  He was evaluated in the emergency room and felt that he may have a urinary tract infection was admitted to the hospital service.  Further review of urine studies indicate that his positive urine culture is likely a colonization and not a true infection.  He did not have any other signs of active infection.  Discussed with infectious disease who agreed with not treating with antibiotics.  It was felt that his progressive decline in mental status was related to his underlying behavioral health.  Seen by psychiatry with recommendations for inpatient psychiatry.  He is involuntarily committed.  Patient is medically stable for discharge whenever he can be placed by psychiatry. -Patient is independent with ADLs, ambulatory  -Patient is medically stable for discharge to inpatient psychiatric/behavioral unit when bed becomes available  Assessment & Plan:   Active Problems:   Schizoaffective disorder, bipolar type (HCC)   Thrombocytopenia (HCC)   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HCC)   Seizure ([place])   Noncompliance with medication regimen   Acute metabolic encephalopathy   1)Progressive behavioral changes---  Initially felt to be related to possible UTI, but patient likely has colonization and not a true urinary tract infection.  He has been refusing his medications for weeks to months.  He is on several psychotropic medications such as Depakote, Haldol, Ativan.  He  also takes Lamictal, Keppra for seizures.  His behavior changes appear to be chronic progressive in nature and appear to be secondary to his underlying mental illness.  Other work-up including CT imaging of head, basic chemistry, ammonia, TSH, ABG were unrevealing.  Appreciate psychiatry input.  Recommendations are for inpatient psychiatry.  He is currently under involuntary commitment.---- - Inpatient psychiatric admission to a geropsychiatric facility continues to be recommended -Patient is medically stable for discharge to inpatient psychiatric/behavioral unit when bed becomes available   --- Poor compliance with medications and persistent psychotic type behavior disturbance persist  2)ESBL E coli  in urine--  Discussed with infectious disease, Dr. Drucilla Schmidt.  With no leukocytosis, fever, dysuria, unlikely to be an active infection.  Felt to be more related to colonization.  Antibiotics were not recommended in this situation.  3)Dementia Associate with Neurosyphilis---  Review of records indicate that he was treated for neurosyphilis in 2018.  He is chronically on Namenda which he has been refusing.  4)Thrombocytopenia--patient with some degree of mild pancytopenia which is stable--  Appears to be a chronic issue.  5)Seizure disorder.  Chronically on Keppra and Lamictal.  He has been refusing these medications.  He has IM Ativan as needed ordered if he has any breakthrough seizures.  6)Noncompliance.  Patient has been refusing medications for several weeks to months at this point.    7)HTN--BP is not at goal partly due to poor compliance with medications -  --- Increase clonidine patch once weekly to 0.2 mg from 0.1  try to improve compliance -Continue amlodipine 5 mg daily when patient agrees to take it   DVT prophylaxis: heparin injection 5,000 Units Start: 07/04/20 0600 SCDs  Start: 07/04/20 0130  Code Status: full code Family Communication: left VM for legal guardian Loa Socks  ,  spoke with brother and Niece Disposition Plan: Status is: Inpatient  Remains inpatient appropriate because:Unsafe d/c plan   Dispo: The patient is from: SNF              Anticipated d/c is to: Inpatient psychiatry              Anticipated d/c date is: 1 day              Patient currently is medically stable to d/c. ---- Inpatient psychiatric admission to a geropsychiatric facility continues to be recommended -Patient is medically stable for discharge to inpatient psychiatric/behavioral unit when bed becomes available  Consultants:   Psychiatry  Procedures:     Antimicrobials:      Subjective: -Resting comfortably --Continues to have episodes where he refuses to take his medications and when he refuses to be cooperative with staff  --No violence and No severe agitation  ---Patient is independent with ADLs, ambulatory -Patient is medically stable for discharge to inpatient psychiatric/behavioral unit when bed becomes available  07/17/20--remains intermittently noncompliant and uncooperative at times  Objective: Vitals:   07/14/20 2312 07/15/20 1110 07/15/20 2043 07/16/20 1630  BP:  (!) 165/93  (!) 144/82  Pulse: 100   85  Resp: 20   16  Temp:    98.6 F (37 C)  TempSrc:    Oral  SpO2: 99%  99% 99%  Weight:      Height:       No intake or output data in the 24 hours ending 07/17/20 1927 Filed Weights   07/03/20 1504 07/04/20 0200  Weight: 92.5 kg 97.3 kg    Examination:  General exam: Alert, awake, oriented x 3 Respiratory system: Clear to auscultation. Respiratory effort normal. Cardiovascular system:RRR. No murmurs, rubs, gallops. Neuro--ambulating around in the room, overall steady, no obvious new focal deficits Psych--- psychosis, noncompliance persist with episodes of bizarre behavior like to drinking his own urine and paranoid statements  Data Reviewed:   CBC: Recent Labs  Lab 07/12/20 0438  WBC 2.5*  HGB 12.4*  HCT 40.3  MCV 90.4  PLT 129*     Basic Metabolic Panel: Recent Labs  Lab 07/12/20 0438  NA 140  K 4.1  CL 105  CO2 26  GLUCOSE 86  BUN 16  CREATININE 0.72  CALCIUM 9.7   GFR: Estimated Creatinine Clearance: 92 mL/min (by C-G formula based on SCr of 0.72 mg/dL). Liver Function Tests: Recent Labs  Lab 07/12/20 0438  AST 16  ALT 20  ALKPHOS 58  BILITOT 0.3  PROT 6.9  ALBUMIN 3.5   No results for input(s): LIPASE, AMYLASE in the last 168 hours. No results for input(s): AMMONIA in the last 168 hours. Coagulation Profile: No results for input(s): INR, PROTIME in the last 168 hours. Cardiac Enzymes: No results for input(s): CKTOTAL, CKMB, CKMBINDEX, TROPONINI in the last 168 hours. BNP (last 3 results) No results for input(s): PROBNP in the last 8760 hours. HbA1C: No results for input(s): HGBA1C in the last 72 hours. CBG: No results for input(s): GLUCAP in the last 168 hours. Lipid Profile: No results for input(s): CHOL, HDL, LDLCALC, TRIG, CHOLHDL, LDLDIRECT in the last 72 hours. Thyroid Function Tests: No results for input(s): TSH, T4TOTAL, FREET4, T3FREE, THYROIDAB in the last 72 hours. Anemia Panel: No results for input(s): VITAMINB12, FOLATE, FERRITIN, TIBC, IRON, RETICCTPCT in the last 72  hours. Sepsis Labs: No results for input(s): PROCALCITON, LATICACIDVEN in the last 168 hours.  No results found for this or any previous visit (from the past 240 hour(s)).  Radiology Studies: No results found.  Scheduled Meds: . amantadine  100 mg Oral BID  . amLODipine  5 mg Oral Daily  . [START ON 07/18/2020] cloNIDine  0.2 mg Transdermal Weekly  . divalproex  1,000 mg Oral Q12H  . FLUoxetine  40 mg Oral Daily  . haloperidol  2 mg Oral TID  . heparin  5,000 Units Subcutaneous Q8H  . lamoTRIgine  50 mg Oral BID  . levETIRAcetam  500 mg Oral BID  . memantine  5 mg Oral QHS   Continuous Infusions:    LOS: 13 days     -Patient is independent with ADLs, ambulatory  - - Inpatient psychiatric  admission to a geropsychiatric facility continues to be recommended  -Patient is medically stable for discharge to inpatient psychiatric/behavioral unit when bed becomes available  Roxan Hockey, MD [facility]   If 7PM-7AM, please contact night-coverage [email or web address]  07/17/2020, 7:27 PM

## 2020-07-18 DIAGNOSIS — F250 Schizoaffective disorder, bipolar type: Secondary | ICD-10-CM | POA: Diagnosis not present

## 2020-07-18 DIAGNOSIS — F0280 Dementia in other diseases classified elsewhere without behavioral disturbance: Secondary | ICD-10-CM | POA: Diagnosis not present

## 2020-07-18 DIAGNOSIS — R569 Unspecified convulsions: Secondary | ICD-10-CM | POA: Diagnosis not present

## 2020-07-18 DIAGNOSIS — A5217 General paresis: Secondary | ICD-10-CM | POA: Diagnosis not present

## 2020-07-18 NOTE — Progress Notes (Signed)
PROGRESS NOTE    Henry Smith  KGY:[id number] DOB: Oct 18, 1941 DOA: 07/03/2020 PCP: Caprice Renshaw, MD    Brief Narrative:  79 year old male with a history of neurosyphilis, dementia, schizoaffective disorder, COPD, thrombocytopenia, seizure disorder, who is a long-term resident of a nursing facility, was brought to the hospital with progressive changes in mental status.  Patient had been coming increasingly aggressive, was refusing any medications, becoming paranoid.  More recently, staff had noted that he was drinking his urine.  He was evaluated in the emergency room and felt that he may have a urinary tract infection was admitted to the hospital service.  Further review of urine studies indicate that his positive urine culture is likely a colonization and not a true infection.  He did not have any other signs of active infection.  Discussed with infectious disease who agreed with not treating with antibiotics.  It was felt that his progressive decline in mental status was related to his underlying behavioral health.  Seen by psychiatry with recommendations for inpatient psychiatry.  He is involuntarily committed.  Patient is medically stable for discharge whenever he can be placed by psychiatry. -Patient is independent with ADLs, ambulatory  -Patient is medically stable for discharge to inpatient psychiatric/behavioral unit when bed becomes available  Assessment & Plan:   Active Problems:   Schizoaffective disorder, bipolar type (HCC)   Thrombocytopenia (HCC)   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HCC)   Seizure ([place])   Noncompliance with medication regimen   Acute metabolic encephalopathy   1)Progressive behavioral changes---  Initially felt to be related to possible UTI, but patient likely has colonization and not a true urinary tract infection.  He has been refusing his medications for weeks to months.  He is on several psychotropic medications such as Depakote, Haldol, Ativan.  He  also takes Lamictal, Keppra for seizures.  His behavior changes appear to be chronic progressive in nature and appear to be secondary to his underlying mental illness.  Other work-up including CT imaging of head, basic chemistry, ammonia, TSH, ABG were unrevealing.  Appreciate psychiatry input.  Recommendations are for inpatient psychiatry.  He is currently under involuntary commitment.---- - Inpatient psychiatric admission to a geropsychiatric facility continues to be recommended -Patient is medically stable for discharge to inpatient psychiatric/behavioral unit when bed becomes available   --- Poor compliance with medications and persistent psychotic type behavior disturbance persist  2)ESBL E coli  in urine--  Discussed with infectious disease, Dr. Drucilla Schmidt.  With no leukocytosis, fever, dysuria, unlikely to be an active infection.  Felt to be more related to colonization.  Antibiotics were not recommended in this situation.  3)Dementia Associate with Neurosyphilis---  Review of records indicate that he was treated for neurosyphilis in 2018.  He is chronically on Namenda which he has been refusing.  4)Thrombocytopenia--patient with some degree of mild pancytopenia which is stable--  Appears to be a chronic issue.  5)Seizure disorder.  Chronically on Keppra and Lamictal.  He has been refusing these medications.  He has IM Ativan as needed ordered if he has any breakthrough seizures.  6)Noncompliance.  Patient has been refusing medications for several weeks to months at this point.    7)HTN--BP is not at goal partly due to poor compliance with medications -  --- Increase clonidine patch once weekly to 0.2 mg from 0.1  try to improve compliance -Continue amlodipine 5 mg daily when patient agrees to take it   DVT prophylaxis: heparin injection 5,000 Units Start: 07/04/20 0600 SCDs  Start: 07/04/20 0130  Code Status: full code Family Communication: left VM for legal guardian Loa Socks  ,  spoke with brother and Niece Disposition Plan: Status is: Inpatient  Remains inpatient appropriate because:Unsafe d/c plan   Dispo: The patient is from: SNF              Anticipated d/c is to: Inpatient psychiatry              Anticipated d/c date is: 1 day              Patient currently is medically stable to d/c. ---- Inpatient psychiatric admission to a geropsychiatric facility continues to be recommended -Patient is medically stable for discharge to inpatient psychiatric/behavioral unit when bed becomes available  Consultants:   Psychiatry  Procedures:     Antimicrobials:      Subjective: -Resting comfortably --Continues to have episodes where he refuses to take his medications and when he refuses to be cooperative with staff  --No violence and No severe agitation  ---Patient is independent with ADLs, ambulatory -Patient is medically stable for discharge to inpatient psychiatric/behavioral unit when bed becomes available  07/18/20--remains intermittently noncompliant and uncooperative at times, took a lot of persuasion for patient to agree to take medications this evening  Objective: Vitals:   07/15/20 2043 07/16/20 1630 07/17/20 2030 07/17/20 2311  BP:  (!) 144/82  (!) 152/78  Pulse:  85  76  Resp:  16    Temp:  98.6 F (37 C)    TempSrc:  Oral    SpO2: 99% 99% 99% 100%  Weight:      Height:       No intake or output data in the 24 hours ending 07/18/20 1920 Filed Weights   07/03/20 1504 07/04/20 0200  Weight: 92.5 kg 97.3 kg    Examination:  General exam: Alert, awake, oriented x 3 Respiratory system: Clear to auscultation. Respiratory effort normal. Cardiovascular system:RRR. No murmurs, rubs, gallops. Neuro--ambulating around in the room, overall steady, no obvious new focal deficits Psych--- psychosis, noncompliance persist with episodes of bizarre behavior like to drinking his own urine and paranoid statements  Data Reviewed:   CBC: Recent  Labs  Lab 07/12/20 0438  WBC 2.5*  HGB 12.4*  HCT 40.3  MCV 90.4  PLT 841*   Basic Metabolic Panel: Recent Labs  Lab 07/12/20 0438  NA 140  K 4.1  CL 105  CO2 26  GLUCOSE 86  BUN 16  CREATININE 0.72  CALCIUM 9.7   GFR: Estimated Creatinine Clearance: 92 mL/min (by C-G formula based on SCr of 0.72 mg/dL). Liver Function Tests: Recent Labs  Lab 07/12/20 0438  AST 16  ALT 20  ALKPHOS 58  BILITOT 0.3  PROT 6.9  ALBUMIN 3.5   No results for input(s): LIPASE, AMYLASE in the last 168 hours. No results for input(s): AMMONIA in the last 168 hours. Coagulation Profile: No results for input(s): INR, PROTIME in the last 168 hours. Cardiac Enzymes: No results for input(s): CKTOTAL, CKMB, CKMBINDEX, TROPONINI in the last 168 hours. BNP (last 3 results) No results for input(s): PROBNP in the last 8760 hours. HbA1C: No results for input(s): HGBA1C in the last 72 hours. CBG: No results for input(s): GLUCAP in the last 168 hours. Lipid Profile: No results for input(s): CHOL, HDL, LDLCALC, TRIG, CHOLHDL, LDLDIRECT in the last 72 hours. Thyroid Function Tests: No results for input(s): TSH, T4TOTAL, FREET4, T3FREE, THYROIDAB in the last 72 hours. Anemia Panel: No  results for input(s): VITAMINB12, FOLATE, FERRITIN, TIBC, IRON, RETICCTPCT in the last 72 hours. Sepsis Labs: No results for input(s): PROCALCITON, LATICACIDVEN in the last 168 hours.  No results found for this or any previous visit (from the past 240 hour(s)).  Radiology Studies: No results found.  Scheduled Meds: . amantadine  100 mg Oral BID  . amLODipine  5 mg Oral Daily  . cloNIDine  0.2 mg Transdermal Weekly  . divalproex  1,000 mg Oral Q12H  . FLUoxetine  40 mg Oral Daily  . haloperidol  2 mg Oral TID  . heparin  5,000 Units Subcutaneous Q8H  . lamoTRIgine  50 mg Oral BID  . levETIRAcetam  500 mg Oral BID  . memantine  5 mg Oral QHS   Continuous Infusions:    LOS: 14 days     -Patient is  independent with ADLs, ambulatory  - - Inpatient psychiatric admission to a geropsychiatric facility continues to be recommended  -Patient is medically stable for discharge to inpatient psychiatric/behavioral unit when bed becomes available  Roxan Hockey, MD [facility]   If 7PM-7AM, please contact night-coverage [email or web address]  07/18/2020, 7:20 PM

## 2020-07-18 NOTE — BH Assessment (Signed)
Behavioral Health:  TTS spoke to [place], patient's nurse who reports that patient is relatively calm for the moment.  She states that he starts having more agitation in the afternoon.  She states that he continues to refuse to take medications.  Patient has been unwilling to talk to TTS.  Based on his continued agitation and not taking his medication, inpatient treatment options will continue to be explored.

## 2020-07-18 NOTE — Progress Notes (Signed)
Patient laying in bed, talking to staff. Patient refusing to take medication stating "I dont take medicine anymore". After several attempts patient still refusing to take medication and beginning to get agitated. MD made aware that patient refused to take medications.

## 2020-07-18 NOTE — Plan of Care (Signed)
  Problem: Activity: Goal: Risk for activity intolerance will decrease Outcome: Adequate for Discharge   Problem: Skin Integrity: Goal: Risk for impaired skin integrity will decrease Outcome: Adequate for Discharge

## 2020-07-18 NOTE — Progress Notes (Signed)
CSW contacted admissions staff at [facility]. Pt remains on their geriatric bed wait list.   Disposition will continue to follow.   Audree Camel, LCSW, [place] Disposition [facility]/TTS [phone]

## 2020-07-19 DIAGNOSIS — Z9114 Patient's other noncompliance with medication regimen: Secondary | ICD-10-CM | POA: Diagnosis not present

## 2020-07-19 DIAGNOSIS — F250 Schizoaffective disorder, bipolar type: Secondary | ICD-10-CM | POA: Diagnosis not present

## 2020-07-19 DIAGNOSIS — A5217 General paresis: Secondary | ICD-10-CM | POA: Diagnosis not present

## 2020-07-19 DIAGNOSIS — R569 Unspecified convulsions: Secondary | ICD-10-CM | POA: Diagnosis not present

## 2020-07-19 NOTE — Progress Notes (Signed)
PROGRESS NOTE    Henry Smith  RXV:[id number] DOB: 10-24-1941 DOA: 07/03/2020 PCP: Caprice Renshaw, MD    Brief Narrative:  79 year old male with a history of neurosyphilis, dementia, schizoaffective disorder, COPD, thrombocytopenia, seizure disorder, who is a long-term resident of a nursing facility, was brought to the hospital with progressive changes in mental status.  Patient had been coming increasingly aggressive, was refusing any medications, becoming paranoid.  More recently, staff had noted that he was drinking his urine.  He was evaluated in the emergency room and felt that he may have a urinary tract infection was admitted to the hospital service.  Further review of urine studies indicate that his positive urine culture is likely a colonization and not a true infection.  He did not have any other signs of active infection.  Discussed with infectious disease who agreed with not treating with antibiotics.  It was felt that his progressive decline in mental status was related to his underlying behavioral health.  Seen by psychiatry with recommendations for inpatient psychiatry.  He is involuntarily committed.  Patient is medically stable for discharge whenever he can be placed by psychiatry. -Patient is independent with ADLs, ambulatory  -Patient is medically stable for discharge to inpatient psychiatric/behavioral unit when bed becomes available  Assessment & Plan:   Active Problems:   Schizoaffective disorder, bipolar type (HCC)   Thrombocytopenia (HCC)   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HCC)   Seizure ([place])   Noncompliance with medication regimen   Acute metabolic encephalopathy   1)Progressive behavioral changes---  Initially felt to be related to possible UTI, but patient likely has colonization and not a true urinary tract infection.  He has been refusing his medications for weeks to months.  He is on several psychotropic medications such as Depakote, Haldol, Ativan.  He  also takes Lamictal, Keppra for seizures.  His behavior changes appear to be chronic progressive in nature and appear to be secondary to his underlying mental illness.  Other work-up including CT imaging of head, basic chemistry, ammonia, TSH, ABG were unrevealing.  Appreciate psychiatry input.  Recommendations are for inpatient psychiatry.  He is currently under involuntary commitment.---- - Inpatient psychiatric admission to a geropsychiatric facility continues to be recommended -Patient is medically stable for discharge to inpatient psychiatric/behavioral unit when bed becomes available   --- Poor compliance with medications and persistent psychotic type behavior disturbance persist  2)ESBL E coli  in urine--  Discussed with infectious disease, Dr. Drucilla Schmidt.  With no leukocytosis, fever, dysuria, unlikely to be an active infection.  Felt to be more related to colonization.  Antibiotics were not recommended in this situation.  3)Dementia Associate with Neurosyphilis---  Review of records indicate that he was treated for neurosyphilis in 2018.  He is chronically on Namenda which he has been refusing.  4)Thrombocytopenia--patient with some degree of mild pancytopenia which is stable--  Appears to be a chronic issue.  5)Seizure disorder.  Chronically on Keppra and Lamictal.  He has been refusing these medications.  He has IM Ativan as needed ordered if he has any breakthrough seizures.  6)Noncompliance.  Patient has been refusing medications for several weeks to months at this point.    7)HTN--BP is not at goal partly due to poor compliance with medications -  --- Increase clonidine patch once weekly to 0.2 mg from 0.1  try to improve compliance -Continue amlodipine 5 mg daily when patient agrees to take it   DVT prophylaxis: heparin injection 5,000 Units Start: 07/04/20 0600 SCDs  Start: 07/04/20 0130  Code Status: full code Family Communication: left VM for legal guardian Loa Socks  ,  spoke with brother and Niece Disposition Plan: Status is: Inpatient  Remains inpatient appropriate because:Unsafe d/c plan   Dispo: The patient is from: SNF              Anticipated d/c is to: Inpatient psychiatry              Anticipated d/c date is: 1 day              Patient currently is medically stable to d/c. ---- Inpatient psychiatric admission to a geropsychiatric facility continues to be recommended -Patient is medically stable for discharge to inpatient psychiatric/behavioral unit when bed becomes available  Consultants:   Psychiatry  Procedures:     Antimicrobials:      Subjective: -Resting comfortably --Continues to have episodes where he refuses to take his medications and when he refuses to be cooperative with staff  --No violence and No severe agitation  ---Patient is independent with ADLs, ambulatory -Patient is medically stable for discharge to inpatient psychiatric/behavioral unit when bed becomes available  07/19/20--remains intermittently noncompliant and uncooperative at times, - Mostly cooperative today  Objective: Vitals:   07/17/20 2311 07/18/20 2007 07/18/20 2109 07/19/20 0935  BP: (!) 152/78  (!) 147/57 (!) 157/87  Pulse: 76  62   Resp:   18   Temp:   98.3 F (36.8 C)   TempSrc:   Oral   SpO2: 100% 99% 100%   Weight:      Height:        Intake/Output Summary (Last 24 hours) at 07/19/2020 1919 Last data filed at 07/19/2020 1100 Gross per 24 hour  Intake 0 ml  Output --  Net 0 ml   Filed Weights   07/03/20 1504 07/04/20 0200  Weight: 92.5 kg 97.3 kg    Examination:  General exam: Alert, awake, oriented x 3 Respiratory system: Clear to auscultation. Respiratory effort normal. Cardiovascular system:RRR. No murmurs, rubs, gallops. Neuro--ambulating around in the room, overall steady, no obvious new focal deficits Psych--- psychosis, noncompliance persist with episodes of bizarre behavior like to drinking his own urine and paranoid  statements  Data Reviewed:   CBC: No results for input(s): WBC, NEUTROABS, HGB, HCT, MCV, PLT in the last 168 hours. Basic Metabolic Panel: No results for input(s): NA, K, CL, CO2, GLUCOSE, BUN, CREATININE, CALCIUM, MG, PHOS in the last 168 hours. GFR: Estimated Creatinine Clearance: 92 mL/min (by C-G formula based on SCr of 0.72 mg/dL). Liver Function Tests: No results for input(s): AST, ALT, ALKPHOS, BILITOT, PROT, ALBUMIN in the last 168 hours. No results for input(s): LIPASE, AMYLASE in the last 168 hours. No results for input(s): AMMONIA in the last 168 hours. Coagulation Profile: No results for input(s): INR, PROTIME in the last 168 hours. Cardiac Enzymes: No results for input(s): CKTOTAL, CKMB, CKMBINDEX, TROPONINI in the last 168 hours. BNP (last 3 results) No results for input(s): PROBNP in the last 8760 hours. HbA1C: No results for input(s): HGBA1C in the last 72 hours. CBG: No results for input(s): GLUCAP in the last 168 hours. Lipid Profile: No results for input(s): CHOL, HDL, LDLCALC, TRIG, CHOLHDL, LDLDIRECT in the last 72 hours. Thyroid Function Tests: No results for input(s): TSH, T4TOTAL, FREET4, T3FREE, THYROIDAB in the last 72 hours. Anemia Panel: No results for input(s): VITAMINB12, FOLATE, FERRITIN, TIBC, IRON, RETICCTPCT in the last 72 hours. Sepsis Labs: No results for input(s): PROCALCITON,  LATICACIDVEN in the last 168 hours.  No results found for this or any previous visit (from the past 240 hour(s)).  Radiology Studies: No results found.  Scheduled Meds: . amantadine  100 mg Oral BID  . amLODipine  5 mg Oral Daily  . cloNIDine  0.2 mg Transdermal Weekly  . divalproex  1,000 mg Oral Q12H  . FLUoxetine  40 mg Oral Daily  . haloperidol  2 mg Oral TID  . heparin  5,000 Units Subcutaneous Q8H  . lamoTRIgine  50 mg Oral BID  . levETIRAcetam  500 mg Oral BID  . memantine  5 mg Oral QHS   Continuous Infusions:    LOS: 15 days    -Patient is  independent with ADLs, ambulatory  -- Inpatient psychiatric admission to a geropsychiatric facility continues to be recommended  -Patient is medically stable for discharge to inpatient psychiatric/behavioral unit when bed becomes available  Roxan Hockey, MD [facility]   If 7PM-7AM, please contact night-coverage [email or web address]  07/19/2020, 7:19 PM

## 2020-07-20 DIAGNOSIS — A5217 General paresis: Secondary | ICD-10-CM | POA: Diagnosis not present

## 2020-07-20 DIAGNOSIS — R569 Unspecified convulsions: Secondary | ICD-10-CM | POA: Diagnosis not present

## 2020-07-20 DIAGNOSIS — Z9114 Patient's other noncompliance with medication regimen: Secondary | ICD-10-CM | POA: Diagnosis not present

## 2020-07-20 DIAGNOSIS — F250 Schizoaffective disorder, bipolar type: Secondary | ICD-10-CM | POA: Diagnosis not present

## 2020-07-20 NOTE — Progress Notes (Signed)
PROGRESS NOTE    Henry Smith  [id number] DOB: 1941/03/24 DOA: 07/03/2020 PCP: Caprice Renshaw, MD    Brief Narrative:  79 year old male with a history of neurosyphilis, dementia, schizoaffective disorder, COPD, thrombocytopenia, seizure disorder, who is a long-term resident of a nursing facility, was brought to the hospital with progressive changes in mental status.  Patient had been coming increasingly aggressive, was refusing any medications, becoming paranoid.  More recently, staff had noted that he was drinking his urine.  He was evaluated in the emergency room and felt that he may have a urinary tract infection was admitted to the hospital service.  Further review of urine studies indicate that his positive urine culture is likely a colonization and not a true infection.  He did not have any other signs of active infection.  Discussed with infectious disease who agreed with not treating with antibiotics.  It was felt that his progressive decline in mental status was related to his underlying behavioral health.  Seen by psychiatry with recommendations for inpatient psychiatry.  He is involuntarily committed.  Patient is medically stable for discharge whenever he can be placed by psychiatry. -Patient is independent with ADLs, ambulatory  -Patient is medically stable for discharge to inpatient psychiatric/behavioral unit when bed becomes available  Assessment & Plan:   Active Problems:   Thrombocytopenia (HCC)   Schizoaffective disorder, bipolar type ([place])   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[place])   Seizure ([place])   Noncompliance with medication regimen   Acute metabolic encephalopathy   1)Progressive behavioral changes---  Initially felt to be related to possible UTI, but patient likely has colonization and not a true urinary tract infection.  He has been refusing his medications for weeks to months.  He is on several psychotropic medications such as Depakote, Haldol, Ativan.  He  also takes Lamictal, Keppra for seizures.  His behavior changes appear to be chronic progressive in nature and appear to be secondary to his underlying mental illness.  Other work-up including CT imaging of head, basic chemistry, ammonia, TSH, ABG were unrevealing.  Appreciate psychiatry input.  Recommendations are for inpatient psychiatry.  He is currently under involuntary commitment.---- - Inpatient psychiatric admission to a geropsychiatric facility continues to be recommended -Patient is medically stable for discharge to inpatient psychiatric/behavioral unit when bed becomes available   --- Poor compliance with medications and persistent psychotic type behavior disturbance persist  2)ESBL E coli  in urine--  Discussed with infectious disease, Dr. Drucilla Schmidt.  With no leukocytosis, fever, dysuria, unlikely to be an active infection.  Felt to be more related to colonization.  Antibiotics were not recommended in this situation.  3)Dementia Associate with Neurosyphilis---  Review of records indicate that he was treated for neurosyphilis in 2018.  He is chronically on Namenda which he has been refusing.  4)Thrombocytopenia--patient with some degree of mild pancytopenia which is stable--  Appears to be a chronic issue.  5)Seizure disorder.  Chronically on Keppra and Lamictal.  He has been refusing these medications.  He has IM Ativan as needed ordered if he has any breakthrough seizures.  6)Noncompliance.  Patient has been refusing medications for several weeks to months at this point.    7)HTN--BP is not at goal partly due to poor compliance with medications -  --- Increase clonidine patch once weekly to 0.2 mg from 0.1  try to improve compliance -Continue amlodipine 5 mg daily when patient agrees to take it   DVT prophylaxis: heparin injection 5,000 Units Start: 07/04/20 0600 SCDs  Start: 07/04/20 0130  Code Status: full code Family Communication: No family present Disposition Plan: Status is:  Inpatient  Remains inpatient appropriate because:Unsafe d/c plan   Dispo: The patient is from: SNF              Anticipated d/c is to: Inpatient psychiatry              Anticipated d/c date is: 1 day              Patient currently is medically stable to d/c. ---- Inpatient psychiatric admission to a geropsychiatric facility continues to be recommended -Patient is medically stable for discharge to inpatient psychiatric/behavioral unit when bed becomes available  Consultants:   Psychiatry  Procedures:     Antimicrobials:      Subjective: Patient resting in bed.  Earlier today he was noted to have a fall.  Denies any head injury.  Reports that he slipped on something on the floor.  Denies any significant injuries.  Able to ambulate without difficulty.  Continues to refuse medications at times.  --No violence and No severe agitation  ---Patient is independent with ADLs, ambulatory -Patient is medically stable for discharge to inpatient psychiatric/behavioral unit when bed becomes available   Objective: Vitals:   07/18/20 2007 07/18/20 2109 07/19/20 0935 07/20/20 0825  BP:  (!) 147/57 (!) 157/87 (!) 155/86  Pulse:  62  82  Resp:  18  18  Temp:  98.3 F (36.8 C)    TempSrc:  Oral    SpO2: 99% 100%  100%  Weight:      Height:        Intake/Output Summary (Last 24 hours) at 07/20/2020 2012 Last data filed at 07/20/2020 1300 Gross per 24 hour  Intake 480 ml  Output --  Net 480 ml   Filed Weights   07/03/20 1504 07/04/20 0200  Weight: 92.5 kg 97.3 kg    Examination:  General exam: Alert, awake, no distress Respiratory system: Clear to auscultation. Respiratory effort normal. Cardiovascular system:RRR. No murmurs, rubs, gallops. Gastrointestinal system: Abdomen is nondistended, soft and nontender. No organomegaly or masses felt. Normal bowel sounds heard. Central nervous system:No focal neurological deficits. Extremities: No C/C/E, +pedal pulses Skin: No rashes,  lesions or ulcers Psychiatry: Lacks insight into his medical issues, calm   Data Reviewed:   CBC: No results for input(s): WBC, NEUTROABS, HGB, HCT, MCV, PLT in the last 168 hours. Basic Metabolic Panel: No results for input(s): NA, K, CL, CO2, GLUCOSE, BUN, CREATININE, CALCIUM, MG, PHOS in the last 168 hours. GFR: Estimated Creatinine Clearance: 92 mL/min (by C-G formula based on SCr of 0.72 mg/dL). Liver Function Tests: No results for input(s): AST, ALT, ALKPHOS, BILITOT, PROT, ALBUMIN in the last 168 hours. No results for input(s): LIPASE, AMYLASE in the last 168 hours. No results for input(s): AMMONIA in the last 168 hours. Coagulation Profile: No results for input(s): INR, PROTIME in the last 168 hours. Cardiac Enzymes: No results for input(s): CKTOTAL, CKMB, CKMBINDEX, TROPONINI in the last 168 hours. BNP (last 3 results) No results for input(s): PROBNP in the last 8760 hours. HbA1C: No results for input(s): HGBA1C in the last 72 hours. CBG: No results for input(s): GLUCAP in the last 168 hours. Lipid Profile: No results for input(s): CHOL, HDL, LDLCALC, TRIG, CHOLHDL, LDLDIRECT in the last 72 hours. Thyroid Function Tests: No results for input(s): TSH, T4TOTAL, FREET4, T3FREE, THYROIDAB in the last 72 hours. Anemia Panel: No results for input(s): VITAMINB12, FOLATE, FERRITIN,  TIBC, IRON, RETICCTPCT in the last 72 hours. Sepsis Labs: No results for input(s): PROCALCITON, LATICACIDVEN in the last 168 hours.  No results found for this or any previous visit (from the past 240 hour(s)).  Radiology Studies: No results found.  Scheduled Meds: . amantadine  100 mg Oral BID  . amLODipine  5 mg Oral Daily  . cloNIDine  0.2 mg Transdermal Weekly  . divalproex  1,000 mg Oral Q12H  . FLUoxetine  40 mg Oral Daily  . haloperidol  2 mg Oral TID  . heparin  5,000 Units Subcutaneous Q8H  . lamoTRIgine  50 mg Oral BID  . levETIRAcetam  500 mg Oral BID  . memantine  5 mg Oral QHS     Continuous Infusions:    LOS: 16 days    -Patient is independent with ADLs, ambulatory  -- Inpatient psychiatric admission to a geropsychiatric facility continues to be recommended  -Patient is medically stable for discharge to inpatient psychiatric/behavioral unit when bed becomes available  Kathie Dike, MD [facility]   If 7PM-7AM, please contact night-coverage [email or web address]  07/20/2020, 8:12 PM

## 2020-07-20 NOTE — Progress Notes (Signed)
Patient got up from bed walking toward door. Safety sitter attempted to get patient back to bed patient started to fall and the safety sitter attempted to assist patient to the floor. This nurse walked into patients room after leaving another patients room when the event occurred. This nurses obtained vital signs and asked if the patient was hurt. Patient stated my elbow this nurses assessed patients elbow the abrasions is what the patient complained about. This nurse asked the patient if he hit his head he stated no. Nurse, Air cabin crew, nurse tech and another nurse assisted patient to chair. Chair alarm placed. Prior to fall patient had nonskid socks and slippers on, bed in lowest position, safety sitter outside room, and call bell in reach. Post fall huddle completed. MD made aware. This nurse called patients legal guardian and left a message no call back from legal guardian. MD placed no new orders. Will continue to monitor throughout shift.

## 2020-07-20 NOTE — Progress Notes (Signed)
Reassessment: Patient seen via telepsych. Chart reviewed. Henry Smith is a 79 year old male with a history of neurosyphilis, dementia, schizoaffective disorder, COPD, thrombocytopenia, seizure disorder, who is a long-term resident of a nursing facility,Patient presented to [facility] following changes in mental status andaggressive behaviorso a psychiatric consult was placed. Since being in the hospital, patient has had periods of agitation, periods where he refuses to take his medications, becoming paranoid and more recently, staff had noted that he was drinking his urine.   On assessment today, patient is lying in bed quietly. He closes his eyes and does not respond when spoken to by this Probation officer. Per prior notes and nursing staff, he speaks with nursing staff but refuses to speak with TTS staff over the monitor.   His nurse reports that he has been doing well today and medication compliant. She states he has been calm and cooperative with no agitated or aggressive behaviors. He slept well overnight, with no problems reported by night shift. He does not appear to be paranoid or to be responding to internal stimuli. He has been eating well. No self-injurious or aggressive behaviors.   I attempted to call patient's legal guardian [name] [phone]) for more information on patient's baseline mental status, with HIPPA-compliant voicemail left. Attempted to call [facility] ([phone]) and left message for DON.   Disposition: Patient meets inpatient criteria but has been declined by multiple facilities and currently on [facility] wait list. He appears to respond well to current psychotropic medications when he is compliant. Continue to encourage medication compliance. Will need to clarify if he is able to return to [facility] or needs alternate long-term treatment if he continues to do well and is not accepted to [facility].

## 2020-07-21 DIAGNOSIS — R569 Unspecified convulsions: Secondary | ICD-10-CM | POA: Diagnosis not present

## 2020-07-21 DIAGNOSIS — F250 Schizoaffective disorder, bipolar type: Secondary | ICD-10-CM | POA: Diagnosis not present

## 2020-07-21 DIAGNOSIS — A5217 General paresis: Secondary | ICD-10-CM | POA: Diagnosis not present

## 2020-07-21 DIAGNOSIS — Z9114 Patient's other noncompliance with medication regimen: Secondary | ICD-10-CM | POA: Diagnosis not present

## 2020-07-21 MED ORDER — HALOPERIDOL LACTATE 5 MG/ML IJ SOLN
5.0000 mg | Freq: Four times a day (QID) | INTRAMUSCULAR | Status: DC | PRN
  Administered 2020-07-21 – 2020-09-06 (×16): 5 mg via INTRAMUSCULAR
  Filled 2020-07-21 (×19): qty 1

## 2020-07-21 NOTE — Progress Notes (Signed)
[facility] night shift.  The patient was aggressive, threatening physical violence and being verbally abusive with the staff. He refused oral medications. Please see notes from the nursing staff for further detail.  Around 20-25 the patient received IM Ativan and 2 mg of Haldol without significant results.  I ordered another 5 mg of haloperidol IM and the patient has a sitter with him at all times to decrease the chance of injury.  Tennis Must, MD.

## 2020-07-21 NOTE — Progress Notes (Signed)
Patient sitting on the edge of the bed, educated that he needs to move toward the middle of the bed so he wont fall and patient refused to move. Patient stated "its okay, either you or the floor will catch me I don't need to move". Patient refuses safety alarms  .

## 2020-07-21 NOTE — B[facility] Counselor (Signed)
Pt is a 79 year old male who remains in the ED under IVC due to aggression and altered mental status.  Pt was reassessed this AM.  He stared and would not speak to Chief Strategy Officer.  Instead, he nodded when asked how he felt, and he shook his head when asked about suicidal ideation/homicidal ideation.  When asked what he wanted to do today, Pt pointed to the door.  He shook his head when asked if he wanted to return to his SNF.  Pt was calm but also non-communicative.  Recommend continued inpatient.

## 2020-07-21 NOTE — Progress Notes (Signed)
PROGRESS NOTE    Henry Smith  HQP:[id number] DOB: May 17, 1941 DOA: 07/03/2020 PCP: Caprice Renshaw, MD    Brief Narrative:  79 year old male with a history of neurosyphilis, dementia, schizoaffective disorder, COPD, thrombocytopenia, seizure disorder, who is a long-term resident of a nursing facility, was brought to the hospital with progressive changes in mental status.  Patient had been coming increasingly aggressive, was refusing any medications, becoming paranoid.  More recently, staff had noted that he was drinking his urine.  He was evaluated in the emergency room and felt that he may have a urinary tract infection was admitted to the hospital service.  Further review of urine studies indicate that his positive urine culture is likely a colonization and not a true infection.  He did not have any other signs of active infection.  Discussed with infectious disease who agreed with not treating with antibiotics.  It was felt that his progressive decline in mental status was related to his underlying behavioral health.  Seen by psychiatry with recommendations for inpatient psychiatry.  He is involuntarily committed.  Patient is medically stable for discharge whenever he can be placed by psychiatry. 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  He has been refusing his medications for weeks to months.  He is on several psychotropic medications such as Depakote, Haldol, Ativan.  He  also takes Lamictal, Keppra for seizures.  His behavior changes appear to be chronic progressive in nature and appear to be secondary to his underlying mental illness.  Other work-up including CT imaging of head, basic chemistry, ammonia, TSH, ABG were unrevealing.  Appreciate psychiatry input.  Recommendations are for inpatient psychiatry.  He is currently under involuntary commitment.---- - Inpatient psychiatric admission to a geropsychiatric facility continues to be recommended -Patient is medically stable for discharge to inpatient psychiatric/behavioral unit when bed becomes available   --- Poor compliance with medications and persistent psychotic type behavior disturbance persist  2)ESBL E coli  in urine--  Discussed with infectious disease, Dr. Drucilla Schmidt.  With no leukocytosis, fever, dysuria, unlikely to be an active infection.  Felt to be more related to colonization.  Antibiotics were not recommended in this situation.  3)Dementia Associate with Neurosyphilis---  Review of records indicate that he was treated for neurosyphilis in 2018.  He is chronically on Namenda which he has been refusing.  4)Thrombocytopenia--patient with some degree of mild pancytopenia which is stable--  Appears to be a chronic issue.  5)Seizure disorder.  Chronically on Keppra and Lamictal.  He has been refusing these medications.  He has IM Ativan as needed ordered if he has any breakthrough seizures.  6)Noncompliance.  Patient has been refusing medications for several weeks to months at this point.    7)HTN--BP is not at goal partly due to poor compliance with medications -  --- Increase clonidine patch once weekly to 0.2 mg from 0.1  try to improve compliance -Continue amlodipine 5 mg daily when patient agrees to take it   DVT prophylaxis: heparin injection 5,000 Units Start: 07/04/20 0600 SCDs  Start: 07/04/20 0130  Code Status: full code Family Communication: No family present Disposition Plan: Status is:  Inpatient  Remains inpatient appropriate because:Unsafe d/c plan   Dispo: The patient is from: SNF              Anticipated d/c is to: Inpatient psychiatry              Anticipated d/c date is: 1 day              Patient currently is medically stable to d/c. ---- Inpatient psychiatric admission to a geropsychiatric facility continues to be recommended -Patient is medically stable for discharge to inpatient psychiatric/behavioral unit when bed becomes available  Consultants:   Psychiatry  Procedures:     Antimicrobials:      Subjective: Denies any shortness of breath or significant pain.  Continues to refuse to take medications.  Wants to know when he will be able to leave the hospital  --No violence and No severe agitation  ---Patient is independent with ADLs, ambulatory -Patient is medically stable for discharge to inpatient psychiatric/behavioral unit when bed becomes available   Objective: Vitals:   07/18/20 2109 07/19/20 0935 07/20/20 0825 07/21/20 1533  BP: (!) 147/57 (!) 157/87 (!) 155/86 (!) 148/91  Pulse: 62  82 92  Resp: 18  18 17   Temp: 98.3 F (36.8 C)   98.4 F (36.9 C)  TempSrc: Oral     SpO2: 100%  100% 94%  Weight:      Height:        Intake/Output Summary (Last 24 hours) at 07/21/2020 1953 Last data filed at 07/21/2020 1206 Gross per 24 hour  Intake 1320 ml  Output --  Net 1320 ml   Filed Weights   07/03/20 1504 07/04/20 0200  Weight: 92.5 kg 97.3 kg    Examination:  General exam: Alert, awake, oriented x 3 Respiratory system: Clear to auscultation. Respiratory effort normal. Cardiovascular system:RRR. No murmurs, rubs, gallops. Gastrointestinal system: Abdomen is nondistended, soft and nontender. No organomegaly or masses felt. Normal bowel sounds heard. Central nervous system: Alert and oriented. No focal neurological deficits. Extremities: No C/C/E, +pedal pulses Skin: No rashes, lesions or ulcers Psychiatry: Calm, refusing  medication, lacks insight into his medical issues  Data Reviewed:   CBC: No results for input(s): WBC, NEUTROABS, HGB, HCT, MCV, PLT in the last 168 hours. Basic Metabolic Panel: No results for input(s): NA, K, CL, CO2, GLUCOSE, BUN, CREATININE, CALCIUM, MG, PHOS in the last 168 hours. GFR: Estimated Creatinine Clearance: 92 mL/min (by C-G formula based on SCr of 0.72 mg/dL). Liver Function Tests: No results for input(s): AST, ALT, ALKPHOS, BILITOT, PROT, ALBUMIN in the last 168 hours. No results for input(s): LIPASE, AMYLASE in the last 168 hours. No results for input(s): AMMONIA in the last 168 hours. Coagulation Profile: No results for input(s): INR, PROTIME in the last 168 hours. Cardiac Enzymes: No results for input(s): CKTOTAL, CKMB, CKMBINDEX, TROPONINI in the last 168 hours. BNP (last 3 results) No results for input(s): PROBNP in the last 8760 hours. HbA1C: No results for input(s): HGBA1C in the last 72 hours. CBG: No results for input(s): GLUCAP in the last 168 hours. Lipid Profile: No results for input(s): CHOL, HDL, LDLCALC, TRIG, CHOLHDL, LDLDIRECT in the last 72 hours. Thyroid Function Tests: No results for input(s): TSH, T4TOTAL, FREET4, T3FREE, THYROIDAB in the last 72 hours. Anemia Panel: No results for input(s): VITAMINB12, FOLATE, FERRITIN, TIBC, IRON, RETICCTPCT in the last 72 hours. Sepsis  Labs: No results for input(s): PROCALCITON, LATICACIDVEN in the last 168 hours.  No results found for this or any previous visit (from the past 240 hour(s)).  Radiology Studies: No results found.  Scheduled Meds: . amantadine  100 mg Oral BID  . amLODipine  5 mg Oral Daily  . cloNIDine  0.2 mg Transdermal Weekly  . divalproex  1,000 mg Oral Q12H  . FLUoxetine  40 mg Oral Daily  . haloperidol  2 mg Oral TID  . heparin  5,000 Units Subcutaneous Q8H  . lamoTRIgine  50 mg Oral BID  . levETIRAcetam  500 mg Oral BID  . memantine  5 mg Oral QHS   Continuous  Infusions:    LOS: 17 days    -Patient is independent with ADLs, ambulatory  -- Inpatient psychiatric admission to a geropsychiatric facility continues to be recommended  -Patient is medically stable for discharge to inpatient psychiatric/behavioral unit when bed becomes available  Kathie Dike, MD [facility]   If 7PM-7AM, please contact night-coverage [email or web address]  07/21/2020, 7:53 PM

## 2020-07-22 DIAGNOSIS — Z9114 Patient's other noncompliance with medication regimen: Secondary | ICD-10-CM | POA: Diagnosis not present

## 2020-07-22 DIAGNOSIS — A5217 General paresis: Secondary | ICD-10-CM | POA: Diagnosis not present

## 2020-07-22 DIAGNOSIS — F0280 Dementia in other diseases classified elsewhere without behavioral disturbance: Secondary | ICD-10-CM | POA: Diagnosis not present

## 2020-07-22 NOTE — Progress Notes (Signed)
PROGRESS NOTE    Henry Smith  LXB:[id number] DOB: [birth year] DOA: 07/03/2020 PCP: Caprice Renshaw, MD    Brief Narrative:  79 year old male with a history of neurosyphilis, dementia, schizoaffective disorder, COPD, thrombocytopenia, seizure disorder, who is a long-term resident of a nursing facility, was brought to the hospital with progressive changes in mental status.  Patient had been coming increasingly aggressive, was refusing any medications, becoming paranoid.  More recently, staff had noted that he was drinking his urine.  He was evaluated in the emergency room and felt that he may have a urinary tract infection was admitted to the hospital service.  Further review of urine studies indicate that his positive urine culture is likely a colonization and not a true infection.  He did not have any other signs of active infection.  Discussed with infectious disease who agreed with not treating with antibiotics.  It was felt that his progressive decline in mental status was related to his underlying behavioral health.  Seen by psychiatry with recommendations for inpatient psychiatry.  He is involuntarily committed.  Patient is medically stable for discharge whenever he can be placed by psychiatry. -Patient is independent with ADLs, ambulatory  -Patient is medically stable for discharge to inpatient psychiatric/behavioral unit when bed becomes available  Assessment & Plan:   Active Problems:   Schizoaffective disorder, bipolar type (HCC)   Thrombocytopenia (HCC)   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HCC)   Seizure ([place])   Noncompliance with medication regimen   Acute metabolic encephalopathy   1)Progressive behavioral changes---  Initially felt to be related to possible UTI, but patient likely has colonization and not a true urinary tract infection.  He has been refusing his medications for weeks to months.  He is on several psychotropic medications such as Depakote, Haldol, Ativan.  He  also takes Lamictal, Keppra for seizures.  His behavior changes appear to be chronic progressive in nature and appear to be secondary to his underlying mental illness.  Other work-up including CT imaging of head, basic chemistry, ammonia, TSH, ABG were unrevealing.  Appreciate psychiatry input.  Recommendations are for inpatient psychiatry.  He is currently under involuntary commitment.---- - Inpatient psychiatric admission to a geropsychiatric facility continues to be recommended -Patient is medically stable for discharge to inpatient psychiatric/behavioral unit when bed becomes available   --- Poor compliance with medications and persistent psychotic type behavior disturbance persist  2)ESBL E coli  in urine--  Discussed with infectious disease, Dr. Drucilla Schmidt.  With no leukocytosis, fever, dysuria, unlikely to be an active infection.  Felt to be more related to colonization.  Antibiotics were not recommended in this situation.  3)Dementia Associate with Neurosyphilis---  Review of records indicate that he was treated for neurosyphilis in 2018.  He is chronically on Namenda which he has been refusing.  4)Thrombocytopenia--patient with some degree of mild pancytopenia which is stable--  Appears to be a chronic issue.  5)Seizure disorder.  Chronically on Keppra and Lamictal.  He has been refusing these medications.  He has IM Ativan as needed ordered if he has any breakthrough seizures.  6)Noncompliance.  Patient has been refusing medications for several weeks to months at this point.    7)HTN--BP is not at goal partly due to poor compliance with medications -  --- c/n  Clonidine 0.2mg    patch once weekly to try to improve compliance -Continue amlodipine 5 mg daily when patient agrees to take it   DVT prophylaxis: heparin injection 5,000 Units Start: 07/04/20 0600 SCDs Start:  07/04/20 0130  Code Status: full code Family Communication: No family present Disposition Plan: Status is:  Inpatient  Remains inpatient appropriate because:Unsafe d/c plan   Dispo: The patient is from: SNF              Anticipated d/c is to: Inpatient psychiatry              Anticipated d/c date is: 1 day              Patient currently is medically stable to d/c. ---- Inpatient psychiatric admission to a geropsychiatric facility continues to be recommended -Patient is medically stable for discharge to inpatient psychiatric/behavioral unit when bed becomes available  Consultants:   Psychiatry  Procedures:     Antimicrobials:      Subjective:  -Confusion, agitation and refusal to take medications persist  ---Patient is independent with ADLs, ambulatory -Patient is medically stable for discharge to inpatient psychiatric/behavioral unit when bed becomes available   Objective: Vitals:   07/19/20 0935 07/20/20 0825 07/21/20 1533 07/22/20 0515  BP: (!) 157/87 (!) 155/86 (!) 148/91 (!) 150/94  Pulse:  82 92 100  Resp:  18 17 20   Temp:   98.4 F (36.9 C) 97.7 F (36.5 C)  TempSrc:      SpO2:  100% 94% 99%  Weight:      Height:        Intake/Output Summary (Last 24 hours) at 07/22/2020 1744 Last data filed at 07/22/2020 1300 Gross per 24 hour  Intake 600 ml  Output --  Net 600 ml   Filed Weights   07/03/20 1504 07/04/20 0200  Weight: 92.5 kg 97.3 kg    Examination:  General exam: Alert, awake, oriented x 3 Respiratory system: Clear to auscultation. Respiratory effort normal. Cardiovascular system:RRR. No murmurs, rubs, gallops. Gastrointestinal system: Abdomen is nondistended, soft and nontender. No organomegaly or masses felt. Normal bowel sounds heard. Central nervous system: Alert and oriented. No focal neurological deficits. Extremities: No C/C/E, +pedal pulses Skin: No rashes, lesions or ulcers Psychiatry: Calm, refusing medication, lacks insight into his medical issues  Data Reviewed:   CBC: No results for input(s): WBC, NEUTROABS, HGB, HCT, MCV, PLT in  the last 168 hours. Basic Metabolic Panel: No results for input(s): NA, K, CL, CO2, GLUCOSE, BUN, CREATININE, CALCIUM, MG, PHOS in the last 168 hours. GFR: Estimated Creatinine Clearance: 92 mL/min (by C-G formula based on SCr of 0.72 mg/dL). Liver Function Tests: No results for input(s): AST, ALT, ALKPHOS, BILITOT, PROT, ALBUMIN in the last 168 hours. No results for input(s): LIPASE, AMYLASE in the last 168 hours. No results for input(s): AMMONIA in the last 168 hours. Coagulation Profile: No results for input(s): INR, PROTIME in the last 168 hours. Cardiac Enzymes: No results for input(s): CKTOTAL, CKMB, CKMBINDEX, TROPONINI in the last 168 hours. BNP (last 3 results) No results for input(s): PROBNP in the last 8760 hours. HbA1C: No results for input(s): HGBA1C in the last 72 hours. CBG: No results for input(s): GLUCAP in the last 168 hours. Lipid Profile: No results for input(s): CHOL, HDL, LDLCALC, TRIG, CHOLHDL, LDLDIRECT in the last 72 hours. Thyroid Function Tests: No results for input(s): TSH, T4TOTAL, FREET4, T3FREE, THYROIDAB in the last 72 hours. Anemia Panel: No results for input(s): VITAMINB12, FOLATE, FERRITIN, TIBC, IRON, RETICCTPCT in the last 72 hours. Sepsis Labs: No results for input(s): PROCALCITON, LATICACIDVEN in the last 168 hours.  No results found for this or any previous visit (from the past 240 hour(s)).  Radiology Studies: No results found.  Scheduled Meds: . amantadine  100 mg Oral BID  . amLODipine  5 mg Oral Daily  . cloNIDine  0.2 mg Transdermal Weekly  . divalproex  1,000 mg Oral Q12H  . FLUoxetine  40 mg Oral Daily  . haloperidol  2 mg Oral TID  . heparin  5,000 Units Subcutaneous Q8H  . lamoTRIgine  50 mg Oral BID  . levETIRAcetam  500 mg Oral BID  . memantine  5 mg Oral QHS   Continuous Infusions:    LOS: 18 days    -Patient is independent with ADLs, ambulatory  -- Inpatient psychiatric admission to a geropsychiatric facility  continues to be recommended  -Patient is medically stable for discharge to inpatient psychiatric/behavioral unit when bed becomes available  Roxan Hockey, MD [facility]   If 7PM-7AM, please contact night-coverage [email or web address]  07/22/2020, 5:44 PM

## 2020-07-22 NOTE — Care Management (Signed)
Writer contacted [facility] and was informed that the patient was not placed on the priority list but continues to be on the wait list.

## 2020-07-22 NOTE — Progress Notes (Signed)
Patient continues to be aggressive with staff, verbally abusive to staff and threatening to hit, kick and kill staff.  Patient continues to refuse PO medications.  Patient refused to allow staff to check his vital signs. Refuses to allow safety alarms to be used. Patient given IM Ativan and Haldol to help calm him during the shift.

## 2020-07-22 NOTE — Progress Notes (Addendum)
CSW contacted admissions at [facility]. Pt remains on their wait list.    Disposition will continue to follow.     Audree Camel, LCSW, [place] Disposition CSW [facility] [phone]    UPDATE: 10:46am Recent medical notes documenting increasingly aggressive behaviors have been faxed to [facility] in an attempt to have pt moved to the priority waiting list.

## 2020-07-23 DIAGNOSIS — F0280 Dementia in other diseases classified elsewhere without behavioral disturbance: Secondary | ICD-10-CM | POA: Diagnosis not present

## 2020-07-23 DIAGNOSIS — F250 Schizoaffective disorder, bipolar type: Secondary | ICD-10-CM | POA: Diagnosis not present

## 2020-07-23 DIAGNOSIS — G9341 Metabolic encephalopathy: Secondary | ICD-10-CM | POA: Diagnosis not present

## 2020-07-23 DIAGNOSIS — A5217 General paresis: Secondary | ICD-10-CM | POA: Diagnosis not present

## 2020-07-23 LAB — CBC
HCT: 39.2 % (ref 39.0–52.0)
Hemoglobin: 11.8 g/dL — ABNORMAL LOW (ref 13.0–17.0)
MCH: 27.4 pg (ref 26.0–34.0)
MCHC: 30.1 g/dL (ref 30.0–36.0)
MCV: 91.2 fL (ref 80.0–100.0)
Platelets: 104 10*3/uL — ABNORMAL LOW (ref 150–400)
RBC: 4.3 MIL/uL (ref 4.22–5.81)
RDW: 14.7 % (ref 11.5–15.5)
WBC: 2.3 10*3/uL — ABNORMAL LOW (ref 4.0–10.5)
nRBC: 0 % (ref 0.0–0.2)

## 2020-07-23 LAB — BASIC METABOLIC PANEL
Anion gap: 7 (ref 5–15)
BUN: 18 mg/dL (ref 8–23)
CO2: 27 mmol/L (ref 22–32)
Calcium: 9.6 mg/dL (ref 8.9–10.3)
Chloride: 109 mmol/L (ref 98–111)
Creatinine, Ser: 0.73 mg/dL (ref 0.61–1.24)
GFR calc Af Amer: >60 mL/min (ref >60)
GFR calc non Af Amer: >60 mL/min (ref >60)
Glucose, Bld: 90 mg/dL (ref 70–99)
Potassium: 3.9 mmol/L (ref 3.5–5.1)
Sodium: 143 mmol/L (ref 135–145)

## 2020-07-23 NOTE — BH Assessment (Addendum)
Clinician made contact w/ pt to determine if he continues to meet inpatient criteria. Clinician greeted pt, who was lying in his bed on his right side facing the camera. Pt's eyes were open but he did not respond. Clinician inquired about pt's sleep and he shrugged his shoulders to each question, including, but not limited to, whether he has been sleeping well, what time he fell asleep, or what time he woke up. Clinician inquired as to whether pt had been eating well and pt again shrugged his shoulders, and when clinician inquired as to what pt ate for breakfast, he again shrugged his shoulders. Clinician inquired as to whether pt had been experiencing SI, HI, AVH, or self-harm, to which pt shook his head "no." Clinician inquired as to what pt had been doing with his time in the hospital and pt closed his eyes in response; clinician inquired as to whether that meant he had been sleeping and pt nodded "yes." Clinician inquired as to whether pt had been watching tv and he again shook his head 'yes." Clinician inquired as to whether had any needs and he shook his head "no," and clinician encouraged pt to let staff know if he was in need of anything.  Priscille Loveless, NP, reviewed pt's chart and information and determined he continues to meet inpatient criteria. This information was provided to pt's nurses and providers via internal messenger at 1057.

## 2020-07-23 NOTE — Plan of Care (Signed)
  Problem: Education: Goal: Knowledge of General Education information will improve Description: Including pain rating scale, medication(s)/side effects and non-pharmacologic comfort measures Outcome: Not Progressing   Problem: Health Behavior/Discharge Planning: Goal: Ability to manage health-related needs will improve Outcome: Not Progressing   Problem: Safety: Goal: Ability to remain free from injury will improve Outcome: Not Progressing

## 2020-07-23 NOTE — Progress Notes (Signed)
PROGRESS NOTE    Henry Smith  DZH:[id number] DOB: Jan 24, 1941 DOA: 07/03/2020 PCP: Caprice Renshaw, MD    Brief Narrative:  79 year old male with a history of neurosyphilis, dementia, schizoaffective disorder, COPD, thrombocytopenia, seizure disorder, who is a long-term resident of a nursing facility, was brought to the hospital with progressive changes in mental status.  Patient had been coming increasingly aggressive, was refusing any medications, becoming paranoid.  More recently, staff had noted that he was drinking his urine.  He was evaluated in the emergency room and felt that he may have a urinary tract infection was admitted to the hospital service.  Further review of urine studies indicate that his positive urine culture is likely a colonization and not a true infection.  He did not have any other signs of active infection.  Discussed with infectious disease who agreed with not treating with antibiotics.  It was felt that his progressive decline in mental status was related to his underlying behavioral health.  Seen by psychiatry with recommendations for inpatient psychiatry.  He is involuntarily committed.  Patient is medically stable for discharge whenever he can be placed by psychiatry. -Patient is independent with ADLs, ambulatory  -Patient is medically stable for discharge to inpatient psychiatric/behavioral unit when bed becomes available  Assessment & Plan:   Active Problems:   Schizoaffective disorder, bipolar type (HCC)   Thrombocytopenia (HCC)   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HCC)   Seizure ([facility])   Noncompliance with medication regimen   Acute metabolic encephalopathy   1)Progressive behavioral changes---  Initially felt to be related to possible UTI, but patient likely has colonization and not a true urinary tract infection.  He has been refusing his medications for weeks to months.  He is on several psychotropic medications such as Depakote, Haldol, Ativan.  He  also takes Lamictal, Keppra for seizures.  His behavior changes appear to be chronic progressive in nature and appear to be secondary to his underlying mental illness.  Other work-up including CT imaging of head, basic chemistry, ammonia, TSH, ABG were unrevealing.  Appreciate psychiatry input.  Recommendations are for inpatient psychiatry.  He is currently under involuntary commitment.---- - Inpatient psychiatric admission to a geropsychiatric facility continues to be recommended -Patient is medically stable for discharge to inpatient psychiatric/behavioral unit when bed becomes available  --- Poor compliance with medications and persistent psychotic type behavior disturbance persist  2)ESBL E coli  in urine--  Discussed with infectious disease, Dr. Drucilla Schmidt.  With no leukocytosis, fever, dysuria, unlikely to be an active infection.  Felt to be more related to colonization.  Antibiotics were not recommended in this situation.  3)Dementia Associate with Neurosyphilis---  Review of records indicate that he was treated for neurosyphilis in 2018.  He is chronically on Namenda which he has been refusing.  4)Thrombocytopenia--patient with some degree of mild pancytopenia which is stable--  Appears to be a chronic issue.  5)Seizure disorder.  Chronically on Keppra and Lamictal.  He has been refusing these medications.  He has IM Ativan as needed ordered if he has any breakthrough seizures.  6)Noncompliance.  Patient has been refusing medications for several weeks to months at this point.    7)HTN--BP is not at goal partly due to poor compliance with medications -  --- c/n  Clonidine 0.2mg    patch once weekly to try to improve compliance -Continue amlodipine 5 mg daily when patient agrees to take it   DVT prophylaxis: heparin injection 5,000 Units Start: 07/04/20 0600 SCDs Start: 07/04/20  0130  Code Status: full code Family Communication: No family present Disposition Plan: Status is:  Inpatient  Remains inpatient appropriate because:Unsafe d/c plan   Dispo: The patient is from: SNF              Anticipated d/c is to: Inpatient psychiatry              Anticipated d/c date is: 1 day              Patient currently is medically stable to d/c. ---- Inpatient psychiatric admission to a geropsychiatric facility continues to be recommended -Patient is medically stable for discharge to inpatient psychiatric/behavioral unit when bed becomes available  Consultants:   Psychiatry  Procedures:     Antimicrobials:      Subjective:  -Confusion, agitation and refusal to take medications persist  ---Patient is independent with ADLs, ambulatory -Patient is medically stable for discharge to inpatient psychiatric/behavioral unit when bed becomes available  07/23/20--resting comfortably, ate well  Objective: Vitals:   07/18/20 2109 07/20/20 0825 07/21/20 1533 07/22/20 0515  BP:  (!) 155/86 (!) 148/91 (!) 150/94  Pulse:  82 92 100  Resp: 18 18 17 20   Temp:   98.4 F (36.9 C) 97.7 F (36.5 C)  TempSrc: Oral     SpO2: 100% 100% 94% 99%  Weight:      Height:        Intake/Output Summary (Last 24 hours) at 07/23/2020 1654 Last data filed at 07/23/2020 0826 Gross per 24 hour  Intake 840 ml  Output --  Net 840 ml   Filed Weights   07/03/20 1504 07/04/20 0200  Weight: 92.5 kg 97.3 kg    Examination:  General exam: Alert, awake, oriented x 3 Respiratory system: Clear to auscultation. Respiratory effort normal. Cardiovascular system:RRR. No murmurs, rubs, gallops. Gastrointestinal system: Abdomen is nondistended, soft and nontender. No organomegaly or masses felt. Normal bowel sounds heard. Central nervous system: Alert and oriented. No focal neurological deficits. Extremities: No C/C/E, +pedal pulses Skin: No rashes, lesions or ulcers Psychiatry: Calm, refusing medication, lacks insight into his medical issues  Data Reviewed:   CBC: Recent Labs  Lab  07/23/20 0503  WBC 2.3*  HGB 11.8*  HCT 39.2  MCV 91.2  PLT 673*   Basic Metabolic Panel: Recent Labs  Lab 07/23/20 0503  NA 143  K 3.9  CL 109  CO2 27  GLUCOSE 90  BUN 18  CREATININE 0.73  CALCIUM 9.6   GFR: Estimated Creatinine Clearance: 92 mL/min (by C-G formula based on SCr of 0.73 mg/dL). Liver Function Tests: No results for input(s): AST, ALT, ALKPHOS, BILITOT, PROT, ALBUMIN in the last 168 hours. No results for input(s): LIPASE, AMYLASE in the last 168 hours. No results for input(s): AMMONIA in the last 168 hours. Coagulation Profile: No results for input(s): INR, PROTIME in the last 168 hours. Cardiac Enzymes: No results for input(s): CKTOTAL, CKMB, CKMBINDEX, TROPONINI in the last 168 hours. BNP (last 3 results) No results for input(s): PROBNP in the last 8760 hours. HbA1C: No results for input(s): HGBA1C in the last 72 hours. CBG: No results for input(s): GLUCAP in the last 168 hours. Lipid Profile: No results for input(s): CHOL, HDL, LDLCALC, TRIG, CHOLHDL, LDLDIRECT in the last 72 hours. Thyroid Function Tests: No results for input(s): TSH, T4TOTAL, FREET4, T3FREE, THYROIDAB in the last 72 hours. Anemia Panel: No results for input(s): VITAMINB12, FOLATE, FERRITIN, TIBC, IRON, RETICCTPCT in the last 72 hours. Sepsis Labs: No results for  input(s): PROCALCITON, LATICACIDVEN in the last 168 hours.  No results found for this or any previous visit (from the past 240 hour(s)).  Radiology Studies: No results found.  Scheduled Meds: . amantadine  100 mg Oral BID  . amLODipine  5 mg Oral Daily  . cloNIDine  0.2 mg Transdermal Weekly  . divalproex  1,000 mg Oral Q12H  . FLUoxetine  40 mg Oral Daily  . haloperidol  2 mg Oral TID  . heparin  5,000 Units Subcutaneous Q8H  . lamoTRIgine  50 mg Oral BID  . levETIRAcetam  500 mg Oral BID  . memantine  5 mg Oral QHS   Continuous Infusions:    LOS: 19 days    -Patient is independent with ADLs,  ambulatory  -- Inpatient psychiatric admission to a geropsychiatric facility continues to be recommended  -Patient is medically stable for discharge to inpatient psychiatric/behavioral unit when bed becomes available  Roxan Hockey, MD [facility]   If 7PM-7AM, please contact night-coverage [email or web address]  07/23/2020, 4:54 PM

## 2020-07-24 DIAGNOSIS — A5217 General paresis: Secondary | ICD-10-CM | POA: Diagnosis not present

## 2020-07-24 DIAGNOSIS — F250 Schizoaffective disorder, bipolar type: Secondary | ICD-10-CM | POA: Diagnosis not present

## 2020-07-24 DIAGNOSIS — F0280 Dementia in other diseases classified elsewhere without behavioral disturbance: Secondary | ICD-10-CM | POA: Diagnosis not present

## 2020-07-24 NOTE — TOC Progression Note (Addendum)
Transition of Care [facility]) - Progression Note    Patient Details  Name: Kendarrius Tanzi MRN: [id number] Date of Birth: 07/07/1941  Transition of [facility]) CM/SW Contact  Salome Arnt, [place] Phone Number: 07/24/2020, 12:26 PM  Clinical Narrative:   LCSW received call from pt's legal guardian, Darrick Meigs. Update provided that pt is currently on waitlist at [facility].     Expected Discharge Plan: Psychiatric Hospital Barriers to Discharge: Psych Bed not available  Expected Discharge Plan and Services Expected Discharge Plan: Psychiatric Hospital In-house Referral: Clinical Social Work     Living arrangements for the past 2 months: [place] Expected Discharge Date: 07/10/20                                     Social Determinants of Health (SDOH) Interventions    Readmission Risk Interventions Readmission Risk Prevention Plan 07/10/2020  Transportation Screening Complete  HRI or [place] Complete  Social Work Consult for [place] Planning/Counseling Complete  Palliative Care Screening Not Applicable  Medication Review [employer]) Complete  Some recent data might be hidden

## 2020-07-24 NOTE — [facility] Counselor (Signed)
TTS contacted [id number] to set up telepsych for reassessment. Per RN they just had an "episode" with the patient where he left his room and refused to go back. She reports it took multiple staff members to redirect him back to his room. She reports patient is still escalated and it may not be the best time to complete re-assessment. This counselor will call back a little bit later.

## 2020-07-24 NOTE — Progress Notes (Signed)
PROGRESS NOTE    Henry Smith  DZH:[id number] DOB: Jan 24, 1941 DOA: 07/03/2020 PCP: Caprice Renshaw, MD    Brief Narrative:  79 year old male with a history of neurosyphilis, dementia, schizoaffective disorder, COPD, thrombocytopenia, seizure disorder, who is a long-term resident of a nursing facility, was brought to the hospital with progressive changes in mental status.  Patient had been coming increasingly aggressive, was refusing any medications, becoming paranoid.  More recently, staff had noted that he was drinking his urine.  He was evaluated in the emergency room and felt that he may have a urinary tract infection was admitted to the hospital service.  Further review of urine studies indicate that his positive urine culture is likely a colonization and not a true infection.  He did not have any other signs of active infection.  Discussed with infectious disease who agreed with not treating with antibiotics.  It was felt that his progressive decline in mental status was related to his underlying behavioral health.  Seen by psychiatry with recommendations for inpatient psychiatry.  He is involuntarily committed.  Patient is medically stable for discharge whenever he can be placed by psychiatry. -Patient is independent with ADLs, ambulatory  -Patient is medically stable for discharge to inpatient psychiatric/behavioral unit when bed becomes available  Assessment & Plan:   Active Problems:   Schizoaffective disorder, bipolar type (HCC)   Thrombocytopenia (HCC)   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HCC)   Seizure ([facility])   Noncompliance with medication regimen   Acute metabolic encephalopathy   1)Progressive behavioral changes---  Initially felt to be related to possible UTI, but patient likely has colonization and not a true urinary tract infection.  He has been refusing his medications for weeks to months.  He is on several psychotropic medications such as Depakote, Haldol, Ativan.  He  also takes Lamictal, Keppra for seizures.  His behavior changes appear to be chronic progressive in nature and appear to be secondary to his underlying mental illness.  Other work-up including CT imaging of head, basic chemistry, ammonia, TSH, ABG were unrevealing.  Appreciate psychiatry input.  Recommendations are for inpatient psychiatry.  He is currently under involuntary commitment.---- - Inpatient psychiatric admission to a geropsychiatric facility continues to be recommended -Patient is medically stable for discharge to inpatient psychiatric/behavioral unit when bed becomes available  --- Poor compliance with medications and persistent psychotic type behavior disturbance persist  2)ESBL E coli  in urine--  Discussed with infectious disease, Dr. Drucilla Schmidt.  With no leukocytosis, fever, dysuria, unlikely to be an active infection.  Felt to be more related to colonization.  Antibiotics were not recommended in this situation.  3)Dementia Associate with Neurosyphilis---  Review of records indicate that he was treated for neurosyphilis in 2018.  He is chronically on Namenda which he has been refusing.  4)Thrombocytopenia--patient with some degree of mild pancytopenia which is stable--  Appears to be a chronic issue.  5)Seizure disorder.  Chronically on Keppra and Lamictal.  He has been refusing these medications.  He has IM Ativan as needed ordered if he has any breakthrough seizures.  6)Noncompliance.  Patient has been refusing medications for several weeks to months at this point.    7)HTN--BP is not at goal partly due to poor compliance with medications -  --- c/n  Clonidine 0.2mg    patch once weekly to try to improve compliance -Continue amlodipine 5 mg daily when patient agrees to take it   DVT prophylaxis: heparin injection 5,000 Units Start: 07/04/20 0600 SCDs Start: 07/04/20  0130  Code Status: full code Family Communication: No family present Disposition Plan: Status is:  Inpatient  Remains inpatient appropriate because:Unsafe d/c plan   Dispo: The patient is from: SNF              Anticipated d/c is to: Inpatient psychiatry              Anticipated d/c date is: 1 day              Patient currently is medically stable to d/c. ---- Inpatient psychiatric admission to a geropsychiatric facility continues to be recommended -Patient is medically stable for discharge to inpatient psychiatric/behavioral unit when bed becomes available  Consultants:   Psychiatry  Procedures:     Antimicrobials:      Subjective:  -Confusion, agitation and refusal to take medications persist  ---Patient is independent with ADLs, ambulatory -Patient is medically stable for discharge to inpatient psychiatric/behavioral unit when bed becomes available  07/24/20--occasional outburst of agitation and restlessness  Objective: Vitals:   07/20/20 0825 07/21/20 1533 07/22/20 0515 07/24/20 0858  BP:   (!) 150/94 (!) 182/96  Pulse:  92 100 77  Resp: 18 17 20    Temp:  98.4 F (36.9 C) 97.7 F (36.5 C)   TempSrc:      SpO2: 100% 94% 99%   Weight:      Height:        Intake/Output Summary (Last 24 hours) at 07/24/2020 1853 Last data filed at 07/24/2020 1700 Gross per 24 hour  Intake 1390 ml  Output --  Net 1390 ml   Filed Weights   07/03/20 1504 07/04/20 0200  Weight: 92.5 kg 97.3 kg    Examination:  General exam: Alert, awake, oriented x 3 Respiratory system: Clear to auscultation. Respiratory effort normal. Cardiovascular system:RRR. No murmurs, rubs, gallops. Gastrointestinal system: Abdomen is nondistended, soft and nontender. No organomegaly or masses felt. Normal bowel sounds heard. Central nervous system: Alert and oriented. No focal neurological deficits. Extremities: No C/C/E, +pedal pulses Skin: No rashes, lesions or ulcers Psychiatry: Calm, refusing medication, lacks insight into his medical issues  Data Reviewed:   CBC: Recent Labs  Lab  07/23/20 0503  WBC 2.3*  HGB 11.8*  HCT 39.2  MCV 91.2  PLT 132*   Basic Metabolic Panel: Recent Labs  Lab 07/23/20 0503  NA 143  K 3.9  CL 109  CO2 27  GLUCOSE 90  BUN 18  CREATININE 0.73  CALCIUM 9.6   GFR: Estimated Creatinine Clearance: 92 mL/min (by C-G formula based on SCr of 0.73 mg/dL). Liver Function Tests: No results for input(s): AST, ALT, ALKPHOS, BILITOT, PROT, ALBUMIN in the last 168 hours. No results for input(s): LIPASE, AMYLASE in the last 168 hours. No results for input(s): AMMONIA in the last 168 hours. Coagulation Profile: No results for input(s): INR, PROTIME in the last 168 hours. Cardiac Enzymes: No results for input(s): CKTOTAL, CKMB, CKMBINDEX, TROPONINI in the last 168 hours. BNP (last 3 results) No results for input(s): PROBNP in the last 8760 hours. HbA1C: No results for input(s): HGBA1C in the last 72 hours. CBG: No results for input(s): GLUCAP in the last 168 hours. Lipid Profile: No results for input(s): CHOL, HDL, LDLCALC, TRIG, CHOLHDL, LDLDIRECT in the last 72 hours. Thyroid Function Tests: No results for input(s): TSH, T4TOTAL, FREET4, T3FREE, THYROIDAB in the last 72 hours. Anemia Panel: No results for input(s): VITAMINB12, FOLATE, FERRITIN, TIBC, IRON, RETICCTPCT in the last 72 hours. Sepsis Labs: No results  for input(s): PROCALCITON, LATICACIDVEN in the last 168 hours.  No results found for this or any previous visit (from the past 240 hour(s)).  Radiology Studies: No results found.  Scheduled Meds: . amantadine  100 mg Oral BID  . amLODipine  5 mg Oral Daily  . cloNIDine  0.2 mg Transdermal Weekly  . divalproex  1,000 mg Oral Q12H  . FLUoxetine  40 mg Oral Daily  . haloperidol  2 mg Oral TID  . heparin  5,000 Units Subcutaneous Q8H  . lamoTRIgine  50 mg Oral BID  . levETIRAcetam  500 mg Oral BID  . memantine  5 mg Oral QHS   Continuous Infusions:    LOS: 20 days    -Patient is independent with ADLs,  ambulatory  -- Inpatient psychiatric admission to a geropsychiatric facility continues to be recommended  -Patient is medically stable for discharge to inpatient psychiatric/behavioral unit when bed becomes available  Roxan Hockey, MD [facility]   If 7PM-7AM, please contact night-coverage [email or web address]  07/24/2020, 6:53 PM

## 2020-07-24 NOTE — Progress Notes (Signed)
CSW spoke with [facility] admissions staff. Pt continues to be on their wait list.    Disposition will continue to follow.    Audree Camel, LCSW, [place] Disposition [facility] BHH/TTS [phone]

## 2020-07-25 DIAGNOSIS — F250 Schizoaffective disorder, bipolar type: Secondary | ICD-10-CM | POA: Diagnosis not present

## 2020-07-25 DIAGNOSIS — Z9114 Patient's other noncompliance with medication regimen: Secondary | ICD-10-CM | POA: Diagnosis not present

## 2020-07-25 DIAGNOSIS — G9341 Metabolic encephalopathy: Secondary | ICD-10-CM | POA: Diagnosis not present

## 2020-07-25 DIAGNOSIS — A5217 General paresis: Secondary | ICD-10-CM | POA: Diagnosis not present

## 2020-07-25 NOTE — TOC Progression Note (Signed)
Transition of Care [facility]) - Progression Note    Patient Details  Name: Henry Smith MRN: [id number] Date of Birth: 05/11/1941  [facility]) CM/SW Contact  Shade Flood, LCSW Phone Number: 07/25/2020, 1:40 PM  Clinical Narrative:     Spoke with Debbie at [facility] to update. Per Jackelyn Poling, they cannot take pt back unless he is cleared by Psychiatry AND he is compliant with his medications because when he isn't compliant with his medicine, his behavior in not something they can manage at the facility.  TTS note indicates pt will be assessed again today but at the moment, pt remains inpt psych appropriate and is on the waiting list at [facility].  TOC will follow.   Expected Discharge Plan: Psychiatric Hospital Barriers to Discharge: Psych Bed not available  Expected Discharge Plan and Services Expected Discharge Plan: Psychiatric Hospital In-house Referral: Clinical Social Work     Living arrangements for the past 2 months: [place] Expected Discharge Date: 07/10/20                                     Social Determinants of Health (SDOH) Interventions    Readmission Risk Interventions Readmission Risk Prevention Plan 07/10/2020  Transportation Screening Complete  HRI or [place] Complete  Social Work Consult for [place] Planning/Counseling Complete  Palliative Care Screening Not Applicable  Medication Review [employer] photographer) Complete  Some recent data might be hidden

## 2020-07-25 NOTE — Progress Notes (Signed)
PROGRESS NOTE    Henry Smith  ZOX:[id number] DOB: 15-Aug-1941 DOA: 07/03/2020 PCP: Caprice Renshaw, MD    Brief Narrative:  79 year old male with a history of neurosyphilis, dementia, schizoaffective disorder, COPD, thrombocytopenia, seizure disorder, who is a long-term resident of a nursing facility, was brought to the hospital with progressive changes in mental status.  Patient had been coming increasingly aggressive, was refusing any medications, becoming paranoid.  More recently, staff had noted that he was drinking his urine.  He was evaluated in the emergency room and felt that he may have a urinary tract infection was admitted to the hospital service.  Further review of urine studies indicate that his positive urine culture is likely a colonization and not a true infection.  He did not have any other signs of active infection.  Discussed with infectious disease who agreed with not treating with antibiotics.  It was felt that his progressive decline in mental status was related to his underlying behavioral health.  Seen by psychiatry with recommendations for inpatient psychiatry.  He is involuntarily committed.  Patient is medically stable for discharge whenever he can be placed by psychiatry. -Patient is independent with ADLs, ambulatory  -Patient is medically stable for discharge to inpatient psychiatric/behavioral unit when bed becomes available  Assessment & Plan:   Active Problems:   Schizoaffective disorder, bipolar type (HCC)   Thrombocytopenia (HCC)   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HCC)   Seizure ([place])   Noncompliance with medication regimen   Acute metabolic encephalopathy   1)Progressive behavioral changes---  Initially felt to be related to possible UTI, but patient likely has colonization and not a true urinary tract infection.  He has been refusing his medications for weeks to months.  He is on several psychotropic medications such as Depakote, Haldol, Ativan.  He  also takes Lamictal, Keppra for seizures.  His behavior changes appear to be chronic progressive in nature and appear to be secondary to his underlying mental illness.  Other work-up including CT imaging of head, basic chemistry, ammonia, TSH, ABG were unrevealing.  Appreciate psychiatry input.  Recommendations are for inpatient psychiatry.  He is currently under involuntary commitment.---- - Inpatient psychiatric admission to a geropsychiatric facility continues to be recommended -Patient is medically stable for discharge to inpatient psychiatric/behavioral unit when bed becomes available  --- Poor compliance with medications and persistent psychotic type behavior disturbance persist  2)ESBL E coli  in urine--  Discussed with infectious disease, Dr. Drucilla Schmidt.  With no leukocytosis, fever, dysuria, unlikely to be an active infection.  Felt to be more related to colonization.  Antibiotics were not recommended in this situation.  3)Dementia Associate with Neurosyphilis---  Review of records indicate that he was treated for neurosyphilis in 2018.  He is chronically on Namenda which he has been refusing.  4)Thrombocytopenia--patient with some degree of mild pancytopenia which is stable--  Appears to be a chronic issue.  5)Seizure disorder.  Chronically on Keppra and Lamictal.  He has been refusing these medications.  He has IM Ativan as needed ordered if he has any breakthrough seizures.  6)Noncompliance.  Patient has been refusing medications for several weeks to months at this point.    7)HTN--BP is not at goal partly due to poor compliance with medications -  --- c/n  Clonidine 0.2mg    patch once weekly to try to improve compliance -Continue amlodipine 5 mg daily when patient agrees to take it   DVT prophylaxis: heparin injection 5,000 Units Start: 07/04/20 0600 SCDs Start: 07/04/20  0130  Code Status: full code Family Communication: No family present Disposition Plan: Status is:  Inpatient  Remains inpatient appropriate because:Unsafe d/c plan   Dispo: The patient is from: SNF              Anticipated d/c is to: Inpatient psychiatry              Anticipated d/c date is: 1 day              Patient currently is medically stable to d/c. ---- Inpatient psychiatric admission to a geropsychiatric facility continues to be recommended -Patient is medically stable for discharge to inpatient psychiatric/behavioral unit when bed becomes available  Consultants:   Psychiatry  Procedures:     Antimicrobials:      Subjective:  ---Patient is independent with ADLs, ambulatory -Patient is medically stable for discharge to inpatient psychiatric/behavioral unit when bed becomes available  07/25/20--mostly cooperative today, still refusing medications from time to time  Objective: Vitals:   07/22/20 0515 07/24/20 0858 07/24/20 2219 07/25/20 0622  BP:  (!) 182/96 (!) 188/105 (!) 162/98  Pulse:  77 100 67  Resp: 20   18  Temp:   97.9 F (36.6 C) 97.8 F (36.6 C)  TempSrc:   Oral   SpO2: 99%  100% 100%  Weight:      Height:        Intake/Output Summary (Last 24 hours) at 07/25/2020 2010 Last data filed at 07/25/2020 1946 Gross per 24 hour  Intake 1080 ml  Output --  Net 1080 ml   Filed Weights   07/03/20 1504 07/04/20 0200  Weight: 92.5 kg 97.3 kg    Examination:  General exam: Alert, awake, oriented x 3 Respiratory system: Clear to auscultation. Respiratory effort normal. Cardiovascular system:RRR. No murmurs, rubs, gallops. Gastrointestinal system: Abdomen is nondistended, soft and nontender. No organomegaly or masses felt. Normal bowel sounds heard. Central nervous system: Alert and oriented. No focal neurological deficits. Extremities: No C/C/E, +pedal pulses Skin: No rashes, lesions or ulcers Psychiatry: Calm, refusing medication, lacks insight into his medical issues  Data Reviewed:   CBC: Recent Labs  Lab 07/23/20 0503  WBC 2.3*  HGB  11.8*  HCT 39.2  MCV 91.2  PLT 630*   Basic Metabolic Panel: Recent Labs  Lab 07/23/20 0503  NA 143  K 3.9  CL 109  CO2 27  GLUCOSE 90  BUN 18  CREATININE 0.73  CALCIUM 9.6   GFR: Estimated Creatinine Clearance: 92 mL/min (by C-G formula based on SCr of 0.73 mg/dL). Liver Function Tests: No results for input(s): AST, ALT, ALKPHOS, BILITOT, PROT, ALBUMIN in the last 168 hours. No results for input(s): LIPASE, AMYLASE in the last 168 hours. No results for input(s): AMMONIA in the last 168 hours. Coagulation Profile: No results for input(s): INR, PROTIME in the last 168 hours. Cardiac Enzymes: No results for input(s): CKTOTAL, CKMB, CKMBINDEX, TROPONINI in the last 168 hours. BNP (last 3 results) No results for input(s): PROBNP in the last 8760 hours. HbA1C: No results for input(s): HGBA1C in the last 72 hours. CBG: No results for input(s): GLUCAP in the last 168 hours. Lipid Profile: No results for input(s): CHOL, HDL, LDLCALC, TRIG, CHOLHDL, LDLDIRECT in the last 72 hours. Thyroid Function Tests: No results for input(s): TSH, T4TOTAL, FREET4, T3FREE, THYROIDAB in the last 72 hours. Anemia Panel: No results for input(s): VITAMINB12, FOLATE, FERRITIN, TIBC, IRON, RETICCTPCT in the last 72 hours. Sepsis Labs: No results for input(s): PROCALCITON, LATICACIDVEN  in the last 168 hours.  No results found for this or any previous visit (from the past 240 hour(s)).  Radiology Studies: No results found.  Scheduled Meds:  amantadine  100 mg Oral BID   amLODipine  5 mg Oral Daily   cloNIDine  0.2 mg Transdermal Weekly   divalproex  1,000 mg Oral Q12H   FLUoxetine  40 mg Oral Daily   haloperidol  2 mg Oral TID   heparin  5,000 Units Subcutaneous Q8H   lamoTRIgine  50 mg Oral BID   levETIRAcetam  500 mg Oral BID   memantine  5 mg Oral QHS   Continuous Infusions:    LOS: 21 days    -Patient is independent with ADLs, ambulatory  -- Inpatient psychiatric  admission to a geropsychiatric facility continues to be recommended  -Patient is medically stable for discharge to inpatient psychiatric/behavioral unit when bed becomes available  Roxan Hockey, MD [facility]   If 7PM-7AM, please contact night-coverage [email or web address]  07/25/2020, 8:10 PM

## 2020-07-25 NOTE — BH Assessment (Signed)
CSW spoke with Henry Smith in admissions at [facility] confirmed that the patient remains on the wait list.   TTS will re-assess the patient today.    TTS/Dispositon will continue to follow for possible placement.    Radonna Ricker, MSW, LCSW Clinical Social Worker [place]

## 2020-07-26 MED ORDER — HALOPERIDOL LACTATE 5 MG/ML IJ SOLN
10.0000 mg | Freq: Once | INTRAMUSCULAR | Status: AC
  Administered 2020-07-26: 10 mg via INTRAMUSCULAR
  Filled 2020-07-26: qty 2

## 2020-07-26 NOTE — Progress Notes (Signed)
Pt in bed at this time, appears to be sleeping, no verbal response to staff, resp even and non-labored.

## 2020-07-26 NOTE — BH Assessment (Addendum)
Patient remains on wait list for [facility] faxed updated documentation to [facility] admissions department for review for patient to remain on priority list.   CSW spoke with Baxter Flattery in [facility] Admissions. Per Baxter Flattery, the patient's information would be reviewed and if there were any updates regarding an available bed, [facility] would contact the hospital.    TTS/Disposition will continue to follow for potential placement.     Radonna Ricker, MSW, LCSW Clinical Social Worker [place]

## 2020-07-26 NOTE — Progress Notes (Signed)
Writer attempted to assess patient x 2. Unable to locate cart. Asked to call back. Will continue to reassess patient.

## 2020-07-26 NOTE — Clinical Social Work Note (Signed)
IVC paperwork was completed and notarized. Paperwork faxed to [employer] office. CSW confirmed with magistrate's office it has been received.
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
## 2020-07-26 NOTE — Progress Notes (Signed)
Pt sitting in chair in room, clenching jaws and fists but no outward displays of violence at this time. Still refuses to speak or answer any questions. Pt was administered Haldol 10mg  IM L)DG and Ativan 2mg  IM R)DG per verbal order of Dr. Nathen May. Pt was cooperative with injections, 2 security guards, 1 police officer and 1 nurse tech and 3 nurses present in room at time of injection. Sitting in chair with sitter, security and PD present.

## 2020-07-27 DIAGNOSIS — F0280 Dementia in other diseases classified elsewhere without behavioral disturbance: Secondary | ICD-10-CM | POA: Diagnosis not present

## 2020-07-27 DIAGNOSIS — A5217 General paresis: Secondary | ICD-10-CM | POA: Diagnosis not present
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
## 2020-07-27 NOTE — Progress Notes (Signed)
PROGRESS NOTE    Henry Smith  VFI:[id number] DOB: Jan 12, 1941 DOA: 07/03/2020 PCP: Caprice Renshaw, MD    Brief Narrative:  79 year old male with a history of neurosyphilis, dementia, schizoaffective disorder, COPD, thrombocytopenia, seizure disorder, who is a long-term resident of a nursing facility, was brought to the hospital with progressive changes in mental status.  Patient had been coming increasingly aggressive, was refusing any medications, becoming paranoid.  More recently, staff had noted that he was drinking his urine.  He was evaluated in the emergency room and felt that he may have a urinary tract infection was admitted to the hospital service.  Further review of urine studies indicate that his positive urine culture is likely a colonization and not a true infection.  He did not have any other signs of active infection.  Discussed with infectious disease who agreed with not treating with antibiotics.  It was felt that his progressive decline in mental status was related to his underlying behavioral health.  Seen by psychiatry with recommendations for inpatient psychiatry.  He is involuntarily committed.  Patient is medically stable for discharge whenever he can be placed by psychiatry. 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[place]) Active Problems:   Schizoaffective disorder, bipolar type ([facility])   Thrombocytopenia ([place])   Seizure ([place])   Noncompliance with medication regimen   Acute metabolic encephalopathy   -In summary this is 79 year old male with past medical history relevant for schizoaffective disorder, bipolar type and history of neurosyphilis with dementia with ongoing episodes of occasional agitation and psychotic type behavior--- patient continues to have occasional  episodes of significant restlessness, becomes uncooperative and verbally aggressive from time to time  1)Progressive behavioral changes---  Initially felt to be related to possible UTI, but patient likely has colonization and not a true urinary tract infection.  He has been refusing his medications for weeks to months.  He is on several psychotropic medications such as Depakote, Haldol, Ativan.  He also takes Lamictal, Keppra for seizures.  His behavior changes appear to be chronic progressive in nature and appear to be secondary to his underlying mental illness.  Other work-up including CT imaging of head, basic chemistry, ammonia, TSH, ABG were unrevealing.  Appreciate psychiatry input.  Recommendations are for inpatient psychiatry.  He is currently under involuntary commitment.---- - Inpatient psychiatric admission to a geropsychiatric facility continues to be recommended -Patient is medically stable for discharge to inpatient psychiatric/behavioral unit when bed becomes available  --- Poor compliance with medications and persistent psychotic type behavior disturbance persist --IVC paperwork resubmitted on 07/26/2020  2)ESBL E coli  in urine--  Discussed with infectious disease, Dr. Drucilla Schmidt.  With no leukocytosis, fever, dysuria, unlikely to be an active infection.  Felt to be more related to colonization.  Antibiotics were not recommended in this situation.  3)Dementia Associate with Neurosyphilis---  Review of records indicate that he was treated for neurosyphilis in 2018.  He is chronically on Namenda which he has been refusing.  4)Thrombocytopenia--patient with some degree of mild pancytopenia which is stable--  Appears to be a chronic issue.  5)Seizure disorder.  Chronically on Keppra and Lamictal.  He has been refusing these medications.  He has IM Ativan as needed ordered if he has any breakthrough seizures.  6)Noncompliance.  Patient has been refusing medications for several weeks to months  at  this point.    7)HTN--BP is not at goal partly due to poor compliance with medications -  --- c/n  Clonidine 0.2mg    patch once weekly to try to improve compliance -Continue amlodipine 5 mg daily when patient agrees to take it   DVT prophylaxis: heparin injection 5,000 Units Start: 07/04/20 0600 SCDs Start: 07/04/20 0130  Code Status: full code Family Communication: No family present Disposition Plan: Status is: Inpatient  Remains inpatient appropriate because:Unsafe d/c plan   Dispo: The patient is from: SNF              Anticipated d/c is to: Inpatient psychiatry              Anticipated d/c date is: 1 day              Patient currently is medically stable to d/c. ---- Inpatient psychiatric admission to a geropsychiatric facility continues to be recommended -Patient is medically stable for discharge to inpatient psychiatric/behavioral unit when bed becomes available  Consultants:   Psychiatry  Procedures:     Antimicrobials:      Subjective:  ---Patient is independent with ADLs, ambulatory -Patient is medically stable for discharge to inpatient psychiatric/behavioral unit when bed becomes available  07/27/20-- ambulated in hallways with tech, he actually ended up pulling the fire alarm -Patient was redirected back to his room,.  received IM Haldol  Objective: Vitals:   07/24/20 0858 07/24/20 2219 07/25/20 0622 07/25/20 2027  BP: (!) 182/96 (!) 188/105 (!) 162/98 (!) 144/79  Pulse: 77 100 67 90  Resp:   18 18  Temp:  97.9 F (36.6 C) 97.8 F (36.6 C) 98.4 F (36.9 C)  TempSrc:  Oral  Oral  SpO2:  100% 100% 100%  Weight:      Height:        Intake/Output Summary (Last 24 hours) at 07/27/2020 2014 Last data filed at 07/27/2020 1552 Gross per 24 hour  Intake 720 ml  Output --  Net 720 ml   Filed Weights   07/03/20 1504 07/04/20 0200  Weight: 92.5 kg 97.3 kg    Examination:  General exam: Alert, awake, oriented x 3 Respiratory system: Clear to  auscultation. Respiratory effort normal. Cardiovascular system:RRR. No murmurs, rubs, gallops. Gastrointestinal system: Abdomen is nondistended, soft and nontender. No organomegaly or masses felt. Normal bowel sounds heard. Central nervous system: Alert and oriented. No focal neurological deficits. Extremities: No C/C/E, +pedal pulses Skin: No rashes, lesions or ulcers Psychiatry: Calm, refusing medication, lacks insight into his medical issues  Data Reviewed:   CBC: Recent Labs  Lab 07/23/20 0503  WBC 2.3*  HGB 11.8*  HCT 39.2  MCV 91.2  PLT 301*   Basic Metabolic Panel: Recent Labs  Lab 07/23/20 0503  NA 143  K 3.9  CL 109  CO2 27  GLUCOSE 90  BUN 18  CREATININE 0.73  CALCIUM 9.6   GFR: Estimated Creatinine Clearance: 92 mL/min (by C-G formula based on SCr of 0.73 mg/dL). Liver Function Tests: No results for input(s): AST, ALT, ALKPHOS, BILITOT, PROT, ALBUMIN in the last 168 hours. No results for input(s): LIPASE, AMYLASE in the last 168 hours. No results for input(s): AMMONIA in the last 168 hours. Coagulation Profile: No results for input(s): INR, PROTIME in the last 168 hours. Cardiac Enzymes: No results for input(s): CKTOTAL, CKMB, CKMBINDEX, TROPONINI in the last 168 hours. BNP (last 3 results) No results for input(s): PROBNP in the last 8760 hours. HbA1C: No results for  input(s): HGBA1C in the last 72 hours. CBG: No results for input(s): GLUCAP in the last 168 hours. Lipid Profile: No results for input(s): CHOL, HDL, LDLCALC, TRIG, CHOLHDL, LDLDIRECT in the last 72 hours. Thyroid Function Tests: No results for input(s): TSH, T4TOTAL, FREET4, T3FREE, THYROIDAB in the last 72 hours. Anemia Panel: No results for input(s): VITAMINB12, FOLATE, FERRITIN, TIBC, IRON, RETICCTPCT in the last 72 hours. Sepsis Labs: No results for input(s): PROCALCITON, LATICACIDVEN in the last 168 hours.  No results found for this or any previous visit (from the past 240  hour(s)).  Radiology Studies: No results found.  Scheduled Meds: . amantadine  100 mg Oral BID  . amLODipine  5 mg Oral Daily  . cloNIDine  0.2 mg Transdermal Weekly  . divalproex  1,000 mg Oral Q12H  . FLUoxetine  40 mg Oral Daily  . haloperidol  2 mg Oral TID  . heparin  5,000 Units Subcutaneous Q8H  . lamoTRIgine  50 mg Oral BID  . levETIRAcetam  500 mg Oral BID  . memantine  5 mg Oral QHS   Continuous Infusions:   LOS: 23 days    -Patient is independent with ADLs, ambulatory  -- Inpatient psychiatric admission to a geropsychiatric facility continues to be recommended  -Patient is medically stable for discharge to inpatient psychiatric/behavioral unit when bed becomes available  Roxan Hockey, MD [facility]   If 7PM-7AM, please contact night-coverage [email or web address]  07/27/2020, 8:14 PM

## 2020-07-27 NOTE — Progress Notes (Signed)
Patient ambulated again in hallway with tech, attempting to go into patients' rooms. Staff attempted to redirect patient with no success. Patient looked at fire alarm and this nurse attempted to redirect patietn back to room. Patient reached and activated fire alarm. Patient then ambulated back to room. Security, Therapist, art at patient's room. PRN IM Haldol given. Patient sitting in recliner. Patient allowed this nurse with assistance of another nurse to administered IM injection. Will continue to monitor.

## 2020-07-27 NOTE — Progress Notes (Signed)
Pt physically and verbally aggressive towards sitter. Sitter denies harm received.   Pt now sitting on side of bed. Haldol IM given per orders- pt verbalized ok to give. Will report to oncoming shift of events

## 2020-07-27 NOTE — Progress Notes (Signed)
This nurse entered room and told patient this nurse had his medications. Patient stated "I ain't taking no meds today." Ambulating in hall with nurse tech, no aggressive behavior at this time.

## 2020-07-27 NOTE — Progress Notes (Signed)
Patient meets criteria for inpatient treatment. No appropriate or available beds at [facility]. CSW faxed referrals to the following facilities for review:  [facility]   [facility]   [facility]    TTS will continue to seek bed placement.  Chalmers Guest. Guerry Bruin, MSW, [place] Work/Disposition Phone: [phone] Fax: [phone]

## 2020-07-28 DIAGNOSIS — F0280 Dementia in other diseases classified elsewhere without behavioral disturbance: Secondary | ICD-10-CM | POA: Diagnosis not present

## 2020-07-28 DIAGNOSIS — A5217 General paresis: Secondary | ICD-10-CM | POA: Diagnosis not present

## 2020-07-28 LAB — COMPREHENSIVE METABOLIC PANEL
ALT: 21 U/L (ref 0–44)
AST: 20 U/L (ref 15–41)
Albumin: 3.3 g/dL — ABNORMAL LOW (ref 3.5–5.0)
Alkaline Phosphatase: 57 U/L (ref 38–126)
Anion gap: 9 (ref 5–15)
BUN: 16 mg/dL (ref 8–23)
CO2: 22 mmol/L (ref 22–32)
Calcium: 9 mg/dL (ref 8.9–10.3)
Chloride: 107 mmol/L (ref 98–111)
Creatinine, Ser: 0.84 mg/dL (ref 0.61–1.24)
GFR calc Af Amer: >60 mL/min (ref >60)
GFR calc non Af Amer: >60 mL/min (ref >60)
Glucose, Bld: 90 mg/dL (ref 70–99)
Potassium: 3.9 mmol/L (ref 3.5–5.1)
Sodium: 138 mmol/L (ref 135–145)
Total Bilirubin: 0.6 mg/dL (ref 0.3–1.2)
Total Protein: 6.4 g/dL — ABNORMAL LOW (ref 6.5–8.1)

## 2020-07-28 LAB — DIFFERENTIAL
Abs Immature Granulocytes: 0.01 10*3/uL (ref 0.00–0.07)
Basophils Absolute: 0 10*3/uL (ref 0.0–0.1)
Basophils Relative: 1 %
Eosinophils Absolute: 0.1 10*3/uL (ref 0.0–0.5)
Eosinophils Relative: 6 %
Immature Granulocytes: 1 %
Lymphocytes Relative: 35 %
Lymphs Abs: 0.8 10*3/uL (ref 0.7–4.0)
Monocytes Absolute: 0.2 10*3/uL (ref 0.1–1.0)
Monocytes Relative: 10 %
Neutro Abs: 1 10*3/uL — ABNORMAL LOW (ref 1.7–7.7)
Neutrophils Relative %: 47 %

## 2020-07-28 LAB — CBC
HCT: 37.9 % — ABNORMAL LOW (ref 39.0–52.0)
Hemoglobin: 11.5 g/dL — ABNORMAL LOW (ref 13.0–17.0)
MCH: 27.9 pg (ref 26.0–34.0)
MCHC: 30.3 g/dL (ref 30.0–36.0)
MCV: 92 fL (ref 80.0–100.0)
Platelets: 97 10*3/uL — ABNORMAL LOW (ref 150–400)
RBC: 4.12 MIL/uL — ABNORMAL LOW (ref 4.22–5.81)
RDW: 14.6 % (ref 11.5–15.5)
WBC: 2.2 10*3/uL — ABNORMAL LOW (ref 4.0–10.5)
nRBC: 0 % (ref 0.0–0.2)

## 2020-07-28 LAB — VALPROIC ACID LEVEL: Valproic Acid Lvl: 35 ug/mL — ABNORMAL LOW (ref 50.0–100.0)

## 2020-07-28 MED ORDER — CARBAMAZEPINE 100 MG PO CHEW
100.0000 mg | CHEWABLE_TABLET | Freq: Three times a day (TID) | ORAL | Status: DC
  Administered 2020-07-28 – 2020-08-07 (×18): 100 mg via ORAL
  Filled 2020-07-28 (×35): qty 1

## 2020-07-28 MED ORDER — MEMANTINE HCL 10 MG PO TABS
10.0000 mg | ORAL_TABLET | Freq: Every day | ORAL | Status: DC
  Administered 2020-07-28 – 2020-09-09 (×33): 10 mg via ORAL
  Filled 2020-07-28 (×41): qty 1

## 2020-07-28 MED ORDER — CARBAMAZEPINE 100 MG PO CHEW
100.0000 mg | CHEWABLE_TABLET | Freq: Three times a day (TID) | ORAL | Status: DC
  Filled 2020-07-28 (×4): qty 1

## 2020-07-28 NOTE — Progress Notes (Signed)
Pt left room non redirectable by sitter. Staff followed pt. Security notified. Pt went out fire escape and down two flights of stairs. Staff and security had to redirect pt back to room. Pt did not fall and does not have any injuries. Pt given Haldol IM and currently sitting in his chair. Will continue to monitor pt.

## 2020-07-28 NOTE — Progress Notes (Signed)
Dr. Denton Brick in room discussing need for medication. This nurse brought medications in and explained the need for each of them. Patient agreeable to take all but Haldol. Patient took all meds out of cup except for Lamictal. When patient asked to take Lamictal, patient says "I'm not taking it. I've never had a fucking seizure I'm not taking it." This nurse attempted to explain the need for the medication again and patient says "You're fired. Fuck you bitch, get out of my room." This nurse told patient not to be disrespectful and patient says again "Fuck you bitch. Get the fuck out my room before I beat your ass." The nurse takes pill cup and remaining pill and exits room. Will notify MD. Kennon Holter remains at room.

## 2020-07-28 NOTE — Progress Notes (Signed)
PROGRESS NOTE    Henry Smith  EXB:[id number] DOB: 09-19-1941 DOA: 07/03/2020 PCP: Caprice Renshaw, MD    Brief Narrative:  79 year old male with a history of neurosyphilis, dementia, schizoaffective disorder, COPD, thrombocytopenia, seizure disorder, who is a long-term resident of a nursing facility, was brought to the hospital with progressive changes in mental status.  Patient had been coming increasingly aggressive, was refusing any medications, becoming paranoid.  More recently, staff had noted that he was drinking his urine.  He was evaluated in the emergency room and felt that he may have a urinary tract infection was admitted to the hospital service.  Further review of urine studies indicate that his positive urine culture is likely a colonization and not a true infection.  He did not have any other signs of active infection.  Discussed with infectious disease who agreed with not treating with antibiotics.  It was felt that his progressive decline in mental status was related to his underlying behavioral health.  Seen by psychiatry with recommendations for inpatient psychiatry.  He is involuntarily committed.  Patient is medically stable for discharge whenever he can be placed by psychiatry. 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) Active Problems:   Schizoaffective disorder, bipolar type ([facility])   Thrombocytopenia ([place])   Seizure ([place])   Noncompliance with medication regimen   Acute metabolic encephalopathy   -In summary this is 79 year old male with past medical history relevant for schizoaffective disorder, bipolar type and history of neurosyphilis with dementia with ongoing episodes of occasional agitation and psychotic type behavior--- patient continues to have occasional  episodes of significant restlessness, becomes uncooperative and verbally aggressive from time to time   1)Progressive Behavioral Changes---  Initially felt to be related to possible UTI, but patient likely has colonization and not a true urinary tract infection.  He has been refusing his medications for weeks to months.  He is on several psychotropic medications such as Depakote, Haldol, Ativan.  He also takes Lamictal, Keppra for seizures.  His behavior changes appear to be chronic progressive in nature and appear to be secondary to his underlying mental illness.  Other work-up including CT imaging of head, basic chemistry, ammonia, TSH, ABG were unrevealing.  Appreciate psychiatry input.  Recommendations are for inpatient psychiatry.  He is currently under involuntary commitment.---- - Inpatient psychiatric admission to a geropsychiatric facility continues to be recommended -Patient is medically stable for discharge to inpatient psychiatric/behavioral unit when bed becomes available  --- Poor compliance with medications and persistent psychotic type behavior disturbance persist --IVC paperwork resubmitted on 07/26/2020 - 07/28/2020 After further eval by psych provider-- Thomasene Mohair, NP made changes to pt's regimen----  --Depakote was dced bcos it is difficult to monitor levels (pt is not always cooperative with lab draws)--- -- Scheduled--c/n Amantadine, Namenda, Haldol, Prozac ,Clonidine patch  Prn-- Im Haldol and im Ativan   Changes:- Stop Depakote as above Start Tegretol 100 mg Tid  2)ESBL E coli  in urine--  Discussed with infectious disease, Dr. Drucilla Schmidt.  With no leukocytosis, fever, dysuria, unlikely to be an active infection.  Felt to be more related to colonization.  Antibiotics were not recommended in this situation.  3)Dementia Associate with Neurosyphilis---  Review of records indicate that he was treated for neurosyphilis in 2018.  He is chronically on Namenda which he has been  refusing.  4)Thrombocytopenia--patient with some degree of mild pancytopenia which is stable--  Appears to be a chronic issue.  5)Seizure disorder---- Chronically on Keppra and Lamictal.   --He has been refusing these medications.  He has IM Ativan as needed ordered if he has any breakthrough seizures.  6)Noncompliance----  Patient has been refusing medications for several weeks to months at this point.    7)HTN--BP is not at goal partly due to poor compliance with medications -  --- c/n  Clonidine 0.2mg   patch once weekly to try to improve compliance -Continue amlodipine 5 mg daily when patient agrees to take it  DVT prophylaxis: heparin injection 5,000 Units Start: 07/04/20 0600 SCDs Start: 07/04/20 0130  Code Status: full code Family Communication: No family present Disposition Plan: Status is: Inpatient  Remains inpatient appropriate because:Unsafe d/c plan   Dispo: The patient is from: SNF              Anticipated d/c is to: Inpatient psychiatry              Anticipated d/c date is: 1 day              Patient currently is medically stable to d/c. ---- Inpatient psychiatric admission to a geropsychiatric facility continues to be recommended -Patient is medically stable for discharge to inpatient psychiatric/behavioral unit when bed becomes available  Consultants:   Psychiatry  Procedures:     Antimicrobials:      Subjective:  ---Patient is independent with ADLs, ambulatory -Patient is medically stable for discharge to inpatient psychiatric/behavioral unit when bed becomes available  07/28/20-- cooperated with phlebotomist (blood draw) while I was in the room --Was verbally abusive still RN apparently after I left the room -Please see separate documentation from RN  Objective: Vitals:   07/24/20 2219 07/25/20 0622 07/25/20 2027 07/27/20 2041  BP: (!) 188/105 (!) 162/98 (!) 144/79 (!) 146/92  Pulse: 100 67 90 82  Resp:  18 18 20   Temp: 97.9 F (36.6 C)  97.8 F (36.6 C) 98.4 F (36.9 C) 97.9 F (36.6 C)  TempSrc: Oral  Oral Oral  SpO2: 100% 100% 100% 99%  Weight:      Height:        Intake/Output Summary (Last 24 hours) at 07/28/2020 1245 Last data filed at 07/28/2020 0903 Gross per 24 hour  Intake 2160 ml  Output --  Net 2160 ml   Filed Weights   07/03/20 1504 07/04/20 0200  Weight: 92.5 kg 97.3 kg    Examination:  General exam: Alert, awake, oriented x 3 Respiratory system: Clear to auscultation. Respiratory effort normal. Cardiovascular system:RRR. No murmurs, rubs, gallops. Gastrointestinal system: Abdomen is nondistended, soft and nontender.  Normal bowel sounds heard. Central nervous system: Alert and oriented. No focal neurological deficits. Extremities: No C/C/E, +pedal pulses Skin: No rashes, lesions or ulcers Psychiatry: Calm, refusing medication, lacks insight into his medical issues  Data Reviewed:   CBC: Recent Labs  Lab 07/23/20 0503 07/28/20 1228  WBC 2.3* 2.2*  HGB 11.8* 11.5*  HCT 39.2 37.9*  MCV 91.2 92.0  PLT 104* 97*   Basic Metabolic Panel: Recent Labs  Lab 07/23/20 0503  NA 143  K 3.9  CL 109  CO2 27  GLUCOSE 90  BUN 18  CREATININE 0.73  CALCIUM 9.6   GFR: Estimated Creatinine Clearance: 92 mL/min (by C-G formula based on SCr of 0.73 mg/dL). Liver Function Tests: No results for input(s): AST, ALT, ALKPHOS, BILITOT, PROT, ALBUMIN in  the last 168 hours. No results for input(s): LIPASE, AMYLASE in the last 168 hours. No results for input(s): AMMONIA in the last 168 hours. Coagulation Profile: No results for input(s): INR, PROTIME in the last 168 hours. Cardiac Enzymes: No results for input(s): CKTOTAL, CKMB, CKMBINDEX, TROPONINI in the last 168 hours. BNP (last 3 results) No results for input(s): PROBNP in the last 8760 hours. HbA1C: No results for input(s): HGBA1C in the last 72 hours. CBG: No results for input(s): GLUCAP in the last 168 hours. Lipid Profile: No results  for input(s): CHOL, HDL, LDLCALC, TRIG, CHOLHDL, LDLDIRECT in the last 72 hours. Thyroid Function Tests: No results for input(s): TSH, T4TOTAL, FREET4, T3FREE, THYROIDAB in the last 72 hours. Anemia Panel: No results for input(s): VITAMINB12, FOLATE, FERRITIN, TIBC, IRON, RETICCTPCT in the last 72 hours. Sepsis Labs: No results for input(s): PROCALCITON, LATICACIDVEN in the last 168 hours.  No results found for this or any previous visit (from the past 240 hour(s)).  Radiology Studies: No results found.  Scheduled Meds: . amantadine  100 mg Oral BID  . amLODipine  5 mg Oral Daily  . carbamazepine  100 mg Oral TID  . cloNIDine  0.2 mg Transdermal Weekly  . FLUoxetine  40 mg Oral Daily  . haloperidol  2 mg Oral TID  . heparin  5,000 Units Subcutaneous Q8H  . lamoTRIgine  50 mg Oral BID  . levETIRAcetam  500 mg Oral BID  . memantine  10 mg Oral QHS   Continuous Infusions:   LOS: 24 days    -Patient is independent with ADLs, ambulatory  -- Inpatient psychiatric admission to a geropsychiatric facility continues to be recommended  -Patient is medically stable for discharge to inpatient psychiatric/behavioral unit when bed becomes available  Roxan Hockey, MD [facility]   If 7PM-7AM, please contact night-coverage [email or web address]  07/28/2020, 12:45 PM

## 2020-07-29 MED ORDER — RISPERIDONE 1 MG PO TBDP
1.0000 mg | ORAL_TABLET | Freq: Once | ORAL | Status: AC
  Administered 2020-07-29: 1 mg via ORAL
  Filled 2020-07-29: qty 1

## 2020-07-29 NOTE — Progress Notes (Signed)
PROGRESS NOTE    Henry Smith  KDT:[id number] DOB: [birth year] DOA: 07/03/2020 PCP: Caprice Renshaw, MD    Brief Narrative:  79 year old male with a history of neurosyphilis, dementia, schizoaffective disorder, COPD, thrombocytopenia, seizure disorder, who is a long-term resident of a nursing facility, was brought to the hospital with progressive changes in mental status.  Patient had been coming increasingly aggressive, was refusing any medications, becoming paranoid.  More recently, staff had noted that he was drinking his urine.  He was evaluated in the emergency room and felt that he may have a urinary tract infection was admitted to the hospital service.  Further review of urine studies indicate that his positive urine culture is likely a colonization and not a true infection.  He did not have any other signs of active infection.  Discussed with infectious disease who agreed with not treating with antibiotics.  It was felt that his progressive decline in mental status was related to his underlying behavioral health.  Seen by psychiatry with recommendations for inpatient psychiatry.  He is involuntarily committed.  Patient is medically stable for discharge whenever he can be placed by psychiatry. 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[place]) Active Problems:   Schizoaffective disorder, bipolar type (H[facility])   Thrombocytopenia ([place])   Seizure ([place])   Noncompliance with medication regimen   Acute metabolic encephalopathy   -In summary this is 79 year old male with past medical history relevant for schizoaffective disorder, bipolar type and history of neurosyphilis with dementia with ongoing episodes of occasional agitation and psychotic type behavior--- patient continues to have occasional  episodes of significant restlessness, becomes uncooperative and verbally aggressive from time to time   1)Progressive Behavioral Changes---  Initially felt to be related to possible UTI, but patient likely has colonization and not a true urinary tract infection.  He has been refusing his medications for weeks to months.  He is on several psychotropic medications such as Depakote, Haldol, Ativan.  He also takes Lamictal, Keppra for seizures.  His behavior changes appear to be chronic progressive in nature and appear to be secondary to his underlying mental illness.  Other work-up including CT imaging of head, basic chemistry, ammonia, TSH, ABG were unrevealing.  Appreciate psychiatry input.  Recommendations are for inpatient psychiatry.  He is currently under involuntary commitment.---- - Inpatient psychiatric admission to a geropsychiatric facility continues to be recommended -Patient is medically stable for discharge to inpatient psychiatric/behavioral unit when bed becomes available  --- Poor compliance with medications and persistent psychotic type behavior disturbance persist --IVC paperwork resubmitted on 07/26/2020  - 07/29/2020 --Requesting that psychiatric team evaluate medication list for IM medication recommendations. Patient non-compliant with PO meds and violent. Is IM Invega a possibility?  After further eval by psych provider-- Thomasene Mohair, NP made changes to pt's regimen----  --Depakote was dced bcos it is difficult to monitor levels (pt is not always cooperative with lab draws)--- -- Scheduled--c/n Amantadine, Namenda, Haldol, Prozac ,Clonidine patch  Prn-- Im Haldol and im Ativan   Changes:- Stop Depakote as above Start Tegretol 100 mg Tid --Awaiting decision from psychiatric team to see if Invega IM will be a better option  2)ESBL E coli  in urine--  Discussed with infectious disease, Dr. Drucilla Schmidt.  With no leukocytosis, fever, dysuria, unlikely to be an active infection.   Felt to  be more related to colonization.  Antibiotics were not recommended in this situation.  3)Dementia Associate with Neurosyphilis---  Review of records indicate that he was treated for neurosyphilis in 2018.  He is chronically on Namenda which he has been refusing.  4)Thrombocytopenia/pancytopenia--patient with some degree of mild pancytopenia which is stable--  Appears to be a chronic issue.  5)Seizure disorder---- Chronically on Keppra and Lamictal.   --He has been refusing these medications.  He has IM Ativan as needed ordered if he has any breakthrough seizures.  6)Noncompliance----  Patient has been refusing medications for several weeks to months at this point.    7)HTN--BP is not at goal partly due to poor compliance with medications -  --- c/n  Clonidine 0.2mg   patch once weekly to try to improve compliance -Continue amlodipine 5 mg daily when patient agrees to take it  DVT prophylaxis: heparin injection 5,000 Units Start: 07/04/20 0600 SCDs Start: 07/04/20 0130  Code Status: full code Family Communication: No family present Disposition Plan: Status is: Inpatient  Remains inpatient appropriate because:Unsafe d/c plan   Dispo: The patient is from: SNF              Anticipated d/c is to: Inpatient psychiatry              Anticipated d/c date is: 1 day              Patient currently is medically stable to d/c. ---- Inpatient psychiatric admission to a geropsychiatric facility continues to be recommended -Patient is medically stable for discharge to inpatient psychiatric/behavioral unit when bed becomes available  Consultants:   Psychiatry  Procedures:     Antimicrobials:      Subjective:  ---Patient is independent with ADLs, ambulatory -Patient is medically stable for discharge to inpatient psychiatric/behavioral unit when bed becomes available  07/29/20--  -Requesting that psychiatric team evaluate medication list for IM medication recommendations.  Patient non-compliant with PO meds and violent. Is IM Invega a possibility?  Objective: Vitals:   07/25/20 0622 07/25/20 2027 07/27/20 2041 07/29/20 0544  BP: (!) 162/98 (!) 144/79 (!) 146/92 (!) 173/97  Pulse: 67 90 82 66  Resp: 18 18 20    Temp: 97.8 F (36.6 C) 98.4 F (36.9 C) 97.9 F (36.6 C) 97.8 F (36.6 C)  TempSrc:  Oral Oral Oral  SpO2: 100% 100% 99% 97%  Weight:      Height:        Intake/Output Summary (Last 24 hours) at 07/29/2020 1221 Last data filed at 07/29/2020 1038 Gross per 24 hour  Intake 480 ml  Output --  Net 480 ml   Filed Weights   07/03/20 1504 07/04/20 0200  Weight: 92.5 kg 97.3 kg    Examination:  General exam: Alert, awake, oriented x 3 Respiratory system: Clear to auscultation. Respiratory effort normal. Cardiovascular system:RRR. No murmurs, rubs, gallops. Gastrointestinal system: Abdomen is nondistended, soft and nontender.  Normal bowel sounds heard. Central nervous system: Alert and oriented. No focal neurological deficits. Extremities: No C/C/E, +pedal pulses Skin: No rashes, lesions or ulcers Psychiatry: Episodes of disorientation and agitation from time to time,  refusing medication, lacks insight into his medical issues  Data Reviewed:   CBC: Recent Labs  Lab 07/23/20 0503 07/28/20 1228  WBC 2.3* 2.2*  NEUTROABS  --  1.0*  HGB 11.8* 11.5*  HCT 39.2 37.9*  MCV 91.2 92.0  PLT 104* 97*   Basic Metabolic Panel: Recent Labs  Lab 07/23/20 0503 07/28/20 1228  NA 143 138  K 3.9 3.9  CL 109 107  CO2 27 22  GLUCOSE 90 90  BUN 18 16  CREATININE 0.73 0.84  CALCIUM 9.6 9.0   GFR: Estimated Creatinine Clearance: 87.6 mL/min (by C-G formula based on SCr of 0.84 mg/dL). Liver Function Tests: Recent Labs  Lab 07/28/20 1228  AST 20  ALT 21  ALKPHOS 57  BILITOT 0.6  PROT 6.4*  ALBUMIN 3.3*   No results for input(s): LIPASE, AMYLASE in the last 168 hours. No results for input(s): AMMONIA in the last 168  hours. Coagulation Profile: No results for input(s): INR, PROTIME in the last 168 hours. Cardiac Enzymes: No results for input(s): CKTOTAL, CKMB, CKMBINDEX, TROPONINI in the last 168 hours. BNP (last 3 results) No results for input(s): PROBNP in the last 8760 hours. HbA1C: No results for input(s): HGBA1C in the last 72 hours. CBG: No results for input(s): GLUCAP in the last 168 hours. Lipid Profile: No results for input(s): CHOL, HDL, LDLCALC, TRIG, CHOLHDL, LDLDIRECT in the last 72 hours. Thyroid Function Tests: No results for input(s): TSH, T4TOTAL, FREET4, T3FREE, THYROIDAB in the last 72 hours. Anemia Panel: No results for input(s): VITAMINB12, FOLATE, FERRITIN, TIBC, IRON, RETICCTPCT in the last 72 hours. Sepsis Labs: No results for input(s): PROCALCITON, LATICACIDVEN in the last 168 hours.  No results found for this or any previous visit (from the past 240 hour(s)).  Radiology Studies: No results found.  Scheduled Meds:  amantadine  100 mg Oral BID   amLODipine  5 mg Oral Daily   carbamazepine  100 mg Oral TID   cloNIDine  0.2 mg Transdermal Weekly   FLUoxetine  40 mg Oral Daily   haloperidol  2 mg Oral TID   heparin  5,000 Units Subcutaneous Q8H   lamoTRIgine  50 mg Oral BID   levETIRAcetam  500 mg Oral BID   memantine  10 mg Oral QHS   risperiDONE  1 mg Oral Once   Continuous Infusions:   LOS: 25 days    -Patient is independent with ADLs, ambulatory  -- Inpatient psychiatric admission to a geropsychiatric facility continues to be recommended  -Patient is medically stable for discharge to inpatient psychiatric/behavioral unit when bed becomes available  Roxan Hockey, MD [facility]   If 7PM-7AM, please contact night-coverage [email or web address]  07/29/2020, 12:21 PM

## 2020-07-29 NOTE — Progress Notes (Signed)
Pt got up from bed, walked out of room and threw his cup of ice water onto sitter without provocation.  Staff attempted to deescalate situation, but were unsuccessful.  Security arrived on unit and had to forcefully place patient back in the bed.  MD notified that all ordered PRNs had been given without a decrease in agitation, restlessness, or anxiety. Nonviolent restraints ordered for the safety of patient, other patients on the unit (pt likes to wander), and staff.

## 2020-07-29 NOTE — TOC Progression Note (Signed)
Transition of Care [facility]) - Progression Note    Patient Details  Name: Henry Smith MRN: [id number] Date of Birth: 1941-03-22  Transition of Care [facility]) CM/SW Contact  Salome Arnt, [place] Phone Number: 07/29/2020, 12:17 PM  Clinical Narrative:  LCSW reviewed chart. LCSW confirmed with TTS that pt will be seen by psychiatry for medication recommendations per order. TTS sent letter requesting pt be put on priority wait list at [facility]. TOC will continue to follow.     Expected Discharge Plan: Psychiatric Hospital Barriers to Discharge: Psych Bed not available  Expected Discharge Plan and Services Expected Discharge Plan: Psychiatric Hospital In-house Referral: Clinical Social Work     Living arrangements for the past 2 months: [place] Expected Discharge Date: 07/10/20                                     Social Determinants of Health (SDOH) Interventions    Readmission Risk Interventions Readmission Risk Prevention Plan 07/10/2020  Transportation Screening Complete  HRI or [place] Complete  Social Work Consult for [place] Planning/Counseling Complete  Palliative Care Screening Not Applicable  Medication Review [employer]) Complete  Some recent data might be hidden

## 2020-07-29 NOTE — Progress Notes (Signed)
Nurse called due to patient being aggravated and belligerent to the staff.  Patient already obtained Haldol as prescribed and was already oriented without much success.  Security was involved to be able to place patient back in bed.  Nonviolent restraint was placed.

## 2020-07-29 NOTE — Progress Notes (Addendum)
CSW spoke with Amor, admissions RN at [facility]. Pt is not currently on their priority wait list. He requested CSW write a letter explaining why pt needs to be on the priority list, as sending "20 pages of documentation is not helpful". CSW completed the requested letter and faxed the document to [phone].   Disposition will continue to follow.    Audree Camel, LCSW, LCAS Disposition CSW [facility]/TTS [phone]   UPDATE: 215pm  Per admissions staff at [facility], pt is now on the priority wait list.  AP RN notified via secure chat.

## 2020-07-30 MED ORDER — PALIPERIDONE ER 3 MG PO TB24
3.0000 mg | ORAL_TABLET | Freq: Every day | ORAL | Status: DC
  Filled 2020-07-30 (×2): qty 1

## 2020-07-30 MED ORDER — PALIPERIDONE ER 6 MG PO TB24
6.0000 mg | ORAL_TABLET | Freq: Every day | ORAL | Status: AC
  Administered 2020-08-02 – 2020-08-04 (×3): 6 mg via ORAL
  Filled 2020-07-30 (×4): qty 1

## 2020-07-30 MED ORDER — PALIPERIDONE PALMITATE ER 234 MG/1.5ML IM SUSY
234.0000 mg | PREFILLED_SYRINGE | Freq: Once | INTRAMUSCULAR | Status: AC
  Administered 2020-08-05: 234 mg via INTRAMUSCULAR
  Filled 2020-07-30 (×2): qty 1.5

## 2020-07-30 MED ORDER — PALIPERIDONE PALMITATE ER 234 MG/1.5ML IM SUSY
234.0000 mg | PREFILLED_SYRINGE | INTRAMUSCULAR | Status: DC
  Filled 2020-07-30: qty 1.5

## 2020-07-30 MED ORDER — PALIPERIDONE ER 6 MG PO TB24: 6.0000 mg | ORAL_TABLET | Freq: Every day | ORAL | Status: DC

## 2020-07-30 MED ORDER — PALIPERIDONE PALMITATE ER 234 MG/1.5ML IM SUSY: 234.0000 mg | PREFILLED_SYRINGE | Freq: Once | INTRAMUSCULAR | Status: DC

## 2020-07-30 MED ORDER — PALIPERIDONE ER 3 MG PO TB24
3.0000 mg | ORAL_TABLET | Freq: Every day | ORAL | Status: AC
  Administered 2020-07-31: 3 mg via ORAL
  Filled 2020-07-30 (×3): qty 1

## 2020-07-30 MED ORDER — PALIPERIDONE PALMITATE ER 156 MG/ML IM SUSY
156.0000 mg | PREFILLED_SYRINGE | Freq: Once | INTRAMUSCULAR | Status: AC
  Administered 2020-08-13: 156 mg via INTRAMUSCULAR
  Filled 2020-07-30 (×2): qty 1

## 2020-07-30 NOTE — Progress Notes (Signed)
Was called to the room by the NT, after the patient threw milk at the staff and kicked over his bedside table.  Haldol was given and restraints reapplied.  Patient currently resting.  Will continue to monitor.

## 2020-07-30 NOTE — Progress Notes (Signed)
PROGRESS NOTE    Henry Smith  YSA:[id number] DOB: 1941/10/14 DOA: 07/03/2020 PCP: Caprice Renshaw, MD    Brief Narrative:  79 year old male with a history of neurosyphilis, dementia, schizoaffective disorder, COPD, thrombocytopenia, seizure disorder, who is a long-term resident of a nursing facility, was brought to the hospital with progressive changes in mental status.  Patient had been coming increasingly aggressive, was refusing any medications, becoming paranoid.  More recently, staff had noted that he was drinking his urine.  He was evaluated in the emergency room and felt that he may have a urinary tract infection was admitted to the hospital service.  Further review of urine studies indicate that his positive urine culture is likely a colonization and not a true infection.  He did not have any other signs of active infection.  Discussed with infectious disease who agreed with not treating with antibiotics.  It was felt that his progressive decline in mental status was related to his underlying behavioral health.  Seen by psychiatry with recommendations for inpatient psychiatry.  He is involuntarily committed.  Patient is medically stable for discharge whenever he can be placed by psychiatry.  -Patient is independent with ADLs, ambulatory -- -Mood instability, agitation and aggressive behavior persist -Patient is medically stable for discharge to inpatient psychiatric/behavioral unit when bed becomes available  Assessment & Plan:   Principal Problem:   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[place]) Active Problems:   Schizoaffective disorder, bipolar type (HCC)   Thrombocytopenia (HCC)   Seizure ([place])   Noncompliance with medication regimen   Acute metabolic encephalopathy   -In summary this is 79 year old male with past medical history relevant for schizoaffective disorder, bipolar type and history of neurosyphilis with dementia with ongoing episodes of occasional agitation and  psychotic type behavior--- patient continues to have occasional episodes of significant restlessness, becomes uncooperative and verbally aggressive from time to time   1)Progressive Behavioral Changes---  Initially felt to be related to possible UTI, but patient likely has colonization and not a true urinary tract infection.  He has been refusing his medications for weeks to months.  He is on several psychotropic medications such as Depakote, Haldol, Ativan.  He also takes Lamictal, Keppra for seizures.  His behavior changes appear to be chronic progressive in nature and appear to be secondary to his underlying mental illness.  Other work-up including CT imaging of head, basic chemistry, ammonia, TSH, ABG were unrevealing.  Appreciate psychiatry input.  Recommendations are for inpatient psychiatry.  He is currently under involuntary commitment.---- - Inpatient psychiatric admission to a geropsychiatric facility continues to be recommended -Patient is medically stable for discharge to inpatient psychiatric/behavioral unit when bed becomes available  --- Poor compliance with medications and persistent psychotic type behavior disturbance persist --IVC paperwork resubmitted on 07/26/2020 ---  - 07/30/2020 Discussed with Ms Sheran Fava -- recommends  1)paliperidone (INVEGA) 24 hr tablet 3 mg, Daily x 3 days, then  2)paliperidone (INVEGA) 24 hr tablet 6 mg, Daily x 3 days , then on day 7 of Invega Therapy give IM paliperidone (INVEGA) 234 mg x 1, then 8 days after first IM dose give paliperidone (INVEGA) 156 mg IM x 1, then  IM paliperidone (INVEGA) 234 mg every 4 weeks after that --Pt is to remain IVC, okay to have Medical Doctors sign a Forced Medication administration order if needed to Force pt to take oral and IM Medications  After further eval by psych provider-- changes to pt's regimen----  --Depakote was dced bcos it is difficult  to monitor levels (pt is not always cooperative with lab  draws)--- -- Scheduled--c/n Amantadine, Namenda, Haldol, Prozac ,Clonidine patch  Prn-- Im Haldol and im Ativan   Changes:- Stopped Depakote as above Started Tegretol 100 mg Tid  2)HTN--BP is not at goal partly due to poor compliance with medications -  --- c/n  Clonidine 0.2mg   patch once weekly to try to improve compliance -Continue amlodipine 5 mg daily when patient agrees to take it  3)Dementia Associate with Neurosyphilis---  Review of records indicate that he was treated for neurosyphilis in 2018.  He is chronically on Namenda which he has been refusing.  4)Thrombocytopenia/pancytopenia--patient with some degree of mild pancytopenia which is stable--  Appears to be a chronic issue.  5)Seizure disorder---- Chronically on Keppra and Lamictal.   --He has been refusing these medications.  He has IM Ativan as needed ordered if he has any breakthrough seizures.  6)Noncompliance----  Patient has been refusing medications for several weeks to months at this point.    DVT prophylaxis: heparin injection 5,000 Units Start: 07/04/20 0600 SCDs Start: 07/04/20 0130  Code Status: full code Family Communication: No family present Disposition Plan: Status is: Inpatient  Remains inpatient appropriate because:Unsafe d/c plan   Dispo: The patient is from: SNF              Anticipated d/c is to: Inpatient psychiatry              Anticipated d/c date is: 1 day              Patient currently is medically stable to d/c. ---- Inpatient psychiatric admission to a geropsychiatric facility continues to be recommended -Patient is medically stable for discharge to inpatient psychiatric/behavioral unit when bed becomes available  Consultants:   Psychiatry  Procedures:     Antimicrobials:      Subjective:  ---Patient is independent with ADLs, ambulatory -Patient is medically stable for discharge to inpatient psychiatric/behavioral unit when bed becomes available  07/30/20--  -Mood  instability, agitation and aggressive behavior persist  Objective: Vitals:   07/27/20 2041 07/29/20 0544 07/29/20 1249 07/30/20 0620  BP: (!) 146/92 (!) 173/97 (!) 148/86 (!) 188/87  Pulse: 82 66 76 (!) 58  Resp: 20   19  Temp: 97.9 F (36.6 C) 97.8 F (36.6 C) (!) 97.5 F (36.4 C) 98.2 F (36.8 C)  TempSrc: Oral Oral Oral   SpO2: 99% 97% 100% 99%  Weight:      Height:        Intake/Output Summary (Last 24 hours) at 07/30/2020 1422 Last data filed at 07/30/2020 0600 Gross per 24 hour  Intake 1440 ml  Output 1650 ml  Net -210 ml   Filed Weights   07/03/20 1504 07/04/20 0200  Weight: 92.5 kg 97.3 kg    Examination:  General exam: Alert, awake, oriented x 3 Respiratory system: Clear to auscultation. Respiratory effort normal. Cardiovascular system:RRR. No murmurs, rubs, gallops. Gastrointestinal system: Abdomen is nondistended, soft and nontender.  Normal bowel sounds heard. Central nervous system: Alert and oriented. No focal neurological deficits. Extremities: No C/C/E, +pedal pulses Skin: No rashes, lesions or ulcers Psychiatry: Episodes of disorientation and agitation from time to time,  refusing medication, lacks insight into his medical issues  Data Reviewed:   CBC: Recent Labs  Lab 07/28/20 1228  WBC 2.2*  NEUTROABS 1.0*  HGB 11.5*  HCT 37.9*  MCV 92.0  PLT 97*   Basic Metabolic Panel: Recent Labs  Lab 07/28/20 1228  NA 138  K 3.9  CL 107  CO2 22  GLUCOSE 90  BUN 16  CREATININE 0.84  CALCIUM 9.0   GFR: Estimated Creatinine Clearance: 87.6 mL/min (by C-G formula based on SCr of 0.84 mg/dL). Liver Function Tests: Recent Labs  Lab 07/28/20 1228  AST 20  ALT 21  ALKPHOS 57  BILITOT 0.6  PROT 6.4*  ALBUMIN 3.3*   No results for input(s): LIPASE, AMYLASE in the last 168 hours. No results for input(s): AMMONIA in the last 168 hours. Coagulation Profile: No results for input(s): INR, PROTIME in the last 168 hours. Cardiac Enzymes: No  results for input(s): CKTOTAL, CKMB, CKMBINDEX, TROPONINI in the last 168 hours. BNP (last 3 results) No results for input(s): PROBNP in the last 8760 hours. HbA1C: No results for input(s): HGBA1C in the last 72 hours. CBG: No results for input(s): GLUCAP in the last 168 hours. Lipid Profile: No results for input(s): CHOL, HDL, LDLCALC, TRIG, CHOLHDL, LDLDIRECT in the last 72 hours. Thyroid Function Tests: No results for input(s): TSH, T4TOTAL, FREET4, T3FREE, THYROIDAB in the last 72 hours. Anemia Panel: No results for input(s): VITAMINB12, FOLATE, FERRITIN, TIBC, IRON, RETICCTPCT in the last 72 hours. Sepsis Labs: No results for input(s): PROCALCITON, LATICACIDVEN in the last 168 hours.  No results found for this or any previous visit (from the past 240 hour(s)).  Radiology Studies: No results found.  Scheduled Meds: . amantadine  100 mg Oral BID  . amLODipine  5 mg Oral Daily  . carbamazepine  100 mg Oral TID  . cloNIDine  0.2 mg Transdermal Weekly  . FLUoxetine  40 mg Oral Daily  . haloperidol  2 mg Oral TID  . heparin  5,000 Units Subcutaneous Q8H  . lamoTRIgine  50 mg Oral BID  . levETIRAcetam  500 mg Oral BID  . memantine  10 mg Oral QHS  . paliperidone  3 mg Oral Daily   Followed by  . [START ON 08/02/2020] paliperidone  6 mg Oral Daily   Followed by  . [START ON 08/05/2020] paliperidone  234 mg Intramuscular Once   Continuous Infusions:   LOS: 26 days    -Patient is independent with ADLs, ambulatory  -- Inpatient psychiatric admission to a geropsychiatric facility continues to be recommended  -Patient is medically stable for discharge to inpatient psychiatric/behavioral unit when bed becomes available  Roxan Hockey, MD [facility]   If 7PM-7AM, please contact night-coverage [email or web address]  07/30/2020, 2:22 PM

## 2020-07-30 NOTE — Progress Notes (Addendum)
   As per Psychiatrist Dr. Dwyane Dee--- I spoke to [name]. She states to have a 2nd physician do an exam/assessment to determine that patient doesnt have capacity to make medical decisions. That physician is to then complete a 2nd opinion (form located in EPIC). She states that you can do force meds under IVC, however 2nd opinion is preferred. She further states she does NOT recommend [name] for this patient. Psych Team will Re-evaluate this patient on 07/31/20 and do medication adjustment.    07/30/2020 Henry Smith is  a  79 y.o.  who is AAO x 3 has  significant symptoms of delusion/psychosis, he is unablee to understand (without significant language barrier) his current medical diagnosis, patient unable to verbalizes understanding of proposed treatment options including option of no treatment, the consequences/risk versus benefit of each treatment option, alternatives as well as the option of no treatment.   --Based on my evaluation Henry Smith  appears to LACK the Capacity to make decisions and is Unable to give informed consent about his medical care.  Henry Smith  Has a  surrogate decision-maker(state appointed guardian Ms Darrick Meigs [phone]).  Henry Smith  appears to NOT have the capacity to make his own decisions and nor give informed consent regarding his medical care  -- Henry Hockey, MD    Additional 38 minutes care coordination with psychiatric team

## 2020-07-30 NOTE — Progress Notes (Signed)
Patient very agitated and verbally abusive towards staff. Patient threw a cup of water at staff and stated he wanted to "kill everybody". Staff tried to redirect patient, he continued to be verbally abusive and aggressive toward staff. Patient's wrist restraints were reapplied.
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
## 2020-07-31 NOTE — Progress Notes (Signed)
PROGRESS NOTE    Henry Smith  FBP:[id number] DOB: [birth year] DOA: 07/03/2020 PCP: Caprice Renshaw, MD    Brief Narrative:  79 year old male with a history of neurosyphilis, dementia, schizoaffective disorder, COPD, thrombocytopenia, seizure disorder, who is a long-term resident of a nursing facility, was brought to the hospital with progressive changes in mental status.  Patient had been coming increasingly aggressive, was refusing any medications, becoming paranoid.  More recently, staff had noted that he was drinking his urine.  He was evaluated in the emergency room and felt that he may have a urinary tract infection was admitted to the hospital service.  Further review of urine studies indicate that his positive urine culture is likely a colonization and not a true infection.  He did not have any other signs of active infection.  Discussed with infectious disease who agreed with not treating with antibiotics.  It was felt that his progressive decline in mental status was related to his underlying behavioral health.  Seen by psychiatry with recommendations for inpatient psychiatry.  He is involuntarily committed.  Patient is medically stable for discharge whenever he can be placed by psychiatry.  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  He has been refusing his medications for weeks to months.  He is on several psychotropic medications such as Depakote, Haldol, Ativan.  He also takes Lamictal and Keppra for seizures.  His behavior changes appear to be chronic  progressive in nature and appear to be secondary to his underlying mental illness.  Other work-up including CT imaging of head, basic chemistry, ammonia, TSH, ABG were unrevealing.  Appreciate psychiatry input.  Recommendations are for inpatient psychiatry.  He is currently under involuntary commitment.---- - Inpatient psychiatric admission to a geropsychiatric facility continues to be recommended -Patient is medically stable for discharge to inpatient psychiatric/behavioral unit when bed becomes available  --- Poor compliance with medications and persistent psychotic type behavior disturbance persist --IVC paperwork resubmitted on 07/26/2020; will further resubmit as needed. -Continue to follow psychiatry service for medications adjustment and further recommendations  After further eval by psych provider-- changes to pt's regimen----  --Depakote was dced bcos it is difficult to monitor levels (pt is not always cooperative with lab draws)--- --Scheduled--c/n Amantadine, Namenda, Haldol, Prozac , Clonidine patch and Tegretol  Prn-- Im Haldol and im Ativan ordered  2)HTN--BP is not at goal partly due to poor compliance with medications - -will continue oral amlodipine and clonidine patch  3)Dementia Associated with Neurosyphilis---  Review of records indicate that he was treated for neurosyphilis in 2018.   -He is chronically on Namenda which he has been refusing intermittently.  4)Thrombocytopenia/pancytopenia--patient with some degree of mild pancytopenia which is stable--  Appears to be a chronic issue. -No signs of overt bleeding.  5)Seizure disorder---- Chronically on Keppra and Lamictal.   --He has been refusing these medications.  He has IM Ativan as needed ordered if he has any breakthrough seizures.  6)Noncompliance----  Patient continues refusing medications intermittently.  DVT prophylaxis: heparin injection 5,000 Units Start: 07/04/20 0600 SCDs Start: 07/04/20 0130  Code  Status: full code  Family Communication: No family present Disposition Plan: Status is: Inpatient  Remains inpatient appropriate because:Unsafe d/c plan   Dispo: The patient is from: SNF              Anticipated d/c is to: Inpatient psychiatry              Anticipated d/c date is: 1 day              Patient currently is medically stable to d/c. ---- Inpatient psychiatric admission to a geropsychiatric facility continues to be recommended -Patient is medically stable for discharge to inpatient psychiatric/behavioral unit when bed becomes available  Consultants:   Psychiatry  Procedures:   See below for x-ray reports.  Antimicrobials:   None currently (has completed antibiotics for UTI).  Subjective:  ---Patient is independent with ADLs, ambulatory -Patient is medically stable for discharge to inpatient psychiatric/behavioral unit when bed becomes available.  07/30/20--  -Mood instability, agitation and aggressive behavior persist intermittently.  Objective: Vitals:   07/30/20 0620 07/30/20 1300 07/31/20 0700 07/31/20 0931  BP: (!) 188/87  (!) 175/80   Pulse: (!) 58  61   Resp: 19  18   Temp: 98.2 F (36.8 C)     TempSrc:      SpO2: 99% 97% 97% 96%  Weight:      Height:        Intake/Output Summary (Last 24 hours) at 07/31/2020 1512 Last data filed at 07/31/2020 6378 Gross per 24 hour  Intake 340 ml  Output 600 ml  Net -260 ml   Filed Weights   07/03/20 1504 07/04/20 0200  Weight: 92.5 kg 97.3 kg    Examination: General exam: Alert, awake, oriented to person only; demonstrating lack of proper insight to his condition and situation.  No fever, no nausea, no vomiting. Respiratory system: Clear to auscultation. Respiratory effort normal. Cardiovascular system:RRR. No murmurs, rubs, gallops. Gastrointestinal system: Abdomen is nondistended, soft and nontender. No organomegaly or masses felt. Normal bowel sounds heard. Central nervous system: No focal  neurological deficits. Extremities: No cyanosis, clubbing or edema. Skin: No rashes, no petechiae. Psychiatry: Intermittent episodes of disorientation and delusions; agitated and aggressive.  Refusing medications and requiring soft restraints.   Data Reviewed:   CBC: Recent Labs  Lab 07/28/20 1228  WBC 2.2*  NEUTROABS 1.0*  HGB 11.5*  HCT 37.9*  MCV 92.0  PLT 97*   Basic Metabolic Panel: Recent Labs  Lab 07/28/20 1228  NA 138  K 3.9  CL 107  CO2 22  GLUCOSE 90  BUN 16  CREATININE 0.84  CALCIUM 9.0   GFR: Estimated Creatinine Clearance: 87.6 mL/min (by C-G formula based on SCr of 0.84 mg/dL). Liver Function Tests: Recent Labs  Lab 07/28/20 1228  AST 20  ALT 21  ALKPHOS 57  BILITOT 0.6  PROT 6.4*  ALBUMIN 3.3*    No results found for this or any previous visit (from the past 240 hour(s)).  Radiology Studies: No results found.  Scheduled Meds: . amantadine  100 mg Oral BID  . amLODipine  5 mg Oral Daily  . carbamazepine  100 mg Oral TID  . cloNIDine  0.2 mg Transdermal Weekly  . FLUoxetine  40 mg Oral Daily  . haloperidol  2 mg Oral TID  . heparin  5,000 Units Subcutaneous Q8H  . lamoTRIgine  50 mg Oral BID  . levETIRAcetam  500 mg Oral BID  . memantine  10 mg Oral QHS  . [START ON 08/13/2020]  paliperidone  156 mg Intramuscular Once  . paliperidone  3 mg Oral Daily   Followed by  . [START ON 08/02/2020] paliperidone  6 mg Oral Daily   Followed by  . [START ON 08/05/2020] paliperidone  234 mg Intramuscular Once  . [START ON 09/08/2020] paliperidone  234 mg Intramuscular Q28 days   Continuous Infusions:   LOS: 27 days    -Patient is independent with ADLs, ambulatory  -- Inpatient psychiatric admission to a geropsychiatric facility continues to be recommended  -Patient is medically stable for discharge to inpatient psychiatric/behavioral unit when bed becomes available  Barton Dubois, MD [facility]   If 7PM-7AM, please contact  night-coverage [email or web address]  07/31/2020, 3:12 PM

## 2020-08-01 NOTE — Progress Notes (Signed)
CSW spoke with admissions staff at [facility]. Pt remains on their priority waiting list.    Disposition will continue to follow.    Audree Camel, LCSW, [place] Disposition [facility]/TTS [phone]

## 2020-08-01 NOTE — Progress Notes (Signed)
PROGRESS NOTE    Henry Smith  WER:[id number] DOB: 28-Feb-1941 DOA: 07/03/2020 PCP: Caprice Renshaw, MD    Brief Narrative:  79 year old male with a history of neurosyphilis, dementia, schizoaffective disorder, COPD, thrombocytopenia, seizure disorder, who is a long-term resident of a nursing facility, was brought to the hospital with progressive changes in mental status.  Patient had been coming increasingly aggressive, was refusing any medications, becoming paranoid.  More recently, staff had noted that he was drinking his urine.  He was evaluated in the emergency room and felt that he may have a urinary tract infection was admitted to the hospital service.  Further review of urine studies indicate that his positive urine culture is likely a colonization and not a true infection.  He did not have any other signs of active infection.  Discussed with infectious disease who agreed with not treating with antibiotics.  It was felt that his progressive decline in mental status was related to his underlying behavioral health.  Seen by psychiatry with recommendations for inpatient psychiatry.  He is involuntarily committed.  Patient is medically stable for discharge whenever he can be placed by psychiatry.  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  He has been refusing his medications for weeks to months.  He is on several psychotropic medications such as Depakote, Haldol, Ativan.  He also takes Lamictal and Keppra for seizures.  His behavior changes appear to be chronic  progressive in nature and appear to be secondary to his underlying mental illness.  Other work-up including CT imaging of head, basic chemistry, ammonia, TSH, ABG were unrevealing.  Appreciate psychiatry input.  Recommendations are for inpatient psychiatry.  He is currently under involuntary commitment.---- - Inpatient psychiatric admission to a geropsychiatric facility continues to be recommended -Patient is medically stable for discharge to inpatient psychiatric/behavioral unit when bed becomes available  --- Poor compliance with medications and persistent psychotic type behavior disturbance persist --IVC paperwork resubmitted on 07/26/2020; will further resubmit as needed. -Continue to follow psychiatry service for medications adjustment and further recommendations  After further eval by psych provider-- changes to pt's regimen----  --Depakote was dced bcos it is difficult to monitor levels (pt is not always cooperative with lab draws)--- --Scheduled--c/n Amantadine, Namenda, Haldol, Prozac , Clonidine patch and Tegretol  Prn-- Im Haldol and im Ativan ordered  2)HTN--BP is not at goal partly due to poor compliance with medications - -will continue oral amlodipine and clonidine patch  3)Dementia Associated with Neurosyphilis---  Review of records indicate that he was treated for neurosyphilis in 2018.   -He is chronically on Namenda which he has been refusing intermittently.  4)Thrombocytopenia/pancytopenia--patient with some degree of mild pancytopenia which is stable--  Appears to be a chronic issue. -No signs of overt bleeding.  5)Seizure disorder---- Chronically on Keppra and Lamictal.   --He has been refusing these medications.  He has IM Ativan as needed ordered if he has any breakthrough seizures.  6)Noncompliance----  Patient continues refusing medications intermittently.  DVT prophylaxis: heparin injection 5,000 Units Start: 07/04/20 0600 SCDs Start: 07/04/20 0130  Code  Status: full code  Family Communication: No family present Disposition Plan: Status is: Inpatient  Remains inpatient appropriate because:Unsafe d/c plan   Dispo: The patient is from: SNF              Anticipated d/c is to: Inpatient psychiatry              Anticipated d/c date is: 1 day              Patient currently is medically stable to d/c. ---- Inpatient psychiatric admission to a geropsychiatric facility continues to be recommended -Patient is medically stable for discharge to inpatient psychiatric/behavioral unit when bed becomes available.  Patient continued to be first priority on waiting list at [facility].  Consultants:   Psychiatry  Procedures:   See below for x-ray reports.  Antimicrobials:   None currently (has completed antibiotics for UTI).  Subjective: No fever, no chest pain, no nausea, no vomiting.  Remains medically stable for discharge to psych facility when bed available.  Objective: Vitals:   07/31/20 0931 07/31/20 2302 08/01/20 0700 08/01/20 1030  BP:  115/64 (!) 147/96 131/65  Pulse:  89 72 79  Resp:  18 15 16   Temp:  97.9 F (36.6 C) 98.2 F (36.8 C)   TempSrc:  Oral    SpO2: 96% 99%  100%  Weight:      Height:        Intake/Output Summary (Last 24 hours) at 08/01/2020 1204 Last data filed at 08/01/2020 0900 Gross per 24 hour  Intake 480 ml  Output --  Net 480 ml   Filed Weights   07/03/20 1504 07/04/20 0200  Weight: 92.5 kg 97.3 kg    Examination: General exam: Sleepy today; no chest pain, no nausea, no vomiting.  Easily awakened and expressed no acute distress. Respiratory system: Clear to auscultation. Respiratory effort normal. Cardiovascular system:RRR. No murmurs, rubs, gallops. Gastrointestinal system: Abdomen is nondistended, soft and nontender. No organomegaly or masses felt. Normal bowel sounds heard. Central nervous system: Alert and oriented. No focal neurological deficits. Extremities: No cyanosis, clubbing or edema. Skin:  No rashes, no petechiae. Psychiatry: Judgement and insight remains impaired; patient with intermittent episode of delusions, agitated/aggressive behavior as per nursing report.  Calmed and resting currently.     Data Reviewed:   CBC: Recent Labs  Lab 07/28/20 1228  WBC 2.2*  NEUTROABS 1.0*  HGB 11.5*  HCT 37.9*  MCV 92.0  PLT 97*   Basic Metabolic Panel: Recent Labs  Lab 07/28/20 1228  NA 138  K 3.9  CL 107  CO2 22  GLUCOSE 90  BUN 16  CREATININE 0.84  CALCIUM 9.0   GFR: Estimated Creatinine Clearance: 87.6 mL/min (by C-G formula based on SCr of 0.84 mg/dL). Liver Function Tests: Recent Labs  Lab 07/28/20 1228  AST 20  ALT 21  ALKPHOS 57  BILITOT 0.6  PROT 6.4*  ALBUMIN 3.3*   Radiology Studies: No results found.  Scheduled Meds: . amantadine  100 mg Oral BID  . amLODipine  5 mg Oral Daily  . carbamazepine  100 mg Oral TID  . cloNIDine  0.2 mg Transdermal Weekly  . FLUoxetine  40 mg Oral Daily  . haloperidol  2 mg Oral TID  . heparin  5,000 Units Subcutaneous Q8H  . lamoTRIgine  50 mg Oral BID  . levETIRAcetam  500 mg Oral BID  . memantine  10 mg Oral QHS  . [START ON 08/13/2020] paliperidone  156 mg Intramuscular Once  . paliperidone  3 mg Oral Daily   Followed by  . [START ON 08/02/2020] paliperidone  6 mg Oral Daily   Followed by  . [START ON 08/05/2020] paliperidone  234 mg Intramuscular Once  . [START ON 09/08/2020] paliperidone  234 mg Intramuscular Q28 days   Continuous Infusions:   LOS: 28 days    -Patient is independent with ADLs, ambulatory  -- Inpatient psychiatric admission to a geropsychiatric facility continues to be recommended  -Patient is medically stable for discharge to inpatient psychiatric/behavioral unit when bed becomes available  Barton Dubois, MD [facility]   If 7PM-7AM, please contact night-coverage [email or web address]  08/01/2020, 12:04 PM

## 2020-08-01 NOTE — Progress Notes (Signed)
Pt's VS obtained at 1030, WNL. Pt was sleeping was easily awakened, cooperative and asking when lunch would be here. Pt states, "Well, I'm hungry, tell 'em to hurry up!" At 1100, back in to room to give pt his am meds mixed with ice cream. Pt opened eyes with name called but closed them again without further response. Asked pt if he was hungry, he said, "Naw." Pt then began snoring. Will attempt med pass with lunch tray.

## 2020-08-01 NOTE — TOC Progression Note (Signed)
Transition of Care [facility]) - Progression Note   Patient Details  Name: Henry Smith MRN: [id number] Date of Birth: 1941-05-31  [facility] CM/SW [place], LCSW Phone Number: 08/01/2020, 1:32 PM  Clinical Narrative:  IVC paperwork was completed and notarized. Paperwork faxed to [employer] office. CSW confirmed with magistrate's office it has been received.  Expected Discharge Plan: Psychiatric Hospital Barriers to Discharge: Psych Bed not available  Expected Discharge Plan and Services Expected Discharge Plan: Psychiatric Hospital In-house Referral: Clinical Social Work Living arrangements for the past 2 months: [place] Expected Discharge Date: 07/10/20                Readmission Risk Interventions Readmission Risk Prevention Plan 07/10/2020  Transportation Screening Complete  HRI or Home Care Consult Complete  Social Work Consult for [place] Planning/Counseling Complete  Palliative Care Screening Not Applicable  Medication Review [employer]) Complete  Some recent data might be hidden

## 2020-08-01 NOTE — BH Assessment (Signed)
Reassessment Note: Pt presents returning from the bathroom. He told the RN he needs to wash his hands and eventually sat on the edge of the bed to engage in assessment. Pt reports he is doing well. He denies wanting to harm himself and states "& no one else either". Pt then laid back on the bed out of view. He continues to meet criteria for inpt tx.

## 2020-08-02 NOTE — Progress Notes (Signed)
PROGRESS NOTE    Henry Smith  GUR:[id number] DOB: 1941-12-11 DOA: 07/03/2020 PCP: Caprice Renshaw, MD    Brief Narrative:  79 year old male with a history of neurosyphilis, dementia, schizoaffective disorder, COPD, thrombocytopenia, seizure disorder, who is a long-term resident of a nursing facility, was brought to the hospital with progressive changes in mental status.  Patient had been coming increasingly aggressive, was refusing any medications, becoming paranoid.  More recently, staff had noted that he was drinking his urine.  He was evaluated in the emergency room and felt that he may have a urinary tract infection was admitted to the hospital service.  Further review of urine studies indicate that his positive urine culture is likely a colonization and not a true infection.  He did not have any other signs of active infection.  Discussed with infectious disease who agreed with not treating with antibiotics.  It was felt that his progressive decline in mental status was related to his underlying behavioral health.  Seen by psychiatry with recommendations for inpatient psychiatry.  He is involuntarily committed.  Patient is medically stable for discharge whenever he can be placed by psychiatry.  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  He has been refusing his medications for weeks to months.  He is on several psychotropic medications such as Depakote, Haldol, Ativan.  He also takes Lamictal and Keppra for seizures.  His behavior changes appear to be chronic  progressive in nature and appear to be secondary to his underlying mental illness.  Other work-up including CT imaging of head, basic chemistry, ammonia, TSH, ABG were unrevealing.  Appreciate psychiatry input.  Recommendations are for inpatient psychiatry.  He is currently under involuntary commitment.---- - Inpatient psychiatric admission to a geropsychiatric facility continues to be recommended -Patient is medically stable for discharge to inpatient psychiatric/behavioral unit when bed becomes available  --- Poor compliance with medications and persistent psychotic type behavior disturbance persist --IVC paperwork resubmitted on 07/26/2020; will further resubmit as needed. -Continue to follow psychiatry service for medications adjustment and further recommendations  After further eval by psych provider-- changes to pt's regimen----  --Depakote was dced bcos it is difficult to monitor levels (pt is not always cooperative with lab draws)--- --Scheduled--c/n Amantadine, Namenda, Haldol, Prozac , Clonidine patch and Tegretol  Prn-- Im Haldol and im Ativan ordered  2)HTN--BP is not at goal partly due to poor compliance with medications - -will continue oral amlodipine and clonidine patch  3)Dementia Associated with Neurosyphilis---  Review of records indicate that he was treated for neurosyphilis in 2018.   -He is chronically on Namenda which he has been refusing intermittently.  4)Thrombocytopenia/pancytopenia--patient with some degree of mild pancytopenia which is stable--  Appears to be a chronic issue. -No signs of overt bleeding.  5)Seizure disorder---- Chronically on Keppra and Lamictal.   --He has been refusing these medications.  He has IM Ativan as needed ordered if he has any breakthrough seizures.  6)Noncompliance----  Patient continues refusing medications intermittently.  DVT prophylaxis: heparin injection 5,000 Units Start: 07/04/20 0600 SCDs Start: 07/04/20 0130  Code  Status: full code  Family Communication: No family present Disposition Plan: Status is: Inpatient  Remains inpatient appropriate because:Unsafe d/c plan   Dispo: The patient is from: SNF              Anticipated d/c is to: Inpatient psychiatry              Anticipated d/c date is: 1 day              Patient currently is medically stable to d/c. ---- Inpatient psychiatric admission to a geropsychiatric facility continues to be recommended -Patient is medically stable for discharge to inpatient psychiatric/behavioral unit when bed becomes available.  Patient continued to be first priority on waiting list at [facility].  Consultants:   Psychiatry  Procedures:   See below for x-ray reports.  Antimicrobials:   None currently (has completed antibiotics for UTI).  Subjective: No fever, no chest pain, no nausea, no vomiting, no shortness of breath.  Following commands, taking medications and currently denying suicidal ideation.  Objective: Vitals:   08/01/20 0700 08/01/20 1030 08/01/20 2237 08/02/20 0708  BP: (!) 147/96 131/65 (!) 143/77 (!) 157/77  Pulse: 72 79 81 (!) 59  Resp: 15 16 16 15   Temp: 98.2 F (36.8 C)  97.7 F (36.5 C) 98 F (36.7 C)  TempSrc:   Oral Oral  SpO2:  100% 100% 99%  Weight:      Height:        Intake/Output Summary (Last 24 hours) at 08/02/2020 1351 Last data filed at 08/02/2020 1200 Gross per 24 hour  Intake 1440 ml  Output --  Net 1440 ml   Filed Weights   07/03/20 1504 07/04/20 0200  Weight: 92.5 kg 97.3 kg    Examination: General exam: Alert, awake, oriented x 2, following commands, no chest pain, no nausea, no vomiting, no rest of breath.  With adequate behavior and refusing medication at this time. Respiratory system: Clear to auscultation. Respiratory effort normal. Cardiovascular system:RRR. No murmurs, rubs, gallops. Gastrointestinal system: Abdomen is nondistended, soft and nontender. No organomegaly or masses felt. Normal bowel sounds  heard. Central nervous system: Alert and oriented. No focal neurological deficits. Extremities: No cyanosis or clubbing. Skin: No rashes, no petechiae. Psychiatry: Mood & affect appropriate.  Nurses reported some intermittent delusions; currently denying suicidal ideation or hallucination.   Data Reviewed:   CBC: Recent Labs  Lab 07/28/20 1228  WBC 2.2*  NEUTROABS 1.0*  HGB 11.5*  HCT 37.9*  MCV 92.0  PLT 97*   Basic Metabolic Panel: Recent Labs  Lab 07/28/20 1228  NA 138  K 3.9  CL 107  CO2 22  GLUCOSE 90  BUN 16  CREATININE 0.84  CALCIUM 9.0   GFR: Estimated Creatinine Clearance: 87.6 mL/min (by C-G formula based on SCr of 0.84 mg/dL).   Liver Function Tests: Recent Labs  Lab 07/28/20 1228  AST 20  ALT 21  ALKPHOS 57  BILITOT 0.6  PROT 6.4*  ALBUMIN 3.3*   Radiology Studies: No results found.  Scheduled Meds: . amantadine  100 mg Oral BID  . amLODipine  5 mg Oral Daily  . carbamazepine  100 mg Oral TID  . cloNIDine  0.2 mg Transdermal Weekly  . FLUoxetine  40 mg Oral Daily  . haloperidol  2 mg Oral TID  . heparin  5,000 Units Subcutaneous Q8H  . lamoTRIgine  50 mg Oral BID  . levETIRAcetam  500 mg Oral BID  . memantine  10 mg Oral QHS  . [START  ON 08/13/2020] paliperidone  156 mg Intramuscular Once  . paliperidone  6 mg Oral Daily   Followed by  . [START ON 08/05/2020] paliperidone  234 mg Intramuscular Once  . [START ON 09/08/2020] paliperidone  234 mg Intramuscular Q28 days   Continuous Infusions:   LOS: 29 days    -Patient is independent with ADLs, ambulatory  -- Inpatient psychiatric admission to a geropsychiatric facility continues to be recommended  -Patient is medically stable for discharge to inpatient psychiatric/behavioral unit when bed becomes available  Barton Dubois, MD [facility]   If 7PM-7AM, please contact night-coverage [email or web address]  08/02/2020, 1:51 PM

## 2020-08-03 NOTE — Progress Notes (Signed)
Pt refused VS  

## 2020-08-03 NOTE — BH Assessment (Signed)
Teleassessment completed at [facility] 3rd floor.   Pt unable or unwilling to communicate verbally--responded by head nods to questions asked.  Pt denies any SI/HI at time of assessment and denies that he is a danger to self or others.  Pt reports that he is sleeping well and able to eat.   Pt denies agitation--however there is a recent incident where pt was verbally aggressive with hospital staff and was unable to be redirected and threatened to kill them.  Pt continues to meet criteria for inpatient treatment at this time  Christina Hussami, LCSW

## 2020-08-03 NOTE — Progress Notes (Signed)
Pt wandering out in the hallway, yelling at staff accusing staff of taking his phone. Patient isn't redirectable at this time. Continue to stand in hallway and threaten staff that he will kill them. On-call clinician made aware.

## 2020-08-03 NOTE — Progress Notes (Addendum)
Patient not in restraints at this time. Patient resting comfortably with no needs presently.  Patient has not been in restraints for several days. Restraints were stopped on 8/18 and have not been placed since that time. Order discontinued and will continue to monitor patient's behavior.

## 2020-08-03 NOTE — Progress Notes (Signed)
PROGRESS NOTE    Henry Smith  MMN:[id number] DOB: [birth year] DOA: 07/03/2020 PCP: Caprice Renshaw, MD    Brief Narrative:  79 year old male with a history of neurosyphilis, dementia, schizoaffective disorder, COPD, thrombocytopenia, seizure disorder, who is a long-term resident of a nursing facility, was brought to the hospital with progressive changes in mental status.  Patient had been coming increasingly aggressive, was refusing any medications, becoming paranoid.  More recently, staff had noted that he was drinking his urine.  He was evaluated in the emergency room and felt that he may have a urinary tract infection was admitted to the hospital service.  Further review of urine studies indicate that his positive urine culture is likely a colonization and not a true infection.  He did not have any other signs of active infection.  Discussed with infectious disease who agreed with not treating with antibiotics.  It was felt that his progressive decline in mental status was related to his underlying behavioral health.  Seen by psychiatry with recommendations for inpatient psychiatry.  He is involuntarily committed.  Patient is medically stable for discharge whenever he can be placed by psychiatry.  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  He has been refusing his medications for weeks to months.  He is on several psychotropic medications such as Depakote, Haldol, Ativan.  He also takes Lamictal and Keppra for seizures.  His behavior changes appear to be chronic  progressive in nature and appear to be secondary to his underlying mental illness.  Other work-up including CT imaging of head, basic chemistry, ammonia, TSH, ABG were unrevealing.  Appreciate psychiatry input.  Recommendations are for inpatient psychiatry.  He is currently under involuntary commitment.---- - Inpatient psychiatric admission to a geropsychiatric facility continues to be recommended -Patient is medically stable for discharge to inpatient psychiatric/behavioral unit when bed becomes available  --- Poor compliance with medications and persistent psychotic type behavior disturbance persist --IVC paperwork resubmitted on 07/26/2020; will further resubmit as needed. -Continue to follow psychiatry service for medications adjustment and further recommendations  After further eval by psych provider-- changes to pt's regimen----  --Depakote was dced bcos it is difficult to monitor levels (pt is not always cooperative with lab draws)--- --Scheduled--c/n Amantadine, Namenda, Haldol, Prozac , Clonidine patch and Tegretol  Prn-- Im Haldol and im Ativan ordered  2)HTN--BP is not at goal partly due to poor compliance with medications - -will continue oral amlodipine and clonidine patch  3)Dementia Associated with Neurosyphilis---  Review of records indicate that he was treated for neurosyphilis in 2018.   -He is chronically on Namenda which he has been refusing intermittently.  4)Thrombocytopenia/pancytopenia--patient with some degree of mild pancytopenia which is stable--  Appears to be a chronic issue. -No signs of overt bleeding.  5)Seizure disorder---- Chronically on Keppra and Lamictal.   --He has been refusing these medications.  He has IM Ativan as needed ordered if he has any breakthrough seizures.  6)Noncompliance----  Patient continues refusing medications intermittently.  DVT prophylaxis: heparin injection 5,000 Units Start: 07/04/20 0600 SCDs Start: 07/04/20 0130  Code  Status: full code  Family Communication: No family present Disposition Plan: Status is: Inpatient  Remains inpatient appropriate because:Unsafe d/c plan   Dispo: The patient is from: SNF              Anticipated d/c is to: Inpatient psychiatry              Anticipated d/c date is: 1 day              Patient currently is medically stable to d/c. ---- Inpatient psychiatric admission to a geropsychiatric facility continues to be recommended -Patient is medically stable for discharge to inpatient psychiatric/behavioral unit when bed becomes available.  Patient continued to be first priority on waiting list at [facility].  Consultants:   Psychiatry  Procedures:   See below for x-ray reports.  Antimicrobials:   None currently (has completed antibiotics for UTI).  Subjective: Having some difficulties to be redirected overnight; threatening staff and expressing that his phone was stolen.  Currently calm, denies suicidal ideation.  No fever, no chest pain, no nausea, no vomiting, no shortness of breath.  Objective: Vitals:   08/01/20 1030 08/01/20 2237 08/02/20 0708 08/02/20 2252  BP: 131/65 (!) 143/77 (!) 157/77 (!) 122/94  Pulse: 79 81 (!) 59 87  Resp: 16 16 15 16   Temp:  97.7 F (36.5 C) 98 F (36.7 C) 98 F (36.7 C)  TempSrc:  Oral Oral Oral  SpO2: 100% 100% 99% 99%  Weight:      Height:        Intake/Output Summary (Last 24 hours) at 08/03/2020 1220 Last data filed at 08/03/2020 0900 Gross per 24 hour  Intake 1920 ml  Output --  Net 1920 ml   Filed Weights   07/03/20 1504 07/04/20 0200  Weight: 92.5 kg 97.3 kg    Examination: General exam: Alert, awake, oriented x 2; reports no suicidal ideation or hallucinations.  Currently calmed. Respiratory system: Clear to auscultation. Respiratory effort normal. Cardiovascular system:RRR. No murmurs, rubs, gallops. Gastrointestinal system: Abdomen is nondistended, soft and nontender. No organomegaly or masses felt. Normal bowel  sounds heard. Central nervous system: No focal neurological deficits. Extremities: No cyanosis or clubbing. Skin: No rashes, no petechiae.  Psychiatry: Agitated overnight, threatening to kill staff and expressing that his phone was stolen.  Was having some difficulty being redirected per nursing report.   Data Reviewed:   CBC: Recent Labs  Lab 07/28/20 1228  WBC 2.2*  NEUTROABS 1.0*  HGB 11.5*  HCT 37.9*  MCV 92.0  PLT 97*   Basic Metabolic Panel: Recent Labs  Lab 07/28/20 1228  NA 138  K 3.9  CL 107  CO2 22  GLUCOSE 90  BUN 16  CREATININE 0.84  CALCIUM 9.0   GFR: Estimated Creatinine Clearance: 87.6 mL/min (by C-G formula based on SCr of 0.84 mg/dL).   Liver Function Tests: Recent Labs  Lab 07/28/20 1228  AST 20  ALT 21  ALKPHOS 57  BILITOT 0.6  PROT 6.4*  ALBUMIN 3.3*   Radiology Studies: No results found.  Scheduled Meds: . amantadine  100 mg Oral BID  . amLODipine  5 mg Oral Daily  . carbamazepine  100 mg Oral TID  . cloNIDine  0.2 mg Transdermal Weekly  . FLUoxetine  40 mg Oral Daily  . haloperidol  2 mg Oral TID  . heparin  5,000 Units Subcutaneous Q8H  . lamoTRIgine  50 mg Oral BID  . levETIRAcetam  500 mg Oral BID  . memantine  10 mg Oral  QHS  . [START ON 08/13/2020] paliperidone  156 mg Intramuscular Once  . paliperidone  6 mg Oral Daily   Followed by  . [START ON 08/05/2020] paliperidone  234 mg Intramuscular Once  . [START ON 09/08/2020] paliperidone  234 mg Intramuscular Q28 days   Continuous Infusions:   LOS: 30 days    -Patient is independent with ADLs, ambulatory  -- Inpatient psychiatric admission to a geropsychiatric facility continues to be recommended  -Patient is medically stable for discharge to inpatient psychiatric/behavioral unit when bed becomes available  Barton Dubois, MD [facility]   If 7PM-7AM, please contact night-coverage [email or web address]  08/03/2020, 12:20 PM

## 2020-08-03 NOTE — Progress Notes (Signed)
Soft wrist restraints wasn't used. Pt finally went to sleep. No further behaviors at this time.

## 2020-08-04 NOTE — [facility] Counselor (Signed)
Pt remains at [facility] hall.  Pt was drowsy and sleeping when reassessed.  Pt stated that he did not want to answer questions, but he did deny suicidal ideation, homicidal ideation, and hallucination.

## 2020-08-04 NOTE — Progress Notes (Signed)
PROGRESS NOTE    Henry Smith  TMA:[id number] DOB: 06/27/1941 DOA: 07/03/2020 PCP: Caprice Renshaw, MD    Brief Narrative:  79 year old male with a history of neurosyphilis, dementia, schizoaffective disorder, COPD, thrombocytopenia, seizure disorder, who is a long-term resident of a nursing facility, was brought to the hospital with progressive changes in mental status.  Patient had been coming increasingly aggressive, was refusing any medications, becoming paranoid.  More recently, staff had noted that he was drinking his urine.  He was evaluated in the emergency room and felt that he may have a urinary tract infection was admitted to the hospital service.  Further review of urine studies indicate that his positive urine culture is likely a colonization and not a true infection.  He did not have any other signs of active infection.  Discussed with infectious disease who agreed with not treating with antibiotics.  It was felt that his progressive decline in mental status was related to his underlying behavioral health.  Seen by psychiatry with recommendations for inpatient psychiatry.  He is involuntarily committed.  Patient is medically stable for discharge whenever he can be placed by psychiatry.  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  He has been refusing his medications for weeks to months.  He is on several psychotropic medications such as Depakote, Haldol, Ativan.  He also takes Lamictal and Keppra for seizures.  His behavior changes appear to be chronic  progressive in nature and appear to be secondary to his underlying mental illness.  Other work-up including CT imaging of head, basic chemistry, ammonia, TSH, ABG were unrevealing.  Appreciate psychiatry input.  Recommendations are for inpatient psychiatry.  He is currently under involuntary commitment.---- - Inpatient psychiatric admission to a geropsychiatric facility continues to be recommended -Patient is medically stable for discharge to inpatient psychiatric/behavioral unit when bed becomes available  --- Poor compliance with medications and persistent psychotic type behavior disturbance persist --IVC paperwork resubmitted on 07/26/2020; will further resubmit as needed. -Continue to follow psychiatry service for medications adjustment and further recommendations  After further eval by psych provider-- changes to pt's regimen----  --Depakote was dced bcos it is difficult to monitor levels (pt is not always cooperative with lab draws)--- --Scheduled--c/n Amantadine, Namenda, Haldol, Prozac , Clonidine patch and Tegretol  Prn-- Im Haldol and im Ativan ordered  2)HTN--BP is not at goal partly due to poor compliance with medications - -will continue oral amlodipine and clonidine patch  3)Dementia Associated with Neurosyphilis---  Review of records indicate that he was treated for neurosyphilis in 2018.   -He is chronically on Namenda which he has been refusing intermittently.  4)Thrombocytopenia/pancytopenia--patient with some degree of mild pancytopenia which is stable--  Appears to be a chronic issue. -No signs of overt bleeding.  5)Seizure disorder---- Chronically on Keppra and Lamictal.   --He has been refusing these medications.  He has IM Ativan as needed ordered if he has any breakthrough seizures.  6)Noncompliance----  Patient continues refusing medications intermittently.  DVT prophylaxis: heparin injection 5,000 Units Start: 07/04/20 0600 SCDs Start: 07/04/20 0130  Code  Status: full code  Family Communication: No family present Disposition Plan: Status is: Inpatient  Remains inpatient appropriate because:Unsafe d/c plan   Dispo: The patient is from: SNF              Anticipated d/c is to: Inpatient psychiatry              Anticipated d/c date is: 1 day              Patient currently is medically stable to d/c. ---- Inpatient psychiatric admission to a geropsychiatric facility continues to be recommended -Patient is medically stable for discharge to inpatient psychiatric/behavioral unit when bed becomes available.  Patient continued to be first priority on waiting list at [facility]    Of note, since 07/31/2020 no restraints has been required, despite intermittent episodes of agitation and having difficulties being redirected at times, patient has been taking his medication without any major refusal and has follow commands from staff.  Consultants:   Psychiatry  Procedures:   See below for x-ray reports.  Antimicrobials:   None currently (has completed antibiotics for UTI).  Subjective: No chest pain, no nausea, no vomiting, no shortness of breath.  No overnight events.  Stable mood currently.  Denies suicidal ideation.  Objective: Vitals:   08/01/20 2237 08/02/20 0708 08/02/20 2252 08/04/20 1026  BP: (!) 143/77 (!) 157/77 (!) 122/94 125/73  Pulse: 81 (!) 59 87 85  Resp: 16 15 16 16   Temp: 97.7 F (36.5 C) 98 F (36.7 C) 98 F (36.7 C) 98.1 F (36.7 C)  TempSrc: Oral Oral Oral Oral  SpO2: 100% 99% 99% 100%  Weight:      Height:        Intake/Output Summary (Last 24 hours) at 08/04/2020 1246 Last data filed at 08/04/2020 1000 Gross per 24 hour  Intake 1350 ml  Output --  Net 1350 ml   Filed Weights   07/03/20 1504 07/04/20 0200  Weight: 92.5 kg 97.3 kg    Examination: General exam: Alert, awake, oriented x 2, no overnight events; denies suicidal ideation or hallucination.  No chest pain, no nausea, no vomiting, no fever.  Following  commands appropriately take medications. Respiratory system: Clear to auscultation. Respiratory effort normal. Cardiovascular system:RRR. No murmurs, rubs, gallops. Gastrointestinal system: Abdomen is nondistended, soft and nontender. No organomegaly or masses felt. Normal bowel sounds heard. Central nervous system: No focal neurological deficits. Extremities: No C/C/E, +pedal pulses Skin: No rashes, no petechiae Psychiatry: Mood & affect appropriate currently.    Data Reviewed:   CBC: No results for input(s): WBC, NEUTROABS, HGB, HCT, MCV, PLT in the last 168 hours. Basic Metabolic Panel: No results for input(s): NA, K, CL, CO2, GLUCOSE, BUN, CREATININE, CALCIUM, MG, PHOS in the last 168 hours. GFR: Estimated Creatinine Clearance: 87.6 mL/min (by C-G formula based on SCr of 0.84 mg/dL).   Liver Function Tests: No results for input(s): AST, ALT, ALKPHOS, BILITOT, PROT, ALBUMIN in the last 168 hours. Radiology Studies: No results found.  Scheduled Meds: . amantadine  100 mg Oral BID  . amLODipine  5 mg Oral Daily  . carbamazepine  100 mg Oral TID  . cloNIDine  0.2 mg Transdermal Weekly  . FLUoxetine  40 mg Oral Daily  . haloperidol  2 mg Oral TID  . heparin  5,000 Units Subcutaneous Q8H  . lamoTRIgine  50 mg Oral BID  . levETIRAcetam  500 mg Oral BID  . memantine  10 mg Oral QHS  . [START ON 08/13/2020] paliperidone  156 mg Intramuscular Once  . paliperidone  6 mg Oral Daily   Followed by  . [START ON 08/05/2020] paliperidone  234 mg Intramuscular Once  . [START ON 09/08/2020] paliperidone  234 mg Intramuscular Q28 days   Continuous Infusions:   LOS: 31 days    -Patient is independent with ADLs, ambulatory  -- Inpatient psychiatric admission to a geropsychiatric facility continues to be recommended  -Patient is medically stable for discharge to inpatient psychiatric/behavioral unit when bed becomes available  Barton Dubois, MD [facility]   If 7PM-7AM, please  contact night-coverage [email or web address]  08/04/2020, 12:46 PM

## 2020-08-05 NOTE — Progress Notes (Signed)
Patient refused vital signs this am.  Patient not placed in restraints during this shift.

## 2020-08-05 NOTE — Progress Notes (Signed)
PROGRESS NOTE    Henry Smith  TKP:[id number] DOB: Apr 23, 1941 DOA: 07/03/2020 PCP: Caprice Renshaw, MD    Brief Narrative:  79 year old male with a history of neurosyphilis, dementia, schizoaffective disorder, COPD, thrombocytopenia, seizure disorder, who is a long-term resident of a nursing facility, was brought to the hospital with progressive changes in mental status.  Patient had been coming increasingly aggressive, was refusing any medications, becoming paranoid.  More recently, staff had noted that he was drinking his urine.  He was evaluated in the emergency room and felt that he may have a urinary tract infection was admitted to the hospital service.  Further review of urine studies indicate that his positive urine culture is likely a colonization and not a true infection.  He did not have any other signs of active infection.  Discussed with infectious disease who agreed with not treating with antibiotics.  It was felt that his progressive decline in mental status was related to his underlying behavioral health.  Seen by psychiatry with recommendations for inpatient psychiatry.  He is involuntarily committed.  Patient is medically stable for discharge whenever he can be placed by psychiatry.  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  He has been refusing his medications for weeks to months.  He is on several psychotropic medications such as Depakote, Haldol, Ativan.  He also takes Lamictal and Keppra for seizures.  His behavior changes appear to be chronic  progressive in nature and appear to be secondary to his underlying mental illness.  Other work-up including CT imaging of head, basic chemistry, ammonia, TSH, ABG were unrevealing.  Appreciate psychiatry input.  Recommendations are for inpatient psychiatry.  He is currently under involuntary commitment.---- - Inpatient psychiatric admission to a geropsychiatric facility continues to be recommended -Patient is medically stable for discharge to inpatient psychiatric/behavioral unit when bed becomes available  --- Poor compliance with medications and persistent psychotic type behavior disturbance persist --IVC paperwork resubmitted on 07/26/2020; will further resubmit as needed. -Continue to follow psychiatry service for medications adjustment and further recommendations  After further eval by psych provider-- changes to pt's regimen----  --Depakote was dced bcos it is difficult to monitor levels (pt is not always cooperative with lab draws)--- --Scheduled--c/n Amantadine, Namenda, Haldol, Prozac , Clonidine patch and Tegretol  Prn-- Im Haldol and im Ativan ordered  2)HTN--BP is not at goal partly due to poor compliance with medications - -will continue oral amlodipine and clonidine patch  3)Dementia Associated with Neurosyphilis---  Review of records indicate that he was treated for neurosyphilis in 2018.   -He is chronically on Namenda which he has been refusing intermittently.  4)Thrombocytopenia/pancytopenia--patient with some degree of mild pancytopenia which is stable--  Appears to be a chronic issue. -No signs of overt bleeding.  5)Seizure disorder---- Chronically on Keppra and Lamictal.   --He has been refusing these medications.  He has IM Ativan as needed ordered if he has any breakthrough seizures.  6)Noncompliance----  Patient continues refusing medications intermittently.  DVT prophylaxis: heparin injection 5,000 Units Start: 07/04/20 0600 SCDs Start: 07/04/20 0130  Code  Status: full code  Family Communication: No family present Disposition Plan: Status is: Inpatient  Remains inpatient appropriate because:Unsafe d/c plan   Dispo: The patient is from: SNF              Anticipated d/c is to: Inpatient psychiatry              Anticipated d/c date is: 1 day              Patient currently is medically stable to d/c. ---- Inpatient psychiatric admission to a geropsychiatric facility continues to be recommended -Patient is medically stable for discharge to inpatient psychiatric/behavioral unit when bed becomes available.  Patient continued to be first priority on waiting list at [facility].    Of note, since 07/31/2020 no restraints has been required, despite intermittent episodes of agitation and having difficulties being redirected at times, patient has been taking his medication without any major refusal and has follow commands from staff.  Consultants:   Psychiatry  Procedures:   See below for x-ray reports.  Antimicrobials:   None currently (has completed antibiotics for UTI).  Subjective: No chest pain, no nausea, no vomiting, no shortness of breath.  Currently home, following commands and denied suicidal ideation.  Overnight difficulties to be redirect, verbally abusive to staff and having agitation.  Objective: Vitals:   08/02/20 0708 08/02/20 2252 08/04/20 1026 08/04/20 1906  BP: (!) 157/77 (!) 122/94 125/73 127/73  Pulse: (!) 59 87 85 85  Resp: 15 16 16 20   Temp: 98 F (36.7 C) 98 F (36.7 C) 98.1 F (36.7 C) 99.1 F (37.3 C)  TempSrc: Oral Oral Oral Oral  SpO2: 99% 99% 100% 99%  Weight:      Height:        Intake/Output Summary (Last 24 hours) at 08/05/2020 1400 Last data filed at 08/04/2020 2300 Gross per 24 hour  Intake 720 ml  Output --  Net 720 ml   Filed Weights   07/03/20 1504 07/04/20 0200  Weight: 92.5 kg 97.3 kg    Examination: General exam: Alert, awake, oriented x 2, overnight experiencing agitation and difficulty to  be redirected.  He was verbally abusive with staff.  No restraints required.  This morning following commands appropriately, no distress, no acute complaints. Respiratory system: Clear to auscultation. Respiratory effort normal. Cardiovascular system:RRR. No murmurs, rubs, gallops. Gastrointestinal system: Abdomen is nondistended, soft and nontender. No organomegaly or masses felt. Normal bowel sounds heard. Central nervous system: Alert and oriented. No focal neurological deficits. Extremities: No C/C/E, +pedal pulses Skin: No rashes, no petechiae. Psychiatry: Mood & affect appropriate currently.  Denies suicidal ideation  Radiology Studies: No results found.  Scheduled Meds: . amantadine  100 mg Oral BID  . amLODipine  5 mg Oral Daily  . carbamazepine  100 mg Oral TID  . cloNIDine  0.2 mg Transdermal Weekly  . FLUoxetine  40 mg Oral Daily  . haloperidol  2 mg Oral TID  . heparin  5,000 Units Subcutaneous Q8H  . lamoTRIgine  50 mg Oral BID  . levETIRAcetam  500 mg Oral BID  . memantine  10 mg Oral QHS  . [START ON 08/13/2020] paliperidone  156 mg Intramuscular Once  . paliperidone  234 mg Intramuscular Once  . [START ON 09/08/2020] paliperidone  234 mg Intramuscular Q28 days   Continuous Infusions:   LOS: 32 days    -Patient is independent with ADLs, ambulatory  -- Inpatient psychiatric admission to a geropsychiatric facility continues to be recommended  -Patient  is medically stable for discharge to inpatient psychiatric/behavioral unit when bed becomes available  Barton Dubois, MD [facility]   If 7PM-7AM, please contact night-coverage [email or web address]  08/05/2020, 2:00 PM

## 2020-08-05 NOTE — Progress Notes (Signed)
Patient asleep at this time, not in restraints. Safety sitter with patient. Will continue to monitor.

## 2020-08-05 NOTE — Progress Notes (Signed)
Patient is agitated and non-redirectable.  Patient is extremely agitated,verbally aggressive, and makes aggressive body movements towards staff.  Patient is also speaking in religious tangents. Patient believes that staff are "demons" and that angels are surrounding him. Patient believes staff if gonna to "stab him" and is threatening staff with call light.  Security at bedside, as well as Actuary.

## 2020-08-06 LAB — GLUCOSE, CAPILLARY: Glucose-Capillary: 130 mg/dL — ABNORMAL HIGH (ref 70–99)

## 2020-08-06 NOTE — Progress Notes (Signed)
PROGRESS NOTE    Antionio Negron  BJS:[id number] DOB: 12-29-1940 DOA: 07/03/2020 PCP: Caprice Renshaw, MD    Brief Narrative:  79 year old male with a history of neurosyphilis, dementia, schizoaffective disorder, COPD, thrombocytopenia, seizure disorder, who is a long-term resident of a nursing facility, was brought to the hospital with progressive changes in mental status.  Patient had been coming increasingly aggressive, was refusing any medications, becoming paranoid.  More recently, staff had noted that he was drinking his urine.  He was evaluated in the emergency room and felt that he may have a urinary tract infection was admitted to the hospital service.  Further review of urine studies indicate that his positive urine culture is likely a colonization and not a true infection.  He did not have any other signs of active infection.  Discussed with infectious disease who agreed with not treating with antibiotics.  It was felt that his progressive decline in mental status was related to his underlying behavioral health.  Seen by psychiatry with recommendations for inpatient psychiatry.  He is involuntarily committed.  Patient is medically stable for discharge whenever he can be placed by psychiatry.  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  He has been refusing his medications for weeks to months.  He is on several psychotropic medications such as Depakote, Haldol, Ativan.  He also takes Lamictal and Keppra for seizures.  His behavior changes appear to be chronic  progressive in nature and appear to be secondary to his underlying mental illness.  Other work-up including CT imaging of head, basic chemistry, ammonia, TSH, ABG were unrevealing.  Appreciate psychiatry input.  Recommendations are for inpatient psychiatry.  He is currently under involuntary commitment.---- - Inpatient psychiatric admission to a geropsychiatric facility continues to be recommended -Patient is medically stable for discharge to inpatient psychiatric/behavioral unit when bed becomes available  --- Poor compliance with medications and persistent psychotic type behavior disturbance persist --IVC paperwork resubmitted on 08/02/2020; will further resubmit as needed. -Continue to follow psychiatry service for medications adjustment and further recommendations  After further eval by psych provider-- changes to pt's regimen----  --Depakote was dced bcos it is difficult to monitor levels (pt is not always cooperative with lab draws)--- --Scheduled--c/n Amantadine, Namenda, Haldol, Prozac , Clonidine patch and Tegretol  Prn-- Im Haldol and im Ativan ordered  2)HTN--BP is not at goal partly due to poor compliance with medications - -will continue oral amlodipine and clonidine patch  3)Dementia Associated with Neurosyphilis---  Review of records indicate that he was treated for neurosyphilis in 2018.   -He is chronically on Namenda which he has been refusing intermittently.  4)Thrombocytopenia/pancytopenia--patient with some degree of mild pancytopenia which is stable--  Appears to be a chronic issue. -No signs of overt bleeding.  5)Seizure disorder---- Chronically on Keppra and Lamictal.   --He has been refusing these medications.  He has IM Ativan as needed ordered if he has any breakthrough seizures.  6)Noncompliance----  Patient continues refusing medications intermittently.  DVT prophylaxis: heparin injection 5,000 Units Start: 07/04/20 0600 SCDs Start: 07/04/20 0130  Code  Status: full code  Family Communication: No family present Disposition Plan: Status is: Inpatient  Remains inpatient appropriate because:Unsafe d/c plan   Dispo: The patient is from: SNF              Anticipated d/c is to: Inpatient psychiatry              Anticipated d/c date is: 1 day              Patient currently is medically stable to d/c. ---- Inpatient psychiatric admission to a geropsychiatric facility continues to be recommended -Patient is medically stable for discharge to inpatient psychiatric/behavioral unit when bed becomes available.  Patient continued to be first priority on waiting list at [facility].    Of note, since 07/31/2020 patient has remained restraints free, despite intermittent episodes of agitation and having difficulties being redirected at times, patient has been taking his medication without any major refusal and has follow commands from staff.  Consultants:   Psychiatry  Procedures:   See below for x-ray reports.  Antimicrobials:   None currently (has completed antibiotics for UTI).  Subjective: Currently calm and in no distress.  Denies chest pain, nausea, vomiting, dysuria, fever/chills or any other complaints.   Objective: Vitals:   08/02/20 2252 08/04/20 1026 08/04/20 1906 08/06/20 0610  BP: (!) 122/94 125/73 127/73 (!) 133/54  Pulse: 87 85 85 70  Resp: 16 16 20 18   Temp: 98 F (36.7 C) 98.1 F (36.7 C) 99.1 F (37.3 C) 98.3 F (36.8 C)  TempSrc: Oral Oral Oral Oral  SpO2: 99% 100% 99% 97%  Weight:      Height:        Intake/Output Summary (Last 24 hours) at 08/06/2020 0754 Last data filed at 08/06/2020 0631 Gross per 24 hour  Intake 2600 ml  Output 500 ml  Net 2100 ml   Filed Weights   07/03/20 1504 07/04/20 0200  Weight: 92.5 kg 97.3 kg    Examination: General exam: Alert, awake, oriented x 2, no suicidal ideation currently.  No overnight events reported.  Patient is calm and in no distress.  Denies chest pain, shortness of  breath, nausea, vomiting, abdominal pain or any other complaints. Respiratory system: Clear to auscultation. Respiratory effort normal. Cardiovascular system:RRR. No murmurs, rubs, gallops. Gastrointestinal system: Abdomen is nondistended, soft and nontender. No organomegaly or masses felt. Normal bowel sounds heard. Central nervous system: Alert and oriented. No focal neurological deficits. Extremities: No C/C/E, +pedal pulses Skin: No rashes, lesions or ulcers Psychiatry:  Mood & affect appropriate currently.    Radiology Studies: No results found.  Scheduled Meds: . amantadine  100 mg Oral BID  . amLODipine  5 mg Oral Daily  . carbamazepine  100 mg Oral TID  . cloNIDine  0.2 mg Transdermal Weekly  . FLUoxetine  40 mg Oral Daily  . haloperidol  2 mg Oral TID  . heparin  5,000 Units Subcutaneous Q8H  . lamoTRIgine  50 mg Oral BID  . levETIRAcetam  500 mg Oral BID  . memantine  10 mg Oral QHS  . [START ON 08/13/2020] paliperidone  156 mg Intramuscular Once  . [START ON 09/08/2020] paliperidone  234 mg Intramuscular Q28 days   Continuous Infusions:   LOS: 33 days    -Patient is independent with ADLs, ambulatory  -- Inpatient psychiatric admission to a geropsychiatric facility continues to be recommended  -Patient is medically stable for discharge to inpatient psychiatric/behavioral unit when bed becomes available  Barton Dubois, MD [facility]  If 7PM-7AM, please contact night-coverage [email or web address]  08/06/2020, 7:54 AM

## 2020-08-06 NOTE — Progress Notes (Addendum)
Patient awake.  Patient walked out of room into hallway and could not be redirected into room.  Patient continued to walk down the hallway into stairway near room 324.  Patient began throwing water at staff, spitting and swatting at staff as he was going down the stairs.   Security had previously been up twice within the last twenty minutes to help reorient patient and keep in room. Patient had been given scheduled medications. Security met patient on ground floor and escorted patient back into room. Patient not compliant in safety plan and is elopement risk at this time.  Patient is IVC patient. Soft wrist restraints applied at this time.

## 2020-08-06 NOTE — Progress Notes (Signed)
Reassessment: Patient seen via telepsych. Chart reviewed. Henry Smith is a 79 year old male with a history of neurosyphilis, dementia, schizoaffective disorder, COPD, thrombocytopenia, seizure disorder, who is a long-term resident of a nursing facility. Patient presented to [place] following changes in mental status andaggressive behavior and has had behavioral outbursts with aggressive behaviors while hospitalized.  Patient seen lying in bed calmly. He presents with constricted affect and will not speak with this provider. He nods and shakes his head for some questions, shaking head no when asked about SI/HI/AVH. He shakes his head yes when asked if he is sleeping well and eating well. He received Invega Sustenna 234 mg yesterday. He has been medication compliant for the past several days. Nursing reports he has been poorly cooperative at times with being cleaned up but no aggressive behaviors. Nursing staff has been administering his medications in a milkshake. Per notes it appears last aggressive behavior was 08/02/20.  Disposition: Patient's mood and behaviors appear more appropriate with current medication regimen, and patient has been med compliant for several days. Will continue current medications. Patient remains on [facility] wait list. ED RN updated.

## 2020-08-07 LAB — BASIC METABOLIC PANEL
Anion gap: 7 (ref 5–15)
BUN: 19 mg/dL (ref 8–23)
CO2: 28 mmol/L (ref 22–32)
Calcium: 9.6 mg/dL (ref 8.9–10.3)
Chloride: 104 mmol/L (ref 98–111)
Creatinine, Ser: 0.84 mg/dL (ref 0.61–1.24)
GFR calc Af Amer: >60 mL/min (ref >60)
GFR calc non Af Amer: >60 mL/min (ref >60)
Glucose, Bld: 118 mg/dL — ABNORMAL HIGH (ref 70–99)
Potassium: 3.5 mmol/L (ref 3.5–5.1)
Sodium: 139 mmol/L (ref 135–145)

## 2020-08-07 LAB — CBC
HCT: 37.3 % — ABNORMAL LOW (ref 39.0–52.0)
Hemoglobin: 11.5 g/dL — ABNORMAL LOW (ref 13.0–17.0)
MCH: 27.6 pg (ref 26.0–34.0)
MCHC: 30.8 g/dL (ref 30.0–36.0)
MCV: 89.4 fL (ref 80.0–100.0)
Platelets: 123 10*3/uL — ABNORMAL LOW (ref 150–400)
RBC: 4.17 MIL/uL — ABNORMAL LOW (ref 4.22–5.81)
RDW: 14.5 % (ref 11.5–15.5)
WBC: 2.3 10*3/uL — ABNORMAL LOW (ref 4.0–10.5)
nRBC: 0 % (ref 0.0–0.2)

## 2020-08-07 MED ORDER — CARBAMAZEPINE 100 MG PO CHEW
200.0000 mg | CHEWABLE_TABLET | Freq: Two times a day (BID) | ORAL | Status: DC
  Administered 2020-08-08 – 2020-09-10 (×44): 200 mg via ORAL
  Filled 2020-08-07 (×48): qty 2

## 2020-08-07 MED ORDER — CARBAMAZEPINE 100 MG PO CHEW
200.0000 mg | CHEWABLE_TABLET | Freq: Once | ORAL | Status: AC
  Administered 2020-08-07: 200 mg via ORAL
  Filled 2020-08-07: qty 2

## 2020-08-07 MED ORDER — CARBAMAZEPINE 100 MG PO CHEW
100.0000 mg | CHEWABLE_TABLET | Freq: Every day | ORAL | Status: DC
  Administered 2020-08-08 – 2020-09-10 (×23): 100 mg via ORAL
  Filled 2020-08-07 (×23): qty 1

## 2020-08-07 NOTE — Progress Notes (Signed)
PROGRESS NOTE  Henry Smith LAG:[id number] DOB: [birth year] DOA: 07/03/2020 PCP: Caprice Renshaw, MD  Brief History:  79 year old male with a history of neurosyphilis, dementia, schizoaffective disorder, COPD, thrombocytopenia, seizure disorder, who is a long-term resident of a nursing facility, was brought to the hospital with progressive changes in mental status.  Patient had been coming increasingly aggressive, was refusing any medications, becoming paranoid.  More recently, staff had noted that he was drinking his urine.  He was evaluated in the emergency room and felt that he may have a urinary tract infection was admitted to the hospital service.  Further review of urine studies indicate that his positive urine culture is likely a colonization and not a true infection.  He did not have any other signs of active infection.  Discussed with infectious disease who agreed with not treating with antibiotics.  It was felt that his progressive decline in mental status was related to his underlying behavioral health.  Seen by psychiatry with recommendations for inpatient psychiatry.  He is involuntarily committed.  Patient is medically stable for discharge whenever he can be placed by psychiatry.  -Patient is independent with ADLs, ambulatory  Assessment/Plan:  1)Progressive Behavioral Changes---  Initially felt to be related to possible UTI, but patient likely has colonization and not a true urinary tract infection.  He has been refusing his medications for weeks to months.  He is on several psychotropic medications such as Depakote, Haldol, Ativan.  He also takes Lamictal and Keppra for seizures.  His behavior changes appear to be chronic progressive in nature and appear to be secondary to his underlying mental illness.  Other work-up including CT imaging of head, basic chemistry, ammonia, TSH, ABG were unrevealing.  Appreciate psychiatry input.  Recommendations are for inpatient psychiatry.   He is currently under involuntary commitment.---- -Inpatient psychiatric admission to a geropsychiatricfacility continues to be recommended -Patient is medically stable for discharge to inpatient psychiatric/behavioral unit when bed becomes available  --- Poor compliance with medications and persistent psychotic type behavior disturbance persist --IVC paperwork resubmitted on 08/07/2020; will further resubmit every 7 days -Continue to follow psychiatry service for medications adjustment and further recommendations- -08/06/20 evening--another episode agression-->increase carbamezapine  After further eval by psych provider-- changes to pt's regimen----  --Depakote was dced bcos it is difficult to monitor levels (pt is not always cooperative with lab draws)--- --Scheduled--c/n Amantadine, Namenda, Haldol, Prozac , Clonidine patch and Tegretol  Prn-- Im Haldol and im Ativan ordered for agitation  2)HTN--BP is not at goal partly due to poor compliance with medications - -will continue oral amlodipine and clonidine patch -BP acceptable  3)Dementia Associated with Neurosyphilis---  Review of records indicate that he was treated for neurosyphilis in 2018.   -He is chronically on Namenda which he has been refusing intermittently.  4)Thrombocytopenia/pancytopenia--patient with some degree of mild pancytopenia which is stable--  Appears to be a chronic issue. -No signs of overt bleeding.  5)Seizure disorder---- Chronically on Keppra and Lamictal.   --He has been refusing these medications intermittently.  He has IM Ativan as needed ordered if he has any breakthrough seizures.  6)Noncompliance----  Patient continues refusing medications intermittently.      Status is: Inpatient  Remains inpatient appropriate because:Unsafe d/c plan   Dispo: The patient is from: SNF              Anticipated d/c is to: Geriatric Psych facility  Anticipated d/c date is: > 3 days               Patient currently is medically stable to d/c.   Of note, since 07/31/2020 patient has remained restraints free, despite intermittent episodes of agitation and having difficulties being redirected at times, patient has been taking his medication without any major refusal and has follow commands from staff.     Family Communication:  no Family at bedside  Consultants:  psychiatry  Code Status:  FULL   DVT Prophylaxis:  heparin injection 5,000 Units Start: 07/04/20 0600 SCDs Start: 07/04/20 0130 now he is ambulatory   Procedures: As Listed in Progress Note Above  Antibiotics: None      Subjective: Patient denies fevers, chills, headache, chest pain, dyspnea, nausea, vomiting, diarrhea, abdominal pain, dysuria  Objective: Vitals:   08/04/20 1906 08/06/20 0610 08/06/20 1500 08/06/20 2345  BP: 127/73 (!) 133/54 140/84 139/74  Pulse: 85 70 93 92  Resp: 20 18 18 18   Temp: 99.1 F (37.3 C) 98.3 F (36.8 C) 97.8 F (36.6 C) 99 F (37.2 C)  TempSrc: Oral Oral Oral Axillary  SpO2: 99% 97% 99% 98%  Weight:      Height:        Intake/Output Summary (Last 24 hours) at 08/07/2020 1705 Last data filed at 08/07/2020 0949 Gross per 24 hour  Intake 960 ml  Output 500 ml  Net 460 ml   Weight change:  Exam:   General:  Pt is alert, follows commands appropriately, not in acute distress  HEENT: No icterus, No thrush, No neck mass, [place]/AT  Cardiovascular: RRR, S1/S2, no rubs, no gallops  Respiratory: CTA bilaterally, no wheezing, no crackles, no rhonchi  Abdomen: Soft/+BS, non tender, non distended, no guarding  Extremities: No edema, No lymphangitis, No petechiae, No rashes, no synovitis   Data Reviewed: I have personally reviewed following labs and imaging studies Basic Metabolic Panel: Recent Labs  Lab 08/07/20 0623  NA 139  K 3.5  CL 104  CO2 28  GLUCOSE 118*  BUN 19  CREATININE 0.84  CALCIUM 9.6   Liver Function Tests: No results for input(s):  AST, ALT, ALKPHOS, BILITOT, PROT, ALBUMIN in the last 168 hours. No results for input(s): LIPASE, AMYLASE in the last 168 hours. No results for input(s): AMMONIA in the last 168 hours. Coagulation Profile: No results for input(s): INR, PROTIME in the last 168 hours. CBC: Recent Labs  Lab 08/07/20 0623  WBC 2.3*  HGB 11.5*  HCT 37.3*  MCV 89.4  PLT 123*   Cardiac Enzymes: No results for input(s): CKTOTAL, CKMB, CKMBINDEX, TROPONINI in the last 168 hours. BNP: Invalid input(s): POCBNP CBG: Recent Labs  Lab 08/06/20 2343  GLUCAP 130*   HbA1C: No results for input(s): HGBA1C in the last 72 hours. Urine analysis:    Component Value Date/Time   COLORURINE YELLOW 07/03/2020 1510   APPEARANCEUR HAZY (A) 07/03/2020 1510   LABSPEC 1.018 07/03/2020 1510   PHURINE 7.0 07/03/2020 1510   GLUCOSEU NEGATIVE 07/03/2020 1510   HGBUR NEGATIVE 07/03/2020 1510   BILIRUBINUR NEGATIVE 07/03/2020 1510   KETONESUR NEGATIVE 07/03/2020 1510   PROTEINUR NEGATIVE 07/03/2020 1510   NITRITE POSITIVE (A) 07/03/2020 1510   LEUKOCYTESUR NEGATIVE 07/03/2020 1510   Sepsis Labs: @LABRCNTIP (procalcitonin:4,lacticidven:4) )No results found for this or any previous visit (from the past 240 hour(s)).   Scheduled Meds:  amantadine  100 mg Oral BID   amLODipine  5 mg Oral Daily   [START ON  08/08/2020] carbamazepine  100 mg Oral QPC breakfast   carbamazepine  200 mg Oral BID   carbamazepine  200 mg Oral Once   cloNIDine  0.2 mg Transdermal Weekly   FLUoxetine  40 mg Oral Daily   haloperidol  2 mg Oral TID   heparin  5,000 Units Subcutaneous Q8H   lamoTRIgine  50 mg Oral BID   levETIRAcetam  500 mg Oral BID   memantine  10 mg Oral QHS   [START ON 08/13/2020] paliperidone  156 mg Intramuscular Once   [START ON 09/08/2020] paliperidone  234 mg Intramuscular Q28 days   Continuous Infusions:  Procedures/Studies: No results found.  Orson Eva, DO  [facility]  If 7PM-7AM, please  contact night-coverage [email or web address] Password [email or web address] 08/07/2020, 5:05 PM   LOS: 34 days

## 2020-08-07 NOTE — Progress Notes (Signed)
Patients refused to take medications and threw them in the garbage.

## 2020-08-07 NOTE — Progress Notes (Signed)
CSW faxed recent provider and RN notes documenting aggression to [facility].   Audree Camel, LCSW, [place] Disposition [place] [facility] BHH/TTS [phone]

## 2020-08-07 NOTE — Progress Notes (Addendum)
CSW spoke with admissions staff at [facility]. Pt remains on their priority waiting list.   Referral documentation has been re-faxed to the following hospitals for review: [facility]  [facility]  [facility]  [facility]  [facility]  [facility]  [facility]     CSW has also left a voice message requesting a return phone call to [facility] in [place], [place], where pt was residing prior to being brought to the ED.    Audree Camel, LCSW, [place] Disposition [facility] B[facility]/TTS [phone]
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
## 2020-08-07 NOTE — TOC Progression Note (Signed)
Transition of Care [facility]) - Progression Note    Patient Details  Name: Lleyton Byers MRN: [id number] Date of Birth: 1941-05-28  Transition of Care [facility]) CM/SW Contact  Natasha Bence, LCSW Phone Number: 08/07/2020, 2:51 PM  Clinical Narrative:    CSW completed IVC paperwork for patient and obtained neccessary signatures. CSW then faxed the IVC paperwork to the magistrate's office and confirmed that they have received it. CSW provided original paperwork to [address] to be filed for patient. TOC to follow.   Expected Discharge Plan: Psychiatric Hospital Barriers to Discharge: Psych Bed not available  Expected Discharge Plan and Services Expected Discharge Plan: Psychiatric Hospital In-house Referral: Clinical Social Work     Living arrangements for the past 2 months: [place] Expected Discharge Date: 07/10/20                                     Social Determinants of Health (SDOH) Interventions    Readmission Risk Interventions Readmission Risk Prevention Plan 07/10/2020  Transportation Screening Complete  HRI or [place] Complete  Social Work Consult for [place] Planning/Counseling Complete  Palliative Care Screening Not Applicable  Medication Review [employer]) Complete  Some recent data might be hidden

## 2020-08-08 NOTE — Progress Notes (Signed)
Patient ambulated to bathroom, bed changed, and patient removed from restraints due to compliance with staff directions. Patient  Verbalizes understanding that patient can't demonstrate aggressive behavior toward staff. Patient is resting quietly in bed.

## 2020-08-08 NOTE — Progress Notes (Signed)
PROGRESS NOTE  Heather Streeper IFO:[id number] DOB: [birth year] DOA: 07/03/2020 PCP: Caprice Renshaw, MD Brief History:  79 year old male with a history of neurosyphilis, dementia, schizoaffective disorder, COPD, thrombocytopenia, seizure disorder, who is a long-term resident of a nursing facility, was brought to the hospital with progressive changes in mental status. Patient had been coming increasingly aggressive, was refusing any medications, becoming paranoid. More recently, staff had noted that he was drinking his urine. He was evaluated in the emergency room and felt that he may have a urinary tract infection was admitted to the hospital service. Further review of urine studies indicate that his positive urine culture is likely a colonization and not a true infection. He did not have any other signs of active infection. Discussed with infectious disease who agreed with not treating with antibiotics. It was felt that his progressive decline in mental status was related to his underlying behavioral health. Seen by psychiatry with recommendations for inpatient psychiatry. He is involuntarily committed. Patient is medically stable for discharge whenever he can be placed by psychiatry.  -Patient is independent with ADLs, ambulatory -patient continues to have almost daily episodes of aggressive behavior requiring security and constant re-direction  Assessment/Plan:  1)Progressive Behavioral Changes--- Initially felt to be related to possible UTI, but patient likely has colonization and not a true urinary tract infection. He has been refusing his medications for weeks to months.He is on several psychotropic medications such as Depakote, Haldol, Ativan. He also takes Lamictal and Keppra for seizures. His behavior changes appear to be chronic progressive in nature and appear to be secondary to his underlying mental illness. Other work-up including CT imaging of head, basic  chemistry, ammonia, TSH, ABG were unrevealing. Appreciate psychiatry input. Recommendations are for inpatient psychiatry. He is currently under involuntary commitment.---- -Inpatient psychiatric admission to a geropsychiatricfacility continues to be recommended -Patient is medically stable for discharge to inpatient psychiatric/behavioral unit when bed becomes available  --- Poor compliance with medications and persistent psychotic type behavior disturbance persist --IVC paperwork resubmitted on 08/07/2020; will further resubmit every 7 days -Continue to follow psychiatry service for medications adjustment and further recommendations- -08/06/20 evening--another episode agression-->increase carbamezapine  After further eval by psych provider-- changes to pt's regimen----  --Depakote was dced bcos it is difficult to monitor levels (pt is not always cooperative with lab draws)--- --Scheduled--c/n Amantadine, Namenda, Haldol, Prozac , Clonidine patch and Tegretol  Prn-- Im Haldol and im Ativan ordered for agitation  2)HTN--BP is not at goal partly due to poor compliance with medications - -will continue oral amlodipine and clonidine patch -BP acceptable  3)Dementia Associated with Neurosyphilis--- Review of records indicate that he was treated for neurosyphilis in 2018.  -He is chronically on Namenda which he has been refusing intermittently.  4)Thrombocytopenia/pancytopenia--patient with some degree of mild pancytopenia which is stable-- Appears to be a chronic issue. -No signs of overt bleeding.  5)Seizure disorder---- Chronically on Keppra and Lamictal.  --He has been refusing these medications intermittently. He has IM Ativan as needed ordered if he has any breakthrough seizures.  6)Noncompliance---- Patient continues refusing medications intermittently.      Status is: Inpatient  Remains inpatient appropriate because:Unsafe d/c plan   Dispo: The patient  is from: SNF  Anticipated d/c is to: Geriatric Psych facility  Anticipated d/c date is: > 3 days  Patient currently is medically stable to d/c.   Of note, since 8/18/2021patient has remained restraints free, despite intermittent episodes of  agitation and having difficulties being redirected at times. -patient continues to have almost daily episodes of aggressive behavior requiring security and constant re-direction     Family Communication:  no Family at bedside  Consultants:  psychiatry  Code Status:  FULL   DVT Prophylaxis:  heparin injection 5,000 Units Start: 07/04/20 0600 SCDs Start: 07/04/20 0130 now he is ambulatory   Procedures: As Listed in Progress Note Above  Antibiotics: None    Subjective:  Patient denies f/c, cp, sob, n/v/d Objective: Vitals:   08/04/20 1906 08/06/20 0610 08/06/20 1500 08/06/20 2345  BP: 127/73 (!) 133/54 140/84 139/74  Pulse: 85 70 93 92  Resp: 20 18 18 18   Temp: 99.1 F (37.3 C) 98.3 F (36.8 C) 97.8 F (36.6 C) 99 F (37.2 C)  TempSrc: Oral Oral Oral Axillary  SpO2: 99% 97% 99% 98%  Weight:      Height:        Intake/Output Summary (Last 24 hours) at 08/08/2020 1727 Last data filed at 08/08/2020 1544 Gross per 24 hour  Intake 560 ml  Output --  Net 560 ml   Weight change:  Exam:   General:  Pt is alert, follows commands appropriately, not in acute distress  HEENT: No icterus, No thrush, No neck mass, [place]/AT  Cardiovascular: refuses exam  Respiratory: refuses exam  Abdomen: refuses  Extremities: No edema, No lymphangitis, No petechiae, No rashes, no synovitis   Data Reviewed: I have personally reviewed following labs and imaging studies Basic Metabolic Panel: Recent Labs  Lab 08/07/20 0623  NA 139  K 3.5  CL 104  CO2 28  GLUCOSE 118*  BUN 19  CREATININE 0.84  CALCIUM 9.6   Liver Function Tests: No results for input(s): AST, ALT, ALKPHOS, BILITOT,  PROT, ALBUMIN in the last 168 hours. No results for input(s): LIPASE, AMYLASE in the last 168 hours. No results for input(s): AMMONIA in the last 168 hours. Coagulation Profile: No results for input(s): INR, PROTIME in the last 168 hours. CBC: Recent Labs  Lab 08/07/20 0623  WBC 2.3*  HGB 11.5*  HCT 37.3*  MCV 89.4  PLT 123*   Cardiac Enzymes: No results for input(s): CKTOTAL, CKMB, CKMBINDEX, TROPONINI in the last 168 hours. BNP: Invalid input(s): POCBNP CBG: Recent Labs  Lab 08/06/20 2343  GLUCAP 130*   HbA1C: No results for input(s): HGBA1C in the last 72 hours. Urine analysis:    Component Value Date/Time   COLORURINE YELLOW 07/03/2020 1510   APPEARANCEUR HAZY (A) 07/03/2020 1510   LABSPEC 1.018 07/03/2020 1510   PHURINE 7.0 07/03/2020 1510   GLUCOSEU NEGATIVE 07/03/2020 1510   HGBUR NEGATIVE 07/03/2020 1510   BILIRUBINUR NEGATIVE 07/03/2020 1510   KETONESUR NEGATIVE 07/03/2020 1510   PROTEINUR NEGATIVE 07/03/2020 1510   NITRITE POSITIVE (A) 07/03/2020 1510   LEUKOCYTESUR NEGATIVE 07/03/2020 1510   Sepsis Labs: @LABRCNTIP (procalcitonin:4,lacticidven:4) )No results found for this or any previous visit (from the past 240 hour(s)).   Scheduled Meds: . amantadine  100 mg Oral BID  . amLODipine  5 mg Oral Daily  . carbamazepine  100 mg Oral QPC breakfast  . carbamazepine  200 mg Oral BID  . cloNIDine  0.2 mg Transdermal Weekly  . FLUoxetine  40 mg Oral Daily  . haloperidol  2 mg Oral TID  . heparin  5,000 Units Subcutaneous Q8H  . lamoTRIgine  50 mg Oral BID  . levETIRAcetam  500 mg Oral BID  . memantine  10 mg Oral QHS  . [  START ON 08/13/2020] paliperidone  156 mg Intramuscular Once  . [START ON 09/08/2020] paliperidone  234 mg Intramuscular Q28 days   Continuous Infusions:  Procedures/Studies: No results found.  Orson Eva, DO  [facility]  If 7PM-7AM, please contact night-coverage [email or web address] Password [email or web address] 08/08/2020, 5:27 PM   LOS: 35  days

## 2020-08-08 NOTE — Progress Notes (Signed)
Patient pushed past staff and walked out into the hall.  Ignoring request for him to return to his room.  Patient proceeded to the first hall emergency exit where he pushed the emergency door open and stepped out onto the balcony.  Security escorted patient back to room where he was put in restraints per MD order.  Patient currently resting quietly.  Will continue to monitor.

## 2020-08-08 NOTE — BHH Counselor (Signed)
TTS reassessment: Patient presents sitting upright in bed. Patient calm and cooperative. He is alert and oriented x 4. He denies SI/HI/AVH. He asked this writer how long he will have to stay in a psychiatric hospital. This counselor informed him psych stays are typically short- around 4 days.   Per chart review patient refusing medications the previous evening. Additionally, patient left unit through emergency exit and went onto balcony. Security was able to return patient to room, however restraints were necessary. Patient seen by PMHNP on 8/25 who stated patient was calm and cooperative as well, however had episode of agitation the previous evening. Medication changes were made and provider continues to recommend in patient treatment for patient.

## 2020-08-09 NOTE — Progress Notes (Signed)
PROGRESS NOTE  Henry Smith ZOX:[id number] DOB: 04/03/1941 DOA: 07/03/2020 PCP: Caprice Renshaw, MD   Brief History: 79 year old male with a history of neurosyphilis, dementia, schizoaffective disorder, COPD, thrombocytopenia, seizure disorder, who is a long-term resident of a nursing facility, was brought to the hospital with progressive changes in mental status. Patient had been coming increasingly aggressive, was refusing any medications, becoming paranoid. More recently, staff had noted that he was drinking his urine. He was evaluated in the emergency room and felt that he may have a urinary tract infection was admitted to the hospital service. Further review of urine studies indicate that his positive urine culture is likely a colonization and not a true infection. He did not have any other signs of active infection. Discussed with infectious disease who agreed with not treating with antibiotics. It was felt that his progressive decline in mental status was related to his underlying behavioral health. Seen by psychiatry with recommendations for inpatient psychiatry. He is involuntarily committed. Patient is medically stable for discharge whenever he can be placed by psychiatry.  -Patient is independent with ADLs, ambulatory -patient continues to have almost daily episodes of aggressive behavior requiring security and constant re-direction  Assessment/Plan:  1)Progressive Behavioral Changes--- Initially felt to be related to possible UTI, but patient likely has colonization and not a true urinary tract infection. He has been refusing his medications for weeks to months.He is on several psychotropic medications such as Depakote, Haldol, Ativan. He also takes Lamictal and Keppra for seizures. His behavior changes appear to be chronic progressive in nature and appear to be secondary to his underlying mental illness. Other work-up including CT imaging of head, basic  chemistry, ammonia, TSH, ABG were unrevealing. Appreciate psychiatry input. Recommendations are for inpatient psychiatry. He is currently under involuntary commitment.---- -Inpatient psychiatric admission to a geropsychiatricfacility continues to be recommended -Patient is medically stable for discharge to inpatient psychiatric/behavioral unit when bed becomes available  --- Poor compliance with medications and persistent psychotic type behavior disturbance persist --IVC paperwork resubmitted on 08/07/2020; will further resubmitevery 7 days -Continue to follow psychiatry service for medications adjustment and further recommendations- -08/06/20 evening--another episode agression-->increase carbamezapine -08/09/20--d/c lamictal per psychiatry; tegretol level in am  After further eval by psych provider-- changes to pt's regimen----  --Depakote was dced bcos it is difficult to monitor levels (pt is not always cooperative with lab draws)--- --Scheduled--c/n Amantadine, Namenda, Haldol, Prozac , Clonidine patch and Tegretol  Prn-- Im Haldol and im Ativan orderedfor agitation  2)HTN--BP is not at goal partly due to poor compliance with medications - -will continue oral amlodipine and clonidine patch -BP acceptable  3)Dementia Associated with Neurosyphilis--- Review of records indicate that he was treated for neurosyphilis in 2018.  -He is chronically on Namenda which he has been refusing intermittently.  4)Thrombocytopenia/pancytopenia--patient with some degree of mild pancytopenia which is stable-- Appears to be a chronic issue. -No signs of overt bleeding.  5)Seizure disorder---- Chronically on Keppra and Lamictal.  --He has been refusing these medicationsintermittently. He has IM Ativan as needed ordered if he has any breakthrough seizures.  6)Noncompliance---- Patient continues refusing medications intermittently.      Status is: Inpatient  Remains inpatient  appropriate because:Unsafe d/c plan   Dispo: The patient is from:SNF Anticipated d/c is [id number] Anticipated d/c date is: > 3 days Patient currently is medically stable to d/c.   Of note, since 8/18/2021patient has remained restraints free, despite intermittent episodes  of agitation and having difficulties being redirected at times. -patient continues to have almost daily episodes of aggressive behavior requiring security and constant re-direction     Family Communication:noFamily at bedside  Consultants:psychiatry  Code Status: FULL   DVT Prophylaxis:heparin injection 5,000 Units Start: 07/04/20 0600 SCDs Start: 07/04/20 0130 now he is ambulatory   Procedures: As Listed in Progress Note Above  Antibiotics: None    Subjective: Patient only intermittently speaks.  Denies cp, sob, n/v/d or abd pain  Objective: Vitals:   08/04/20 1906 08/06/20 0610 08/06/20 1500 08/06/20 2345  BP: 127/73 (!) 133/54 140/84 139/74  Pulse: 85 70 93 92  Resp: 20 18 18 18   Temp: 99.1 F (37.3 C) 98.3 F (36.8 C) 97.8 F (36.6 C) 99 F (37.2 C)  TempSrc: Oral Oral Oral Axillary  SpO2: 99% 97% 99% 98%  Weight:      Height:       No intake or output data in the 24 hours ending 08/09/20 1817 Weight change:  Exam:   General:  Pt is alert, follows commands appropriately, not in acute distress  HEENT: No icterus, No thrush, No neck mass, [place]/AT  Cardiovascular: refused  Respiratory: refused  Abdomen: refused  Extremities: No edema, No lymphangitis, No petechiae, No rashes, no synovitis   Data Reviewed: I have personally reviewed following labs and imaging studies Basic Metabolic Panel: Recent Labs  Lab 08/07/20 0623  NA 139  K 3.5  CL 104  CO2 28  GLUCOSE 118*  BUN 19  CREATININE 0.84  CALCIUM 9.6   Liver Function Tests: No results for input(s): AST, ALT, ALKPHOS, BILITOT, PROT,  ALBUMIN in the last 168 hours. No results for input(s): LIPASE, AMYLASE in the last 168 hours. No results for input(s): AMMONIA in the last 168 hours. Coagulation Profile: No results for input(s): INR, PROTIME in the last 168 hours. CBC: Recent Labs  Lab 08/07/20 0623  WBC 2.3*  HGB 11.5*  HCT 37.3*  MCV 89.4  PLT 123*   Cardiac Enzymes: No results for input(s): CKTOTAL, CKMB, CKMBINDEX, TROPONINI in the last 168 hours. BNP: Invalid input(s): POCBNP CBG: Recent Labs  Lab 08/06/20 2343  GLUCAP 130*   HbA1C: No results for input(s): HGBA1C in the last 72 hours. Urine analysis:    Component Value Date/Time   COLORURINE YELLOW 07/03/2020 1510   APPEARANCEUR HAZY (A) 07/03/2020 1510   LABSPEC 1.018 07/03/2020 1510   PHURINE 7.0 07/03/2020 1510   GLUCOSEU NEGATIVE 07/03/2020 1510   HGBUR NEGATIVE 07/03/2020 1510   BILIRUBINUR NEGATIVE 07/03/2020 1510   KETONESUR NEGATIVE 07/03/2020 1510   PROTEINUR NEGATIVE 07/03/2020 1510   NITRITE POSITIVE (A) 07/03/2020 1510   LEUKOCYTESUR NEGATIVE 07/03/2020 1510   Sepsis Labs: @LABRCNTIP (procalcitonin:4,lacticidven:4) )No results found for this or any previous visit (from the past 240 hour(s)).   Scheduled Meds: . amantadine  100 mg Oral BID  . amLODipine  5 mg Oral Daily  . carbamazepine  100 mg Oral QPC breakfast  . carbamazepine  200 mg Oral BID  . cloNIDine  0.2 mg Transdermal Weekly  . FLUoxetine  40 mg Oral Daily  . haloperidol  2 mg Oral TID  . heparin  5,000 Units Subcutaneous Q8H  . levETIRAcetam  500 mg Oral BID  . memantine  10 mg Oral QHS  . [START ON 08/13/2020] paliperidone  156 mg Intramuscular Once  . [START ON 09/08/2020] paliperidone  234 mg Intramuscular Q28 days   Continuous Infusions:  Procedures/Studies: No results found.  Orson Eva, DO  [facility]  If 7PM-7AM, please contact night-coverage [email or web address] Password [facility] 08/09/2020, 6:17 PM   LOS: 36 days

## 2020-08-09 NOTE — Progress Notes (Signed)
Patient ID: Henry Smith, male   DOB: 01-31-1941, 79 y.o.   MRN: [id number] Brief History: 79 year old male with a history of neurosyphilis, dementia, schizoaffective disorder, COPD, thrombocytopenia, seizure disorder, who is a long-term resident of a nursing facility, was brought to the hospital with progressive changes in mental status. Patient had been coming increasingly aggressive, was refusing any medications, becoming paranoid. More recently, staff had noted that he was drinking his urine. He was evaluated in the emergency room and felt that he may have a urinary tract infection was admitted to the hospital service. Further review of urine studies indicate that his positive urine culture is likely a colonization and not a true infection. He did not have any other signs of active infection. Discussed with infectious disease who agreed with not treating with antibiotics. It was felt that his progressive decline in mental status was related to his underlying behavioral health. Seen by psychiatry with recommendations for inpatient psychiatry. He is involuntarily committed. Patient is medically stable for discharge whenever he can be placed by psychiatry.  Today, 08-09-20, this provider reviewed patient medications. Discussed this case with Dr. Mallie Darting. It was recommended tor the Lamical to be discontinue as patient is currently on Tegretol (chewable tabs) 100 mg daily & 200 mg bid for mood stabilization. This medication was started on 08-07-20. Will obtain Tegretol level tomorrow morning. Staff continues to reports that patient remains aggressive.

## 2020-08-09 NOTE — Progress Notes (Signed)
Slept most of day .  Gave medication when he woke up, crushed and in milkshake which he drank.

## 2020-08-10 NOTE — BH Assessment (Signed)
Reassessment Note:   Per chart review: "Patient is a resident of [facility].  He was sent to the emergency room for increasing aggression, threatening staff, bizarre behavior such as drinking his own urine."  Per internal medicine note on 07/04/20: "Review of records indicate that patient has been refusing medications, been aggressive for several weeks now.  He does not have any fever or leukocytosis or dysuria.  I feel it is unlikely that his current presentation is solely related to a urinary tract infection.  In fact, based on prior culture results, he may have urinary tract colonization.  The patient has several underlying cognitive issues that could be contributing to his current state.  He has known dementia, history of neurosyphilis, schizoaffective disorder. I suspect this may be worsening of his schizoaffective disorder.  Patient required several doses of Geodon and Ativan in the emergency room.  Since the patient has been repeatedly drinking his own urine, it was felt that he was at risk to harm himself and involuntary commitment orders were placed in the emergency room. "  Upon assessment today, patientis laying in bed, staring at telehealth monitor.  He refuses to engage in an assessment today.  Per RN, Quillian Quince, patient has just refused medications, moments before an attempt to assess patient.  He has consistently refused medications.  Today, he made comments about only taking "brand name medications." Staff have been able to crush medications into milkshakes and this has been effective at points.  Medications have been adjusted, however minimal improvement noted, given that he frequently refuses medications.  It appears patient continues to meet inpatient criteria.    TTS will continue to pursue appropriate programs. He is now priority status with [employer] referral.

## 2020-08-10 NOTE — Progress Notes (Signed)
PROGRESS NOTE  Henry Smith HGD:[id number] DOB: [birth year] DOA: 07/03/2020 PCP: Caprice Renshaw, MD  Brief History: 79 year old male with a history of neurosyphilis, dementia, schizoaffective disorder, COPD, thrombocytopenia, seizure disorder, who is a long-term resident of a nursing facility, was brought to the hospital with progressive changes in mental status. Patient had been coming increasingly aggressive, was refusing any medications, becoming paranoid. More recently, staff had noted that he was drinking his urine. He was evaluated in the emergency room and felt that he may have a urinary tract infection was admitted to the hospital service. Further review of urine studies indicate that his positive urine culture is likely a colonization and not a true infection. He did not have any other signs of active infection. Discussed with infectious disease who agreed with not treating with antibiotics. It was felt that his progressive decline in mental status was related to his underlying behavioral health. Seen by psychiatry with recommendations for inpatient psychiatry. He is involuntarily committed. Patient is medically stable for discharge whenever he can be placed by psychiatry.  -Patient is independent with ADLs, ambulatory -patient continues to have almost daily episodes of aggressive behavior requiring security and constant re-direction  Assessment/Plan:  1)Progressive Behavioral Changes--- Initially felt to be related to possible UTI, but patient likely has colonization and not a true urinary tract infection. He has been refusing his medications for weeks to months.He is on several psychotropic medications such as Depakote, Haldol, Ativan. He also takes Lamictal and Keppra for seizures. His behavior changes appear to be chronic progressive in nature and appear to be secondary to his underlying mental illness. Other work-up including CT imaging of head, basic  chemistry, ammonia, TSH, ABG were unrevealing. Appreciate psychiatry input. Recommendations are for inpatient psychiatry. He is currently under involuntary commitment.---- -Inpatient psychiatric admission to a geropsychiatricfacility continues to be recommended -Patient is medically stable for discharge to inpatient psychiatric/behavioral unit when bed becomes available  --- Poor compliance with medications and persistent psychotic type behavior disturbance persist --IVC paperwork resubmitted on 08/07/2020; will further resubmitevery 7 days -Continue to follow psychiatry service for medications adjustment and further recommendations- -08/06/20 evening--another episode agression-->increase carbamezapine -08/09/20--d/c lamictal per psychiatry; tegretol level in am 08/10/20--continues to intermittently refuse medicine.  Verbally abusive today, cursing although not physically threatening  After further eval by psych provider-- changes to pt's regimen----  --Depakote was dced bcos it is difficult to monitor levels (pt is not always cooperative with lab draws)--- --Scheduled--c/n Amantadine, Namenda, Haldol, Prozac , Clonidine patch and Tegretol  Prn-- Im Haldol and im Ativan orderedfor agitation  2)HTN--BP is not at goal partly due to poor compliance with medications - -will continue oral amlodipine and clonidine patch -BP acceptable  3)Dementia Associated with Neurosyphilis--- Review of records indicate that he was treated for neurosyphilis in 2018.  -He is chronically on Namenda which he has been refusing intermittently.  4)Thrombocytopenia/pancytopenia--patient with some degree of mild pancytopenia which is stable-- Appears to be a chronic issue. -No signs of overt bleeding.  5)Seizure disorder---- Chronically on Keppra and Lamictal.  --He has been refusing these medicationsintermittently. He has IM Ativan as needed ordered if he has any breakthrough  seizures.  6)Noncompliance---- Patient continues refusing medications intermittently.      Status is: Inpatient  Remains inpatient appropriate because:Unsafe d/c plan   Dispo: The patient is from:SNF Anticipated d/c is [id number] facility Anticipated d/c date is: > 3 days Patient currently is medically stable to d/c.  Of note, since 8/18/2021patient has remained restraints free, despite intermittent episodes of agitation and having difficulties being redirected at times. -patient continues to have almost daily episodes of aggressive behavior requiring security and constant re-direction     Family Communication:noFamily at bedside  Consultants:psychiatry  Code Status: FULL   DVT Prophylaxis:heparin injection 5,000 Units Start: 07/04/20 0600 SCDs Start: 07/04/20 0130 now he is ambulatory   Procedures: As Listed in Progress Note Above  Antibiotics: None     Subjective: Today he verbally cursing but not physically threatening today.  He continues to intermittently refuse meds.  No vomiting.  Tolerating diet.  Denies any pain, sob.  Refuses to answer any other questions  Objective: Vitals:   08/06/20 0610 08/06/20 1500 08/06/20 2345 08/10/20 0551  BP: (!) 133/54 140/84 139/74 126/72  Pulse: 70 93 92 77  Resp: 18 18 18 20   Temp: 98.3 F (36.8 C) 97.8 F (36.6 C) 99 F (37.2 C) 98.1 F (36.7 C)  TempSrc: Oral Oral Axillary Oral  SpO2: 97% 99% 98% 96%  Weight:      Height:        Intake/Output Summary (Last 24 hours) at 08/10/2020 1641 Last data filed at 08/10/2020 1551 Gross per 24 hour  Intake 2640 ml  Output --  Net 2640 ml   Weight change:  Exam:   General:  Pt is alert, occasionally follows commands appropriately, not in acute distress  HEENT: No icterus,  [place]/AT  Cardiovascular: refuses  Respiratory: refuses  Abdomen: refuses  Extremities: No edema, No  lymphangitis, No petechiae, No rashes,    Data Reviewed: I have personally reviewed following labs and imaging studies Basic Metabolic Panel: Recent Labs  Lab 08/07/20 0623  NA 139  K 3.5  CL 104  CO2 28  GLUCOSE 118*  BUN 19  CREATININE 0.84  CALCIUM 9.6   Liver Function Tests: No results for input(s): AST, ALT, ALKPHOS, BILITOT, PROT, ALBUMIN in the last 168 hours. No results for input(s): LIPASE, AMYLASE in the last 168 hours. No results for input(s): AMMONIA in the last 168 hours. Coagulation Profile: No results for input(s): INR, PROTIME in the last 168 hours. CBC: Recent Labs  Lab 08/07/20 0623  WBC 2.3*  HGB 11.5*  HCT 37.3*  MCV 89.4  PLT 123*   Cardiac Enzymes: No results for input(s): CKTOTAL, CKMB, CKMBINDEX, TROPONINI in the last 168 hours. BNP: Invalid input(s): POCBNP CBG: Recent Labs  Lab 08/06/20 2343  GLUCAP 130*   HbA1C: No results for input(s): HGBA1C in the last 72 hours. Urine analysis:    Component Value Date/Time   COLORURINE YELLOW 07/03/2020 1510   APPEARANCEUR HAZY (A) 07/03/2020 1510   LABSPEC 1.018 07/03/2020 1510   PHURINE 7.0 07/03/2020 1510   GLUCOSEU NEGATIVE 07/03/2020 1510   HGBUR NEGATIVE 07/03/2020 1510   BILIRUBINUR NEGATIVE 07/03/2020 1510   KETONESUR NEGATIVE 07/03/2020 1510   PROTEINUR NEGATIVE 07/03/2020 1510   NITRITE POSITIVE (A) 07/03/2020 1510   LEUKOCYTESUR NEGATIVE 07/03/2020 1510   Sepsis Labs: @LABRCNTIP (procalcitonin:4,lacticidven:4) )No results found for this or any previous visit (from the past 240 hour(s)).   Scheduled Meds: . amantadine  100 mg Oral BID  . amLODipine  5 mg Oral Daily  . carbamazepine  100 mg Oral QPC breakfast  . carbamazepine  200 mg Oral BID  . cloNIDine  0.2 mg Transdermal Weekly  . FLUoxetine  40 mg Oral Daily  . haloperidol  2 mg Oral TID  . heparin  5,000 Units Subcutaneous  Q8H  . levETIRAcetam  500 mg Oral BID  . memantine  10 mg Oral QHS  . [START ON 08/13/2020]  paliperidone  156 mg Intramuscular Once  . [START ON 09/08/2020] paliperidone  234 mg Intramuscular Q28 days   Continuous Infusions:  Procedures/Studies: No results found.  Orson Eva, DO  [facility]  If 7PM-7AM, please contact night-coverage [email or web address] Password [email or web address] 08/10/2020, 4:41 PM   LOS: 37 days

## 2020-08-10 NOTE — Progress Notes (Signed)
Patient refused all medications.  Patient also verbally abusive to nursing staff.

## 2020-08-10 NOTE — Progress Notes (Signed)
Pt remains on waitlist at [facility] per Cat in admissions.  Kaelynn Igo S. Ouida Sills, MSW, LCSW Clinical Social Worker 08/10/2020 12:32 PM

## 2020-08-11 NOTE — Progress Notes (Signed)
PROGRESS NOTE  Henry Smith FIE:[id number] DOB: 1941/04/12 DOA: 07/03/2020 PCP: Caprice Renshaw, MD  Brief History: 79 year old male with a history of neurosyphilis, dementia, schizoaffective disorder, COPD, thrombocytopenia, seizure disorder, who is a long-term resident of a nursing facility, was brought to the hospital with progressive changes in mental status. Patient had been coming increasingly aggressive, was refusing any medications, becoming paranoid. More recently, staff had noted that he was drinking his urine. He was evaluated in the emergency room and felt that he may have a urinary tract infection was admitted to the hospital service. Further review of urine studies indicate that his positive urine culture is likely a colonization and not a true infection. He did not have any other signs of active infection. Discussed with infectious disease who agreed with not treating with antibiotics. It was felt that his progressive decline in mental status was related to his underlying behavioral health. Seen by psychiatry with recommendations for inpatient psychiatry. He is involuntarily committed. Patient is medically stable for discharge whenever he can be placed by psychiatry.  -Patient is independent with ADLs, ambulatory -patient continues to have almost daily episodes of aggressive behavior requiring security and constant re-direction  Assessment/Plan:  1)Progressive Behavioral Changes--- Initially felt to be related to possible UTI, but patient likely has colonization and not a true urinary tract infection. He has been refusing his medications for weeks to months.He is on several psychotropic medications such as Depakote, Haldol, Ativan. He also takes Lamictal and Keppra for seizures. His behavior changes appear to be chronic progressive in nature and appear to be secondary to his underlying mental illness. Other work-up including CT imaging of head, basic  chemistry, ammonia, TSH, ABG were unrevealing. Appreciate psychiatry input. Recommendations are for inpatient psychiatry. He is currently under involuntary commitment.---- -Inpatient psychiatric admission to a geropsychiatricfacility continues to be recommended -Patient is medically stable for discharge to inpatient psychiatric/behavioral unit when bed becomes available  --- Poor compliance with medications and persistent psychotic type behavior disturbance persist --IVC paperwork resubmitted on 08/07/2020; will further resubmitevery 7 days -Continue to follow psychiatry service for medications adjustment and further recommendations- -08/06/20 evening--another episode agression-->increase carbamezapine -08/09/20--d/c lamictal per psychiatry; tegretol level in am 08/10/20--continues to intermittently refuse medicine.  Verbally abusive today, cursing although not physically threatening  After further eval by psych provider-- changes to pt's regimen----  --Depakote was dced bcos it is difficult to monitor levels (pt is not always cooperative with lab draws)--- --Scheduled--c/n Amantadine, Namenda, Haldol, Prozac , Clonidine patch and Tegretol  Prn-- Im Haldol and im Ativan orderedfor agitation  2)HTN--BP is not at goal partly due to poor compliance with medications - -will continue oral amlodipine and clonidine patch -BP acceptable  3)Dementia Associated with Neurosyphilis--- Review of records indicate that he was treated for neurosyphilis in 2018.  -He is chronically on Namenda which he has been refusing intermittently.  4)Thrombocytopenia/pancytopenia--patient with some degree of mild pancytopenia which is stable-- Appears to be a chronic issue. -No signs of overt bleeding.  5)Seizure disorder---- Chronically on Keppra and Lamictal. -lamictal stopped by psychiatry on 8/27 --He has been refusing these medicationsintermittently. He has IM Ativan as needed ordered if he has any  breakthrough seizures.  6)Noncompliance---- Patient continues refusing medications intermittently.      Status is: Inpatient  Remains inpatient appropriate because:Unsafe d/c plan   Dispo: The patient is from:SNF Anticipated d/c is [id number] Anticipated d/c date is: > 3 days Patient currently  is medically stable to d/c.   Of note, since 8/18/2021patient has remained restraints free, despite intermittent episodes of agitation and having difficulties being redirected at times. -patient continues to have almost daily episodes of aggressive behavior requiring security and constant re-direction     Family Communication:noFamily at bedside  Consultants:psychiatry  Code Status: FULL   DVT Prophylaxis:heparin injection 5,000 Units Start: 07/04/20 0600 SCDs Start: 07/04/20 0130 now he is ambulatory   Procedures: As Listed in Progress Note Above  Antibiotics: None    Subjective: Patient denies cp, sob, abd pain.  He refuses to answer any other questions.  No vomiting or diarrhea  Objective: Vitals:   08/06/20 2345 08/10/20 0551 08/10/20 2239 08/11/20 0621  BP: 139/74 126/72 (!) 164/77 (!) 153/83  Pulse: 92 77 65 65  Resp: 18 20 20 20   Temp:  98.1 F (36.7 C) 98.1 F (36.7 C) 98.3 F (36.8 C)  TempSrc: Axillary Oral Oral Oral  SpO2: 98% 96% 99% 99%  Weight:      Height:        Intake/Output Summary (Last 24 hours) at 08/11/2020 1521 Last data filed at 08/11/2020 0847 Gross per 24 hour  Intake 1760 ml  Output --  Net 1760 ml   Weight change:  Exam:   General:  Pt is alert, intermittently follows commands appropriately, not in acute distress  HEENT: No icterus, No thrush, [place]/AT  Cardiovascular: refused  Respiratory: refused  Abdomen: refused  Extremities: No edema, No lymphangitis, No petechiae, No rashes, no synovitis   Data Reviewed: I have personally  reviewed following labs and imaging studies Basic Metabolic Panel: Recent Labs  Lab 08/07/20 0623  NA 139  K 3.5  CL 104  CO2 28  GLUCOSE 118*  BUN 19  CREATININE 0.84  CALCIUM 9.6   Liver Function Tests: No results for input(s): AST, ALT, ALKPHOS, BILITOT, PROT, ALBUMIN in the last 168 hours. No results for input(s): LIPASE, AMYLASE in the last 168 hours. No results for input(s): AMMONIA in the last 168 hours. Coagulation Profile: No results for input(s): INR, PROTIME in the last 168 hours. CBC: Recent Labs  Lab 08/07/20 0623  WBC 2.3*  HGB 11.5*  HCT 37.3*  MCV 89.4  PLT 123*   Cardiac Enzymes: No results for input(s): CKTOTAL, CKMB, CKMBINDEX, TROPONINI in the last 168 hours. BNP: Invalid input(s): POCBNP CBG: Recent Labs  Lab 08/06/20 2343  GLUCAP 130*   HbA1C: No results for input(s): HGBA1C in the last 72 hours. Urine analysis:    Component Value Date/Time   COLORURINE YELLOW 07/03/2020 1510   APPEARANCEUR HAZY (A) 07/03/2020 1510   LABSPEC 1.018 07/03/2020 1510   PHURINE 7.0 07/03/2020 1510   GLUCOSEU NEGATIVE 07/03/2020 1510   HGBUR NEGATIVE 07/03/2020 1510   BILIRUBINUR NEGATIVE 07/03/2020 1510   KETONESUR NEGATIVE 07/03/2020 1510   PROTEINUR NEGATIVE 07/03/2020 1510   NITRITE POSITIVE (A) 07/03/2020 1510   LEUKOCYTESUR NEGATIVE 07/03/2020 1510   Sepsis Labs: @LABRCNTIP (procalcitonin:4,lacticidven:4) )No results found for this or any previous visit (from the past 240 hour(s)).   Scheduled Meds: . amantadine  100 mg Oral BID  . amLODipine  5 mg Oral Daily  . carbamazepine  100 mg Oral QPC breakfast  . carbamazepine  200 mg Oral BID  . cloNIDine  0.2 mg Transdermal Weekly  . FLUoxetine  40 mg Oral Daily  . haloperidol  2 mg Oral TID  . heparin  5,000 Units Subcutaneous Q8H  . levETIRAcetam  500 mg Oral BID  .  memantine  10 mg Oral QHS  . [START ON 08/13/2020] paliperidone  156 mg Intramuscular Once  . [START ON 09/08/2020] paliperidone   234 mg Intramuscular Q28 days   Continuous Infusions:  Procedures/Studies: No results found.  Orson Eva, DO  [facility]  If 7PM-7AM, please contact night-coverage [email or web address] Password [email or web address] 08/11/2020, 3:21 PM   LOS: 38 days

## 2020-08-11 NOTE — BHH Counselor (Signed)
Per chart review: "Patient is a resident of [facility].He was sent to the emergency room for increasing aggression, threatening staff, bizarre behavior such as drinking his own urine."  Per internal medicine note on 07/04/20: "Review of records indicate that patient has been refusing medications, been aggressive for several weeks now. He does not have any fever or leukocytosis or dysuria. I feel it is unlikely that his current presentation is solely related to a urinary tract infection. In fact, based on prior culture results, he may have urinary tract colonization. The patient has several underlying cognitive issues that could be contributing to his current state. He has known dementia, history of neurosyphilis, schizoaffective disorder. I suspect this may be worsening of his schizoaffective disorder. Patient required several doses of Geodon and Ativan in the emergency room. Since the patient has been repeatedly drinking his own urine, it was felt that he was at risk to harm himself andinvoluntary commitment orders were placed in the emergency room. "  08/11/20:    Pt was reassessed this AM.    He was hostile to reassessment, told author to leave and used explicative.  Declined reassessment, pushed cart into the hallway.

## 2020-08-11 NOTE — Progress Notes (Addendum)
TTS called and he went to the bathroom, the said they would call back in a few minutes. They called back and when he came on the screen he screamed get this "GD thing out my GD room before I throw the damn thing out." The gentlemen from behavioral health asked did he want to talk he said no, he said ok. He hung up.Security was called.  He unplugged the machine and this tech took the tts machine out of the room. Patient is calmed down and laying down in bed. Patient got out of bed slammed his door shut. Security came back and talked with patient and nurse is aware.

## 2020-08-11 NOTE — Progress Notes (Signed)
Patient hollering out slamming room door. This tech opened room door. He started throwing crackers, had a basin with water in it, he threw milk out of the room into the hallway at this tech. Slamming room door, getting in corner of room where you couldn't see him. Security called back to room, this tech emptied wash basin before he could throw that at staff. Nurse aware. Patient has calmed down and is room in chair.

## 2020-08-11 NOTE — Progress Notes (Signed)
Patient has refused medications at this time. Patient stated that "he don't take medications" MD has been notified.

## 2020-08-12 LAB — COMPREHENSIVE METABOLIC PANEL
ALT: 30 U/L (ref 0–44)
AST: 20 U/L (ref 15–41)
Albumin: 3.7 g/dL (ref 3.5–5.0)
Alkaline Phosphatase: 54 U/L (ref 38–126)
Anion gap: 8 (ref 5–15)
BUN: 17 mg/dL (ref 8–23)
CO2: 28 mmol/L (ref 22–32)
Calcium: 9.4 mg/dL (ref 8.9–10.3)
Chloride: 106 mmol/L (ref 98–111)
Creatinine, Ser: 0.77 mg/dL (ref 0.61–1.24)
GFR calc Af Amer: >60 mL/min (ref >60)
GFR calc non Af Amer: >60 mL/min (ref >60)
Glucose, Bld: 92 mg/dL (ref 70–99)
Potassium: 4 mmol/L (ref 3.5–5.1)
Sodium: 142 mmol/L (ref 135–145)
Total Bilirubin: 0.4 mg/dL (ref 0.3–1.2)
Total Protein: 6.8 g/dL (ref 6.5–8.1)

## 2020-08-12 LAB — CARBAMAZEPINE LEVEL, TOTAL: Carbamazepine Lvl: 5.5 ug/mL (ref 4.0–12.0)

## 2020-08-12 MED ORDER — VITAMIN B-12 100 MCG PO TABS
500.0000 ug | ORAL_TABLET | Freq: Every day | ORAL | Status: DC
  Administered 2020-08-13 – 2020-09-10 (×19): 500 ug via ORAL
  Filled 2020-08-12 (×23): qty 5

## 2020-08-12 MED ORDER — FOLIC ACID 1 MG PO TABS
1.0000 mg | ORAL_TABLET | Freq: Every day | ORAL | Status: DC
  Administered 2020-08-13 – 2020-09-10 (×20): 1 mg via ORAL
  Filled 2020-08-12 (×23): qty 1

## 2020-08-12 NOTE — BHH Counselor (Signed)
Reassessment Note:   Per chart review: "Patient is a resident of [facility].He was sent to the emergency room for increasing aggression, threatening staff, bizarre behavior such as drinking his own urine."  Patient sitting in a chair during the reassessment. When asked why's he's here patient stated "against my free well." Patient then said, "Let me ask you some questions for a change and you answer them." Patient asked TTS assessor "why are you trying to counsel me and your not a counselor." TTS asked patient has he been taking his medication he responded yes. He denied suicidal/homicial ideations stating "I love my self then started blowing kissing." Patient denied auditory/visual hallucinations. Patient ended the TTS assessment singing to TTS assessor.   Chart review on 08/11/2020 by Orson Eva, MD - 79 year old male with a history of neurosyphilis, dementia, schizoaffective disorder, COPD, thrombocytopenia, seizure disorder, who is a long-term resident of a nursing facility, was brought to the hospital with progressive changes in mental status. Patient had been coming increasingly aggressive, was refusing any medications, becoming paranoid. More recently, staff had noted that he was drinking his urine. He was evaluated in the emergency room and felt that he may have a urinary tract infection was admitted to the hospital service. Further review of urine studies indicate that his positive urine culture is likely a colonization and not a true infection. He did not have any other signs of active infection. Discussed with infectious disease who agreed with not treating with antibiotics. It was felt that his progressive decline in mental status was related to his underlying behavioral health. Seen by psychiatry with recommendations for inpatient psychiatry. He is involuntarily committed. Patient is medically stable for discharge whenever he can be placed by psychiatry.  -Patient is independent  with ADLs, ambulatory -patient continues to have almost daily episodes of aggressive behavior requiring security and constant re-direction

## 2020-08-12 NOTE — BHH Counselor (Signed)
Disposition:   Per Mordecai Maes, NP, continue to recommend inpatient treatment

## 2020-08-12 NOTE — Progress Notes (Signed)
PROGRESS NOTE  Henry Smith SWF:[id number] DOB: [birth year] DOA: 07/03/2020 PCP: Caprice Renshaw, MD  Brief History: 79 year old male with a history of neurosyphilis, dementia, schizoaffective disorder, COPD, thrombocytopenia, seizure disorder, who is a long-term resident of a nursing facility, was brought to the hospital with progressive changes in mental status. Patient had been coming increasingly aggressive, was refusing any medications, becoming paranoid. More recently, staff had noted that he was drinking his urine. He was evaluated in the emergency room and felt that he may have a urinary tract infection was admitted to the hospital service. Further review of urine studies indicate that his positive urine culture is likely a colonization and not a true infection. He did not have any other signs of active infection. Discussed with infectious disease who agreed with not treating with antibiotics. It was felt that his progressive decline in mental status was related to his underlying behavioral health. Seen by psychiatry with recommendations for inpatient psychiatry. He is involuntarily committed. Patient is medically stable for discharge whenever he can be placed by psychiatry.  -Patient is independent with ADLs, ambulatory -patient continues to have almost daily episodes of aggressive behavior requiring security and constant re-direction  Assessment/Plan:  1)Progressive Behavioral Changes--- Initially felt to be related to possible UTI, but patient likely has colonization and not a true urinary tract infection. He has been refusing his medications for weeks to months.He is on several psychotropic medications such as Depakote, Haldol, Ativan. He also takes Lamictal and Keppra for seizures. His behavior changes appear to be chronic progressive in nature and appear to be secondary to his underlying mental illness. Other work-up including CT imaging of head, basic  chemistry, ammonia, TSH, ABG were unrevealing. Appreciate psychiatry input. Recommendations are for inpatient psychiatry. He is currently under involuntary commitment.---- -Inpatient psychiatric admission to a geropsychiatricfacility continues to be recommended -Patient is medically stable for discharge to inpatient psychiatric/behavioral unit when bed becomes available  --- Poor compliance with medications and persistent psychotic type behavior disturbance persist --IVC paperwork resubmitted on 08/07/2020; will further resubmitevery 7 days -Continue to follow psychiatry service for medications adjustment and further recommendations- -08/06/20 evening--another episode agression-->increase carbamezapine -08/09/20--d/c lamictal per psychiatry; tegretol level in am 08/10/20--continues to intermittently refuse medicine. Verbally abusive today, cursing although not physically threatening -08/12/20--less agitated and aggressive today  After further eval by psych provider-- changes to pt's regimen----  --Depakote was dced bcos it is difficult to monitor levels (pt is not always cooperative with lab draws)--- --Scheduled--c/n Amantadine, Namenda, Haldol, Prozac , Clonidine patch and Tegretol  Prn-- Im Haldol and im Ativan orderedfor agitation  2)HTN--BP is not at goal partly due to poor compliance with medications - -will continue oral amlodipine and clonidine patch -BP acceptable  3)Dementia Associated with Neurosyphilis--- Review of records indicate that he was treated for neurosyphilis in 2018.  -He is chronically on Namenda which he has been refusing intermittently.  4)Thrombocytopenia/pancytopenia--patient with some degree of mild pancytopenia which is stable-- Appears to be a chronic issue. -No signs of overt bleeding.  5)Seizure disorder---- Chronically on Keppra and Lamictal. -lamictal stopped by psychiatry on 8/27 --He has been refusing these medicationsintermittently. He  has IM Ativan as needed ordered if he has any breakthrough seizures.  6)Noncompliance---- Patient continues refusing medications intermittently.      Status is: Inpatient  Remains inpatient appropriate because:Unsafe d/c plan   Dispo: The patient is from:SNF Anticipated d/c is [id number] Anticipated d/c date is: >  3 days Patient currently is medically stable to d/c.   Of note, since 8/18/2021patient has remained restraints free, despite intermittent episodes of agitation and having difficulties being redirected at times. -patient continues to have almost daily episodes of aggressive behavior requiring security and constant re-direction but overall episodes have become less frequent     Family Communication:noFamily at bedside  Consultants:psychiatry  Code Status: FULL   DVT Prophylaxis:heparin injection 5,000 Units Start: 07/04/20 0600 SCDs Start: 07/04/20 0130 now he is ambulatory   Procedures: As Listed in Progress Note Above  Antibiotics: None  Subjective:   Objective: Vitals:   08/10/20 2239 08/11/20 0621 08/11/20 2204 08/11/20 2312  BP: (!) 164/77 (!) 153/83 139/81   Pulse: 65 65 72   Resp: 20 20 20    Temp: 98.1 F (36.7 C) 98.3 F (36.8 C) 98.4 F (36.9 C)   TempSrc: Oral Oral Oral   SpO2: 99% 99% 98%   Weight:    99 kg  Height:        Intake/Output Summary (Last 24 hours) at 08/12/2020 1856 Last data filed at 08/12/2020 1720 Gross per 24 hour  Intake 1800 ml  Output --  Net 1800 ml   Weight change:  Exam:   General:  Pt is alert, follows commands appropriately, not in acute distress  HEENT: No icterus, No thrush, No neck mass, [place]/AT  Cardiovascular: RRR, S1/S2, no rubs, no gallops  Respiratory: CTA bilaterally, no wheezing, no crackles, no rhonchi  Abdomen: Soft/+BS, non tender, non distended, no guarding  Extremities: No edema, No  lymphangitis, No petechiae, No rashes, no synovitis   Data Reviewed: I have personally reviewed following labs and imaging studies Basic Metabolic Panel: Recent Labs  Lab 08/07/20 0623  NA 139  K 3.5  CL 104  CO2 28  GLUCOSE 118*  BUN 19  CREATININE 0.84  CALCIUM 9.6   Liver Function Tests: No results for input(s): AST, ALT, ALKPHOS, BILITOT, PROT, ALBUMIN in the last 168 hours. No results for input(s): LIPASE, AMYLASE in the last 168 hours. No results for input(s): AMMONIA in the last 168 hours. Coagulation Profile: No results for input(s): INR, PROTIME in the last 168 hours. CBC: Recent Labs  Lab 08/07/20 0623  WBC 2.3*  HGB 11.5*  HCT 37.3*  MCV 89.4  PLT 123*   Cardiac Enzymes: No results for input(s): CKTOTAL, CKMB, CKMBINDEX, TROPONINI in the last 168 hours. BNP: Invalid input(s): POCBNP CBG: Recent Labs  Lab 08/06/20 2343  GLUCAP 130*   HbA1C: No results for input(s): HGBA1C in the last 72 hours. Urine analysis:    Component Value Date/Time   COLORURINE YELLOW 07/03/2020 1510   APPEARANCEUR HAZY (A) 07/03/2020 1510   LABSPEC 1.018 07/03/2020 1510   PHURINE 7.0 07/03/2020 1510   GLUCOSEU NEGATIVE 07/03/2020 1510   HGBUR NEGATIVE 07/03/2020 1510   BILIRUBINUR NEGATIVE 07/03/2020 1510   KETONESUR NEGATIVE 07/03/2020 1510   PROTEINUR NEGATIVE 07/03/2020 1510   NITRITE POSITIVE (A) 07/03/2020 1510   LEUKOCYTESUR NEGATIVE 07/03/2020 1510   Sepsis Labs: @LABRCNTIP (procalcitonin:4,lacticidven:4) )No results found for this or any previous visit (from the past 240 hour(s)).   Scheduled Meds: . amantadine  100 mg Oral BID  . amLODipine  5 mg Oral Daily  . carbamazepine  100 mg Oral QPC breakfast  . carbamazepine  200 mg Oral BID  . cloNIDine  0.2 mg Transdermal Weekly  . FLUoxetine  40 mg Oral Daily  . haloperidol  2 mg Oral TID  . heparin  5,000 Units Subcutaneous Q8H  . levETIRAcetam  500 mg Oral BID  . memantine  10 mg Oral QHS  . [START ON  08/13/2020] paliperidone  156 mg Intramuscular Once  . [START ON 09/08/2020] paliperidone  234 mg Intramuscular Q28 days   Continuous Infusions:  Procedures/Studies: No results found.  Orson Eva, DO  [facility]  If 7PM-7AM, please contact night-coverage [email or web address] Password [email or web address] 08/12/2020, 6:56 PM   LOS: 39 days

## 2020-08-13 NOTE — Progress Notes (Signed)
Went in to give [place] shot. Pt. Was willing to take. Pt. Spoke in a sexual nature during injection. Said things like "let me suck your titties" and "I'm gonna grab your pussy" Pt. Did not touch me only spoke of it. Pt. Let me give him the IM injection.

## 2020-08-13 NOTE — BH Assessment (Signed)
Per Ron at [facility], patient remains on their priority list for a [facility] bed. LCSW Disposition staff will continue to follow up with patient's disposition.

## 2020-08-13 NOTE — Progress Notes (Signed)
PROGRESS NOTE  Henry Smith HYW:[id number] DOB: [birth year] DOA: 07/03/2020 PCP: Caprice Renshaw, MD   Brief History: 79 year old male with a history of neurosyphilis, dementia, schizoaffective disorder, COPD, thrombocytopenia, seizure disorder, who is a long-term resident of a nursing facility, was brought to the hospital with progressive changes in mental status. Patient had been coming increasingly aggressive, was refusing any medications, becoming paranoid. More recently, staff had noted that he was drinking his urine. He was evaluated in the emergency room and felt that he may have a urinary tract infection was admitted to the hospital service. Further review of urine studies indicate that his positive urine culture is likely a colonization and not a true infection. He did not have any other signs of active infection. Discussed with infectious disease who agreed with not treating with antibiotics. It was felt that his progressive decline in mental status was related to his underlying behavioral health. Seen by psychiatry with recommendations for inpatient psychiatry. He is involuntarily committed. Patient is medically stable for discharge whenever he can be placed by psychiatry.  -Patient is independent with ADLs, ambulatory -patient continues to have intermittent episodes of aggressive behavior requiring security and constant re-direction  Assessment/Plan:  1)Progressive Behavioral Changes--- Initially felt to be related to possible UTI, but patient likely has colonization and not a true urinary tract infection. He has been refusing his medications for weeks to months.He is on several psychotropic medications such as Depakote, Haldol, Ativan. He also takes Lamictal and Keppra for seizures. His behavior changes appear to be chronic progressive in nature and appear to be secondary to his underlying mental illness. Other work-up including CT imaging of head, basic  chemistry, ammonia, TSH, ABG were unrevealing. Appreciate psychiatry input. Recommendations are for inpatient psychiatry. He is currently under involuntary commitment.---- -Inpatient psychiatric admission to a geropsychiatricfacility continues to be recommended -Patient is medically stable for discharge to inpatient psychiatric/behavioral unit when bed becomes available  --- Poor compliance with medications and persistent psychotic type behavior disturbance persist --IVC paperwork resubmitted on 08/07/2020; will further resubmitevery 7 days -Continue to follow psychiatry service for medications adjustment and further recommendations- -08/06/20 evening--another episode agression-->increase carbamezapine -08/09/20--d/c lamictal per psychiatry; tegretol level in am 08/10/20--continues to intermittently refuse medicine. Verbally abusive today, cursing although not physically threatening -08/12/20--less agitated and aggressive today  After further eval by psych provider-- changes to pt's regimen----  --Depakote was dced bcos it is difficult to monitor levels (pt is not always cooperative with lab draws)--- --Scheduled--c/n Amantadine, Namenda, Haldol, Prozac , Clonidine patch and Tegretol  Prn-- Im Haldol and im Ativan orderedfor agitation  2)HTN--BP is not at goal partly due to poor compliance with medications - -will continue oral amlodipine and clonidine patch -BP acceptable  3)Dementia Associated with Neurosyphilis--- Review of records indicate that he was treated for neurosyphilis in 2018.  -He is chronically on Namenda which he has been refusing intermittently.  4)Thrombocytopenia/pancytopenia--patient with some degree of mild pancytopenia which is stable-- Appears to be a chronic issue. -No signs of overt bleeding.  5)Seizure disorder---- Chronically on Keppra and Lamictal. -lamictal stopped by psychiatry on 8/27 --He has been refusing these medicationsintermittently. He  has IM Ativan as needed ordered if he has any breakthrough seizures.  6)Noncompliance---- Patient continues refusing medications intermittently.      Status is: Inpatient  Remains inpatient appropriate because:Unsafe d/c plan   Dispo: The patient is from:SNF Anticipated d/c is SW:[facility] facility Anticipated d/c date is: >  3 days Patient currently is medically stable to d/c.   Of note, since 8/18/2021patient has remained restraints free, despite intermittent episodes of agitation and having difficulties being redirected at times. -patient continues to have intermittent episodes of aggressive behavior requiring security and constant re-direction but overall episodes have become less frequent -in the past week, patient appears more redirectable but continues to refuse medications intermittently and becomes aggressive without any inciting factor -He remains medically stable to transfer to inpatient psychiatric facility -Invega injection given 08/13/20     Family Communication:noFamily at bedside  Consultants:psychiatry  Code Status: FULL   DVT Prophylaxis:heparin injection 5,000 Units Start: 07/04/20 0600 SCDs Start: 07/04/20 0130 now he is ambulatory   Procedures: As Listed in Progress Note Above  Antibiotics: None    Subjective: Patient denies fevers, chills, headache, chest pain, dyspnea, nausea, vomiting, diarrhea, abdominal pain,    Objective: Vitals:   08/10/20 2239 08/11/20 0621 08/11/20 2204 08/11/20 2312  BP: (!) 164/77 (!) 153/83 139/81   Pulse: 65 65 72   Resp: 20 20 20    Temp: 98.1 F (36.7 C) 98.3 F (36.8 C) 98.4 F (36.9 C)   TempSrc: Oral Oral Oral   SpO2: 99% 99% 98%   Weight:    99 kg  Height:        Intake/Output Summary (Last 24 hours) at 08/13/2020 1813 Last data filed at 08/13/2020 1627 Gross per 24 hour  Intake 240 ml  Output 1 ml  Net 239 ml    Weight change:  Exam:   General:  Pt is alert, follows commands appropriately, not in acute distress  HEENT: No icterus, No thrush, No neck mass, [place]/AT  Cardiovascular: RRR, S1/S2, no rubs, no gallops  Respiratory: CTA bilaterally, no wheezing, no crackles, no rhonchi  Abdomen: Soft/+BS, non tender, non distended, no guarding  Extremities: No edema, No lymphangitis, No petechiae, No rashes, no synovitis   Data Reviewed: I have personally reviewed following labs and imaging studies Basic Metabolic Panel: Recent Labs  Lab 08/07/20 0623 08/12/20 1621  NA 139 142  K 3.5 4.0  CL 104 106  CO2 28 28  GLUCOSE 118* 92  BUN 19 17  CREATININE 0.84 0.77  CALCIUM 9.6 9.4   Liver Function Tests: Recent Labs  Lab 08/12/20 1621  AST 20  ALT 30  ALKPHOS 54  BILITOT 0.4  PROT 6.8  ALBUMIN 3.7   No results for input(s): LIPASE, AMYLASE in the last 168 hours. No results for input(s): AMMONIA in the last 168 hours. Coagulation Profile: No results for input(s): INR, PROTIME in the last 168 hours. CBC: Recent Labs  Lab 08/07/20 0623  WBC 2.3*  HGB 11.5*  HCT 37.3*  MCV 89.4  PLT 123*   Cardiac Enzymes: No results for input(s): CKTOTAL, CKMB, CKMBINDEX, TROPONINI in the last 168 hours. BNP: Invalid input(s): POCBNP CBG: Recent Labs  Lab 08/06/20 2343  GLUCAP 130*   HbA1C: No results for input(s): HGBA1C in the last 72 hours. Urine analysis:    Component Value Date/Time   COLORURINE YELLOW 07/03/2020 1510   APPEARANCEUR HAZY (A) 07/03/2020 1510   LABSPEC 1.018 07/03/2020 1510   PHURINE 7.0 07/03/2020 1510   GLUCOSEU NEGATIVE 07/03/2020 1510   HGBUR NEGATIVE 07/03/2020 1510   BILIRUBINUR NEGATIVE 07/03/2020 1510   KETONESUR NEGATIVE 07/03/2020 1510   PROTEINUR NEGATIVE 07/03/2020 1510   NITRITE POSITIVE (A) 07/03/2020 1510   LEUKOCYTESUR NEGATIVE 07/03/2020 1510   Sepsis Labs: @LABRCNTIP (procalcitonin:4,lacticidven:4) )No results found for this or  any  previous visit (from the past 240 hour(s)).   Scheduled Meds: . amantadine  100 mg Oral BID  . amLODipine  5 mg Oral Daily  . carbamazepine  100 mg Oral QPC breakfast  . carbamazepine  200 mg Oral BID  . cloNIDine  0.2 mg Transdermal Weekly  . FLUoxetine  40 mg Oral Daily  . folic acid  1 mg Oral Daily  . haloperidol  2 mg Oral TID  . heparin  5,000 Units Subcutaneous Q8H  . levETIRAcetam  500 mg Oral BID  . memantine  10 mg Oral QHS  . [START ON 09/08/2020] paliperidone  234 mg Intramuscular Q28 days  . vitamin B-12  500 mcg Oral Daily   Continuous Infusions:  Procedures/Studies: No results found.  Orson Eva, DO  [facility]  If 7PM-7AM, please contact night-coverage [email or web address] Password [facility] 08/13/2020, 6:13 PM   LOS: 40 days

## 2020-08-13 NOTE — TOC Progression Note (Addendum)
Transition of Care [facility]) - Progression Note    Patient Details  Name: Henry Smith MRN: [id number] Date of Birth: 04/06/1941  Transition of Care [facility]) CM/SW Contact  Salome Arnt, [place] Phone Number: 08/13/2020, 2:53 PM  Clinical Narrative:   Pt remains on [facility] priority wait list. Updated IVC paperwork completed and faxed to magistrate's office. LCSW confirmed received.     Expected Discharge Plan: Psychiatric Hospital Barriers to Discharge: Psych Bed not available  Expected Discharge Plan and Services Expected Discharge Plan: Psychiatric Hospital In-house Referral: Clinical Social Work     Living arrangements for the past 2 months: [place] Expected Discharge Date: 07/10/20                                     Social Determinants of Health (SDOH) Interventions    Readmission Risk Interventions Readmission Risk Prevention Plan 07/10/2020  Transportation Screening Complete  HRI or [place] Complete  Social Work Consult for [place] Planning/Counseling Complete  Palliative Care Screening Not Applicable  Medication Review [employer]) Complete  Some recent data might be hidden

## 2020-08-14 NOTE — Progress Notes (Signed)
PROGRESS NOTE    Henry Smith  ZOX:[id number] DOB: 1941-02-08 DOA: 07/03/2020 PCP: Caprice Renshaw, MD    Brief Narrative:  79 year old male with a history of neurosyphilis, dementia, schizoaffective disorder, COPD, thrombocytopenia, seizure disorder, who is a long-term resident of a nursing facility, was brought to the hospital with progressive changes in mental status.  Patient had been coming increasingly aggressive, was refusing any medications, becoming paranoid.  More recently, staff had noted that he was drinking his urine.  He was evaluated in the emergency room and felt that he may have a urinary tract infection was admitted to the hospital service.  Further review of urine studies indicate that his positive urine culture is likely a colonization and not a true infection.  He did not have any other signs of active infection.  Discussed with infectious disease who agreed with not treating with antibiotics.  It was felt that his progressive decline in mental status was related to his underlying behavioral health.  Seen by psychiatry with recommendations for inpatient psychiatry.  He is involuntarily committed.  Patient is medically stable for discharge whenever he can be placed by psychiatry.  -Patient is independent with ADLs, ambulatory   Assessment & Plan:   Principal Problem:   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[place]) Active Problems:   Thrombocytopenia ([facility])   Schizoaffective disorder, bipolar type ([place])   Seizure ([place])   Noncompliance with medication regimen   Acute metabolic encephalopathy   1)Progressive Behavioral Changes---  Initially felt to be related to possible UTI, but patient likely has colonization and not a true urinary tract infection.  He has been refusing his medications for weeks to months.  He is on several psychotropic medications such as Depakote, Haldol, Ativan.  He also takes Lamictal and Keppra for seizures.  His behavior changes appear to be chronic  progressive in nature and appear to be secondary to his underlying mental illness.  Other work-up including CT imaging of head, basic chemistry, ammonia, TSH, ABG were unrevealing.  Appreciate psychiatry input.  Recommendations are for inpatient psychiatry.  He is currently under involuntary commitment.---- - Inpatient psychiatric admission to a geropsychiatric facility continues to be recommended -Patient is medically stable for discharge to inpatient psychiatric/behavioral unit when bed becomes available  --- Poor compliance with medications and persistent psychotic type behavior disturbance persist -Continue to follow psychiatry service for medications adjustment and further recommendations -Continue treatment as per Psychiatry recommendations.  After further eval by psych provider-- changes to pt's regimen----  --Depakote was dced bcos it is difficult to monitor levels (pt is not always cooperative with lab draws)--- --Scheduled--c/n Amantadine, Namenda, Haldol, Prozac , Clonidine patch and Tegretol  Prn-- Im Haldol and im Ativan ordered  2)HTN--BP is not at goal partly due to poor compliance with medications - -will continue oral amlodipine and clonidine patch  3)Dementia Associated with Neurosyphilis---  Review of records indicate that he was treated for neurosyphilis in 2018.   -He is chronically on Namenda which he has been refusing intermittently.  4)Thrombocytopenia/pancytopenia--patient with some degree of mild pancytopenia which is stable--  Appears to be a chronic issue. -No signs of overt bleeding.  5)Seizure disorder---- Chronically on Keppra and Lamictal.   --He has been refusing these medications.  He has IM Ativan as needed ordered if he has any breakthrough seizures.  6)Noncompliance----  Patient continues refusing medications intermittently.  DVT prophylaxis: heparin injection 5,000 Units Start: 07/04/20 0600 SCDs Start: 07/04/20 0130  Code Status: full code Family  Communication: No family  present Disposition Plan: Status is: Inpatient  Remains inpatient appropriate because:Unsafe d/c plan   Dispo: The patient is from: SNF              Anticipated d/c is to: Inpatient psychiatry              Anticipated d/c date is: 1 day              Patient currently is medically stable to d/c. ---- Inpatient psychiatric admission to a geropsychiatric facility continues to be recommended -Patient is medically stable for discharge to inpatient psychiatric/behavioral unit when bed becomes available.  Patient continued to be first priority on [facility].     Consultants:   Psychiatry  Procedures:   See below for x-ray reports.  Antimicrobials:   None currently (has completed antibiotics for UTI).  Subjective: Afebrile, no chest pain, no nausea, no vomiting, no episode of seizures and currently denying suicidal ideation.  No overnight events.  Continues to have intermittent episode of aggressive behavior, refusing medication and being difficult to redirect.   Objective: Vitals:   08/10/20 2239 08/11/20 0621 08/11/20 2204 08/11/20 2312  BP: (!) 164/77 (!) 153/83 139/81   Pulse: 65 65 72   Resp: 20 20 20    Temp: 98.1 F (36.7 C) 98.3 F (36.8 C) 98.4 F (36.9 C)   TempSrc: Oral Oral Oral   SpO2: 99% 99% 98%   Weight:    99 kg  Height:        Intake/Output Summary (Last 24 hours) at 08/14/2020 1610 Last data filed at 08/14/2020 0330 Gross per 24 hour  Intake 582 ml  Output 1 ml  Net 581 ml   Filed Weights   07/03/20 1504 07/04/20 0200 08/11/20 2312  Weight: 92.5 kg 97.3 kg 99 kg    Examination: General exam: Alert, awake, oriented x 2; no suicidal ideation and not overnight events.  Patient is afebrile, no chest pain, no nausea, no vomiting. Respiratory system: Clear to auscultation. Respiratory effort normal. Cardiovascular system: RRR. No murmurs, rubs, gallops; no JVD. Gastrointestinal system: Abdomen is nondistended, soft and  nontender. No organomegaly or masses felt. Normal bowel sounds heard. Central nervous system: Alert and oriented. No focal neurological deficits. Extremities: No edema, cyanosis or clubbing. Skin: No rashes, no petechiae Psychiatry: Appropriate mood and affect appreciated.    Radiology Studies: No results found.  Scheduled Meds: . amantadine  100 mg Oral BID  . amLODipine  5 mg Oral Daily  . carbamazepine  100 mg Oral QPC breakfast  . carbamazepine  200 mg Oral BID  . cloNIDine  0.2 mg Transdermal Weekly  . FLUoxetine  40 mg Oral Daily  . folic acid  1 mg Oral Daily  . haloperidol  2 mg Oral TID  . heparin  5,000 Units Subcutaneous Q8H  . levETIRAcetam  500 mg Oral BID  . memantine  10 mg Oral QHS  . [START ON 09/08/2020] paliperidone  234 mg Intramuscular Q28 days  . vitamin B-12  500 mcg Oral Daily   Continuous Infusions:   LOS: 41 days    -Patient is independent with ADLs, ambulatory  -Patient is medically stable for discharge to inpatient psychiatric/behavioral unit when bed becomes available  Barton Dubois, MD [facility]  If 7PM-7AM, please contact night-coverage [email or web address]  08/14/2020, 4:10 PM

## 2020-08-14 NOTE — BHH Counselor (Signed)
Pt remains at [facility] hall due to altered mental status.  Per intake, ''79 year old male with a history of neurosyphilis, dementia, schizoaffective disorder, COPD, thrombocytopenia, seizure disorder, who is a long-term resident of a nursing facility, was brought to the hospital with progressive changes in mental status. Patient had been coming increasingly aggressive, was refusing any medications, becoming paranoid. More recently, staff had noted that he was drinking his urine.''  Attempted reassessment.  Attempted to engage Pt, who stared at screen and did not respond.  Recommend continued inpatient.

## 2020-08-14 NOTE — Progress Notes (Signed)
Pt has an an uneventful shift for me today, No attempts of violence towards this RN. Did tell me he would only take non generic brand medication. This RN han d CN give pt medication and he tool without any problems

## 2020-08-15 NOTE — BH Assessment (Addendum)
Requesting nursing staff to place TTS machine in patients room for his reassessment.   The machine was placed in patient's room but he refused. Per nursing staff patient stated that he would refuse to talk. He also stated to staff, "Quit fucking with him and if we brought it  back in there would be serious consequences". Therefore, nursing staff removed the machine from patient's room. TTS unable to complete patient's reassessment.  This clinician was unable to determine if patient is experiencing SI, HI, and AVH's. Also, if he has had any mental health improvements and/or symptoms at this time.   Clinician asked nursing staff to notify if patient becomes more cooperative in order to complete his tele assessment today.   Mordecai Maes, NP, recommends continued inpatient treatment. Disposition LCSW will continue to seek placement at [facility].

## 2020-08-15 NOTE — Progress Notes (Signed)
PROGRESS NOTE    Henry Smith  WGN:[id number] DOB: [birth year] DOA: 07/03/2020 PCP: Caprice Renshaw, MD    Brief Narrative:  79 year old male with a history of neurosyphilis, dementia, schizoaffective disorder, COPD, thrombocytopenia, seizure disorder, who is a long-term resident of a nursing facility, was brought to the hospital with progressive changes in mental status.  Patient had been coming increasingly aggressive, was refusing any medications, becoming paranoid.  More recently, staff had noted that he was drinking his urine.  He was evaluated in the emergency room and felt that he may have a urinary tract infection was admitted to the hospital service.  Further review of urine studies indicate that his positive urine culture is likely a colonization and not a true infection.  He did not have any other signs of active infection.  Discussed with infectious disease who agreed with not treating with antibiotics.  It was felt that his progressive decline in mental status was related to his underlying behavioral health.  Seen by psychiatry with recommendations for inpatient psychiatry.  He is involuntarily committed.  Patient is medically stable for discharge whenever he can be placed by psychiatry.  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  He has been refusing his medications for weeks to months.  He is on several psychotropic medications such as Depakote, Haldol, Ativan.  He also takes Lamictal and Keppra for seizures.  His behavior changes appear to be chronic  progressive in nature and appear to be secondary to his underlying mental illness.  Other work-up including CT imaging of head, basic chemistry, ammonia, TSH, ABG were unrevealing.  Appreciate psychiatry input.  Recommendations are for inpatient psychiatry.  He is currently under involuntary commitment.---- - Inpatient psychiatric admission to a geropsychiatric facility continues to be recommended -Patient is medically stable for discharge to inpatient psychiatric/behavioral unit when bed becomes available  --- Poor compliance with medications and persistent psychotic type behavior disturbance persist -Continue to follow psychiatry service for medications adjustment and further recommendations -Continue treatment as per Psychiatry recommendations.  After further eval by psych provider-- changes to pt's regimen----  --Depakote was dced bcos it is difficult to monitor levels (pt is not always cooperative with lab draws)--- --Scheduled--c/n Amantadine, Namenda, Haldol, Prozac , Clonidine patch and Tegretol  Prn-- Im Haldol and im Ativan ordered  2)HTN--BP is not at goal partly due to poor compliance with medications - -will continue oral amlodipine and clonidine patch  3)Dementia Associated with Neurosyphilis---  Review of records indicate that he was treated for neurosyphilis in 2018.   -He is chronically on Namenda which he has been refusing intermittently.  4)Thrombocytopenia/pancytopenia--patient with some degree of mild pancytopenia which is stable--  Appears to be a chronic issue. -No signs of overt bleeding.  5)Seizure disorder---- Chronically on Keppra and Lamictal.   --He has been refusing these medications.  He has IM Ativan as needed ordered if he has any breakthrough seizures.  6)Noncompliance----  Patient continues refusing medications intermittently.  DVT prophylaxis: heparin injection 5,000 Units Start: 07/04/20 0600 SCDs Start: 07/04/20 0130  Code Status: full code Family  Communication: No family  present Disposition Plan: Status is: Inpatient  Remains inpatient appropriate because:Unsafe d/c plan   Dispo: The patient is from: SNF              Anticipated d/c is to: Inpatient psychiatry              Anticipated d/c date is: 1 day              Patient currently is medically stable to d/c. ---- Inpatient psychiatric admission to a geropsychiatric facility continues to be recommended -Patient is medically stable for discharge to inpatient psychiatric/behavioral unit when bed becomes available.  Patient continued to be first priority [facility].     Consultants:   Psychiatry  Procedures:   See below for x-ray reports.  Antimicrobials:   None currently (has completed antibiotics for UTI).  Subjective: No fever, no chest pain, no nausea, no vomiting, no episode of seizures or complaints of suicidal ideation at this time.  Overnight patient refused some of his medications.   Objective: Vitals:   08/11/20 0621 08/11/20 2204 08/11/20 2312 08/15/20 0611  BP: (!) 153/83 139/81  (!) 154/79  Pulse: 65 72  73  Resp: 20 20  16   Temp: 98.3 F (36.8 C) 98.4 F (36.9 C)    TempSrc: Oral Oral    SpO2: 99% 98%  99%  Weight:   99 kg   Height:        Intake/Output Summary (Last 24 hours) at 08/15/2020 1627 Last data filed at 08/15/2020 0852 Gross per 24 hour  Intake 480 ml  Output 5 ml  Net 475 ml   Filed Weights   07/03/20 1504 07/04/20 0200 08/11/20 2312  Weight: 92.5 kg 97.3 kg 99 kg    Examination: General exam: Alert, awake and answering questions appropriately my visit.  Denies suicidal ideation.  No overnight events reported.  Patient is afebrile, no nausea, no vomiting, no chest pain, no abdominal pain, no dysuria. Respiratory system: Clear to auscultation. Respiratory effort normal. Cardiovascular system:RRR. No murmurs, rubs, gallops. Gastrointestinal system: Abdomen is nondistended, soft and nontender. No organomegaly or  masses felt. Normal bowel sounds heard. Central nervous system: Alert and oriented. No focal neurological deficits. Extremities: No cyanosis or clubbing. Skin: No rashes, lesions or ulcers Psychiatry: Currently is not agitated; mood and affect appropriate at this time.   Radiology Studies: No results found.  Scheduled Meds: . amantadine  100 mg Oral BID  . amLODipine  5 mg Oral Daily  . carbamazepine  100 mg Oral QPC breakfast  . carbamazepine  200 mg Oral BID  . cloNIDine  0.2 mg Transdermal Weekly  . FLUoxetine  40 mg Oral Daily  . folic acid  1 mg Oral Daily  . haloperidol  2 mg Oral TID  . heparin  5,000 Units Subcutaneous Q8H  . levETIRAcetam  500 mg Oral BID  . memantine  10 mg Oral QHS  . [START ON 09/08/2020] paliperidone  234 mg Intramuscular Q28 days  . vitamin B-12  500 mcg Oral Daily   Continuous Infusions:   LOS: 42 days    -Patient is independent with ADLs, ambulatory  -Patient is medically stable for discharge to inpatient psychiatric/behavioral unit when bed becomes available  Barton Dubois, MD [facility]  If 7PM-7AM, please contact night-coverage [email or web address]  08/15/2020, 4:27 PM

## 2020-08-15 NOTE — Plan of Care (Signed)

## 2020-08-16 NOTE — BH Assessment (Signed)
Contacted nursing staff and requested machine to be placed in room for his TTS assessment. Nursing staff will locate machine and place in patient's room. TTS will be contacted by nursing staff once patient is ready to be seen.

## 2020-08-16 NOTE — BH Assessment (Signed)
Reassessment:   Upon review of patient's chart: "Patient is a 79 year old male with a history of neurosyphilis, dementia, schizoaffective disorder, COPD, thrombocytopenia, seizure disorder, who is a long-term resident of a nursing facility, was brought to the hospital with progressive changes in mental status. Patient had been coming increasingly aggressive, was refusing any medications, becoming paranoid. More recently, staff had noted that he was drinking his urine.''  Today, patient was sitting upright and cooperative with completing his re-assessment. Patient with his breakfast plate in front of him stating that he ate most of breakfast on on his plate. He reports feeling good today which is congruent with him smiling. He denies SI stating, "I want to live". He becomes flirtatious and inappropriate as we continue to talk. Patient asked about HI and states, "I would hurt a pretty face like yours". Patient carries on with making  Repeated comments such as "Getting between those thighs". His inappropriate comment was redirected by clinician.. When asked about auditory hallucinations he states, "I only hear your pretty voice". He denies visual hallucinations stating, "I only see your pretty smile". He reported that he has not slept well but has received plenty of rest. Patient asking to return back to his nursing home. States, "I don't want to go to a new home". Discussed with patient is recommendations for inpatient treatment and that Social Work was working diligently to get his placed.   Patient refused to participate in TTS assessment yesterday. Today, patient was able to engage in today's conversation. He was alert and oriented to person and place. Not to time and situation. Affect was appropriate. Mood was congruent with affect. His speech was appropriate. Insight and judgement are poor.

## 2020-08-16 NOTE — Progress Notes (Signed)
PROGRESS NOTE    Henry Smith  YQM:[id number] DOB: May 28, 1941 DOA: 07/03/2020 PCP: Caprice Renshaw, MD    Brief Narrative:  79 year old male with a history of neurosyphilis, dementia, schizoaffective disorder, COPD, thrombocytopenia, seizure disorder, who is a long-term resident of a nursing facility, was brought to the hospital with progressive changes in mental status.  Patient had been coming increasingly aggressive, was refusing any medications, becoming paranoid.  More recently, staff had noted that he was drinking his urine.  He was evaluated in the emergency room and felt that he may have a urinary tract infection was admitted to the hospital service.  Further review of urine studies indicate that his positive urine culture is likely a colonization and not a true infection.  He did not have any other signs of active infection.  Discussed with infectious disease who agreed with not treating with antibiotics.  It was felt that his progressive decline in mental status was related to his underlying behavioral health.  Seen by psychiatry with recommendations for inpatient psychiatry.  He is involuntarily committed.  Patient is medically stable for discharge whenever he can be placed by psychiatry.  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  He has been refusing his medications for weeks to months.  He is on several psychotropic medications such as Depakote, Haldol, Ativan.  He also takes Lamictal and Keppra for seizures.  His behavior changes appear to be chronic  progressive in nature and appear to be secondary to his underlying mental illness.  Other work-up including CT imaging of head, basic chemistry, ammonia, TSH, ABG were unrevealing.  Appreciate psychiatry input.  Recommendations are for inpatient psychiatry.  He is currently under involuntary commitment.---- - Inpatient psychiatric admission to a geropsychiatric facility continues to be recommended -Patient is medically stable for discharge to inpatient psychiatric/behavioral unit when bed becomes available  --- Poor compliance with medications and persistent psychotic type behavior disturbance persist -Continue to follow psychiatry service for medications adjustment and further recommendations -Continue treatment as per Psychiatry recommendations.  After further eval by psych provider-- changes to pt's regimen----  --Depakote was dced bcos it is difficult to monitor levels (pt is not always cooperative with lab draws)--- --Scheduled--c/n Amantadine, Namenda, Haldol, Prozac , Clonidine patch and Tegretol  Prn-- Im Haldol and im Ativan ordered  2)HTN--BP is not at goal partly due to poor compliance with medications - -will continue oral amlodipine and clonidine patch  3)Dementia Associated with Neurosyphilis---  Review of records indicate that he was treated for neurosyphilis in 2018.   -He is chronically on Namenda which he has been refusing intermittently.  4)Thrombocytopenia/pancytopenia--patient with some degree of mild pancytopenia which is stable--  Appears to be a chronic issue. -No signs of overt bleeding.  5)Seizure disorder---- Chronically on Keppra and Lamictal.   --He has been refusing these medications.  He has IM Ativan as needed ordered if he has any breakthrough seizures.  6)Noncompliance----  Patient continues refusing medications intermittently.  DVT prophylaxis: heparin injection 5,000 Units Start: 07/04/20 0600 SCDs Start: 07/04/20 0130  Code Status: full code Family  Communication: No family  present Disposition Plan: Status is: Inpatient  Remains inpatient appropriate because:Unsafe d/c plan   Dispo: The patient is from: SNF              Anticipated d/c is to: Inpatient psychiatry              Anticipated d/c date is: 1 day              Patient currently is medically stable to d/c. ---- Inpatient psychiatric admission to a geropsychiatric facility continues to be recommended -Patient is medically stable for discharge to inpatient psychiatric/behavioral unit when bed becomes available.  Patient continued to be first priority on waiting list at [facility].     Consultants:   Psychiatry  Procedures:   See below for x-ray reports.  Antimicrobials:   None currently (has completed antibiotics for UTI).  Subjective: No fever, no chest pain, no nausea, no vomiting.  Intermittent agitation, paranoid behavior, threatening and refusing meds.   Objective: Vitals:   08/15/20 0611 08/15/20 2110 08/16/20 0533 08/16/20 1626  BP: (!) 154/79 (!) 152/89 (!) 170/95 (!) 146/87  Pulse: 73 77 66   Resp: 16 17 16    Temp:  98 F (36.7 C) 98.1 F (36.7 C)   TempSrc:      SpO2: 99% 99% 100%   Weight:      Height:        Intake/Output Summary (Last 24 hours) at 08/16/2020 1735 Last data filed at 08/16/2020 0900 Gross per 24 hour  Intake 240 ml  Output --  Net 240 ml   Filed Weights   07/03/20 1504 07/04/20 0200 08/11/20 2312  Weight: 92.5 kg 97.3 kg 99 kg    Examination: General exam: Alert, awake, oriented x 2; today he has been intermittently agitated, refusing medications and expressing threat/inappropriate behavior.  No restraints has been required. Respiratory system: Clear to auscultation. Respiratory effort normal. Cardiovascular system:RRR. No murmurs, rubs, gallops. Gastrointestinal system: Abdomen is nondistended, soft and nontender. No organomegaly or masses felt. Normal bowel sounds heard. Central nervous system: Alert and oriented. No focal  neurological deficits. Extremities: No C/C/E, +pedal pulses Skin: No rashes, lesions or ulcers Psychiatry: Judgement and insight continue to be poor; some paranoid behavior and intermittent agitation appreciated.  Radiology Studies: No results found.  Scheduled Meds: . amantadine  100 mg Oral BID  . amLODipine  5 mg Oral Daily  . carbamazepine  100 mg Oral QPC breakfast  . carbamazepine  200 mg Oral BID  . cloNIDine  0.2 mg Transdermal Weekly  . FLUoxetine  40 mg Oral Daily  . folic acid  1 mg Oral Daily  . haloperidol  2 mg Oral TID  . heparin  5,000 Units Subcutaneous Q8H  . levETIRAcetam  500 mg Oral BID  . memantine  10 mg Oral QHS  . [START ON 09/08/2020] paliperidone  234 mg Intramuscular Q28 days  . vitamin B-12  500 mcg Oral Daily   Continuous Infusions:   LOS: 43 days    -Patient is independent with ADLs, ambulatory  -Patient is medically stable for discharge to inpatient psychiatric/behavioral unit when bed becomes available  Barton Dubois, MD [facility]  If 7PM-7AM, please contact night-coverage [email or web address]  08/16/2020, 5:35 PM

## 2020-08-16 NOTE — Progress Notes (Signed)
CSW spoke with admissions staff at [facility] [phone]). She reports that she has not been notified that there are any open geriatric beds today at this time. [facility] medical director will be notified.   Audree Camel, MSW, LCSW, [employer] Clinical Social Worker II Disposition CSW [phone]

## 2020-08-16 NOTE — Care Management Important Message (Signed)
Important Message  Patient Details  Name: Henry Smith MRN: [id number] Date of Birth: Nov 30, 1941   Medicare Important Message Given:  Yes (mailed to [facility] at [address], [place], [place])     Tommy Medal 08/16/2020, 3:40 PM

## 2020-08-16 NOTE — Plan of Care (Signed)

## 2020-08-17 NOTE — Plan of Care (Signed)

## 2020-08-17 NOTE — Progress Notes (Signed)
Per Lov[place] with [facility] this patient has been placed on their waitlist.    Darletta Moll MSW, [facility]

## 2020-08-17 NOTE — Progress Notes (Signed)
1740: Pt walked back out of his room. Attempted to stop the pt, but the pt continued to ask for a walk. This writer attempted to redirect pt, but pt became very agitated. Pt got in the face of this Probation officer. Robin NT from the floor pressed the duress button, having security and other techs come. Pt then sat out on the hallway. Pt's RN then offered a mountain dew. Pt continued to sit. Security arrived, and pt walked right back into is room.   1753: Pt stood back up and began yelling at this writer about wanting to leave the room, told pt that we could not leave the room. Instead he could eat his dinner and take a sip of his mountain dew. Another tech on the floor offered pt an ice popsicle. Pt went right into room. RN aware that pt is having a small episode in which he is acting out. Has already received his medicine within his mountain dew, due to previous failed attempts with pt not taking his medicine. Will continue to monitor pt.

## 2020-08-17 NOTE — Progress Notes (Signed)
1805: Pt kept walking out, despite having someone taking to him in his room. Pt then walked out to the rest of the unit, with this Probation officer and another tech. Pt redirected back into his room, but pt became very violent. Throwing fists to this Probation officer and the other tech. Pt then walked to the nearest fire exit. Attempted to stop pt once more. Pt then swung his fists to both this Probation officer and other tech. Pt attempted to elope by going through another door leading to another unit. Duress button pressed, RN arrived and then attempted to bring pt back into room. Pt went back into room after several attempts of calming down. Security arrived, pt now in room sitting on his chair. Security at the door. RN aware of pt's actions all afternoon. This Probation officer will continue to monitor pt.

## 2020-08-17 NOTE — Plan of Care (Signed)
  Problem: Education: Goal: Knowledge of General Education information will improve Description: Including pain rating scale, medication(s)/side effects and non-pharmacologic comfort measures 08/17/2020 0215 by Baldemar Friday, RN Outcome: Progressing 08/17/2020 0214 by Baldemar Friday, RN Outcome: Progressing   Problem: Health Behavior/Discharge Planning: Goal: Ability to manage health-related needs will improve 08/17/2020 0215 by Baldemar Friday, RN Outcome: Progressing 08/17/2020 0214 by Baldemar Friday, RN Outcome: Progressing   Problem: Clinical Measurements: Goal: Ability to maintain clinical measurements within normal limits will improve 08/17/2020 0215 by Baldemar Friday, RN Outcome: Progressing 08/17/2020 0214 by Baldemar Friday, RN Outcome: Progressing Goal: Will remain free from infection 08/17/2020 0215 by Baldemar Friday, RN Outcome: Progressing 08/17/2020 0214 by Baldemar Friday, RN Outcome: Progressing Goal: Diagnostic test results will improve 08/17/2020 0215 by Baldemar Friday, RN Outcome: Progressing 08/17/2020 0214 by Baldemar Friday, RN Outcome: Progressing Goal: Respiratory complications will improve 08/17/2020 0215 by Baldemar Friday, RN Outcome: Progressing 08/17/2020 0214 by Baldemar Friday, RN Outcome: Progressing Goal: Cardiovascular complication will be avoided 08/17/2020 0215 by Baldemar Friday, RN Outcome: Progressing 08/17/2020 0214 by Baldemar Friday, RN Outcome: Progressing   Problem: Activity: Goal: Risk for activity intolerance will decrease 08/17/2020 0215 by Baldemar Friday, RN Outcome: Progressing 08/17/2020 0214 by Baldemar Friday, RN Outcome: Progressing   Problem: Nutrition: Goal: Adequate nutrition will be maintained 08/17/2020 0215 by Baldemar Friday, RN Outcome: Progressing 08/17/2020 0214 by Baldemar Friday, RN Outcome: Progressing   Problem: Coping: Goal: Level of anxiety will decrease 08/17/2020 0215 by Baldemar Friday, RN Outcome: Progressing 08/17/2020 0214 by Baldemar Friday, RN Outcome: Progressing   Problem: Elimination: Goal: Will not experience complications related to bowel motility 08/17/2020 0215 by Baldemar Friday, RN Outcome: Progressing 08/17/2020 0214 by Baldemar Friday, RN Outcome: Progressing Goal: Will not experience complications related to urinary retention 08/17/2020 0215 by Baldemar Friday, RN Outcome: Progressing 08/17/2020 0214 by Baldemar Friday, RN Outcome: Progressing   Problem: Pain Managment: Goal: General experience of comfort will improve 08/17/2020 0215 by Baldemar Friday, RN Outcome: Progressing 08/17/2020 0214 by Baldemar Friday, RN Outcome: Progressing   Problem: Safety: Goal: Ability to remain free from injury will improve 08/17/2020 0215 by Baldemar Friday, RN Outcome: Progressing 08/17/2020 0214 by Baldemar Friday, RN Outcome: Progressing   Problem: Skin Integrity: Goal: Risk for impaired skin integrity will decrease 08/17/2020 0215 by Baldemar Friday, RN Outcome: Progressing 08/17/2020 0214 by Baldemar Friday, RN Outcome: Progressing

## 2020-08-17 NOTE — Progress Notes (Signed)
PROGRESS NOTE    Henry Smith  ZOX:[id number] DOB: 11-Jan-1941 DOA: 07/03/2020 PCP: Caprice Renshaw, MD    Brief Narrative:  79 year old male with a history of neurosyphilis, dementia, schizoaffective disorder, COPD, thrombocytopenia, seizure disorder, who is a long-term resident of a nursing facility, was brought to the hospital with progressive changes in mental status.  Patient had been coming increasingly aggressive, was refusing any medications, becoming paranoid.  More recently, staff had noted that he was drinking his urine.  He was evaluated in the emergency room and felt that he may have a urinary tract infection was admitted to the hospital service.  Further review of urine studies indicate that his positive urine culture is likely a colonization and not a true infection.  He did not have any other signs of active infection.  Discussed with infectious disease who agreed with not treating with antibiotics.  It was felt that his progressive decline in mental status was related to his underlying behavioral health.  Seen by psychiatry with recommendations for inpatient psychiatry.  He is involuntarily committed.  Patient is medically stable for discharge whenever he can be placed by psychiatry.  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  He has been refusing his medications for weeks to months.  He is on several psychotropic medications such as Depakote, Haldol, Ativan.  He also takes Lamictal and Keppra for seizures.  His behavior changes appear to be chronic  progressive in nature and appear to be secondary to his underlying mental illness.  Other work-up including CT imaging of head, basic chemistry, ammonia, TSH, ABG were unrevealing.  Appreciate psychiatry input.  Recommendations are for inpatient psychiatry.  He is currently under involuntary commitment.---- - Inpatient psychiatric admission to a geropsychiatric facility continues to be recommended -Patient is medically stable for discharge to inpatient psychiatric/behavioral unit when bed becomes available  --- Poor compliance with medications and persistent psychotic type behavior disturbance persist -Continue to follow psychiatry service for medications adjustment and further recommendations -Continue treatment as per Psychiatry recommendations.  After further eval by psych provider-- changes to pt's regimen----  --Depakote was dced bcos it is difficult to monitor levels (pt is not always cooperative with lab draws)--- --Scheduled--c/n Amantadine, Namenda, Haldol, Prozac , Clonidine patch and Tegretol  Prn-- Im Haldol and im Ativan ordered  2)HTN--BP is not at goal partly due to poor compliance with medications - -will continue oral amlodipine and clonidine patch  3)Dementia Associated with Neurosyphilis---  Review of records indicate that he was treated for neurosyphilis in 2018.   -He is chronically on Namenda which he has been refusing intermittently.  4)Thrombocytopenia/pancytopenia--patient with some degree of mild pancytopenia which is stable--  Appears to be a chronic issue. -No signs of overt bleeding.  5)Seizure disorder---- Chronically on Keppra and Lamictal.   --He has been refusing these medications.  He has IM Ativan as needed ordered if he has any breakthrough seizures.  6)Noncompliance----  Patient continues refusing medications intermittently.  DVT prophylaxis: heparin injection 5,000 Units Start: 07/04/20 0600 SCDs Start: 07/04/20 0130  Code Status: full code Family  Communication: No family  present Disposition Plan: Status is: Inpatient  Remains inpatient appropriate because:Unsafe d/c plan   Dispo: The patient is from: SNF              Anticipated d/c is to: Inpatient psychiatry              Anticipated d/c date is: 1 day              Patient currently is medically stable to d/c. ---- Inpatient psychiatric admission to a geropsychiatric facility continues to be recommended -Patient is medically stable for discharge to inpatient psychiatric/behavioral unit when bed becomes available.  Patient continued to be first priority on [facility] list at [facility].     Consultants:   Psychiatry  Procedures:   See below for x-ray reports.  Antimicrobials:   None currently (has completed antibiotics for UTI).  Subjective: No fever, no chest pain, no nausea, no vomiting.  Oriented x2.  In no acute distress currently.   Objective: Vitals:   08/16/20 0533 08/16/20 1626 08/16/20 1948 08/17/20 1427  BP: (!) 170/95 (!) 146/87 120/65 120/64  Pulse: 66  86 (!) 103  Resp: 16  16 19   Temp: 98.1 F (36.7 C)  (!) 97.4 F (36.3 C) 98.3 F (36.8 C)  TempSrc:   Oral   SpO2: 100%  99% 98%  Weight:      Height:        Intake/Output Summary (Last 24 hours) at 08/17/2020 1536 Last data filed at 08/16/2020 2349 Gross per 24 hour  Intake 1060 ml  Output --  Net 1060 ml   Filed Weights   07/03/20 1504 07/04/20 0200 08/11/20 2312  Weight: 92.5 kg 97.3 kg 99 kg    Examination: General exam: Alert, awake, oriented x 2 (did not remember the year); no fever, no chest pain, no nausea, no vomiting.  No restraints has been required.  No overnight events reported. Respiratory system: Clear to auscultation. Respiratory effort normal. Cardiovascular system:RRR. No murmurs, rubs, gallops. Gastrointestinal system: Abdomen is nondistended, soft and nontender. No organomegaly or masses felt. Normal bowel sounds heard. Central nervous system: Alert and oriented. No focal  neurological deficits. Extremities: No C/C/E, +pedal pulses Skin: No rashes, lesions or ulcers Psychiatry: Judgement and insight continue to be poor.  Intermittent episode of paranoid behavior, agitation and medication refusal persist per nursing report.    Radiology Studies: No results found.  Scheduled Meds: . amantadine  100 mg Oral BID  . amLODipine  5 mg Oral Daily  . carbamazepine  100 mg Oral QPC breakfast  . carbamazepine  200 mg Oral BID  . cloNIDine  0.2 mg Transdermal Weekly  . FLUoxetine  40 mg Oral Daily  . folic acid  1 mg Oral Daily  . haloperidol  2 mg Oral TID  . heparin  5,000 Units Subcutaneous Q8H  . levETIRAcetam  500 mg Oral BID  . memantine  10 mg Oral QHS  . [START ON 09/08/2020] paliperidone  234 mg Intramuscular Q28 days  . vitamin B-12  500 mcg Oral Daily   Continuous Infusions:   LOS: 44 days    -Patient is independent with ADLs, ambulatory  -Patient is medically stable for discharge to inpatient psychiatric/behavioral unit when bed becomes available  Barton Dubois, MD [facility]  If 7PM-7AM, please contact night-coverage [email or web address]  08/17/2020, 3:36 PM

## 2020-08-17 NOTE — Progress Notes (Signed)
1605: Pt walked out of room. This sitter attempted to stop pt. Pt did not want to go back to room. Another Tech took pt for a walk around the unit for a "smoke break". Pt brought back into room.   1610: Pt walked back out of the room and asked to go for a walk. This writer told pt that a walk could not be done as he is not allowed too due to pt eloping before. Pt became agitated, walked to another room and attempted to leave through a fire exit. This Probation officer asked for security and this Probation officer stopped the pt before he could walk out. Pt was then walked back into his room. Security is now beside the patients door.   1625: Pt will occasionally walk to the door and look outside, but will be redirected to stay in his room. Will continue to monitor pt.

## 2020-08-18 NOTE — Progress Notes (Signed)
PROGRESS NOTE    Henry Smith  [id number]:[id number] DOB: 1941/07/26 DOA: 07/03/2020 PCP: Caprice Renshaw, MD    Brief Narrative:  79 year old male with a history of neurosyphilis, dementia, schizoaffective disorder, COPD, thrombocytopenia, seizure disorder, who is a long-term resident of a nursing facility, was brought to the hospital with progressive changes in mental status.  Patient had been coming increasingly aggressive, was refusing any medications, becoming paranoid.  More recently, staff had noted that he was drinking his urine.  He was evaluated in the emergency room and felt that he may have a urinary tract infection was admitted to the hospital service.  Further review of urine studies indicate that his positive urine culture is likely a colonization and not a true infection.  He did not have any other signs of active infection.  Discussed with infectious disease who agreed with not treating with antibiotics.  It was felt that his progressive decline in mental status was related to his underlying behavioral health.  Seen by psychiatry with recommendations for inpatient psychiatry.  He is involuntarily committed.  Patient is medically stable for discharge whenever he can be placed by psychiatry.  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  He has been refusing his medications for weeks to months.  He is on several psychotropic medications such as Depakote, Haldol, Ativan.  He also takes Lamictal and Keppra for seizures.  His behavior changes appear to be chronic  progressive in nature and appear to be secondary to his underlying mental illness.  Other work-up including CT imaging of head, basic chemistry, ammonia, TSH, ABG were unrevealing.  Appreciate psychiatry input.  Recommendations are for inpatient psychiatry.  He is currently under involuntary commitment.---- - Inpatient psychiatric admission to a geropsychiatric facility continues to be recommended -Patient is medically stable for discharge to inpatient psychiatric/behavioral unit when bed becomes available  --- Poor compliance with medications and persistent psychotic type behavior disturbance persist -Continue to follow psychiatry service for medications adjustment and further recommendations -Continue treatment as per Psychiatry recommendations.  After further eval by psych provider-- changes to pt's regimen----  --Depakote was dced bcos it is difficult to monitor levels (pt is not always cooperative with lab draws)--- --Scheduled--c/n Amantadine, Namenda, Haldol, Prozac , Clonidine patch and Tegretol  Prn-- Im Haldol and im Ativan ordered  2)HTN--BP is not at goal partly due to poor compliance with medications - -will continue oral amlodipine and clonidine patch  3)Dementia Associated with Neurosyphilis---  Review of records indicate that he was treated for neurosyphilis in 2018.   -He is chronically on Namenda which he has been refusing intermittently.  4)Thrombocytopenia/pancytopenia--patient with some degree of mild pancytopenia which is stable--  Appears to be a chronic issue. -No signs of overt bleeding.  5)Seizure disorder---- Chronically on Keppra and Lamictal.   --He has been refusing these medications.  He has IM Ativan as needed ordered if he has any breakthrough seizures.  6)Noncompliance----  Patient continues refusing medications intermittently.  DVT prophylaxis: heparin injection 5,000 Units Start: 07/04/20 0600 SCDs Start: 07/04/20 0130  Code Status: full code Family  Communication: No family  present Disposition Plan: Status is: Inpatient  Remains inpatient appropriate because:Unsafe d/c plan   Dispo: The patient is from: SNF              Anticipated d/c is to: Inpatient psychiatry              Anticipated d/c date is: 1 day              Patient currently is medically stable to d/c. ---- Inpatient psychiatric admission to a geropsychiatric facility continues to be recommended -Patient is medically stable for discharge to inpatient psychiatric/behavioral unit when bed becomes available.  Patient continued to be first priority on waiting list at [facility].     Consultants:   Psychiatry  Procedures:   See below for x-ray reports.  Antimicrobials:   None currently (has completed antibiotics for UTI).  Subjective: No fever, no chest pain, no nausea, no vomiting.  Expressed feeling okay.  No agitation at this time. In Good spirit after taking a shower.   Objective: Vitals:   08/16/20 1626 08/16/20 1948 08/17/20 1427 08/18/20 0300  BP: (!) 146/87 120/65 120/64 (!) 153/90  Pulse:  86 (!) 103 (!) 104  Resp:  16 19 19   Temp:  (!) 97.4 F (36.3 C) 98.3 F (36.8 C) 98.7 F (37.1 C)  TempSrc:  Oral  Oral  SpO2:  99% 98% 99%  Weight:      Height:        Intake/Output Summary (Last 24 hours) at 08/18/2020 1043 Last data filed at 08/17/2020 2115 Gross per 24 hour  Intake 480 ml  Output --  Net 480 ml   Filed Weights   07/03/20 1504 07/04/20 0200 08/11/20 2312  Weight: 92.5 kg 97.3 kg 99 kg    Examination: General exam: Alert, awake, oriented x 2; currently calm; no fever, no chest pain, no nausea, no vomiting.  Nursing staff reported intermittent episodes overnight of agitation and violent behavior throwing fists to staff. Respiratory system: Clear to auscultation. Respiratory effort normal. Cardiovascular system:RRR. No murmurs, rubs, gallops. Gastrointestinal system: Abdomen is nondistended, soft and nontender. No organomegaly or masses  felt. Normal bowel sounds heard. Central nervous system: Alert and oriented. No focal neurological deficits. Extremities: No C/C/E, +pedal pulses Skin: No rashes, lesions or ulcers Psychiatry: Judgment and insight continue to be poor; patient behavior continue to fluctuates.  Currently no distress.   Radiology Studies: No results found.  Scheduled Meds: . amantadine  100 mg Oral BID  . amLODipine  5 mg Oral Daily  . carbamazepine  100 mg Oral QPC breakfast  . carbamazepine  200 mg Oral BID  . cloNIDine  0.2 mg Transdermal Weekly  . FLUoxetine  40 mg Oral Daily  . folic acid  1 mg Oral Daily  . haloperidol  2 mg Oral TID  . heparin  5,000 Units Subcutaneous Q8H  . levETIRAcetam  500 mg Oral BID  . memantine  10 mg Oral QHS  . [START ON 09/08/2020] paliperidone  234 mg Intramuscular Q28 days  . vitamin B-12  500 mcg Oral Daily   Continuous Infusions:   LOS: 45 days    -Patient is independent with ADLs, ambulatory  -Patient is medically stable for discharge to inpatient psychiatric/behavioral unit when bed becomes available  Barton Dubois, MD [facility]  If 7PM-7AM, please contact night-coverage [email or web address]  08/18/2020, 10:43 AM

## 2020-08-19 MED ORDER — NICOTINE 7 MG/24HR TD PT24
7.0000 mg | MEDICATED_PATCH | Freq: Every day | TRANSDERMAL | Status: DC
  Administered 2020-08-19 – 2020-09-07 (×12): 7 mg via TRANSDERMAL
  Filled 2020-08-19 (×17): qty 1

## 2020-08-19 NOTE — BH Assessment (Signed)
Contacted [facility] and confirmed with Baxter Flattery @ [phone] that patient remains on the [place] priority wait list. Also, contacted [facility] and confirmed with [employer] that patient is still on the wait list. No expected discharges today for either facility.

## 2020-08-19 NOTE — BH Assessment (Signed)
Reassessment Note:  Per RN, pt is unable to participate in assessment due to taking Geodon earlier today. Per MD, pt is medically stable for admission to gero-psychiatric facility.  Pt continues to be on [facility] priority list.

## 2020-08-19 NOTE — Progress Notes (Signed)
Pt coming out of room several times being physically aggressive with staff when redirected. Medicated with no change, notified Dr. Olevia Bowens to make aware, ok for soft wrist restraints. Restraints ordered and applied at 2240. Will continue to monitor.

## 2020-08-19 NOTE — Progress Notes (Signed)
PROGRESS NOTE    Henry Smith  [id number]:[id number] DOB: 1941/10/19 DOA: 07/03/2020 PCP: Caprice Renshaw, MD    Brief Narrative:  79 year old male with a history of neurosyphilis, dementia, schizoaffective disorder, COPD, thrombocytopenia, seizure disorder, who is a long-term resident of a nursing facility, was brought to the hospital with progressive changes in mental status.  Patient had been coming increasingly aggressive, was refusing any medications, becoming paranoid.  More recently, staff had noted that he was drinking his urine.  He was evaluated in the emergency room and felt that he may have a urinary tract infection was admitted to the hospital service.  Further review of urine studies indicate that his positive urine culture is likely a colonization and not a true infection.  He did not have any other signs of active infection.  Discussed with infectious disease who agreed with not treating with antibiotics.  It was felt that his progressive decline in mental status was related to his underlying behavioral health.  Seen by psychiatry with recommendations for inpatient psychiatry.  He is involuntarily committed.  Patient is medically stable for discharge whenever he can be placed by psychiatry.  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  He has been refusing his medications for weeks to months.  He is on several psychotropic medications such as Depakote, Haldol, Ativan.  He also takes Lamictal and Keppra for seizures.  His behavior changes appear to be chronic  progressive in nature and appear to be secondary to his underlying mental illness.  Other work-up including CT imaging of head, basic chemistry, ammonia, TSH, ABG were unrevealing.  Appreciate psychiatry input.  Recommendations are for inpatient psychiatry.  He is currently under involuntary commitment.---- - Inpatient psychiatric admission to a geropsychiatric facility continues to be recommended -Patient is medically stable for discharge to inpatient psychiatric/behavioral unit when bed becomes available  --- Poor compliance with medications and persistent psychotic type behavior disturbance persist -Continue to follow psychiatry service for medications adjustment and further recommendations -Continue treatment as per Psychiatry recommendations.  After further eval by psych provider-- changes to pt's regimen----  --Depakote was dced bcos it is difficult to monitor levels (pt is not always cooperative with lab draws)--- --Scheduled--c/n Amantadine, Namenda, Haldol, Prozac , Clonidine patch and Tegretol  Prn-- Im Haldol and im Ativan ordered  2)HTN--BP is not at goal partly due to poor compliance with medications - -will continue oral amlodipine and clonidine patch  3)Dementia Associated with Neurosyphilis---  Review of records indicate that he was treated for neurosyphilis in 2018.   -He is chronically on Namenda which he has been refusing intermittently.  4)Thrombocytopenia/pancytopenia--patient with some degree of mild pancytopenia which is stable--  Appears to be a chronic issue. -No signs of overt bleeding.  5)Seizure disorder---- Chronically on Keppra and Lamictal.   --He has been refusing these medications.  He has IM Ativan as needed ordered if he has any breakthrough seizures.  6)Noncompliance----  Patient continues refusing medications intermittently.  DVT prophylaxis: heparin injection 5,000 Units Start: 07/04/20 0600 SCDs Start: 07/04/20 0130  Code Status: full code Family  Communication: No family  present Disposition Plan: Status is: Inpatient  Remains inpatient appropriate because:Unsafe d/c plan   Dispo: The patient is from: SNF              Anticipated d/c is to: Inpatient psychiatry              Anticipated d/c date is: when ever there is a bed at psych facility.               Patient currently is medically stable to d/c. ---- Inpatient psychiatric admission to a geropsychiatric facility continues to be recommended -Patient is medically stable for discharge to inpatient psychiatric/behavioral unit when bed becomes available.  Patient continued to be first priority on waiting list at [facility].     Consultants:   Psychiatry  Procedures:   See below for x-ray reports.  Antimicrobials:   None currently (has completed antibiotics for UTI).  Subjective: No fever, no chest pain, no nausea, no vomiting.  No acute distress.   Objective: Vitals:   08/16/20 1948 08/17/20 1427 08/18/20 0300 08/18/20 2020  BP: 120/65 120/64 (!) 153/90 134/82  Pulse: 86 (!) 103 (!) 104 87  Resp: 16 19 19 20   Temp: (!) 97.4 F (36.3 C) 98.3 F (36.8 C) 98.7 F (37.1 C)   TempSrc: Oral  Oral   SpO2: 99% 98% 99% 100%  Weight:      Height:        Intake/Output Summary (Last 24 hours) at 08/19/2020 1234 Last data filed at 08/18/2020 1700 Gross per 24 hour  Intake 240 ml  Output --  Net 240 ml   Filed Weights   07/03/20 1504 07/04/20 0200 08/11/20 2312  Weight: 92.5 kg 97.3 kg 99 kg    Examination: General exam: Alert, awake, oriented x 2, calm and in no acute distress.  No overnight events. Respiratory system: Clear to auscultation. Respiratory effort normal. Cardiovascular system:RRR. No murmurs, rubs, gallops. Gastrointestinal system: Abdomen is nondistended, soft and nontender. No organomegaly or masses felt. Normal bowel sounds heard. Central nervous system: Alert and oriented. No focal neurological deficits. Extremities: No C/C/E, +pedal pulses Skin: No  rashes, lesions or ulcers Psychiatry: Judgement and insight remains to be poor; patient behavior continue to fluctuates intermittently.    Radiology Studies: No results found.  Scheduled Meds: . amantadine  100 mg Oral BID  . amLODipine  5 mg Oral Daily  . carbamazepine  100 mg Oral QPC breakfast  . carbamazepine  200 mg Oral BID  . cloNIDine  0.2 mg Transdermal Weekly  . FLUoxetine  40 mg Oral Daily  . folic acid  1 mg Oral Daily  . haloperidol  2 mg Oral TID  . heparin  5,000 Units Subcutaneous Q8H  . levETIRAcetam  500 mg Oral BID  . memantine  10 mg Oral QHS  . [START ON 09/08/2020] paliperidone  234 mg Intramuscular Q28 days  . vitamin B-12  500 mcg Oral Daily   Continuous Infusions:   LOS: 46 days    -Patient is independent with ADLs, ambulatory  -Patient is medically stable for discharge to inpatient psychiatric/behavioral unit when bed becomes available  Barton Dubois, MD [facility]  If 7PM-7AM, please contact night-coverage [email or web address]  08/19/2020, 12:34 PM

## 2020-08-20 NOTE — BH Assessment (Addendum)
Contacted [facility] and confirmed with Kim @ [phone]  that patient remains on the [place] priority wait list.   Also, contacted [facility] and confirmed with John @ [phone] that patient is still on the wait list.  No expected discharges today for either facility.

## 2020-08-20 NOTE — TOC Progression Note (Signed)
Transition of Care [facility]) - Progression Note    Patient Details  Name: Henry Smith MRN: [id number] Date of Birth: 30-Oct-1941  Transition of [facility]) CM/SW Contact  Shade Flood, LCSW Phone Number: 08/20/2020, 4:28 PM  Clinical Narrative:     MD completed re-evaluation and IVC paperwork. TOC faxed to Magistrate and called to inform. Magistrate indicated he would review and give to [employer] to serve patient.  TOC will follow and continue to await placement at [facility].  Expected Discharge Plan: Psychiatric Hospital Barriers to Discharge: Psych Bed not available  Expected Discharge Plan and Services Expected Discharge Plan: Psychiatric Hospital In-house Referral: Clinical Social Work     Living arrangements for the past 2 months: [place] Expected Discharge Date: 07/10/20                                     Social Determinants of Health (SDOH) Interventions    Readmission Risk Interventions Readmission Risk Prevention Plan 07/10/2020  Transportation Screening Complete  HRI or [place] Complete  Social Work Consult for [place] Planning/Counseling Complete  Palliative Care Screening Not Applicable  Medication Review [employer]) Complete  Some recent data might be hidden

## 2020-08-20 NOTE — Progress Notes (Signed)
PROGRESS NOTE    Henry Smith  ZHY:[id number] DOB: 10-17-1941 DOA: 07/03/2020 PCP: Caprice Renshaw, MD    Brief Narrative:  79 year old male with a history of neurosyphilis, dementia, schizoaffective disorder, COPD, thrombocytopenia, seizure disorder, who is a long-term resident of a nursing facility, was brought to the hospital with progressive changes in mental status.  Patient had been coming increasingly aggressive, was refusing any medications, becoming paranoid.  More recently, staff had noted that he was drinking his urine.  He was evaluated in the emergency room and felt that he may have a urinary tract infection was admitted to the hospital service.  Further review of urine studies indicate that his positive urine culture is likely a colonization and not a true infection.  He did not have any other signs of active infection.  Discussed with infectious disease who agreed with not treating with antibiotics.  It was felt that his progressive decline in mental status was related to his underlying behavioral health.  Seen by psychiatry with recommendations for inpatient psychiatry.  He is involuntarily committed.  Patient is medically stable for discharge whenever he can be placed by psychiatry.  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  He has been refusing his medications for weeks to months.  He is on several psychotropic medications such as Depakote, Haldol, Ativan.  He also takes Lamictal and Keppra for seizures.  His behavior changes appear to be chronic  progressive in nature and appear to be secondary to his underlying mental illness.  Other work-up including CT imaging of head, basic chemistry, ammonia, TSH, ABG were unrevealing.  Appreciate psychiatry input.  Recommendations are for inpatient psychiatry.  He is currently under involuntary commitment.---- - Inpatient psychiatric admission to a geropsychiatric facility continues to be recommended -Patient is medically stable for discharge to inpatient psychiatric/behavioral unit when bed becomes available  --- Poor compliance with medications and persistent psychotic type behavior disturbance persist -Continue to follow psychiatry service for medications adjustment and further recommendations -Continue treatment as per Psychiatry recommendations.  After further eval by psych provider-- changes to pt's regimen----  --Depakote was dced bcos it is difficult to monitor levels (pt is not always cooperative with lab draws)--- --Scheduled--c/n Amantadine, Namenda, Haldol, Prozac , Clonidine patch and Tegretol  Prn-- Im Haldol and im Ativan ordered  2)HTN--BP is not at goal partly due to poor compliance with medications - -will continue oral amlodipine and clonidine patch  3)Dementia Associated with Neurosyphilis---  Review of records indicate that he was treated for neurosyphilis in 2018.   -He is chronically on Namenda which he has been refusing intermittently.  4)Thrombocytopenia/pancytopenia--patient with some degree of mild pancytopenia which is stable--  Appears to be a chronic issue. -No signs of overt bleeding.  5)Seizure disorder---- Chronically on Keppra and Lamictal.   --He has been refusing these medications.  He has IM Ativan as needed ordered if he has any breakthrough seizures.  6)Noncompliance----  Patient continues refusing medications intermittently.  DVT prophylaxis: heparin injection 5,000 Units Start: 07/04/20 0600 SCDs Start: 07/04/20 0130  Code Status: full code Family  Communication: No family  present Disposition Plan: Status is: Inpatient  Remains inpatient appropriate because:Unsafe d/c plan   Dispo: The patient is from: SNF              Anticipated d/c is to: Inpatient psychiatry              Anticipated d/c date is: when ever there is a bed at psych facility.               Patient currently is medically stable to d/c. ---- Inpatient psychiatric admission to a geropsychiatric facility continues to be recommended -Patient is medically stable for discharge to inpatient psychiatric/behavioral unit when bed becomes available.  Patient continued to be first priority on waiting list at [facility].     Consultants:   Psychiatry  Procedures:   See below for x-ray reports.  Antimicrobials:   None currently (has completed antibiotics for UTI).  Subjective: No fever, no chest pain, no nausea, no vomiting.  Patient is afebrile.  Overnight demonstrating physical aggressive behavior towards staff, being difficult to redirect and and requiring soft wrist restraints.   Objective: Vitals:   08/16/20 1948 08/17/20 1427 08/18/20 0300 08/18/20 2020  BP: 120/65 120/64 (!) 153/90 134/82  Pulse: 86 (!) 103 (!) 104 87  Resp: 16 19 19 20   Temp: (!) 97.4 F (36.3 C) 98.3 F (36.8 C) 98.7 F (37.1 C)   TempSrc: Oral  Oral   SpO2: 99% 98% 99% 100%  Weight:      Height:        Intake/Output Summary (Last 24 hours) at 08/20/2020 0911 Last data filed at 08/19/2020 1400 Gross per 24 hour  Intake 240 ml  Output --  Net 240 ml   Filed Weights   07/03/20 1504 07/04/20 0200 08/11/20 2312  Weight: 92.5 kg 97.3 kg 99 kg    Examination: General exam: Alert, awake, oriented x 2, expressing to be tired of being in this place.  No chest pain, no nausea, no vomiting.  Patient is afebrile and hemodynamically stable.  Overnight he was coming out of the room several times, was physically aggressive with staff unable to be redirected.  He was medicated with significant  changes in his behavior.  Soft wrist restraints for his own safety and the safety of others were applied around 11:40 PM. Respiratory system: Clear to auscultation. Respiratory effort normal. Cardiovascular system:RRR. No murmurs, rubs, gallops. Gastrointestinal system: Abdomen is nondistended, soft and nontender. No organomegaly or masses felt. Normal bowel sounds heard. Central nervous system: Alert and oriented. No focal neurological deficits. Extremities: No C/C/E, +pedal pulses Skin: No rashes, lesions or ulcers Psychiatry: Judgement and insight continue to be poor; behavior continued to fluctuate intermittently.  Radiology Studies: No results found.  Scheduled Meds:  amantadine  100 mg Oral BID   amLODipine  5 mg Oral Daily   carbamazepine  100 mg Oral QPC breakfast   carbamazepine  200 mg Oral BID   cloNIDine  0.2 mg Transdermal Weekly   FLUoxetine  40 mg Oral Daily   folic acid  1 mg Oral Daily   haloperidol  2 mg Oral TID   heparin  5,000 Units Subcutaneous Q8H   levETIRAcetam  500 mg Oral BID   memantine  10 mg Oral QHS   nicotine  7 mg Transdermal Daily   [START ON 09/08/2020] paliperidone  234 mg Intramuscular Q28 days   vitamin B-12  500 mcg Oral Daily   Continuous Infusions:   LOS: 47 days    -  Patient is independent with ADLs, ambulatory  -Patient is medically stable for discharge to inpatient psychiatric/behavioral unit when bed becomes available  Barton Dubois, MD [facility]  If 7PM-7AM, please contact night-coverage [email or web address]  08/20/2020, 9:11 AM

## 2020-08-20 NOTE — BH Assessment (Signed)
Per [facility] staff, pt is sleeping. Not available for reassessment at this time.

## 2020-08-20 NOTE — Progress Notes (Signed)
Patient verbally aggressive, coming out in the hallway attempting to hit staff with wet towels. Grabbing patients, aggressive towards visitors in hallway. Patient will not listen to staff. Has came out into hallway several times today, unable to be redirected by staff, yelling at staff. Patient attempted to hit staff and grab staff and was assisted back to bed and bilateral soft wrist restraints applied. Sitter remains with patient, MD made aware of restraints applied. Will continue to monitor.

## 2020-08-21 NOTE — Progress Notes (Signed)
Restraints removed at 0400 d/t incontinent episode in bed. Pt. Showered and sat in chair remainder of shift, sandwich and drink was provided after shower. Compliant when redirected.

## 2020-08-21 NOTE — BH Assessment (Addendum)
TTS would like to complete patient's reassessment.   TTS has contacted nursing staff on the medical floor and requested TTS machine to be placed in patient's room.   TTS has requested that patient's nurse message and/or contact TTS via phone once the machine is set up patient's room.

## 2020-08-21 NOTE — BH Assessment (Signed)
Received a call back from patient's nurse. He is currently sleeping. Will call TTS once he awakens to complete his TTS assessment.

## 2020-08-21 NOTE — Progress Notes (Signed)
PROGRESS NOTE    Henry Smith  QAS:[id number] DOB: 09/07/1941 DOA: 07/03/2020 PCP: Caprice Renshaw, MD   Brief Narrative:   79 year old male with a history of neurosyphilis, dementia, schizoaffective disorder, COPD, thrombocytopenia, seizure disorder, who is a long-term resident of a nursing facility, was brought to the hospital with progressive changes in mental status.  Patient had been coming increasingly aggressive, was refusing any medications, becoming paranoid.  More recently, staff had noted that he was drinking his urine.  He was evaluated in the emergency room and felt that he may have a urinary tract infection was admitted to the hospital service.  Further review of urine studies indicate that his positive urine culture is likely a colonization and not a true infection.  He did not have any other signs of active infection.  Discussed with infectious disease who agreed with not treating with antibiotics.  It was felt that his progressive decline in mental status was related to his underlying behavioral health.  Seen by psychiatry with recommendations for inpatient psychiatry.  He is involuntarily committed.  Patient is medically stable for discharge whenever he can be placed by psychiatry.  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  He has been refusing his medications for weeks to months.  He is on several psychotropic medications such as Depakote, Haldol, Ativan.  He also takes Lamictal and Keppra for seizures.  His behavior changes appear to be chronic  progressive in nature and appear to be secondary to his underlying mental illness.  Other work-up including CT imaging of head, basic chemistry, ammonia, TSH, ABG were unrevealing.  Appreciate psychiatry input.  Recommendations are for inpatient psychiatry.  He is currently under involuntary commitment.---- -Inpatient psychiatric admission to a geropsychiatricfacility continues to be recommended -Patient is medically stable for discharge to inpatient psychiatric/behavioral unit when bed becomes available  --- Poor compliance with medications and persistent psychotic type behavior disturbance persist -Continue to follow psychiatry service for medications adjustment and further recommendations -Continue treatment as per Psychiatry recommendations.  After further eval by psych provider-- changes to pt's regimen----  --Depakote was dced bcos it is difficult to monitor levels (pt is not always cooperative with lab draws)--- --Scheduled--c/n Amantadine, Namenda, Haldol, Prozac , Clonidine patch and Tegretol  Prn-- Im Haldol and im Ativan ordered  2)HTN--BP is now at goal -will continue oral amlodipine and clonidine patch  3)Dementia Associated with Neurosyphilis---  Review of records indicate that he was treated for neurosyphilis in 2018.   -He is chronically on Namenda which he has been refusing intermittently.  4)Thrombocytopenia/pancytopenia--patient with some degree of mild pancytopenia which is stable--  Appears to be a chronic issue. -No signs of overt bleeding.  5)Seizure disorder---- Chronically on Keppra and Lamictal.   --He has been refusing these medications.  He has IM Ativan as needed ordered if he has any breakthrough seizures.  6)Noncompliance----  Patient continues refusing medications intermittently.  DVT prophylaxis: heparin injection 5,000 Units Start: 07/04/20 0600 SCDs Start: 07/04/20 0130  Code Status: full code Family Communication: No family  present Disposition Plan: Status is: Inpatient  Remains inpatient appropriate because:Unsafe d/c  plan   Dispo: The patient is from: SNF  Anticipated d/c is to: Inpatient psychiatry  Anticipated d/c date is: when ever there is a bed at psych facility.   Patient currently is medically stable to d/c. ----Inpatient psychiatric admission to a geropsychiatricfacility continues to be recommended -Patient is medically stable for discharge to inpatient psychiatric/behavioral unit when bed becomes available.  Patient continued to be first priority on waiting list at [facility].     Consultants:   Psychiatry  Procedures:   See below for x-ray reports.  Antimicrobials:   None currently (has completed antibiotics for UTI).  Subjective: Patient seen and evaluated today with no new acute complaints or concerns. No acute concerns or events noted overnight.  Objective: Vitals:   08/16/20 1948 08/17/20 1427 08/18/20 0300 08/18/20 2020  BP: 120/65 120/64 (!) 153/90 134/82  Pulse: 86 (!) 103 (!) 104 87  Resp: 16 19 19 20   Temp: (!) 97.4 F (36.3 C) 98.3 F (36.8 C) 98.7 F (37.1 C)   TempSrc: Oral  Oral   SpO2: 99% 98% 99% 100%  Weight:      Height:        Intake/Output Summary (Last 24 hours) at 08/21/2020 1357 Last data filed at 08/21/2020 0900 Gross per 24 hour  Intake 480 ml  Output --  Net 480 ml   Filed Weights   07/03/20 1504 07/04/20 0200 08/11/20 2312  Weight: 92.5 kg 97.3 kg 99 kg    Examination:  General exam: Appears calm and comfortable. Still remains in restraints. Respiratory system: Clear to auscultation. Respiratory effort normal. Cardiovascular system: S1 & S2 heard, RRR.  Gastrointestinal system: Abdomen is nondistended, soft and nontender.  Central nervous system: Alert and oriented. No focal neurological deficits. Extremities: Symmetric 5 x 5 power. Skin: No rashes, lesions or ulcers Psychiatry: Judgement and  insight appear normal. Mood & affect appropriate.     Data Reviewed: I have personally reviewed following labs and imaging studies  CBC: No results for input(s): WBC, NEUTROABS, HGB, HCT, MCV, PLT in the last 168 hours. Basic Metabolic Panel: No results for input(s): NA, K, CL, CO2, GLUCOSE, BUN, CREATININE, CALCIUM, MG, PHOS in the last 168 hours. GFR: Estimated Creatinine Clearance: 92.8 mL/min (by C-G formula based on SCr of 0.77 mg/dL). Liver Function Tests: No results for input(s): AST, ALT, ALKPHOS, BILITOT, PROT, ALBUMIN in the last 168 hours. No results for input(s): LIPASE, AMYLASE in the last 168 hours. No results for input(s): AMMONIA in the last 168 hours. Coagulation Profile: No results for input(s): INR, PROTIME in the last 168 hours. Cardiac Enzymes: No results for input(s): CKTOTAL, CKMB, CKMBINDEX, TROPONINI in the last 168 hours. BNP (last 3 results) No results for input(s): PROBNP in the last 8760 hours. HbA1C: No results for input(s): HGBA1C in the last 72 hours. CBG: No results for input(s): GLUCAP in the last 168 hours. Lipid Profile: No results for input(s): CHOL, HDL, LDLCALC, TRIG, CHOLHDL, LDLDIRECT in the last 72 hours. Thyroid Function Tests: No results for input(s): TSH, T4TOTAL, FREET4, T3FREE, THYROIDAB in the last 72 hours. Anemia Panel: No results for input(s): VITAMINB12, FOLATE, FERRITIN, TIBC, IRON, RETICCTPCT in the last 72 hours. Sepsis Labs: No results for input(s): PROCALCITON, LATICACIDVEN in the last 168 hours.  No results found for this or any previous visit (from the past 240 hour(s)).       Radiology Studies: No results found.      Scheduled Meds: . amantadine  100 mg Oral BID  .  amLODipine  5 mg Oral Daily  . carbamazepine  100 mg Oral QPC breakfast  . carbamazepine  200 mg Oral BID  . cloNIDine  0.2 mg Transdermal Weekly  . FLUoxetine  40 mg Oral Daily  . folic acid  1 mg Oral Daily  . haloperidol  2 mg Oral TID   . heparin  5,000 Units Subcutaneous Q8H  . levETIRAcetam  500 mg Oral BID  . memantine  10 mg Oral QHS  . nicotine  7 mg Transdermal Daily  . [START ON 09/08/2020] paliperidone  234 mg Intramuscular Q28 days  . vitamin B-12  500 mcg Oral Daily    LOS: 48 days    Time spent: 30 minutes    Ryiah Bellissimo Darleen Crocker, DO [facility]  If 7PM-7AM, please contact night-coverage [email or web address] 08/21/2020, 1:57 PM

## 2020-08-22 NOTE — B[facility] Counselor (Signed)
Reassessment:  Today pt was lying in bed and was having hard time sitting up. Pt needed the nurse assistance to sit up but was unable to fully sit up right due to pain in his back. While attempting to completed the assessment client appeared to fall asleep and needed the nurse assistance to relay what the counselor was saying to him due to client stating that counselor was speaking spanish. .  Pt denied si/hi/avh. Pt stated that he has not been sleeping good, his appetite was normal and he denied being feeling depressed.  Pt was dressed in a hospital gown, his mood was "ok", he was alert, non engaged and and his affect was flat. Pt eye contact was normal and his speech was normal in tone and impoverished.

## 2020-08-22 NOTE — Progress Notes (Signed)
CSW spoke with Peggye Fothergill, the chief nursing officer at [facility] [phone]). She reports that pt remains on their priority list and is unsure when a bed will be available. She stated that she will provide Dr. Lacinda Axon with Dr. Dwyane Dee, [facility] medical director's number.    Disposition will continue to follow.    Audree Camel, MSW, LCSW, [place] Clinical Social Worker II Disposition CSW [phone]

## 2020-08-22 NOTE — Progress Notes (Signed)
Patient very agitated, verbally abusive towards staff, security notified. Patient attempted to leave unit and went into empty patient room, patient was redirected by staff to return to his room, patient continues to be verbally abusive toward staff and physically aggressive toward staff. Security and staff at bedside.  Patient redirected again and patient returned to room.

## 2020-08-22 NOTE — Progress Notes (Signed)
PROGRESS NOTE    Henry Smith  GBT:[id number] DOB: [birth year] DOA: 07/03/2020 PCP: Caprice Renshaw, MD   Brief Narrative:  79 year old male with a history of neurosyphilis, dementia, schizoaffective disorder, COPD, thrombocytopenia, seizure disorder, who is a long-term resident of a nursing facility, was brought to the hospital with progressive changes in mental status. Patient had been coming increasingly aggressive, was refusing any medications, becoming paranoid. More recently, staff had noted that he was drinking his urine. He was evaluated in the emergency room and felt that he may have a urinary tract infection was admitted to the hospital service. Further review of urine studies indicate that his positive urine culture is likely a colonization and not a true infection. He did not have any other signs of active infection. Discussed with infectious disease who agreed with not treating with antibiotics. It was felt that his progressive decline in mental status was related to his underlying behavioral health. Seen by psychiatry with recommendations for inpatient psychiatry. He is involuntarily committed. Patient is medically stable for discharge whenever he can be placed by psychiatry.  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 He has been refusing his medications for weeks to months.He is on several psychotropic medications such as Depakote, Haldol, Ativan. He also takes Lamictal and Keppra for seizures. His behavior changes appear to be chronic  progressive in nature and appear to be secondary to his underlying mental illness. Other work-up including CT imaging of head, basic chemistry, ammonia, TSH, ABG were unrevealing. Appreciate psychiatry input. Recommendations are for inpatient psychiatry. He is currently under involuntary commitment.---- -Inpatient psychiatric admission to a geropsychiatricfacility continues to be recommended -Patient is medically stable for discharge to inpatient psychiatric/behavioral unit when bed becomes available  --- Poor compliance with medications and persistent psychotic type behavior disturbance persist -Continue to follow psychiatry service for medications adjustment and further recommendations -Continue treatment as per Psychiatry recommendations.  After further eval by psych provider-- changes to pt's regimen----  --Depakote was dced bcos it is difficult to monitor levels (pt is not always cooperative with lab draws)--- --Scheduled--c/n Amantadine, Namenda, Haldol, Prozac , Clonidine patch and Tegretol  Prn-- Im Haldol and im Ativan ordered  2)HTN--BP is now at goal -will continue oral amlodipine and clonidine patch  3)Dementia Associated with Neurosyphilis--- Review of records indicate that he was treated for neurosyphilis in 2018.  -He is chronically on Namenda which he has been refusing intermittently.  4)Thrombocytopenia/pancytopenia--patient with some degree of mild pancytopenia which is stable-- Appears to be a chronic issue. -No signs of overt bleeding.  5)Seizure disorder---- Chronically on Keppra and Lamictal.  --He has been refusing these medications. He has IM Ativan as needed ordered if he has any breakthrough seizures.  6)Noncompliance---- Patient continues refusing medications intermittently.  DVT prophylaxis:heparin injection 5,000 Units Start: 07/04/20 0600 SCDs Start: 07/04/20 0130  Code Status:full code Family Communication:No family  present Disposition Plan:Status is: Inpatient  Remains inpatient appropriate because:Unsafe d/c plan   Dispo: The patient is from: SNF Anticipated d/c is to: Inpatient psychiatry Anticipated d/c date is: when ever there is a bed at psych facility.  Patient currently is medically stable to d/c. ----Inpatient psychiatric admission to a geropsychiatricfacility continues to be recommended -Patient is medically stable for discharge to inpatient psychiatric/behavioral unit when bed becomes available. Patient continued to be first priority on waiting list at [facility].    Consultants:  Psychiatry  Procedures:  See below for x-ray reports.  Antimicrobials:   None currently (has completed antibiotics for UTI).  Subjective: Patient seen and evaluated today with no new acute complaints or concerns. No acute concerns or events noted overnight. Currently sleeping.  Objective: Vitals:   08/17/20 1427 08/18/20 0300 08/18/20 2020 08/21/20 2100  BP: 120/64 (!) 153/90 134/82 (!) 152/65  Pulse: (!) 103 (!) 104 87 89  Resp: 19 19 20 16   Temp: 98.3 F (36.8 C) 98.7 F (37.1 C)  97.8 F (36.6 C)  TempSrc:  Oral  Oral  SpO2: 98% 99% 100% 94%  Weight:      Height:        Intake/Output Summary (Last 24 hours) at 08/22/2020 1356 Last data filed at 08/21/2020 1700 Gross per 24 hour  Intake --  Output 1600 ml  Net -1600 ml   Filed Weights   07/03/20 1504 07/04/20 0200 08/11/20 2312  Weight: 92.5 kg 97.3 kg 99 kg    Examination:  General exam: Appears calm and comfortable, sleeping Respiratory system: Clear to auscultation. Respiratory effort normal. Cardiovascular system: S1 & S2 heard, RRR.  Gastrointestinal system: Abdomen is nondistended, soft and nontender.  Central nervous system: Sleeping Extremities: No edema Skin: No rashes, lesions or ulcers Psychiatry: Cannot be assessed.    Data Reviewed: I have personally reviewed  following labs and imaging studies  CBC: No results for input(s): WBC, NEUTROABS, HGB, HCT, MCV, PLT in the last 168 hours. Basic Metabolic Panel: No results for input(s): NA, K, CL, CO2, GLUCOSE, BUN, CREATININE, CALCIUM, MG, PHOS in the last 168 hours. GFR: Estimated Creatinine Clearance: 92.8 mL/min (by C-G formula based on SCr of 0.77 mg/dL). Liver Function Tests: No results for input(s): AST, ALT, ALKPHOS, BILITOT, PROT, ALBUMIN in the last 168 hours. No results for input(s): LIPASE, AMYLASE in the last 168 hours. No results for input(s): AMMONIA in the last 168 hours. Coagulation Profile: No results for input(s): INR, PROTIME in the last 168 hours. Cardiac Enzymes: No results for input(s): CKTOTAL, CKMB, CKMBINDEX, TROPONINI in the last 168 hours. BNP (last 3 results) No results for input(s): PROBNP in the last 8760 hours. HbA1C: No results for input(s): HGBA1C in the last 72 hours. CBG: No results for input(s): GLUCAP in the last 168 hours. Lipid Profile: No results for input(s): CHOL, HDL, LDLCALC, TRIG, CHOLHDL, LDLDIRECT in the last 72 hours. Thyroid Function Tests: No results for input(s): TSH, T4TOTAL, FREET4, T3FREE, THYROIDAB in the last 72 hours. Anemia Panel: No results for input(s): VITAMINB12, FOLATE, FERRITIN, TIBC, IRON, RETICCTPCT in the last 72 hours. Sepsis Labs: No results for input(s): PROCALCITON, LATICACIDVEN in the last 168 hours.  No results found for this or any previous visit (from the past 240 hour(s)).       Radiology Studies: No results found.      Scheduled Meds: . amantadine  100 mg Oral BID  . amLODipine  5 mg Oral Daily  . carbamazepine  100 mg Oral QPC breakfast  . carbamazepine  200 mg Oral BID  . cloNIDine  0.2 mg Transdermal Weekly  . FLUoxetine  40 mg Oral Daily  . folic acid  1 mg Oral Daily  . haloperidol  2 mg Oral TID  . heparin  5,000 Units Subcutaneous Q8H  . levETIRAcetam  500 mg Oral BID  . memantine  10 mg Oral  QHS  . nicotine  7 mg Transdermal Daily  . [START ON 09/08/2020] paliperidone  234 mg Intramuscular Q28 days  . vitamin B-12  500 mcg Oral Daily    LOS: 49 days    Time spent: 30 minutes    Yazhini Mcaulay Darleen Crocker, DO [facility]  If 7PM-7AM, please contact night-coverage [email or web address] 08/22/2020, 1:56 PM

## 2020-08-23 NOTE — Progress Notes (Signed)
Patient talking to nurse, verbally stating that he was having a good day. [facility] counselor came on screen and patient motioned toward his neck that he could not speak, however there was no medical reasoning for him not being able to verbally communicate with the counselors. Patient shook his head in suggestion that he would feel more comfortable talking with a male counselor. Patient continued to verbal speak after [facility] camera was turned off.

## 2020-08-23 NOTE — BH Assessment (Addendum)
Contacted [facility] and confirmed with Jermain @ 1329  that patient remains on the [facility] priority wait list. Per Jermain, no movement.   Also, contacted [place] and left a message with Biomedical engineer.. Clinician was told that someone would return my call.

## 2020-08-23 NOTE — Progress Notes (Signed)
PROGRESS NOTE    Henry Smith  UVO:[id number] DOB: 1941-02-02 DOA: 07/03/2020 PCP: Caprice Renshaw, MD   Brief Narrative:  79 year old male with a history of neurosyphilis, dementia, schizoaffective disorder, COPD, thrombocytopenia, seizure disorder, who is a long-term resident of a nursing facility, was brought to the hospital with progressive changes in mental status. Patient had been coming increasingly aggressive, was refusing any medications, becoming paranoid. More recently, staff had noted that he was drinking his urine. He was evaluated in the emergency room and felt that he may have a urinary tract infection was admitted to the hospital service. Further review of urine studies indicate that his positive urine culture is likely a colonization and not a true infection. He did not have any other signs of active infection. Discussed with infectious disease who agreed with not treating with antibiotics. It was felt that his progressive decline in mental status was related to his underlying behavioral health. Seen by psychiatry with recommendations for inpatient psychiatry. He is involuntarily committed. Patient is medically stable for discharge whenever he can be placed by psychiatry.  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 He has been refusing his medications for weeks to months.He is on several psychotropic medications such as Depakote, Haldol, Ativan. He also takes Lamictal and Keppra for seizures. His behavior changes appear to be chronic  progressive in nature and appear to be secondary to his underlying mental illness. Other work-up including CT imaging of head, basic chemistry, ammonia, TSH, ABG were unrevealing. Appreciate psychiatry input. Recommendations are for inpatient psychiatry. He is currently under involuntary commitment.---- -Inpatient psychiatric admission to a geropsychiatricfacility continues to be recommended -Patient is medically stable for discharge to inpatient psychiatric/behavioral unit when bed becomes available  --- Poor compliance with medications and persistent psychotic type behavior disturbance persist -Continue to follow psychiatry service for medications adjustment and further recommendations -Continue treatment as per Psychiatry recommendations.  After further eval by psych provider-- changes to pt's regimen----  --Depakote was dced bcos it is difficult to monitor levels (pt is not always cooperative with lab draws)--- --Scheduled--c/n Amantadine, Namenda, Haldol, Prozac , Clonidine patch and Tegretol  Prn-- Im Haldol and im Ativan ordered  2)HTN--BP isnow at goal -will continue oral amlodipine and clonidine patch  3)Dementia Associated with Neurosyphilis--- Review of records indicate that he was treated for neurosyphilis in 2018.  -He is chronically on Namenda which he has been refusing intermittently.  4)Thrombocytopenia/pancytopenia--patient with some degree of mild pancytopenia which is stable-- Appears to be a chronic issue. -No signs of overt bleeding.  5)Seizure disorder---- Chronically on Keppra and Lamictal.  --He has been refusing these medications. He has IM Ativan as needed ordered if he has any breakthrough seizures.  6)Noncompliance---- Patient continues refusing medications intermittently.  DVT prophylaxis:heparin injection 5,000 Units Start: 07/04/20 0600 SCDs Start: 07/04/20 0130  Code Status:full code Family Communication:No family  present Disposition Plan:Status is: Inpatient  Remains inpatient appropriate because:Unsafe d/c plan   Dispo: The patient is from: SNF Anticipated d/c is to: Inpatient psychiatry Anticipated d/c date is: when ever there is a bed at psych facility.  Patient currently is  medically stable to d/c. ----Inpatient psychiatric admission to a geropsychiatricfacility continues to be recommended -Patient is medically stable for discharge to inpatient psychiatric/behavioral unit when bed becomes available. Patient continued to be first priority on waiting list at [facility].    Consultants:  Psychiatry  Procedures:  See below for x-ray reports.  Antimicrobials:   None currently (has completed antibiotics for UTI).   Subjective: Patient seen and evaluated today and is currently quite somnolent.  He has made some inappropriate comments to nursing staff and has refused to talk to [place] counselor today.  Objective: Vitals:   08/21/20 2100 08/22/20 1551 08/23/20 0114 08/23/20 0610  BP: (!) 152/65  (!) 147/77 127/70  Pulse: 89 68 83 64  Resp: 16  16 17   Temp: 97.8 F (36.6 C) 97.6 F (36.4 C) 97.8 F (36.6 C) 97.9 F (36.6 C)  TempSrc: Oral Oral Oral Oral  SpO2: 94%  99% 94%  Weight:      Height:       No intake or output data in the 24 hours ending 08/23/20 1115 Filed Weights   07/03/20 1504 07/04/20 0200 08/11/20 2312  Weight: 92.5 kg 97.3 kg 99 kg    Examination:  General exam: Appears calm and comfortable  Respiratory system: Clear to auscultation. Respiratory effort normal. Cardiovascular system: S1 & S2 heard, RRR. Gastrointestinal system: Abdomen is nondistended, soft and nontender.  Central nervous system: Alert and oriented. No focal neurological deficits. Extremities: No edema Skin: No rashes, lesions or ulcers Psychiatry: Difficult to assess.    Data Reviewed: I have personally reviewed following labs and imaging  studies  CBC: No results for input(s): WBC, NEUTROABS, HGB, HCT, MCV, PLT in the last 168 hours. Basic Metabolic Panel: No results for input(s): NA, K, CL, CO2, GLUCOSE, BUN, CREATININE, CALCIUM, MG, PHOS in the last 168 hours. GFR: Estimated Creatinine Clearance: 92.8 mL/min (by C-G formula based on SCr of 0.77 mg/dL). Liver Function Tests: No results for input(s): AST, ALT, ALKPHOS, BILITOT, PROT, ALBUMIN in the last 168 hours. No results for input(s): LIPASE, AMYLASE in the last 168 hours. No results for input(s): AMMONIA in the last 168 hours. Coagulation Profile: No results for input(s): INR, PROTIME in the last 168 hours. Cardiac Enzymes: No results for input(s): CKTOTAL, CKMB, CKMBINDEX, TROPONINI in the last 168 hours. BNP (last 3 results) No results for input(s): PROBNP in the last 8760 hours. HbA1C: No results for input(s): HGBA1C in the last 72 hours. CBG: No results for input(s): GLUCAP in the last 168 hours. Lipid Profile: No results for input(s): CHOL, HDL, LDLCALC, TRIG, CHOLHDL, LDLDIRECT in the last 72 hours. Thyroid Function Tests: No results for input(s): TSH, T4TOTAL, FREET4, T3FREE, THYROIDAB in the last 72 hours. Anemia Panel: No results for input(s): VITAMINB12, FOLATE, FERRITIN, TIBC, IRON, RETICCTPCT in the last 72 hours. Sepsis Labs: No results for input(s): PROCALCITON, LATICACIDVEN in the last 168 hours.  No results found for this or any previous visit (from the past 240 hour(s)).       Radiology Studies: No results found.      Scheduled Meds: . amantadine  100 mg Oral BID  . amLODipine  5 mg Oral Daily  . carbamazepine  100 mg Oral QPC breakfast  . carbamazepine  200 mg Oral BID  . cloNIDine  0.2 mg Transdermal Weekly  . FLUoxetine  40 mg Oral Daily  . folic acid  1 mg Oral Daily  . haloperidol  2 mg Oral TID  . heparin  5,000 Units Subcutaneous Q8H  . levETIRAcetam  500 mg Oral BID  . memantine  10 mg Oral QHS  . nicotine  7 mg  Transdermal Daily  . [START ON 09/08/2020] paliperidone  234 mg Intramuscular Q28 days  . vitamin B-12  500 mcg Oral Daily    LOS: 50 days    Time spent: 30 minutes    Henry Smith Darleen Crocker, DO [facility]  If 7PM-7AM, please contact night-coverage [email or web address] 08/23/2020, 11:15 AM

## 2020-08-23 NOTE — BH Assessment (Addendum)
Received a call back from [place] and spoke to "Selby". Clinician inquired about patient's status on the wait list.   States that patient is not on her wait list for admission. Upon chart review, patient was accepted to the wait list on 08/17/2020 @ 0957 by "Wendelyn Breslow". Also, provided her details of LCSW staff calling to confirm his status on the wait list. She is willing to place him back on the wait list. States that she doesn't have his referral packet and asked for it to be re-faxed  Clinician re-faxed information for patient to be re-accepted to the wait list. .

## 2020-08-23 NOTE — BH Assessment (Addendum)
TTS attempted to complete reassessment with pt but was unsuccessful due to pt choosing to be mute. Pt only responded to questions using unclear hand and head gestures. Pt current RN asked if he preferred to speak with a male counselor and pt nodded his head yes.   TTS communicated pt's preference to TTS team.

## 2020-08-23 NOTE — BH Assessment (Addendum)
Received a request from LCSW at [facility] to fax a report to [facility]. Clinician faxed a report that was prepared on 08/23/2020 by Irish Elders, Chief Nursing Officer, [facility]. Confirmed receipt of this faxed document.

## 2020-08-23 NOTE — Progress Notes (Signed)
Patient rubbed nurse's arm while she was giving the patient his medications.  He also said, "I can't wait until the honeymoon starts."  Patient was asked not to touch the nurse and to not make inappropriate comments.

## 2020-08-24 MED ORDER — HALOPERIDOL 2 MG PO TABS
2.0000 mg | ORAL_TABLET | Freq: Once | ORAL | Status: AC
  Administered 2020-08-24: 2 mg via ORAL
  Filled 2020-08-24: qty 1

## 2020-08-24 NOTE — Progress Notes (Signed)
PROGRESS NOTE    Henry Smith  GGY:[id number] DOB: Mar 04, 1941 DOA: 07/03/2020 PCP: Caprice Renshaw, MD   Brief Narrative:  79 year old male with a history of neurosyphilis, dementia, schizoaffective disorder, COPD, thrombocytopenia, seizure disorder, who is a long-term resident of a nursing facility, was brought to the hospital with progressive changes in mental status. Patient had been coming increasingly aggressive, was refusing any medications, becoming paranoid. More recently, staff had noted that he was drinking his urine. He was evaluated in the emergency room and felt that he may have a urinary tract infection was admitted to the hospital service. Further review of urine studies indicate that his positive urine culture is likely a colonization and not a true infection. He did not have any other signs of active infection. Discussed with infectious disease who agreed with not treating with antibiotics. It was felt that his progressive decline in mental status was related to his underlying behavioral health. Seen by psychiatry with recommendations for inpatient psychiatry. He is involuntarily committed. Patient is medically stable for discharge whenever he can be placed by psychiatry.  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 He has been refusing his medications for weeks to months.He is on several psychotropic medications such as Depakote, Haldol, Ativan. He also takes Lamictal and Keppra for seizures. His behavior changes appear to be chronic  progressive in nature and appear to be secondary to his underlying mental illness. Other work-up including CT imaging of head, basic chemistry, ammonia, TSH, ABG were unrevealing. Appreciate psychiatry input. Recommendations are for inpatient psychiatry. He is currently under involuntary commitment.---- -Inpatient psychiatric admission to a geropsychiatricfacility continues to be recommended -Patient is medically stable for discharge to inpatient psychiatric/behavioral unit when bed becomes available  --- Poor compliance with medications and persistent psychotic type behavior disturbance persist -Continue to follow psychiatry service for medications adjustment and further recommendations -Continue treatment as per Psychiatry recommendations.  After further eval by psych provider-- changes to pt's regimen----  --Depakote was dced bcos it is difficult to monitor levels (pt is not always cooperative with lab draws)--- --Scheduled--c/n Amantadine, Namenda, Haldol, Prozac , Clonidine patch and Tegretol  Prn-- Im Haldol and im Ativan ordered  2)HTN--BP isnow at goal -will continue oral amlodipine and clonidine patch  3)Dementia Associated with Neurosyphilis--- Review of records indicate that he was treated for neurosyphilis in 2018.  -He is chronically on Namenda which he has been refusing intermittently.  4)Thrombocytopenia/pancytopenia--patient with some degree of mild pancytopenia which is stable-- Appears to be a chronic issue. -No signs of overt bleeding.  5)Seizure disorder---- Chronically on Keppra and Lamictal.  --He has been refusing these medications. He has IM Ativan as needed ordered if he has any breakthrough seizures.  6)Noncompliance---- Patient continues refusing medications intermittently.  DVT prophylaxis:heparin injection 5,000 Units Start: 07/04/20 0600 SCDs Start: 07/04/20 0130  Code Status:full code Family Communication:No family  present Disposition Plan:Status is: Inpatient  Remains inpatient appropriate because:Unsafe d/c plan   Dispo: The patient is from: SNF Anticipated d/c is to: Inpatient psychiatry Anticipated d/c date is: when ever there is a bed at psych facility.  Patient  currently is medically stable to d/c. ----Inpatient psychiatric admission to a geropsychiatricfacility continues to be recommended -Patient is medically stable for discharge to inpatient psychiatric/behavioral unit when bed becomes available. Patient continued to be first priority on waiting list at [facility].    Consultants:  Psychiatry  Procedures:  See below for x-ray reports.  Antimicrobials:   None currently (has completed antibiotics for UTI).   Subjective: Patient seen and evaluated today with no new acute complaints or concerns. No acute concerns or events noted overnight.  Objective: Vitals:   08/22/20 1551 08/23/20 0114 08/23/20 0610 08/23/20 1600  BP:  (!) 147/77 127/70 134/76  Pulse: 68 83 64 71  Resp:  16 17 18   Temp: 97.6 F (36.4 C) 97.8 F (36.6 C) 97.9 F (36.6 C) 98.4 F (36.9 C)  TempSrc: Oral Oral Oral Oral  SpO2:  99% 94% 97%  Weight:      Height:        Intake/Output Summary (Last 24 hours) at 08/24/2020 1413 Last data filed at 08/24/2020 1211 Gross per 24 hour  Intake 2040 ml  Output --  Net 2040 ml   Filed Weights   07/03/20 1504 07/04/20 0200 08/11/20 2312  Weight: 92.5 kg 97.3 kg 99 kg    Examination:  General exam: Appears calm and comfortable  Respiratory system: Clear to auscultation. Respiratory effort normal. Cardiovascular system: S1 & S2 heard, RRR.  Gastrointestinal system: Abdomen is nondistended, soft and nontender.  Central nervous system: Alert and oriented. No focal neurological deficits. Extremities: Symmetric 5 x 5 power. Skin: No rashes, lesions or ulcers Psychiatry: Flat affect    Data Reviewed: I have  personally reviewed following labs and imaging studies  CBC: No results for input(s): WBC, NEUTROABS, HGB, HCT, MCV, PLT in the last 168 hours. Basic Metabolic Panel: No results for input(s): NA, K, CL, CO2, GLUCOSE, BUN, CREATININE, CALCIUM, MG, PHOS in the last 168 hours. GFR: Estimated Creatinine Clearance: 92.8 mL/min (by C-G formula based on SCr of 0.77 mg/dL). Liver Function Tests: No results for input(s): AST, ALT, ALKPHOS, BILITOT, PROT, ALBUMIN in the last 168 hours. No results for input(s): LIPASE, AMYLASE in the last 168 hours. No results for input(s): AMMONIA in the last 168 hours. Coagulation Profile: No results for input(s): INR, PROTIME in the last 168 hours. Cardiac Enzymes: No results for input(s): CKTOTAL, CKMB, CKMBINDEX, TROPONINI in the last 168 hours. BNP (last 3 results) No results for input(s): PROBNP in the last 8760 hours. HbA1C: No results for input(s): HGBA1C in the last 72 hours. CBG: No results for input(s): GLUCAP in the last 168 hours. Lipid Profile: No results for input(s): CHOL, HDL, LDLCALC, TRIG, CHOLHDL, LDLDIRECT in the last 72 hours. Thyroid Function Tests: No results for input(s): TSH, T4TOTAL, FREET4, T3FREE, THYROIDAB in the last 72 hours. Anemia Panel: No results for input(s): VITAMINB12, FOLATE, FERRITIN, TIBC, IRON, RETICCTPCT in the last 72 hours. Sepsis Labs: No results for input(s): PROCALCITON, LATICACIDVEN in the last 168 hours.  No results found for this or any previous visit (from the past 240 hour(s)).       Radiology Studies: No results found.      Scheduled Meds: . amantadine  100 mg Oral BID  . amLODipine  5 mg Oral Daily  . carbamazepine  100 mg Oral QPC breakfast  . carbamazepine  200 mg Oral BID  . cloNIDine  0.2 mg Transdermal Weekly  . FLUoxetine  40 mg Oral Daily  . folic acid  1 mg  Oral Daily  . haloperidol  2 mg Oral TID  . heparin  5,000 Units Subcutaneous Q8H  . levETIRAcetam  500 mg Oral BID  .  memantine  10 mg Oral QHS  . nicotine  7 mg Transdermal Daily  . [START ON 09/08/2020] paliperidone  234 mg Intramuscular Q28 days  . vitamin B-12  500 mcg Oral Daily    LOS: 51 days    Time spent: 30 minutes    Esthela Brandner Darleen Crocker, DO [facility]  If 7PM-7AM, please contact night-coverage [email or web address] 08/24/2020, 2:13 PM

## 2020-08-24 NOTE — BH Assessment (Addendum)
Contacted [facility] and confirmed with Robintette @ 1311 that patient remains on the [facility] priority wait list. Per Robinette, no movement. This Probation officer again confirmed that the prepared report on 08/23/2020 by Irish Elders, Chief Nursing Officer, [facility] was received.  Also, contacted [facility] regarding patient's status on the wait list. Upon chart view, patient was accepted to their wait list on 9/4 by "Kat". Spoke to [employer]" on 9/10 whom stated that patient was not on the wait list. Informed her that another referral packet was sent 08/23/2020. States that she does not have it and asked for it to be re-faxed again. Clinician re-faxed referral packet.

## 2020-08-24 NOTE — BH Assessment (Signed)
Reassessment Note:   Per internal medicine note on 07/04/20: "Review of records indicate that patient has been refusing medications, been aggressive for several weeks now. He does not have any fever or leukocytosis or dysuria. I feel it is unlikely that his current presentation is solely related to a urinary tract infection. In fact, based on prior culture results, he may have urinary tract colonization. The patient has several underlying cognitive issues that could be contributing to his current state. He has known dementia, history of neurosyphilis, schizoaffective disorder. I suspect this may be worsening of his schizoaffective disorder. Patient required several doses of Geodon and Ativan in the emergency room. Since the patient has been repeatedly drinking his own urine, it was felt that he was at risk to harm himself andinvoluntary commitment orders were placed in the emergency room. "  Upon assessment today, patientis oriented to person and date, however not to place or situation.  He was calm, cooperative and behaviorally appropriate.  When assessed by this LPC previously, patient would walk out of the room and during one encounter he went into the bathroom and proceeded to drink his urine.  Today, he engages well, however continues to present with confusion and disorganized thought process.  He cannot recall why he came into the hospital initially.  Upon discussion of concerns at his facility, he states he has never been threatening or aggressive.  He denies current SI, HI and AVH.  He then begins to discuss next steps as being, "to first go two blocks from here for my honeymoon.  Then we will take it one day at a time."  He is unable to identify a person he will be on this honeymoon with and states he hasn't decided yet.  He also does not recall where he was living and seems unaware that he was living in a SNF.  Per chart review, patient continues to try to wander around and leave the unit.   He remained calm much of the day yesterday, however the day before patient was verbally abusive and aggressive towards staff.  He required security support and was eventually able to be redirected.  He continues to refuse medications at times.  Today, patient agrees to take recommended medications.   Mood instability, periods of agitation and aggressive behavior continue to be a concerns.    Disposition: Patient continues to meet criteria for inpatient geri-psych treatment. SW will continue to pursue appropriate inpatient programs. He continues to be priority status for [facility] wait list.  Another referral was submitted for patient to be place on the [facility] wait list.

## 2020-08-24 NOTE — BH Specialist Note (Signed)
Patient continues to meet inpatient criteria and remains priority status for [facility] wait list. Confirmed with "Wendelyn Breslow" at [facility] that patient is back on their wait list.

## 2020-08-24 NOTE — Progress Notes (Signed)
0935: Pt will occasionally use sexual remarks to staff members and will put his hands on the staff members attempting to grab their badges. Told pt that we must respect people's boundaries and that the sexual remarks are not appropriate here at the hospital. Pt voiced understanding.   1015: Pt attempted to leave his room. Security called and RN Izora Gala and this Probation officer was able to walk pt back into his room. Pt now sitting on his recliner.   1200: Pt has been sleeping on and off. Will wake up and use the restroom, but will have small incontinence episodes.   1815: Pt has been taking naps and will regularly walk to his bathroom whenever he has to urinate. This writer attempted to check on patient but patient yelled "stay away you have a demon on your hair". Asked the patient why does he think there is a demon on my hair. Pt stated "Come here in front of the mirror and I will show you". Told patient that I do not have a demon on my head. Pt then calmed down and is now back on his bed.

## 2020-08-25 NOTE — Progress Notes (Signed)
PROGRESS NOTE    Henry Smith  ZTI:[id number] DOB: 16-May-1941 DOA: 07/03/2020 PCP: Caprice Renshaw, MD   Brief Narrative:  79 year old male with a history of neurosyphilis, dementia, schizoaffective disorder, COPD, thrombocytopenia, seizure disorder, who is a long-term resident of a nursing facility, was brought to the hospital with progressive changes in mental status. Patient had been coming increasingly aggressive, was refusing any medications, becoming paranoid. More recently, staff had noted that he was drinking his urine. He was evaluated in the emergency room and felt that he may have a urinary tract infection was admitted to the hospital service. Further review of urine studies indicate that his positive urine culture is likely a colonization and not a true infection. He did not have any other signs of active infection. Discussed with infectious disease who agreed with not treating with antibiotics. It was felt that his progressive decline in mental status was related to his underlying behavioral health. Seen by psychiatry with recommendations for inpatient psychiatry. He is involuntarily committed. Patient is medically stable for discharge whenever he can be placed by psychiatry.  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 He has been refusing his medications for weeks to months.He is on several psychotropic medications such as Depakote, Haldol, Ativan. He also takes Lamictal and Keppra for seizures. His behavior changes appear to be chronic  progressive in nature and appear to be secondary to his underlying mental illness. Other work-up including CT imaging of head, basic chemistry, ammonia, TSH, ABG were unrevealing. Appreciate psychiatry input. Recommendations are for inpatient psychiatry. He is currently under involuntary commitment.---- -Inpatient psychiatric admission to a geropsychiatricfacility continues to be recommended -Patient is medically stable for discharge to inpatient psychiatric/behavioral unit when bed becomes available  --- Poor compliance with medications and persistent psychotic type behavior disturbance persist -Continue to follow psychiatry service for medications adjustment and further recommendations -Continue treatment as per Psychiatry recommendations.  After further eval by psych provider-- changes to pt's regimen----  --Depakote was dced bcos it is difficult to monitor levels (pt is not always cooperative with lab draws)--- --Scheduled--c/n Amantadine, Namenda, Haldol, Prozac , Clonidine patch and Tegretol  Prn-- Im Haldol and im Ativan ordered  2)HTN--BP isnow at goal -will continue oral amlodipine and clonidine patch  3)Dementia Associated with Neurosyphilis--- Review of records indicate that he was treated for neurosyphilis in 2018.  -He is chronically on Namenda which he has been refusing intermittently.  4)Thrombocytopenia/pancytopenia--patient with some degree of mild pancytopenia which is stable-- Appears to be a chronic issue. -No signs of overt bleeding.  5)Seizure disorder---- Chronically on Keppra and Lamictal.  --He has been refusing these medications. He has IM Ativan as needed ordered if he has any breakthrough seizures.  6)Noncompliance---- Patient continues refusing medications intermittently.  DVT prophylaxis:heparin injection 5,000 Units Start: 07/04/20 0600 SCDs Start: 07/04/20 0130  Code Status:full code Family Communication:No family  present Disposition Plan:Status is: Inpatient  Remains inpatient appropriate because:Unsafe d/c plan   Dispo: The patient is from: SNF Anticipated d/c is to: Inpatient psychiatry Anticipated d/c date is: when ever there is a bed at psych facility.  Patient  currently is medically stable to d/c. ----Inpatient psychiatric admission to a geropsychiatricfacility continues to be recommended -Patient is medically stable for discharge to inpatient psychiatric/behavioral unit when bed becomes available. Patient continued to be first priority on waiting list at [facility].    Consultants:  Psychiatry  Procedures:  See below for x-ray reports.  Antimicrobials:   None currently (has completed antibiotics for UTI).  Subjective: Patient seen and evaluated today with no new acute complaints or concerns. He continues to make inappropriate comments to staff.  Objective: Vitals:   08/23/20 0114 08/23/20 0610 08/23/20 1600 08/25/20 0644  BP: (!) 147/77 127/70 134/76 (!) 157/86  Pulse: 83 64 71 71  Resp: 16 17 18 20   Temp: 97.8 F (36.6 C) 97.9 F (36.6 C) 98.4 F (36.9 C) 98.6 F (37 C)  TempSrc: Oral Oral Oral Oral  SpO2: 99% 94% 97% 97%  Weight:      Height:        Intake/Output Summary (Last 24 hours) at 08/25/2020 1442 Last data filed at 08/25/2020 1214 Gross per 24 hour  Intake 600 ml  Output 300 ml  Net 300 ml   Filed Weights   07/03/20 1504 07/04/20 0200 08/11/20 2312  Weight: 92.5 kg 97.3 kg 99 kg    Examination:  General exam: Appears calm and comfortable  Respiratory system: Clear to auscultation. Respiratory effort normal. Cardiovascular system: S1 & S2 heard, RRR.  Gastrointestinal system: Abdomen is nondistended, soft and nontender.  Central nervous system: Alert and awake Extremities: Symmetric 5 x 5 power. No edema Skin: No rashes, lesions or ulcers Psychiatry: Difficult to assess.    Data Reviewed: I  have personally reviewed following labs and imaging studies  CBC: No results for input(s): WBC, NEUTROABS, HGB, HCT, MCV, PLT in the last 168 hours. Basic Metabolic Panel: No results for input(s): NA, K, CL, CO2, GLUCOSE, BUN, CREATININE, CALCIUM, MG, PHOS in the last 168 hours. GFR: Estimated Creatinine Clearance: 92.8 mL/min (by C-G formula based on SCr of 0.77 mg/dL). Liver Function Tests: No results for input(s): AST, ALT, ALKPHOS, BILITOT, PROT, ALBUMIN in the last 168 hours. No results for input(s): LIPASE, AMYLASE in the last 168 hours. No results for input(s): AMMONIA in the last 168 hours. Coagulation Profile: No results for input(s): INR, PROTIME in the last 168 hours. Cardiac Enzymes: No results for input(s): CKTOTAL, CKMB, CKMBINDEX, TROPONINI in the last 168 hours. BNP (last 3 results) No results for input(s): PROBNP in the last 8760 hours. HbA1C: No results for input(s): HGBA1C in the last 72 hours. CBG: No results for input(s): GLUCAP in the last 168 hours. Lipid Profile: No results for input(s): CHOL, HDL, LDLCALC, TRIG, CHOLHDL, LDLDIRECT in the last 72 hours. Thyroid Function Tests: No results for input(s): TSH, T4TOTAL, FREET4, T3FREE, THYROIDAB in the last 72 hours. Anemia Panel: No results for input(s): VITAMINB12, FOLATE, FERRITIN, TIBC, IRON, RETICCTPCT in the last 72 hours. Sepsis Labs: No results for input(s): PROCALCITON, LATICACIDVEN in the last 168 hours.  No results found for this or any previous visit (from the past 240 hour(s)).       Radiology Studies: No results found.      Scheduled Meds:  amantadine  100 mg Oral BID   amLODipine  5 mg Oral Daily   carbamazepine  100 mg Oral QPC breakfast   carbamazepine  200 mg Oral BID   cloNIDine  0.2 mg Transdermal Weekly   FLUoxetine  40 mg Oral Daily   folic acid  1  mg Oral Daily   haloperidol  2 mg Oral TID   heparin  5,000 Units Subcutaneous Q8H   levETIRAcetam  500 mg Oral BID    memantine  10 mg Oral QHS   nicotine  7 mg Transdermal Daily   [START ON 09/08/2020] paliperidone  234 mg Intramuscular Q28 days   vitamin B-12  500 mcg Oral Daily    LOS: 52 days    Time spent: 30 minutes    Henry Smith Darleen Crocker, DO [facility]  If 7PM-7AM, please contact night-coverage [email or web address] 08/25/2020, 2:42 PM

## 2020-08-25 NOTE — BH Assessment (Addendum)
Behavioral Health Reassessment Note:  Patient was seen for re-assessment.  His affect was bright today compared to previous days when this writer has assessed him.  Patient answered all questions non-verbally by shaking his head.  He was not willing to speak.  Patient indicated that he is eating and sleeping well. Patient continues to remain on the [facility] wait list for placement.

## 2020-08-25 NOTE — Progress Notes (Signed)
2150 patient left his room and head to stairway at which time he pulled the fire alarm.  Security responded and escorted patient back to his room.  Patient was instructed to stay in his room.    2205 patient again came out his room and started down the hallway, This RN cut him off and redirected him back to his room.  Security also responded and instructed patient if he continues to try to leave the building he will be placed back in restraints.

## 2020-08-25 NOTE — Progress Notes (Signed)
Patient had one episode of refusing to listen to staff and get in his bed.  Patient acted like he could not put his pants on, then he got on his knees beside the bed and refused to get up.  Security was called to help get him back in the bed. Patient refused his heparin injection both times. Patient urinated in the floor a couple times, patient constantly tried to get the tech to come put his pants on him after he had already change his pants 3 times.  Patient made a couple rude comments, " come her baby" and " when is our honeymoon going to start"

## 2020-08-25 NOTE — Progress Notes (Signed)
Today at 1245 patient left unit telling Alease Frame CNA that Jehovah told him to leave and he would kill anybody who got in his way. Security and [employer] responded. Patient was brought back to his room and has remained there without incident since that time

## 2020-08-26 NOTE — Progress Notes (Signed)
PROGRESS NOTE    Henry Smith  AUQ:[id number] DOB: 01-Oct-1941 DOA: 07/03/2020 PCP: Caprice Renshaw, MD   Brief Narrative:  79 year old male with a history of neurosyphilis, dementia, schizoaffective disorder, COPD, thrombocytopenia, seizure disorder, who is a long-term resident of a nursing facility, was brought to the hospital with progressive changes in mental status. Patient had been coming increasingly aggressive, was refusing any medications, becoming paranoid. More recently, staff had noted that he was drinking his urine. He was evaluated in the emergency room and felt that he may have a urinary tract infection was admitted to the hospital service. Further review of urine studies indicate that his positive urine culture is likely a colonization and not a true infection. He did not have any other signs of active infection. Discussed with infectious disease who agreed with not treating with antibiotics. It was felt that his progressive decline in mental status was related to his underlying behavioral health. Seen by psychiatry with recommendations for inpatient psychiatry. He is involuntarily committed. Patient is medically stable for discharge whenever he can be placed by psychiatry.  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 He has been refusing his medications for weeks to months.He is on several psychotropic medications such as Depakote, Haldol, Ativan. He also takes Lamictal and Keppra for seizures. His behavior changes appear to be chronic  progressive in nature and appear to be secondary to his underlying mental illness. Other work-up including CT imaging of head, basic chemistry, ammonia, TSH, ABG were unrevealing. Appreciate psychiatry input. Recommendations are for inpatient psychiatry. He is currently under involuntary commitment.---- -Inpatient psychiatric admission to a geropsychiatricfacility continues to be recommended -Patient is medically stable for discharge to inpatient psychiatric/behavioral unit when bed becomes available  --- Poor compliance with medications and persistent psychotic type behavior disturbance persist -Continue to follow psychiatry service for medications adjustment and further recommendations -Continue treatment as per Psychiatry recommendations.  After further eval by psych provider-- changes to pt's regimen----  --Depakote was dced bcos it is difficult to monitor levels (pt is not always cooperative with lab draws)--- --Scheduled--c/n Amantadine, Namenda, Haldol, Prozac , Clonidine patch and Tegretol  Prn-- Im Haldol and im Ativan ordered  2)HTN--BP isnow at goal -will continue oral amlodipine and clonidine patch  3)Dementia Associated with Neurosyphilis--- Review of records indicate that he was treated for neurosyphilis in 2018.  -He is chronically on Namenda which he has been refusing intermittently.  4)Thrombocytopenia/pancytopenia--patient with some degree of mild pancytopenia which is stable-- Appears to be a chronic issue. -No signs of overt bleeding.  5)Seizure disorder---- Chronically on Keppra and Lamictal.  --He has been refusing these medications. He has IM Ativan as needed ordered if he has any breakthrough seizures.  6)Noncompliance---- Patient continues refusing medications intermittently.  DVT prophylaxis:heparin injection 5,000 Units Start: 07/04/20 0600 SCDs Start: 07/04/20 0130  Code Status:full code Family Communication:No family  present Disposition Plan:Status is: Inpatient  Remains inpatient appropriate because:Unsafe d/c plan   Dispo: The patient is from: SNF Anticipated d/c is to: Inpatient psychiatry Anticipated d/c date is: when ever there is a bed at psych facility.  Patient  currently is medically stable to d/c. ----Inpatient psychiatric admission to a geropsychiatricfacility continues to be recommended -Patient is medically stable for discharge to inpatient psychiatric/behavioral unit when bed becomes available. Patient continued to be first priority on waiting list at [facility].    Consultants:  Psychiatry  Procedures:  See below for x-ray reports.  Antimicrobials:   None currently (has completed antibiotics for UTI).  Subjective: Patient seen and evaluated today and is noted to be somnolent.  He has tried to leave the hospital several times overnight and has been slightly more agitated.  Objective: Vitals:   08/23/20 0114 08/23/20 0610 08/23/20 1600 08/25/20 0644  BP: (!) 147/77 127/70 134/76 (!) 157/86  Pulse: 83 64 71 71  Resp: 16 17 18 20   Temp: 97.8 F (36.6 C) 97.9 F (36.6 C) 98.4 F (36.9 C) 98.6 F (37 C)  TempSrc: Oral Oral Oral Oral  SpO2: 99% 94% 97% 97%  Weight:      Height:        Intake/Output Summary (Last 24 hours) at 08/26/2020 1349 Last data filed at 08/26/2020 0700 Gross per 24 hour  Intake 720 ml  Output --  Net 720 ml   Filed Weights   07/03/20 1504 07/04/20 0200 08/11/20 2312  Weight: 92.5 kg 97.3 kg 99 kg    Examination:  General exam: Appears calm and comfortable, somnolent Respiratory system: Clear to auscultation. Respiratory effort normal. Cardiovascular system: S1 & S2 heard, RRR. Gastrointestinal system: Abdomen is nondistended, soft and nontender.  Central nervous system: Somnolent Extremities: No edema Skin: No rashes, lesions or ulcers Psychiatry: Difficult to assess    Data Reviewed:  I have personally reviewed following labs and imaging studies  CBC: No results for input(s): WBC, NEUTROABS, HGB, HCT, MCV, PLT in the last 168 hours. Basic Metabolic Panel: No results for input(s): NA, K, CL, CO2, GLUCOSE, BUN, CREATININE, CALCIUM, MG, PHOS in the last 168 hours. GFR: Estimated Creatinine Clearance: 92.8 mL/min (by C-G formula based on SCr of 0.77 mg/dL). Liver Function Tests: No results for input(s): AST, ALT, ALKPHOS, BILITOT, PROT, ALBUMIN in the last 168 hours. No results for input(s): LIPASE, AMYLASE in the last 168 hours. No results for input(s): AMMONIA in the last 168 hours. Coagulation Profile: No results for input(s): INR, PROTIME in the last 168 hours. Cardiac Enzymes: No results for input(s): CKTOTAL, CKMB, CKMBINDEX, TROPONINI in the last 168 hours. BNP (last 3 results) No results for input(s): PROBNP in the last 8760 hours. HbA1C: No results for input(s): HGBA1C in the last 72 hours. CBG: No results for input(s): GLUCAP in the last 168 hours. Lipid Profile: No results for input(s): CHOL, HDL, LDLCALC, TRIG, CHOLHDL, LDLDIRECT in the last 72 hours. Thyroid Function Tests: No results for input(s): TSH, T4TOTAL, FREET4, T3FREE, THYROIDAB in the last 72 hours. Anemia Panel: No results for input(s): VITAMINB12, FOLATE, FERRITIN, TIBC, IRON, RETICCTPCT in the last 72 hours. Sepsis Labs: No results for input(s): PROCALCITON, LATICACIDVEN in the last 168 hours.  No results found for this or any previous visit (from the past 240 hour(s)).       Radiology Studies: No results found.      Scheduled Meds: . amantadine  100 mg Oral BID  . amLODipine  5 mg Oral Daily  . carbamazepine  100 mg Oral QPC breakfast  . carbamazepine  200 mg Oral BID  . cloNIDine  0.2 mg Transdermal Weekly  . FLUoxetine  40 mg Oral Daily  . folic acid  1 mg  Oral Daily  . haloperidol  2 mg Oral TID  . heparin  5,000 Units Subcutaneous Q8H  . levETIRAcetam  500 mg Oral  BID  . memantine  10 mg Oral QHS  . nicotine  7 mg Transdermal Daily  . [START ON 09/08/2020] paliperidone  234 mg Intramuscular Q28 days  . vitamin B-12  500 mcg Oral Daily    LOS: 53 days    Time spent: 30 minutes    Millisa Giarrusso Darleen Crocker, DO [facility]  If 7PM-7AM, please contact night-coverage [email or web address] 08/26/2020, 1:49 PM

## 2020-08-27 NOTE — Progress Notes (Signed)
PROGRESS NOTE    Henry Smith  HYQ:[id number] DOB: 1941-11-03 DOA: 07/03/2020 PCP: Caprice Renshaw, MD   Brief Narrative:  79 year old male with a history of neurosyphilis, dementia, schizoaffective disorder, COPD, thrombocytopenia, seizure disorder, who is a long-term resident of a nursing facility, was brought to the hospital with progressive changes in mental status. Patient had been coming increasingly aggressive, was refusing any medications, becoming paranoid. More recently, staff had noted that he was drinking his urine. He was evaluated in the emergency room and felt that he may have a urinary tract infection was admitted to the hospital service. Further review of urine studies indicate that his positive urine culture is likely a colonization and not a true infection. He did not have any other signs of active infection. Discussed with infectious disease who agreed with not treating with antibiotics. It was felt that his progressive decline in mental status was related to his underlying behavioral health. Seen by psychiatry with recommendations for inpatient psychiatry. He is involuntarily committed. Patient is medically stable for discharge whenever he can be placed by psychiatry.  -Patient is independent with ADLs, ambulatory  Assessment & Plan:   Principal Problem:   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[name]) Active Problems:   Thrombocytopenia (HCC)   Schizoaffective disorder, bipolar type ([place])   Seizure ([place])   Noncompliance with medication regimen   Acute metabolic encephalopathy   1)Progressive Behavioral Changes--- Initially felt to be related to possible UTI, but patient likely has colonization and not a true urinary tract infection. He has been refusing his medications for weeks to months.He is on several psychotropic medications such as Depakote, Haldol, Ativan. He also takes Lamictal and Keppra for seizures. His behavior changes appear to be chronic  progressive in nature and appear to be secondary to his underlying mental illness. Other work-up including CT imaging of head, basic chemistry, ammonia, TSH, ABG were unrevealing. Appreciate psychiatry input. Recommendations are for inpatient psychiatry. He is currently under involuntary commitment.---- -Inpatient psychiatric admission to a geropsychiatricfacility continues to be recommended -Patient is medically stable for discharge to inpatient psychiatric/behavioral unit when bed becomes available  --- Poor compliance with medications and persistent psychotic type behavior disturbance persist -Continue to follow psychiatry service for medications adjustment and further recommendations -Continue treatment as per Psychiatry recommendations.  After further eval by psych provider-- changes to pt's regimen----  --Depakote was dced bcos it is difficult to monitor levels (pt is not always cooperative with lab draws)--- --Scheduled--c/n Amantadine, Namenda, Haldol, Prozac , Clonidine patch and Tegretol  Prn-- Im Haldol and im Ativan ordered  2)HTN--BP isnow at goal -will continue oral amlodipine and clonidine patch  3)Dementia Associated with Neurosyphilis--- Review of records indicate that he was treated for neurosyphilis in 2018.  -He is chronically on Namenda which he has been refusing intermittently.  4)Thrombocytopenia/pancytopenia--patient with some degree of mild pancytopenia which is stable-- Appears to be a chronic issue. -No signs of overt bleeding.  5)Seizure disorder---- Chronically on Keppra and Lamictal.  --He has been refusing these medications. He has IM Ativan as needed ordered if he has any breakthrough seizures.  6)Noncompliance---- Patient continues refusing medications intermittently.  DVT prophylaxis:heparin injection 5,000 Units Start: 07/04/20 0600 SCDs Start: 07/04/20 0130  Code Status:full code Family Communication:No family  present Disposition Plan:Status is: Inpatient  Remains inpatient appropriate because:Unsafe d/c plan   Dispo: The patient is from: SNF Anticipated d/c is to: Inpatient psychiatry Anticipated d/c date is: when ever there is a bed at psych facility.  Patient currently  is medically stable to d/c. ----Inpatient psychiatric admission to a geropsychiatricfacility continues to be recommended -Patient is medically stable for discharge to inpatient psychiatric/behavioral unit when bed becomes available. Patient continued to be first priority on waiting list at [facility].    Consultants:  Psychiatry  Procedures:  See below for x-ray reports.  Antimicrobials:   None currently (has completed antibiotics for UTI).  Subjective: Patient seen and evaluated today and is noted to be somnolent on my evaluation.  He was noted to have an episode of psychosis with agitation earlier this morning and was yelling at staff.  He was also throwing water on staff Haldol and Ativan were given and patient is currently somnolent.  Objective: Vitals:   08/23/20 0610 08/23/20 1600 08/25/20 0644 08/27/20 0654  BP: 127/70 134/76 (!) 157/86 (!) 147/90  Pulse: 64 71 71 84  Resp: 17 18 20    Temp: 97.9 F (36.6 C) 98.4 F (36.9 C) 98.6 F (37 C) 97.7 F (36.5 C)  TempSrc: Oral Oral Oral Oral  SpO2: 94% 97% 97% 98%  Weight:      Height:        Intake/Output Summary (Last 24 hours) at 08/27/2020 1128 Last data filed at 08/27/2020 0513 Gross per 24 hour  Intake 820 ml  Output 100 ml  Net 720 ml   Filed Weights   07/03/20 1504 07/04/20 0200 08/11/20 2312  Weight: 92.5 kg 97.3 kg 99 kg    Examination:  General exam: Somnolent Respiratory system: Clear to auscultation. Respiratory effort normal. Cardiovascular system: S1 & S2 heard, RRR.  Gastrointestinal system: Abdomen is nondistended, soft and nontender.  Central nervous system:  Somnolent Extremities: No edema Skin: No rashes, lesions or ulcers Psychiatry: Cannot be assessed given current condition    Data Reviewed: I have personally reviewed following labs and imaging studies  CBC: No results for input(s): WBC, NEUTROABS, HGB, HCT, MCV, PLT in the last 168 hours. Basic Metabolic Panel: No results for input(s): NA, K, CL, CO2, GLUCOSE, BUN, CREATININE, CALCIUM, MG, PHOS in the last 168 hours. GFR: Estimated Creatinine Clearance: 92.8 mL/min (by C-G formula based on SCr of 0.77 mg/dL). Liver Function Tests: No results for input(s): AST, ALT, ALKPHOS, BILITOT, PROT, ALBUMIN in the last 168 hours. No results for input(s): LIPASE, AMYLASE in the last 168 hours. No results for input(s): AMMONIA in the last 168 hours. Coagulation Profile: No results for input(s): INR, PROTIME in the last 168 hours. Cardiac Enzymes: No results for input(s): CKTOTAL, CKMB, CKMBINDEX, TROPONINI in the last 168 hours. BNP (last 3 results) No results for input(s): PROBNP in the last 8760 hours. HbA1C: No results for input(s): HGBA1C in the last 72 hours. CBG: No results for input(s): GLUCAP in the last 168 hours. Lipid Profile: No results for input(s): CHOL, HDL, LDLCALC, TRIG, CHOLHDL, LDLDIRECT in the last 72 hours. Thyroid Function Tests: No results for input(s): TSH, T4TOTAL, FREET4, T3FREE, THYROIDAB in the last 72 hours. Anemia Panel: No results for input(s): VITAMINB12, FOLATE, FERRITIN, TIBC, IRON, RETICCTPCT in the last 72 hours. Sepsis Labs: No results for input(s): PROCALCITON, LATICACIDVEN in the last 168 hours.  No results found for this or any previous visit (from the past 240 hour(s)).       Radiology Studies: No results found.      Scheduled Meds: . amantadine  100 mg Oral BID  . amLODipine  5 mg Oral Daily  . carbamazepine  100 mg Oral QPC breakfast  . carbamazepine  200 mg Oral  BID  . cloNIDine  0.2 mg Transdermal Weekly  . FLUoxetine  40 mg  Oral Daily  . folic acid  1 mg Oral Daily  . haloperidol  2 mg Oral TID  . heparin  5,000 Units Subcutaneous Q8H  . levETIRAcetam  500 mg Oral BID  . memantine  10 mg Oral QHS  . nicotine  7 mg Transdermal Daily  . [START ON 09/08/2020] paliperidone  234 mg Intramuscular Q28 days  . vitamin B-12  500 mcg Oral Daily     LOS: 54 days    Time spent: 30 minutes    Abegail Kloeppel Darleen Crocker, DO [facility]  If 7PM-7AM, please contact night-coverage [email or web address] 08/27/2020, 11:28 AM

## 2020-08-27 NOTE — TOC Progression Note (Signed)
Transition of Care [facility]) - Progression Note    Patient Details  Name: Henry Smith MRN: [id number] Date of Birth: [birth year]  [facility]) CM/SW Contact  Natasha Bence, LCSW Phone Number: 08/27/2020, 6:37 PM  Clinical Narrative:    CSW faxed behavior to [facility] and resubmitted IVC paperwork to magistrate. [place] Fax [phone] TOC to follow.   Expected Discharge Plan: Psychiatric Hospital Barriers to Discharge: Psych Bed not available  Expected Discharge Plan and Services Expected Discharge Plan: Psychiatric Hospital In-house Referral: Clinical Social Work     Living arrangements for the past 2 months: [place] Expected Discharge Date: 07/10/20                                     Social Determinants of Health (SDOH) Interventions    Readmission Risk Interventions Readmission Risk Prevention Plan 07/10/2020  Transportation Screening Complete  HRI or [place] Complete  Social Work Consult for [place] Planning/Counseling Complete  Palliative Care Screening Not Applicable  Medication Review Press photographer) Complete  Some recent data might be hidden

## 2020-08-27 NOTE — B[facility] Counselor (Signed)
TTS contacted [id number] to set up tele-psych. Per Izora Gala, RN patient sleeping at this time and unable to wake up to participate in assessment.

## 2020-08-27 NOTE — Progress Notes (Addendum)
The patient is having psychosis, agitated/impulsive and anxiety. He's throwing water at  Staff and yelling on top of his voice. The whole floor soaked as well as the bathroom. Security called and present at this time. Haldol  IM as needed administered without any effect. Will go ahead and give Ativan IM. Notified Dr. Hanley Ben  Via text with no call back. Will continue to monitor.

## 2020-08-27 NOTE — Progress Notes (Signed)
Late entry for 08/27/20 1730. Patient left room and began wandering in hallway. Walked past nurses station and around to emergency stairwell exit next to room 305. Nursing staff followed patient and attempted to redirect him to his room. Charge nurse called as well as Teacher, music. Camera operator, sitter, assigned RN and two nurse techs followed patient out emergency exit. Patient sat down on stairs and refused to return to room. Nursing supervisor and security arrived, patient then agreed to return to room for some food. Patient assisted back to his room. Redirected and calmed with chocolate milk from supper tray and music playing. Sitter remained with patient as ordered. Nursing staff remained on alert for safety.

## 2020-08-28 NOTE — Progress Notes (Signed)
Patient left room and walked to emergency stairwell exit next to room 305. Nursing staff and security followed patient to exit. Three security guards assisted patient back to room. Patient then grabbed a cup of ice cream and placed his penis in the cup stating he was going to "jack off with it". Ice cream cup obtained and placed in trash.

## 2020-08-28 NOTE — TOC Progression Note (Signed)
Transition of Care [facility]) - Progression Note    Patient Details  Name: Henry Smith MRN: [id number] Date of Birth: Jan 25, 1941  Transition of Care [facility]) CM/SW Contact  Shade Flood, LCSW Phone Number: 08/28/2020, 11:22 AM  Clinical Narrative:     TOC following. Updated progress notes regarding pt's behavioral concerns faxed to [facility] this AM. Awaiting bed availability.  Expected Discharge Plan: Psychiatric Hospital Barriers to Discharge: Psych Bed not available  Expected Discharge Plan and Services Expected Discharge Plan: Psychiatric Hospital In-house Referral: Clinical Social Work     Living arrangements for the past 2 months: [place] Expected Discharge Date: 07/10/20                                     Social Determinants of Health (SDOH) Interventions    Readmission Risk Interventions Readmission Risk Prevention Plan 07/10/2020  Transportation Screening Complete  HRI or [place] Complete  Social Work Consult for [place] Planning/Counseling Complete  Palliative Care Screening Not Applicable  Medication Review Press photographer) Complete  Some recent data might be hidden

## 2020-08-28 NOTE — Progress Notes (Signed)
PROGRESS NOTE    Henry Smith  WCH:[id number] DOB: 02/11/1941 DOA: 07/03/2020 PCP: Caprice Renshaw, MD   Brief Narrative:  79 year old male with a history of neurosyphilis, dementia, schizoaffective disorder, COPD, thrombocytopenia, seizure disorder, who is a long-term resident of a nursing facility, was brought to the hospital with progressive changes in mental status. Patient had been coming increasingly aggressive, was refusing any medications, becoming paranoid. More recently, staff had noted that he was drinking his urine. He was evaluated in the emergency room and felt that he may have a urinary tract infection was admitted to the hospital service. Further review of urine studies indicate that his positive urine culture is likely a colonization and not a true infection. He did not have any other signs of active infection. Discussed with infectious disease who agreed with not treating with antibiotics. It was felt that his progressive decline in mental status was related to his underlying behavioral health. Seen by psychiatry with recommendations for inpatient psychiatry. He is involuntarily committed. Patient is medically stable for discharge whenever he can be placed by psychiatry.  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 He has been refusing his medications for months.He is on several psychotropic medications such as Depakote, Haldol, Ativan. He also takes Lamictal and Keppra for seizures. His behavior changes appear to be chronic progressive  in nature and appear to be secondary to his underlying mental illness. Other work-up including CT imaging of head, basic chemistry, ammonia, TSH, ABG were unrevealing. Appreciate psychiatry input. Recommendations are for inpatient psychiatry. He is currently under involuntary commitment.---- -Inpatient psychiatric admission to a geropsychiatricfacility continues to be recommended -Patient is medically stable for discharge to inpatient psychiatric/behavioral unit when bed becomes available  --- Poor compliance with medications and persistent psychotic type behavior disturbance persist -Continue to follow psychiatry service for medications adjustment and further recommendations -Continue treatment as per Psychiatry recommendations.  After further eval by psych provider-- changes to pt's regimen----  --Depakote was dc'd bcos it is difficult to monitor levels (pt is not always cooperative with lab draws)--- --Scheduled--c/n Amantadine, Namenda, Haldol, Prozac , Clonidine patch and Tegretol  Prn-- Im Haldol and im Ativan ordered  2)HTN--BP isnow at goal -will continue oral amlodipine and clonidine patch  3)Dementia Associated with Neurosyphilis---Review of records indicate that he was treated for neurosyphilis in 2018.  -He is chronically on Namenda which he has been refusing intermittently.  4)Thrombocytopenia/pancytopenia--patient with some degree of mild pancytopenia which is stable-- Appears to be a chronic issue. -No signs of overt bleeding.  5)Seizure disorder---- Chronically on Keppra and Lamictal.  --He has been refusing these medications. He has IM Ativan as needed ordered if he has any breakthrough seizures.  6)Noncompliance---- Patient continues refusing medications intermittently.  DVT prophylaxis:heparin injection 5,000 Units Start: 07/04/20 0600 SCDs Start: 07/04/20 0130  Code Status:full code Family Communication:No family present Disposition  Plan:Status is: Inpatient  Remains inpatient appropriate because:Unsafe d/c plan   Dispo: The patient is from: SNF Anticipated d/c is to: Inpatient psychiatry Anticipated d/c date is: when ever there is a bed at psych facility.  Patient currently is medically stable  to d/c. --Inpatient psychiatric admission to a geropsychiatricfacility continues to be recommended -Patient is medically stable for discharge to inpatient psychiatric/behavioral unit when bed becomes available. Patient continued to be first priority on waiting list at [facility].    Consultants:  Psychiatry  Procedures:  See below for x-ray reports.  Antimicrobials:   None currently (has completed antibiotics for UTI).  Subjective: Patient seen and evaluated today and is noted to be somnolent on my evaluation.  He was noted to have an episode of psychosis with agitation earlier this morning and was yelling at staff.  He was also throwing water on staff Haldol and Ativan were given and patient is currently somnolent.  Objective: Vitals:   08/27/20 0654 08/27/20 1500 08/27/20 2051 08/28/20 0537  BP: (!) 147/90 (!) 169/96 102/83 (!) 130/94  Pulse: 84 82 99 (!) 104  Resp:  20 20 18   Temp: 97.7 F (36.5 C) 98 F (36.7 C)  98.2 F (36.8 C)  TempSrc: Oral Axillary  Oral  SpO2: 98% 99% 93% 99%  Weight:      Height:        Intake/Output Summary (Last 24 hours) at 08/28/2020 1158 Last data filed at 08/28/2020 0900 Gross per 24 hour  Intake 960 ml  Output --  Net 960 ml   Filed Weights   07/03/20 1504 07/04/20 0200 08/11/20 2312  Weight: 92.5 kg 97.3 kg 99 kg   Examination:  General exam: awake, sitting up in chair, NAD.  Respiratory system: Clear to auscultation. Respiratory effort normal. Cardiovascular system: normal S1 & S2 heard.  Gastrointestinal system: Abdomen is nondistended, soft and nontender.  Central nervous system: nonfocal exam.  Extremities: No  edema Skin: No rashes, lesions or ulcers Psychiatry: Cannot be assessed given current condition  Data Reviewed: I have personally reviewed following labs and imaging studies  CBC: No results for input(s): WBC, NEUTROABS, HGB, HCT, MCV, PLT in the last 168 hours. Basic Metabolic Panel: No results for input(s): NA, K, CL, CO2, GLUCOSE, BUN, CREATININE, CALCIUM, MG, PHOS in the last 168 hours. GFR: Estimated Creatinine Clearance: 92.8 mL/min (by C-G formula based on SCr of 0.77 mg/dL). Liver Function Tests: No results for input(s): AST, ALT, ALKPHOS, BILITOT, PROT, ALBUMIN in the last 168 hours. No results for input(s): LIPASE, AMYLASE in the last 168 hours. No results for input(s): AMMONIA in the last 168 hours. Coagulation Profile: No results for input(s): INR, PROTIME in the last 168 hours. Cardiac Enzymes: No results for input(s): CKTOTAL, CKMB, CKMBINDEX, TROPONINI in the last 168 hours. BNP (last 3 results) No results for input(s): PROBNP in the last 8760 hours. HbA1C: No results for input(s): HGBA1C in the last 72 hours. CBG: No results for input(s): GLUCAP in the last 168 hours. Lipid Profile: No results for input(s): CHOL, HDL, LDLCALC, TRIG, CHOLHDL, LDLDIRECT in the last 72 hours. Thyroid Function Tests: No results for input(s): TSH, T4TOTAL, FREET4, T3FREE, THYROIDAB in the last 72 hours. Anemia Panel: No results for input(s): VITAMINB12, FOLATE, FERRITIN, TIBC, IRON, RETICCTPCT in the last 72 hours. Sepsis Labs: No results for input(s): PROCALCITON, LATICACIDVEN in the last 168 hours.  No results found for this or any previous visit (from the past 240 hour(s)).  Radiology Studies: No results found. Scheduled Meds:  amantadine  100 mg Oral BID   amLODipine  5 mg Oral Daily   carbamazepine  100 mg Oral QPC breakfast   carbamazepine  200 mg Oral BID   cloNIDine  0.2 mg Transdermal Weekly  FLUoxetine  40 mg Oral Daily   folic acid  1 mg Oral Daily    haloperidol  2 mg Oral TID   heparin  5,000 Units Subcutaneous Q8H   levETIRAcetam  500 mg Oral BID   memantine  10 mg Oral QHS   nicotine  7 mg Transdermal Daily   [START ON 09/08/2020] paliperidone  234 mg Intramuscular Q28 days   vitamin B-12  500 mcg Oral Daily     LOS: 55 days   Time spent: 30 minutes  Ghada Abbett Wynetta Emery, MD How to contact the [facility] Attending or Consulting provider [place] or covering provider during after hours [place], for this patient?  1. Check the care team in [facility] and look for a) attending/consulting TRH provider listed and b) the [facility] team listed 2. Log into [email or web address] and use [facility] [place] universal password to access. If you do not have the password, please contact the hospital operator. 3. Locate the [facility] provider you are looking for under [facility] and page to a number that you can be directly reached. 4. If you still have difficulty reaching the provider, please page the [facility] (Director on Call) for the Hospitalists listed on amion for assistance.   If 7PM-7AM, please contact night-coverage [email or web address] 08/28/2020, 11:58 AM

## 2020-08-28 NOTE — Progress Notes (Signed)
Patient has proceeded out of room to stairwell exits at room 305 and room 324. Each time making growling-like noises. Security assisted back to room, as well as accompanied by nursing staff. Pt given PRN Ativan.

## 2020-08-29 NOTE — Progress Notes (Signed)
PROGRESS NOTE    Henry Smith  NOI:[id number] DOB: [birth year] DOA: 07/03/2020 PCP: Caprice Renshaw, MD   Brief Narrative:  79 year old male with a history of neurosyphilis, dementia, schizoaffective disorder, COPD, thrombocytopenia, seizure disorder, who is a long-term resident of a nursing facility, was brought to the hospital with progressive changes in mental status. Patient had been coming increasingly aggressive, was refusing any medications, becoming paranoid. More recently, staff had noted that he was drinking his urine. He was evaluated in the emergency room and felt that he may have a urinary tract infection was admitted to the hospital service. Further review of urine studies indicate that his positive urine culture is likely a colonization and not a true infection. He did not have any other signs of active infection. Discussed with infectious disease who agreed with not treating with antibiotics. It was felt that his progressive decline in mental status was related to his underlying behavioral health. Seen by psychiatry with recommendations for inpatient psychiatry. He is involuntarily committed. Patient is medically stable for discharge whenever he can be placed by psychiatry.  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 He has been refusing his medications for months.He is on several psychotropic medications such as Depakote, Haldol, Ativan. He also takes Lamictal and Keppra for seizures. His behavior changes appear to be chronic progressive  in nature and appear to be secondary to his underlying mental illness. Other work-up including CT imaging of head, basic chemistry, ammonia, TSH, ABG were unrevealing. Appreciate psychiatry input. Recommendations are for inpatient psychiatry. He is currently under involuntary commitment.---- -Inpatient psychiatric admission to a geropsychiatricfacility continues to be recommended - Patient is medically stable for discharge to inpatient psychiatric/behavioral unit when bed becomes available  --- Poor compliance with medications and persistent psychotic type behavior disturbance persist -Continue to follow psychiatry service for medications adjustment and further recommendations -Continue treatment as per Psychiatry recommendations.  After further eval by psych provider-- changes to pt's regimen----  --Depakote was dc'd bcos it is difficult to monitor levels (pt is not always cooperative with lab draws)--- --Scheduled--c/n Amantadine, Namenda, Haldol, Prozac , Clonidine patch and Tegretol  Prn-- Im Haldol and im Ativan ordered  2)HTN--BP isnow at goal -will continue oral amlodipine and clonidine patch  3)Dementia Associated with Neurosyphilis---Review of records indicate that he was treated for neurosyphilis in 2018.  -He is chronically on Namenda which he has been refusing intermittently.  4)Thrombocytopenia/pancytopenia--patient with some degree of mild pancytopenia which is stable-- Appears to be a chronic issue. -No signs of overt bleeding.  5)Seizure disorder---- Chronically on Keppra and Lamictal.  --He has been refusing these medications. He has IM Ativan as needed ordered if he has any breakthrough seizures.  6)Noncompliance---- Patient continues refusing medications intermittently.  DVT prophylaxis:heparin injection 5,000 Units Start: 07/04/20 0600 SCDs Start: 07/04/20 0130  Code Status:full code Family Communication:No family present Disposition  Plan:Status is: Inpatient  Remains inpatient appropriate because:Unsafe d/c plan  Dispo: The patient is from: SNF Anticipated d/c is to: Inpatient psychiatry Anticipated d/c date is: when ever there is a bed at psych facility.  Patient currently is medically stable  to d/c. --Inpatient psychiatric admission to a geropsychiatricfacility continues to be recommended -Patient is medically stable for discharge to inpatient psychiatric/behavioral unit when bed becomes available. Patient continued to be first priority on waiting list at [facility]    Consultants:  Psychiatry  Procedures:  See below for x-ray reports.  Antimicrobials:   None currently (has completed antibiotics for UTI).  Subjective: Patient seen and evaluated today and is noted to be somnolent on my evaluation.  He was noted to have an episode of psychosis with agitation earlier this morning and was yelling at staff.  He was also throwing water on staff Haldol and Ativan were given and patient is currently somnolent.  Objective: Vitals:   08/27/20 1500 08/27/20 2051 08/28/20 0537 08/29/20 0116  BP: (!) 169/96 102/83 (!) 130/94 (!) 155/86  Pulse: 82 99 (!) 104 96  Resp: 20 20 18 20   Temp: 98 F (36.7 C)  98.2 F (36.8 C) 98.5 F (36.9 C)  TempSrc: Axillary  Oral Oral  SpO2: 99% 93% 99% 100%  Weight:    98.8 kg  Height:        Intake/Output Summary (Last 24 hours) at 08/29/2020 1336 Last data filed at 08/29/2020 0118 Gross per 24 hour  Intake 480 ml  Output --  Net 480 ml   Filed Weights   07/04/20 0200 08/11/20 2312 08/29/20 0116  Weight: 97.3 kg 99 kg 98.8 kg   Examination:  General exam: awake, sitting up in chair, NAD.  Respiratory system: Clear to auscultation. Respiratory effort normal. Cardiovascular system: normal S1 & S2 heard.  Gastrointestinal system: Abdomen is nondistended, soft and nontender.  Central nervous system: nonfocal exam.   Extremities: No edema Skin: No rashes, lesions or ulcers Psychiatry: Cannot be assessed given current condition  Data Reviewed: I have personally reviewed following labs and imaging studies  CBC: No results for input(s): WBC, NEUTROABS, HGB, HCT, MCV, PLT in the last 168 hours. Basic Metabolic Panel: No results for input(s): NA, K, CL, CO2, GLUCOSE, BUN, CREATININE, CALCIUM, MG, PHOS in the last 168 hours. GFR: Estimated Creatinine Clearance: 92.6 mL/min (by C-G formula based on SCr of 0.77 mg/dL). Liver Function Tests: No results for input(s): AST, ALT, ALKPHOS, BILITOT, PROT, ALBUMIN in the last 168 hours. No results for input(s): LIPASE, AMYLASE in the last 168 hours. No results for input(s): AMMONIA in the last 168 hours. Coagulation Profile: No results for input(s): INR, PROTIME in the last 168 hours. Cardiac Enzymes: No results for input(s): CKTOTAL, CKMB, CKMBINDEX, TROPONINI in the last 168 hours. BNP (last 3 results) No results for input(s): PROBNP in the last 8760 hours. HbA1C: No results for input(s): HGBA1C in the last 72 hours. CBG: No results for input(s): GLUCAP in the last 168 hours. Lipid Profile: No results for input(s): CHOL, HDL, LDLCALC, TRIG, CHOLHDL, LDLDIRECT in the last 72 hours. Thyroid Function Tests: No results for input(s): TSH, T4TOTAL, FREET4, T3FREE, THYROIDAB in the last 72 hours. Anemia Panel: No results for input(s): VITAMINB12, FOLATE, FERRITIN, TIBC, IRON, RETICCTPCT in the last 72 hours. Sepsis Labs: No results for input(s): PROCALCITON, LATICACIDVEN in the last 168 hours.  No results found for this or any previous visit (from the past 240 hour(s)).  Radiology Studies: No results found. Scheduled Meds:  amantadine  100 mg Oral BID   amLODipine  5 mg Oral Daily   carbamazepine  100 mg Oral QPC breakfast   carbamazepine  200 mg Oral BID   cloNIDine  0.2 mg Transdermal Weekly  FLUoxetine  40 mg Oral Daily   folic acid  1 mg  Oral Daily   haloperidol  2 mg Oral TID   heparin  5,000 Units Subcutaneous Q8H   levETIRAcetam  500 mg Oral BID   memantine  10 mg Oral QHS   nicotine  7 mg Transdermal Daily   [START ON 09/08/2020] paliperidone  234 mg Intramuscular Q28 days   vitamin B-12  500 mcg Oral Daily     LOS: 56 days   Time spent: 30 minutes  Jaren Vanetten Wynetta Emery, MD How to contact the [facility] Attending or Consulting provider [place] or covering provider during after hours [place], for this patient?  1. Check the care team in [facility] and look for a) attending/consulting TRH provider listed and b) the [facility] team listed 2. Log into [email or web address] and use [place] universal password to access. If you do not have the password, please contact the hospital operator. 3. Locate the [facility] provider you are looking for under [facility] and page to a number that you can be directly reached. 4. If you still have difficulty reaching the provider, please page the [facility] (Director on Call) for the Hospitalists listed on amion for assistance.   If 7PM-7AM, please contact night-coverage [email or web address] 08/29/2020, 1:36 PM

## 2020-08-29 NOTE — BH Assessment (Signed)
Sent secure chat msg to coordinate TTS reassessment. Pt's RN offered to wake pt. RN agreeable to alert TTS when pt is awake instead.

## 2020-08-29 NOTE — BH Assessment (Signed)
Patient continues to meet inpatient criteria and remains priority status for [facility] wait list, confirmed with Baxter Flattery @ [phone].  Confirmed with Museum/gallery conservator at [facility] that patient is on the wait list. [facility] is not expecting any [place] discharges today.   Disposition Counselor will follow up with each facility 08/30/2020 to inquire about bed availability.

## 2020-08-30 NOTE — BH Assessment (Signed)
Patient continues to meet inpatient criteria and remains priority status for [facility] wait list, confirmed with [facility] @ [phone].  [facility] to confirm that patient is on the wait list @ [phone]. Left message. Per Malachy Mood, no one is available but will give message to admissions/intake counselor.    Disposition Counselor/LCSW will continue to follow up with patient placement.

## 2020-08-30 NOTE — Progress Notes (Signed)
PROGRESS NOTE    Henry Smith  GLO:[id number] DOB: 1941-02-26 DOA: 07/03/2020 PCP: Caprice Renshaw, MD   Brief Narrative:  79 year old male with a history of neurosyphilis, dementia, schizoaffective disorder, COPD, thrombocytopenia, seizure disorder, who is a long-term resident of a nursing facility, was brought to the hospital with progressive changes in mental status. Patient had been coming increasingly aggressive, was refusing any medications, becoming paranoid. More recently, staff had noted that he was drinking his urine. He was evaluated in the emergency room and felt that he may have a urinary tract infection was admitted to the hospital service. Further review of urine studies indicate that his positive urine culture is likely a colonization and not a true infection. He did not have any other signs of active infection. Discussed with infectious disease who agreed with not treating with antibiotics. It was felt that his progressive decline in mental status was related to his underlying behavioral health. Seen by psychiatry with recommendations for inpatient psychiatry. He is involuntarily committed. Patient is medically stable for discharge whenever he can be placed by psychiatry.  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 He has been refusing his medications for months.He is on several psychotropic medications such as Depakote, Haldol, Ativan. He also takes Lamictal and Keppra for seizures. His behavior changes appear to be chronic progressive  in nature and appear to be secondary to his underlying mental illness. Other work-up including CT imaging of head, basic chemistry, ammonia, TSH, ABG were unrevealing. Appreciate psychiatry input. Recommendations are for inpatient psychiatry. He is currently under involuntary commitment.---- -Inpatient psychiatric admission to a geropsychiatricfacility continues to be recommended - Patient is medically stable for discharge to inpatient psychiatric/behavioral unit when bed becomes available  --- Poor compliance with medications and persistent psychotic type behavior disturbance persist -Continue to follow psychiatry service for medications adjustment and further recommendations -Continue treatment as per Psychiatry recommendations.  After further eval by psych provider-- changes to pt's regimen----  --Depakote was dc'd bcos it is difficult to monitor levels (pt is not always cooperative with lab draws)--- --Scheduled--c/n Amantadine, Namenda, Haldol, Prozac , Clonidine patch and Tegretol  Prn-- Im Haldol and im Ativan ordered  2)HTN--BP isnow at goal -will continue oral amlodipine and clonidine patch  3)Dementia Associated with Neurosyphilis---Review of records indicate that he was treated for neurosyphilis in 2018.  -He is chronically on Namenda which he has been refusing intermittently.  4)Thrombocytopenia/pancytopenia--patient with some degree of mild pancytopenia which is stable-- Appears to be a chronic issue. -No signs of overt bleeding.  5)Seizure disorder---- Chronically on Keppra and Lamictal.  --He has been refusing these medications. He has IM Ativan as needed ordered if he has any breakthrough seizures.  6)Noncompliance---- Patient continues refusing medications intermittently.  DVT prophylaxis:heparin injection 5,000  Code Status:full code Family Communication:No family present Disposition Plan:Status is: Inpatient  Remains inpatient appropriate  because:Unsafe d/c plan  Dispo: The patient is from: SNF Anticipated d/c is to: Inpatient psychiatry Anticipated d/c date is: when ever there is a bed at psych facility.  Patient currently is medically stable to d/c. --Inpatient psychiatric admission to a geropsychiatricfacility  continues to be recommended -Patient is medically stable for discharge to inpatient psychiatric/behavioral unit when bed becomes available. Patient continued to be first priority on waiting list at [facility].    Consultants:  Psychiatry  Procedures:  See below for x-ray reports.  Antimicrobials:   None currently (has completed antibiotics for UTI).  Subjective: Patient seen and evaluated   Objective: Vitals:   08/28/20 0537 08/29/20 0116 08/29/20 1800 08/30/20 0551  BP: (!) 130/94 (!) 155/86 (!) 144/90 (!) 164/67  Pulse: (!) 104 96 80 77  Resp: 18 20 18 19   Temp: 98.2 F (36.8 C) 98.5 F (36.9 C) 97.8 F (36.6 C) 98 F (36.7 C)  TempSrc: Oral Oral Oral Oral  SpO2: 99% 100% 100% 97%  Weight:  98.8 kg    Height:        Intake/Output Summary (Last 24 hours) at 08/30/2020 1327 Last data filed at 08/30/2020 0830 Gross per 24 hour  Intake 1200 ml  Output --  Net 1200 ml   Filed Weights   07/04/20 0200 08/11/20 2312 08/29/20 0116  Weight: 97.3 kg 99 kg 98.8 kg   Examination:  General exam: awake, sitting up in chair, NAD.  Respiratory system: Clear to auscultation. Respiratory effort normal. Cardiovascular system: normal S1 & S2 heard.  Gastrointestinal system: Abdomen is nondistended, soft and nontender.  Central nervous system: nonfocal exam.  Extremities: No edema Skin: No rashes, lesions or ulcers Psychiatry: Cannot be assessed given current condition  Data Reviewed: I have personally reviewed following labs and imaging studies  CBC: No results for input(s): WBC, NEUTROABS, HGB, HCT, MCV, PLT in the last 168 hours. Basic Metabolic  Panel: No results for input(s): NA, K, CL, CO2, GLUCOSE, BUN, CREATININE, CALCIUM, MG, PHOS in the last 168 hours. GFR: Estimated Creatinine Clearance: 92.6 mL/min (by C-G formula based on SCr of 0.77 mg/dL). Liver Function Tests: No results for input(s): AST, ALT, ALKPHOS, BILITOT, PROT, ALBUMIN in the last 168 hours. No results for input(s): LIPASE, AMYLASE in the last 168 hours. No results for input(s): AMMONIA in the last 168 hours. Coagulation Profile: No results for input(s): INR, PROTIME in the last 168 hours. Cardiac Enzymes: No results for input(s): CKTOTAL, CKMB, CKMBINDEX, TROPONINI in the last 168 hours. BNP (last 3 results) No results for input(s): PROBNP in the last 8760 hours. HbA1C: No results for input(s): HGBA1C in the last 72 hours. CBG: No results for input(s): GLUCAP in the last 168 hours. Lipid Profile: No results for input(s): CHOL, HDL, LDLCALC, TRIG, CHOLHDL, LDLDIRECT in the last 72 hours. Thyroid Function Tests: No results for input(s): TSH, T4TOTAL, FREET4, T3FREE, THYROIDAB in the last 72 hours. Anemia Panel: No results for input(s): VITAMINB12, FOLATE, FERRITIN, TIBC, IRON, RETICCTPCT in the last 72 hours. Sepsis Labs: No results for input(s): PROCALCITON, LATICACIDVEN in the last 168 hours.  No results found for this or any previous visit (from the past 240 hour(s)).  Radiology Studies: No results found. Scheduled Meds: . amantadine  100 mg Oral BID  . amLODipine  5 mg Oral Daily  . carbamazepine  100 mg Oral QPC breakfast  . carbamazepine  200 mg Oral BID  . cloNIDine  0.2 mg Transdermal Weekly  . FLUoxetine  40 mg Oral Daily  . folic acid  1 mg Oral Daily  . haloperidol  2 mg Oral TID  . heparin  5,000 Units Subcutaneous Q8H  . levETIRAcetam  500 mg Oral BID  . memantine  10 mg Oral QHS  .  nicotine  7 mg Transdermal Daily  . [START ON 09/08/2020] paliperidone  234 mg Intramuscular Q28 days  . vitamin B-12  500 mcg Oral Daily     LOS: 57  days   Time spent: 30 minutes  Bernarr Longsworth Wynetta Emery, MD How to contact the [facility] Attending or Consulting provider [place] or covering provider during after hours [place], for this patient?  1. Check the care team in [facility] and look for a) attending/consulting TRH provider listed and b) the [facility] team listed 2. Log into [email or web address] and use [place]'s universal password to access. If you do not have the password, please contact the hospital operator. 3. Locate the [facility] provider you are looking for under [facility] and page to a number that you can be directly reached. 4. If you still have difficulty reaching the provider, please page the [facility] (Director on Call) for the Hospitalists listed on amion for assistance.   If 7PM-7AM, please contact night-coverage [email or web address] 08/30/2020, 1:27 PM

## 2020-08-31 NOTE — Progress Notes (Signed)
Patient asked for something for pain in his shoulder.  Pulled 650 mg Tylenol.  Took to patient, patient refused to take the Tylenol stating I was lying about what the pills are and was trying to trick.  Pills wasted in medication room infront of J. Rhew, RN and T. Marcelino Scot, Therapist, sports.

## 2020-08-31 NOTE — Plan of Care (Signed)

## 2020-08-31 NOTE — Progress Notes (Signed)
No note

## 2020-08-31 NOTE — Progress Notes (Signed)
Pt refused all morning meds and stated "I am going to kick your mother f....... ass if you say anything else about medications and I don't lie". Nursing educated pt and instructed pt to call if he changes his mind. Will notify Dr.

## 2020-08-31 NOTE — Progress Notes (Signed)
PROGRESS NOTE    Henry Smith  ZOX:[id number] DOB: 25-Jun-1941 DOA: 07/03/2020 PCP: Caprice Renshaw, MD   Brief Narrative:  79 year old male with a history of neurosyphilis, dementia, schizoaffective disorder, COPD, thrombocytopenia, seizure disorder, who is a long-term resident of a nursing facility, was brought to the hospital with progressive changes in mental status. Patient had been coming increasingly aggressive, was refusing any medications, becoming paranoid. More recently, staff had noted that he was drinking his urine. He was evaluated in the emergency room and felt that he may have a urinary tract infection was admitted to the hospital service. Further review of urine studies indicate that his positive urine culture is likely a colonization and not a true infection. He did not have any other signs of active infection. Discussed with infectious disease who agreed with not treating with antibiotics. It was felt that his progressive decline in mental status was related to his underlying behavioral health. Seen by psychiatry with recommendations for inpatient psychiatry. He is involuntarily committed. Patient is medically stable for discharge whenever he can be placed by psychiatry.  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 He has been refusing his medications for months.He is on several psychotropic medications such as Depakote, Haldol, Ativan. He also takes Lamictal and Keppra for seizures. His behavior changes appear to be chronic progressive in  nature and appear to be secondary to his underlying mental illness. Other work-up including CT imaging of head, basic chemistry, ammonia, TSH, ABG were unrevealing. Appreciate psychiatry input. Recommendations are for inpatient psychiatry. He is currently under involuntary commitment.---- -Inpatient psychiatric admission to a geropsychiatricfacility continues to be recommended - Patient is medically stable for discharge to inpatient psychiatric/behavioral unit when bed becomes available  --- Poor compliance with medications and persistent psychotic type behavior disturbance persist -Continue to follow psychiatry service for medications adjustment and further recommendations -Continue treatment as per Psychiatry recommendations.  After further eval by psych provider-- changes to pt's regimen----  --Depakote was dc'd bcos it is difficult to monitor levels (pt is not always cooperative with lab draws)--- --Scheduled--c/n Amantadine, Namenda, Haldol, Prozac , Clonidine patch and Tegretol  Prn-- Im Haldol and im Ativan ordered  2)HTN--BP isnow at goal -will continue oral amlodipine and clonidine patch  3)Dementia Associated with Neurosyphilis---Review of records indicate that he was treated for neurosyphilis in 2018.  -He is chronically on Namenda which he has been refusing intermittently.  4)Thrombocytopenia/pancytopenia--patient with some degree of mild pancytopenia which is stable-- Appears to be a chronic issue. -No signs of overt bleeding.  5)Seizure disorder---- Chronically on Keppra and Lamictal.  --He has been refusing these medications. He has IM Ativan as needed ordered if he has any breakthrough seizures.  6)Noncompliance---- Patient continues refusing medications intermittently.  DVT prophylaxis:heparin injection 5,000  Code Status:full code Family Communication:No family present Disposition Plan:Status is: Inpatient  Remains inpatient appropriate  because:Unsafe d/c plan  Dispo: The patient is from: SNF Anticipated d/c is to: Inpatient psychiatry Anticipated d/c date is: when ever there is a bed at psych facility.  Patient currently is medically stable to d/c. --Inpatient psychiatric admission to a geropsychiatricfacility continues  to be recommended -Patient is medically stable for discharge to inpatient psychiatric/behavioral unit when bed becomes available. Patient continued to be first priority on waiting list at [facility].    Consultants:  Psychiatry  Procedures:  See below for x-ray reports.  Antimicrobials:   None currently (has completed antibiotics for UTI).  Subjective: Patient seen and evaluated. He is angry and will not speak to me and avoids eye contact and refusing medication.  He is verbally abusive to staff especially male staff.     Objective: Vitals:   08/29/20 0116 08/29/20 1800 08/30/20 0551 08/30/20 1500  BP: (!) 155/86 (!) 144/90 (!) 164/67 133/85  Pulse: 96 80 77 84  Resp: 20 18 19 18   Temp: 98.5 F (36.9 C) 97.8 F (36.6 C) 98 F (36.7 C) 98 F (36.7 C)  TempSrc: Oral Oral Oral Oral  SpO2: 100% 100% 97% 99%  Weight: 98.8 kg     Height:        Intake/Output Summary (Last 24 hours) at 08/31/2020 1713 Last data filed at 08/31/2020 2094 Gross per 24 hour  Intake 960 ml  Output 240 ml  Net 720 ml   Filed Weights   07/04/20 0200 08/11/20 2312 08/29/20 0116  Weight: 97.3 kg 99 kg 98.8 kg   Examination:  General exam: awake, sitting up in chair, not cooperative.   Respiratory system:  Respiratory effort normal. Cardiovascular system: normal S1 & S2 heard.  Gastrointestinal system: Abdomen is nondistended, soft and nontender.  Central nervous system: nonfocal exam.  Extremities: No edema Skin: No rashes, lesions or ulcers Psychiatry: angry and agitated, verbally abusive  Data Reviewed: I have personally reviewed following labs and imaging  studies  CBC: No results for input(s): WBC, NEUTROABS, HGB, HCT, MCV, PLT in the last 168 hours. Basic Metabolic Panel: No results for input(s): NA, K, CL, CO2, GLUCOSE, BUN, CREATININE, CALCIUM, MG, PHOS in the last 168 hours. GFR: Estimated Creatinine Clearance: 92.6 mL/min (by C-G formula based on SCr of 0.77 mg/dL). Liver Function Tests: No results for input(s): AST, ALT, ALKPHOS, BILITOT, PROT, ALBUMIN in the last 168 hours. No results for input(s): LIPASE, AMYLASE in the last 168 hours. No results for input(s): AMMONIA in the last 168 hours. Coagulation Profile: No results for input(s): INR, PROTIME in the last 168 hours. Cardiac Enzymes: No results for input(s): CKTOTAL, CKMB, CKMBINDEX, TROPONINI in the last 168 hours. BNP (last 3 results) No results for input(s): PROBNP in the last 8760 hours. HbA1C: No results for input(s): HGBA1C in the last 72 hours. CBG: No results for input(s): GLUCAP in the last 168 hours. Lipid Profile: No results for input(s): CHOL, HDL, LDLCALC, TRIG, CHOLHDL, LDLDIRECT in the last 72 hours. Thyroid Function Tests: No results for input(s): TSH, T4TOTAL, FREET4, T3FREE, THYROIDAB in the last 72 hours. Anemia Panel: No results for input(s): VITAMINB12, FOLATE, FERRITIN, TIBC, IRON, RETICCTPCT in the last 72 hours. Sepsis Labs: No results for input(s): PROCALCITON, LATICACIDVEN in the last 168 hours.  No results found for this or any previous visit (from the past 240 hour(s)).  Radiology Studies: No results found. Scheduled Meds: . amantadine  100 mg Oral BID  . amLODipine  5 mg Oral Daily  . carbamazepine  100 mg Oral QPC breakfast  . carbamazepine  200 mg Oral BID  . cloNIDine  0.2 mg Transdermal Weekly  . FLUoxetine  40 mg Oral Daily  . folic acid  1 mg Oral Daily  . haloperidol  2 mg Oral TID  .  heparin  5,000 Units Subcutaneous Q8H  . levETIRAcetam  500 mg Oral BID  . memantine  10 mg Oral QHS  . nicotine  7 mg Transdermal Daily  .  [START ON 09/08/2020] paliperidone  234 mg Intramuscular Q28 days  . vitamin B-12  500 mcg Oral Daily     LOS: 58 days   Time spent: 21 minutes  Sircharles Holzheimer Wynetta Emery, MD How to contact the [facility] Attending or Consulting provider [place] or covering provider during after hours [place], for this patient?  1. Check the care team in [facility] and look for a) attending/consulting TRH provider listed and b) the [facility] team listed 2. Log into [email or web address] and use [place]'s universal password to access. If you do not have the password, please contact the hospital operator. 3. Locate the [facility] provider you are looking for under [facility] and page to a number that you can be directly reached. 4. If you still have difficulty reaching the provider, please page the [facility] (Director on Call) for the Hospitalists listed on amion for assistance.   If 7PM-7AM, please contact night-coverage [email or web address] 08/31/2020, 5:13 PM
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
## 2020-08-31 NOTE — TOC Progression Note (Signed)
Transition of Care [facility]) - Progression Note   Patient Details  Name: Henry Smith MRN: [id number] Date of Birth: 14-Dec-1941  Transition of Care [facility]) CM/SW [place], LCSW Phone Number: 08/31/2020, 9:40 AM  Clinical Narrative: Updated progress notes regarding patient's behavior were faxed to [place] bed availability. TOC to follow.  Expected Discharge Plan: Psychiatric Hospital Barriers to Discharge: Psych Bed not available  Expected Discharge Plan and Services Expected Discharge Plan: Psychiatric Hospital In-house Referral: Clinical Social Work Living arrangements for the past 2 months: [place] Expected Discharge Date: 07/10/20                Readmission Risk Interventions Readmission Risk Prevention Plan 07/10/2020  Transportation Screening Complete  HRI or Home Care Consult Complete  Social Work Consult for [place] Planning/Counseling Complete  Palliative Care Screening Not Applicable  Medication Review Press photographer) Complete  Some recent data might be hidden
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
## 2020-08-31 NOTE — BH Assessment (Signed)
Pt reassessed 9/18 1030.  Pt was watching TV and did not respond to any reassessment questions.  Pt continues to meet inpatient criteria and updated notes have been sent to [facility]. Awaiting bed availability.   Terrill Wauters, LCSW Therapist/Triage
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
## 2020-09-01 MED ORDER — ZIPRASIDONE MESYLATE 20 MG IM SOLR
10.0000 mg | Freq: Once | INTRAMUSCULAR | Status: AC
  Administered 2020-09-01: 10 mg via INTRAMUSCULAR
  Filled 2020-09-01 (×2): qty 20

## 2020-09-01 NOTE — Progress Notes (Signed)
Patient placed the chair alarm in the sink and cut the water on.  The patient then grabbed a stool hid behind the door and called the sitter to come cut the water off.  Sitter called security.  Security arrived in the room and patient was still holding the stool.  Patient was asked several times to put the stool down but he would not comply.  Security entered the room and patient swung the stool at them.  Security took stool away from the patient.  The stool chairs and other equipment removed from the room.  Patient currently sitting on his bed with sitter and security outside the room.
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
## 2020-09-01 NOTE — BH Assessment (Addendum)
Per Waylan Boga, DBP, patient continues to meet inpatient criteria and remains priority status for [facility] wait list, confirmed with Ron @ 1326.   [facility] states that patient is not on the wait list for the second time. Re faxed documentation. Patient under review to be re-accepted to the wait list.

## 2020-09-01 NOTE — Progress Notes (Signed)
PROGRESS NOTE    Henry Smith  ACZ:[id number] DOB: 1940/12/17 DOA: 07/03/2020 PCP: Caprice Renshaw, MD   Brief Narrative:  79 year old male with a history of neurosyphilis, dementia, schizoaffective disorder, COPD, thrombocytopenia, seizure disorder, who is a long-term resident of a nursing facility, was brought to the hospital with progressive changes in mental status. Patient had been coming increasingly aggressive, was refusing any medications, becoming paranoid. More recently, staff had noted that he was drinking his urine. He was evaluated in the emergency room and felt that he may have a urinary tract infection was admitted to the hospital service. Further review of urine studies indicate that his positive urine culture is likely a colonization and not a true infection. He did not have any other signs of active infection. Discussed with infectious disease who agreed with not treating with antibiotics. It was felt that his progressive decline in mental status was related to his underlying behavioral health. Seen by psychiatry with recommendations for inpatient psychiatry. He is involuntarily committed. Patient is medically stable for discharge whenever he can be placed by psychiatry.  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 He has been refusing his medications for months.He is on several psychotropic medications such as Depakote, Haldol, Ativan. He also takes Lamictal and Keppra for seizures. His behavior changes appear to be chronic progressive in  nature and appear to be secondary to his underlying mental illness. Other work-up including CT imaging of head, basic chemistry, ammonia, TSH, ABG were unrevealing. Appreciate psychiatry input. Recommendations are for inpatient psychiatry. He is currently under involuntary commitment.---- -Inpatient psychiatric admission to a geropsychiatricfacility continues to be recommended - Patient is medically stable for discharge to inpatient psychiatric/behavioral unit when bed becomes available  --- Poor compliance with medications and persistent psychotic type behavior disturbance persist -Continue to follow psychiatry service for medications adjustment and further recommendations -Continue treatment as per Psychiatry recommendations.  After further eval by psych provider-- changes to pt's regimen----  --Depakote was dc'd b/c it is difficult to monitor levels (pt is not always cooperative with lab draws)--- --Scheduled--c/n Amantadine, Namenda, Haldol, Prozac , Clonidine patch and Tegretol  Prn-- Im Haldol and im Ativan ordered  2)HTN--BP isnow at goal -will continue oral amlodipine and clonidine patch  3)Dementia Associated with Neurosyphilis---Review of records indicate that he was treated for neurosyphilis in 2018.  -He is chronically on Namenda which he has been refusing intermittently.  4)Thrombocytopenia/pancytopenia--patient with some degree of mild pancytopenia which is stable-- Appears to be a chronic issue. -No signs of overt bleeding.  5)Seizure disorder---- Chronically on Keppra and Lamictal.  --He has been refusing these medications. He has IM Ativan as needed ordered if he has any breakthrough seizures.  6)Noncompliance----Patient continues refusing medications intermittently.  DVT prophylaxis:heparin injection 5,000  Code Status:full code Family Communication:No family present Disposition Plan:Status is: Inpatient  Remains inpatient appropriate  because:Unsafe d/c plan  Dispo: The patient is from: SNF Anticipated d/c is to: Inpatient psychiatry Anticipated d/c date is: when ever there is a bed at psych facility.  Patient currently is medically stable to d/c. --Inpatient psychiatric admission to a geropsychiatricfacility continues to  be recommended -Patient is medically stable for discharge to inpatient psychiatric/behavioral unit when bed becomes available. Patient continued to be first priority on waiting list at [facility].   Consultants:  Psychiatry  Procedures:  See below for x-ray reports.  Antimicrobials:   None currently (has completed antibiotics for UTI).  Subjective: Patient was seen needing help to get from bathroom to bed, I assist nurse tech to get him to bed.  He appears weak and needed 2 people to get safely to bed.      Objective: Vitals:   08/29/20 1800 08/30/20 0551 08/30/20 1500 08/31/20 2027  BP: (!) 144/90 (!) 164/67 133/85 (!) 191/89  Pulse: 80 77 84 79  Resp: 18 19 18 20   Temp: 97.8 F (36.6 C) 98 F (36.7 C) 98 F (36.7 C) 97.7 F (36.5 C)  TempSrc: Oral Oral Oral Oral  SpO2: 100% 97% 99% 100%  Weight:      Height:        Intake/Output Summary (Last 24 hours) at 09/01/2020 1059 Last data filed at 08/31/2020 1839 Gross per 24 hour  Intake 240 ml  Output --  Net 240 ml   Filed Weights   07/04/20 0200 08/11/20 2312 08/29/20 0116  Weight: 97.3 kg 99 kg 98.8 kg   Examination:  General exam: awake, not cooperative.   Respiratory system:  Respiratory effort normal. Cardiovascular system: normal S1 & S2 heard.  Gastrointestinal system: Abdomen is nondistended, soft and nontender.  Central nervous system: nonfocal exam.  Extremities: No edema Skin: No rashes, lesions or ulcers Psychiatry: flat affect.   Data Reviewed: I have personally reviewed following labs and imaging studies  CBC: No results for input(s): WBC, NEUTROABS, HGB, HCT,  MCV, PLT in the last 168 hours. Basic Metabolic Panel: No results for input(s): NA, K, CL, CO2, GLUCOSE, BUN, CREATININE, CALCIUM, MG, PHOS in the last 168 hours. GFR: Estimated Creatinine Clearance: 92.6 mL/min (by C-G formula based on SCr of 0.77 mg/dL). Liver Function Tests: No results for input(s): AST, ALT, ALKPHOS, BILITOT, PROT, ALBUMIN in the last 168 hours. No results for input(s): LIPASE, AMYLASE in the last 168 hours. No results for input(s): AMMONIA in the last 168 hours. Coagulation Profile: No results for input(s): INR, PROTIME in the last 168 hours. Cardiac Enzymes: No results for input(s): CKTOTAL, CKMB, CKMBINDEX, TROPONINI in the last 168 hours. BNP (last 3 results) No results for input(s): PROBNP in the last 8760 hours. HbA1C: No results for input(s): HGBA1C in the last 72 hours. CBG: No results for input(s): GLUCAP in the last 168 hours. Lipid Profile: No results for input(s): CHOL, HDL, LDLCALC, TRIG, CHOLHDL, LDLDIRECT in the last 72 hours. Thyroid Function Tests: No results for input(s): TSH, T4TOTAL, FREET4, T3FREE, THYROIDAB in the last 72 hours. Anemia Panel: No results for input(s): VITAMINB12, FOLATE, FERRITIN, TIBC, IRON, RETICCTPCT in the last 72 hours. Sepsis Labs: No results for input(s): PROCALCITON, LATICACIDVEN in the last 168 hours.  No results found for this or any previous visit (from the past 240 hour(s)).  Radiology Studies: No results found. Scheduled Meds: . amantadine  100 mg Oral BID  . amLODipine  5 mg Oral Daily  . carbamazepine  100 mg Oral QPC breakfast  . carbamazepine  200 mg Oral BID  . cloNIDine  0.2 mg Transdermal Weekly  . FLUoxetine  40 mg Oral Daily  . folic acid  1 mg Oral Daily  . haloperidol  2 mg Oral TID  . heparin  5,000 Units Subcutaneous  Q8H  . levETIRAcetam  500 mg Oral BID  . memantine  10 mg Oral QHS  . nicotine  7 mg Transdermal Daily  . [START ON 09/08/2020] paliperidone  234 mg Intramuscular Q28 days  .  vitamin B-12  500 mcg Oral Daily     LOS: 59 days   Time spent: 30 minutes  Cissy Galbreath Wynetta Emery, MD How to contact the [facility] Attending or Consulting provider [place] or covering provider during after hours [place], for this patient?  1. Check the care team in [facility] and look for a) attending/consulting TRH provider listed and b) the [facility] team listed 2. Log into [email or web address] and use [place]'s universal password to access. If you do not have the password, please contact the hospital operator. 3. Locate the [facility] provider you are looking for under [facility] and page to a number that you can be directly reached. 4. If you still have difficulty reaching the provider, please page the [facility] (Director on Call) for the Hospitalists listed on amion for assistance.   If 7PM-7AM, please contact night-coverage [email or web address] 09/01/2020, 10:59 AM

## 2020-09-01 NOTE — Progress Notes (Addendum)
Patient came out into the hall way yelling obscenities at the sitter and cornered her in a threatening way.  Security called and patient returned to room.  Patient given Geodon.  Patient is currently in room resting. Will continue to monitor.

## 2020-09-01 NOTE — Progress Notes (Signed)
Security at bedside, PRN IM haldol and ativan given. Pt agreeable to medication administration this am with security and NT in room.

## 2020-09-01 NOTE — Progress Notes (Signed)
Patient came out into the hall and was yelling and approaching the sitter.  Security called.  Patient escorted back into room.  Currently sitting on the side of the bed.  Will continue to monitor.

## 2020-09-02 NOTE — TOC Transition Note (Signed)
Transition of Care [facility]) - CM/SW Discharge Note   Patient Details  Name: Henry Smith MRN: [id number] Date of Birth: 10/21/1941  Transition of Care [facility]) CM/SW Contact:  Natasha Bence, LCSW Phone Number: 09/02/2020, 4:59 PM   Clinical Narrative:    CSW faxed IVC paperwork to magistrate and confirmed they have received it. CSW faxed updated behavioral documentation to [facility]. TOC to follow.      Barriers to Discharge: Psych Bed not available   Patient Goals and CMS Choice        Discharge Placement                       Discharge Plan and Services In-house Referral: Clinical Social Work                                   Social Determinants of Health (SDOH) Interventions     Readmission Risk Interventions Readmission Risk Prevention Plan 07/10/2020  Transportation Screening Complete  HRI or [place] Complete  Social Work Consult for [place] Planning/Counseling Complete  Palliative Care Screening Not Applicable  Medication Review [employer]) Complete  Some recent data might be hidden

## 2020-09-02 NOTE — Progress Notes (Signed)
PROGRESS NOTE    Henry Smith  WFU:[id number] DOB: 03-13-1941 DOA: 07/03/2020 PCP: Caprice Renshaw, MD   Brief Narrative:  79 year old male with a history of neurosyphilis, dementia, schizoaffective disorder, COPD, thrombocytopenia, seizure disorder, who is a long-term resident of a nursing facility, was brought to the hospital with progressive changes in mental status. Patient had been coming increasingly aggressive, was refusing any medications, becoming paranoid. More recently, staff had noted that he was drinking his urine. He was evaluated in the emergency room and felt that he may have a urinary tract infection was admitted to the hospital service. Further review of urine studies indicate that his positive urine culture is likely a colonization and not a true infection. He did not have any other signs of active infection. Discussed with infectious disease who agreed with not treating with antibiotics. It was felt that his progressive decline in mental status was related to his underlying behavioral health. Seen by psychiatry with recommendations for inpatient psychiatry. He is involuntarily committed. Patient is medically stable for discharge whenever he can be placed by psychiatry.  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 He has been refusing his medications for months.He is on several psychotropic medications such as Depakote, Haldol, Ativan. He also takes Lamictal and Keppra for seizures. His behavior changes appear to be chronic progressive in  nature and appear to be secondary to his underlying mental illness. Other work-up including CT imaging of head, basic chemistry, ammonia, TSH, ABG were unrevealing. Appreciate psychiatry input. Recommendations are for inpatient psychiatry. He is currently under involuntary commitment. -Inpatient psychiatric admission to a geropsychiatricfacility continues to be recommended - Patient is medically stable for discharge to inpatient psychiatric/behavioral unit when bed becomes available  --- Poor compliance with medications and persistent psychotic type behavior disturbance persist -Continue to follow psychiatry service for medications adjustment and further recommendations -Continue treatment as per Psychiatry recommendations.  After further eval by psych provider-- changes to pt's regimen----  --Depakote was dc'd b/c it is difficult to monitor levels (pt is not always cooperative with lab draws)--- --Scheduled--c/n Amantadine, Namenda, Haldol, Prozac , Clonidine patch and Tegretol  Prn-- Im Haldol and im Ativan ordered  2)HTN--BP isnow at goal -will continue oral amlodipine and clonidine patch  3)Dementia Associated with Neurosyphilis---Review of records indicate that he was treated for neurosyphilis in 2018.  -He is chronically on Namenda which he has been refusing intermittently.  4)Thrombocytopenia/pancytopenia--patient with some degree of mild pancytopenia which is stable-- Appears to be a chronic issue. -No signs of overt bleeding.  5)Seizure disorder---- Chronically on Keppra and Lamictal.  --He has been refusing these medications. He has IM Ativan as needed ordered if he has any breakthrough seizures.  6)Noncompliance----Patient continues refusing medications intermittently.  DVT prophylaxis:heparin injection 5,000  Code Status:full code Family Communication:No family present Disposition Plan:Status is: Inpatient  Remains inpatient appropriate  because:Unsafe d/c plan  Dispo: The patient is from: SNF Anticipated d/c is to: Inpatient psychiatry Anticipated d/c date is: when ever there is a bed at psych facility.  Patient currently is medically stable to d/c. --Inpatient psychiatric admission to a geropsychiatricfacility continues to  be recommended -Patient is medically stable for discharge to inpatient psychiatric/behavioral unit when bed becomes available. Patient continued to be first priority on waiting list at [facility].   Consultants:  Psychiatry  Procedures:  See below for x-ray reports.  Antimicrobials:   None currently (has completed antibiotics for UTI).  Subjective: Patient is becoming more and more agitated over time as he has to wait in hospital for [facility] bed.  He continues threatening and inappropriate behaviors to male staff.  He continues to intermittently refuse medications.     Objective: Vitals:   08/29/20 1800 08/30/20 0551 08/30/20 1500 08/31/20 2027  BP: (!) 144/90 (!) 164/67 133/85 (!) 191/89  Pulse: 80 77 84 79  Resp: 18 19 18 20   Temp: 97.8 F (36.6 C) 98 F (36.7 C) 98 F (36.7 C) 97.7 F (36.5 C)  TempSrc: Oral Oral Oral Oral  SpO2: 100% 97% 99% 100%  Weight:      Height:        Intake/Output Summary (Last 24 hours) at 09/02/2020 1313 Last data filed at 09/02/2020 0900 Gross per 24 hour  Intake 2040 ml  Output --  Net 2040 ml   Filed Weights   07/04/20 0200 08/11/20 2312 08/29/20 0116  Weight: 97.3 kg 99 kg 98.8 kg   Examination:  General exam: awake, not cooperative.   Respiratory system:  Respiratory effort normal. Cardiovascular system: normal S1 & S2 heard.  Gastrointestinal system: Abdomen is nondistended, soft and nontender.  Central nervous system: nonfocal exam.  Extremities: No edema Skin: No rashes, lesions or ulcers Psychiatry: flat affect.   Data Reviewed: I have personally reviewed following labs and imaging  studies  CBC: No results for input(s): WBC, NEUTROABS, HGB, HCT, MCV, PLT in the last 168 hours. Basic Metabolic Panel: No results for input(s): NA, K, CL, CO2, GLUCOSE, BUN, CREATININE, CALCIUM, MG, PHOS in the last 168 hours. GFR: Estimated Creatinine Clearance: 92.6 mL/min (by C-G formula based on SCr of 0.77 mg/dL). Liver Function Tests: No results for input(s): AST, ALT, ALKPHOS, BILITOT, PROT, ALBUMIN in the last 168 hours. No results for input(s): LIPASE, AMYLASE in the last 168 hours. No results for input(s): AMMONIA in the last 168 hours. Coagulation Profile: No results for input(s): INR, PROTIME in the last 168 hours. Cardiac Enzymes: No results for input(s): CKTOTAL, CKMB, CKMBINDEX, TROPONINI in the last 168 hours. BNP (last 3 results) No results for input(s): PROBNP in the last 8760 hours. HbA1C: No results for input(s): HGBA1C in the last 72 hours. CBG: No results for input(s): GLUCAP in the last 168 hours. Lipid Profile: No results for input(s): CHOL, HDL, LDLCALC, TRIG, CHOLHDL, LDLDIRECT in the last 72 hours. Thyroid Function Tests: No results for input(s): TSH, T4TOTAL, FREET4, T3FREE, THYROIDAB in the last 72 hours. Anemia Panel: No results for input(s): VITAMINB12, FOLATE, FERRITIN, TIBC, IRON, RETICCTPCT in the last 72 hours. Sepsis Labs: No results for input(s): PROCALCITON, LATICACIDVEN in the last 168 hours.  No results found for this or any previous visit (from the past 240 hour(s)).  Radiology Studies: No results found. Scheduled Meds: . amantadine  100 mg Oral BID  . amLODipine  5 mg Oral Daily  . carbamazepine  100 mg Oral QPC breakfast  . carbamazepine  200 mg Oral BID  . cloNIDine  0.2 mg Transdermal Weekly  . FLUoxetine  40 mg Oral Daily  . folic acid  1 mg Oral Daily  . haloperidol  2 mg Oral TID  . heparin  5,000  Units Subcutaneous Q8H  . levETIRAcetam  500 mg Oral BID  . memantine  10 mg Oral QHS  . nicotine  7 mg Transdermal Daily  .  [START ON 09/08/2020] paliperidone  234 mg Intramuscular Q28 days  . vitamin B-12  500 mcg Oral Daily     LOS: 60 days   Time spent: 30 minutes  Azelea Seguin Wynetta Emery, MD How to contact the [facility] Attending or Consulting provider [place] or covering provider during after hours [place], for this patient?  1. Check the care team in [facility] and look for a) attending/consulting TRH provider listed and b) the [facility] team listed 2. Log into [email or web address] and use [place]'s universal password to access. If you do not have the password, please contact the hospital operator. 3. Locate the [facility] provider you are looking for under [facility] and page to a number that you can be directly reached. 4. If you still have difficulty reaching the provider, please page the [facility] (Director on Call) for the Hospitalists listed on amion for assistance.   If 7PM-7AM, please contact night-coverage [email or web address] 09/02/2020, 1:13 PM

## 2020-09-02 NOTE — Progress Notes (Signed)
Patient is seen wandering in the hallways, walking towards the exit doors. Nurse and tech attempting to redirect patient back to his room. Pt stated, "I don't want to hear it!!" Pt had to be redirected back to his room by security.

## 2020-09-03 MED ORDER — OXYCODONE HCL 5 MG PO TABS
10.0000 mg | ORAL_TABLET | Freq: Once | ORAL | Status: AC
  Administered 2020-09-03: 10 mg via ORAL
  Filled 2020-09-03: qty 2

## 2020-09-03 NOTE — Progress Notes (Signed)
PROGRESS NOTE    Henry Smith  UGQ:[id number] DOB: Jan 04, 1941 DOA: 07/03/2020 PCP: Caprice Renshaw, MD   Brief Narrative:  79 year old male with a history of neurosyphilis, dementia, schizoaffective disorder, COPD, thrombocytopenia, seizure disorder, who is a long-term resident of a nursing facility, was brought to the hospital with progressive changes in mental status. Patient had been coming increasingly aggressive, was refusing any medications, becoming paranoid. More recently, staff had noted that he was drinking his urine. He was evaluated in the emergency room and felt that he may have a urinary tract infection was admitted to the hospital service. Further review of urine studies indicate that his positive urine culture is likely a colonization and not a true infection. He did not have any other signs of active infection. Discussed with infectious disease who agreed with not treating with antibiotics. It was felt that his progressive decline in mental status was related to his underlying behavioral health. Seen by psychiatry with recommendations for inpatient psychiatry. He is involuntarily committed. Patient is medically stable for discharge whenever he can be placed by psychiatry.  -Patient is independent with ADLs, ambulatory  Assessment & Plan:   Principal Problem:   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[place]) Active Problems:   Thrombocytopenia (H[facility])   Schizoaffective disorder, bipolar type ([place])   Seizure ([place])   Noncompliance with medication regimen   Acute metabolic encephalopathy  1)Progressive Behavioral Changes--- Initially felt to be related to possible UTI, but patient likely has colonization and not a true urinary tract infection. He has been refusing his medications for months.He is on several psychotropic medications such as Depakote, Haldol, Ativan. He also takes Lamictal and Keppra for seizures. His behavior changes appear to be chronic progressive in  nature and appear to be secondary to his underlying mental illness. Other work-up including CT imaging of head, basic chemistry, ammonia, TSH, ABG were unrevealing. Appreciate psychiatry input. Recommendations are for inpatient psychiatry. He is currently under involuntary commitment. -Inpatient psychiatric admission to a geropsychiatricfacility continues to be recommended - Patient is medically stable for discharge to inpatient psychiatric/behavioral unit when bed becomes available  --- Poor compliance with medications and persistent psychotic type behavior disturbance persist -Continue to follow psychiatry service for medications adjustment and further recommendations -Continue treatment as per Psychiatry recommendations.  After further eval by psych provider-- changes to pt's regimen----  --Depakote was dc'd b/c it is difficult to monitor levels (pt is not always cooperative with lab draws)--- --Scheduled--c/n Amantadine, Namenda, Haldol, Prozac , Clonidine patch and Tegretol  Prn-- Im Haldol and im Ativan ordered  2)HTN--BP isnow at goal -will continue oral amlodipine and clonidine patch  3)Dementia Associated with Neurosyphilis---Review of records indicate that he was treated for neurosyphilis in 2018.  -He is chronically on Namenda which he has been refusing intermittently.  4)Thrombocytopenia/pancytopenia--patient with some degree of mild pancytopenia which is stable-- Appears to be a chronic issue. -No signs of overt bleeding.  5)Seizure disorder---- Chronically on Keppra and Lamictal.  --He has been refusing these medications. He has IM Ativan as needed ordered if he has any breakthrough seizures.  6)Noncompliance----Patient continues refusing medications intermittently.  DVT prophylaxis:heparin injection 5,000  Code Status:full code Family Communication:No family present Disposition Plan:Status is: Inpatient  Remains inpatient appropriate  because:Unsafe d/c plan  Dispo: The patient is from: SNF Anticipated d/c is to: Inpatient psychiatry Anticipated d/c date is: when ever there is a bed at psych facility.  Patient currently is medically stable to d/c. --Inpatient psychiatric admission to a geropsychiatricfacility continues to  be recommended -Patient is medically stable for discharge to inpatient psychiatric/behavioral unit when bed becomes available. Patient continued to be first priority on waiting list at [facility].   Consultants:  Psychiatry  Procedures:  See below for x-ray reports.  Antimicrobials:   None currently (has completed antibiotics for UTI).  Subjective: Patient is becoming more and more agitated over time as he has to wait in hospital for [facility] bed.  He continues threatening and inappropriate behaviors to male staff.  He continues to intermittently refuse medications.  He is resting comfortably today and has no specific complaints other than wanting to leave the hospital.    Objective: Vitals:   08/30/20 0551 08/30/20 1500 08/31/20 2027 09/03/20 0423  BP: (!) 164/67 133/85 (!) 191/89 (!) 141/96  Pulse: 77 84 79 85  Resp: 19 18 20 18   Temp: 98 F (36.7 C) 98 F (36.7 C) 97.7 F (36.5 C)   TempSrc: Oral Oral Oral   SpO2: 97% 99% 100% 100%  Weight:      Height:        Intake/Output Summary (Last 24 hours) at 09/03/2020 1253 Last data filed at 09/03/2020 1200 Gross per 24 hour  Intake 840 ml  Output --  Net 840 ml   Filed Weights   07/04/20 0200 08/11/20 2312 08/29/20 0116  Weight: 97.3 kg 99 kg 98.8 kg   Examination:  General exam: awake, not cooperative.   Respiratory system:  Respiratory effort normal. Cardiovascular system: normal S1 & S2 heard.  Gastrointestinal system: Abdomen is nondistended, soft and nontender.  Central nervous system: nonfocal exam.  Extremities: No edema Skin: No rashes, lesions or ulcers Psychiatry: flat affect.    Data Reviewed: I have personally reviewed following labs and imaging studies  CBC: No results for input(s): WBC, NEUTROABS, HGB, HCT, MCV, PLT in the last 168 hours. Basic Metabolic Panel: No results for input(s): NA, K, CL, CO2, GLUCOSE, BUN, CREATININE, CALCIUM, MG, PHOS in the last 168 hours. GFR: CrCl cannot be calculated (Patient's most recent lab result is older than the maximum 21 days allowed.). Liver Function Tests: No results for input(s): AST, ALT, ALKPHOS, BILITOT, PROT, ALBUMIN in the last 168 hours. No results for input(s): LIPASE, AMYLASE in the last 168 hours. No results for input(s): AMMONIA in the last 168 hours. Coagulation Profile: No results for input(s): INR, PROTIME in the last 168 hours. Cardiac Enzymes: No results for input(s): CKTOTAL, CKMB, CKMBINDEX, TROPONINI in the last 168 hours. BNP (last 3 results) No results for input(s): PROBNP in the last 8760 hours. HbA1C: No results for input(s): HGBA1C in the last 72 hours. CBG: No results for input(s): GLUCAP in the last 168 hours. Lipid Profile: No results for input(s): CHOL, HDL, LDLCALC, TRIG, CHOLHDL, LDLDIRECT in the last 72 hours. Thyroid Function Tests: No results for input(s): TSH, T4TOTAL, FREET4, T3FREE, THYROIDAB in the last 72 hours. Anemia Panel: No results for input(s): VITAMINB12, FOLATE, FERRITIN, TIBC, IRON, RETICCTPCT in the last 72 hours. Sepsis Labs: No results for input(s): PROCALCITON, LATICACIDVEN in the last 168 hours.  No results found for this or any previous visit (from the past 240 hour(s)).  Radiology Studies: No results found. Scheduled Meds: . amantadine  100 mg Oral BID  . amLODipine  5 mg Oral Daily  . carbamazepine  100 mg Oral QPC breakfast  . carbamazepine  200 mg Oral BID  . cloNIDine  0.2 mg Transdermal Weekly  . FLUoxetine  40 mg Oral Daily  . folic acid  1 mg Oral Daily  . haloperidol  2 mg Oral TID  . heparin  5,000 Units Subcutaneous Q8H  . levETIRAcetam   500 mg Oral BID  . memantine  10 mg Oral QHS  . nicotine  7 mg Transdermal Daily  . [START ON 09/08/2020] paliperidone  234 mg Intramuscular Q28 days  . vitamin B-12  500 mcg Oral Daily     LOS: 61 days   Time spent: 25 minutes  Irwin Brakeman, MD How to contact the [facility] Attending or Consulting provider [place] or covering provider during after hours [place], for this patient?  1. Check the care team in [facility] and look for a) attending/consulting TRH provider listed and b) the [facility] team listed 2. Log into [email or web address] and use [place]'s universal password to access. If you do not have the password, please contact the hospital operator. 3. Locate the [facility] provider you are looking for under [facility] and page to a number that you can be directly reached. 4. If you still have difficulty reaching the provider, please page the [facility] (Director on Call) for the Hospitalists listed on amion for assistance.   If 7PM-7AM, please contact night-coverage [email or web address] 09/03/2020, 12:53 PM

## 2020-09-03 NOTE — Progress Notes (Addendum)
CSW received phone call from [facility] in admissions at [facility]. She states that pt is "scheduled" to be accepted on Tuesday 09/10/20. She requests his IVC paperwork, a current MAR, provider notes from the past five days and most recent labs. CSW will fax requested documentation to [phone]  Audree Camel, MSW, [place], [place] Worker II Disposition CSW [phone]

## 2020-09-04 NOTE — Progress Notes (Signed)
PROGRESS NOTE  Henry Smith XIP:[id number] DOB: 07-Sep-1941 DOA: 07/03/2020 PCP: Caprice Renshaw, MD  Brief History:  79 year old male with a history of neurosyphilis, dementia, schizoaffective disorder, COPD, thrombocytopenia, seizure disorder, who is a long-term resident of a nursing facility, was brought to the hospital with progressive changes in mental status. Patient had been coming increasingly aggressive, was refusing any medications, becoming paranoid. More recently, staff had noted that he was drinking his urine. He was evaluated in the emergency room and felt that he may have a urinary tract infection was admitted to the hospital service. Further review of urine studies indicate that his positive urine culture is likely a colonization and not a true infection. He did not have any other signs of active infection. Discussed with infectious disease who agreed with not treating with antibiotics. It was felt that his progressive decline in mental status was related to his underlying behavioral health. Seen by psychiatry with recommendations for inpatient psychiatry. He is involuntarily committed. Patient is medically stable for discharge whenever he can be placed by psychiatry.  -Patient is independent with ADLs, ambulatory -patient continues to have intermittent episodes of aggressive behavior requiring security and constant re-direction  Assessment/Plan: 1)Progressive Behavioral Changes--- Initially felt to be related to possible UTI, but patient likely has colonization and not a true urinary tract infection. He has been refusing his medications for weeks to months.He is on several psychotropic medications such as Depakote, Haldol, Ativan. He also takes Lamictal and Keppra for seizures. His behavior changes appear to be chronic progressive in nature and appear to be secondary to his underlying mental illness. Other work-up including CT imaging of head, basic chemistry,  ammonia, TSH, ABG were unrevealing. Appreciate psychiatry input. Recommendations are for inpatient psychiatry. He is currently under involuntary commitment.---- -Inpatient psychiatric admission to a geropsychiatricfacility continues to be recommended -Patient is medically stable for discharge to inpatient psychiatric/behavioral unit when bed becomes available  --- Poor compliance with medications and persistent psychotic type behavior disturbance persist --IVC paperwork resubmitted; will further resubmitevery 7 days -Continue to follow psychiatry service for medications adjustment and further recommendations-   After further eval by psych provider-- changes to pt's regimen----  --Depakote was dced bcos it is difficult to monitor levels (pt is not always cooperative with lab draws)--- --Scheduled--c/n Amantadine, Namenda, Haldol, Prozac , Clonidine patch and Tegretol  Prn-- Im Haldol and im Ativan orderedfor agitation  2)HTN--BP is not at goal partly due to poor compliance with medications - -continue oral amlodipine and clonidine patch -BP acceptable  3)Dementia Associated with Neurosyphilis--- Review of records indicate that he was treated for neurosyphilis in 2018.  -He is chronically on Namenda which he has been refusing intermittently.  4)Thrombocytopenia/pancytopenia--patient with some degree of mild pancytopenia which is stable-- Appears to be a chronic issue. -No signs of overt bleeding.  5)Seizure disorder---- Chronically on Keppra and Lamictal. -lamictal stopped by psychiatry on 8/27 --He has been refusing these medicationsintermittently. He has IM Ativan as needed ordered if he has any breakthrough seizures.  6)Noncompliance---- Patient continues refusing medications intermittently.       Status is: Inpatient  Remains inpatient appropriate because:Unsafe d/c plan   Dispo:  Patient From:    Planned Disposition: Behavioral Health  Expected  discharge date: 09/10/20  Medically stable for discharge: Yes         Family Communication: no  Family at bedside  Consultants:  psychiatry  Code Status:  FULL   DVT  Prophylaxis:  [place]   Procedures: As Listed in Progress Note Above  Antibiotics: None      Subjective: Patient denies fevers, chills, headache, chest pain, dyspnea, nausea, vomiting, diarrhea, abdominal pain, dysuria, hematuria, hematochezia, and melena.   Objective: Vitals:   08/31/20 2027 09/03/20 0423 09/03/20 1436 09/04/20 1300  BP:  (!) 141/96 125/75 (!) 154/74  Pulse: 79 85 89 86  Resp: 20 18 18 18   Temp:   98 F (36.7 C) 97.8 F (36.6 C)  TempSrc: Oral  Oral Oral  SpO2: 100% 100% 99% 100%  Weight:      Height:        Intake/Output Summary (Last 24 hours) at 09/04/2020 1818 Last data filed at 09/04/2020 1751 Gross per 24 hour  Intake 1540 ml  Output --  Net 1540 ml   Weight change:  Exam:   General:  Pt is alert, follows commands appropriately, not in acute distress  HEENT: No icterus, No thrush, No neck mass, [place]/AT  Cardiovascular: refused  Respiratory: refused  Abdomen: refused  Extremities: refused   Data Reviewed: I have personally reviewed following labs and imaging studies Basic Metabolic Panel: No results for input(s): NA, K, CL, CO2, GLUCOSE, BUN, CREATININE, CALCIUM, MG, PHOS in the last 168 hours. Liver Function Tests: No results for input(s): AST, ALT, ALKPHOS, BILITOT, PROT, ALBUMIN in the last 168 hours. No results for input(s): LIPASE, AMYLASE in the last 168 hours. No results for input(s): AMMONIA in the last 168 hours. Coagulation Profile: No results for input(s): INR, PROTIME in the last 168 hours. CBC: No results for input(s): WBC, NEUTROABS, HGB, HCT, MCV, PLT in the last 168 hours. Cardiac Enzymes: No results for input(s): CKTOTAL, CKMB, CKMBINDEX, TROPONINI in the last 168 hours. BNP: Invalid input(s): POCBNP CBG: No results for input(s):  GLUCAP in the last 168 hours. HbA1C: No results for input(s): HGBA1C in the last 72 hours. Urine analysis:    Component Value Date/Time   COLORURINE YELLOW 07/03/2020 1510   APPEARANCEUR HAZY (A) 07/03/2020 1510   LABSPEC 1.018 07/03/2020 1510   PHURINE 7.0 07/03/2020 1510   GLUCOSEU NEGATIVE 07/03/2020 1510   HGBUR NEGATIVE 07/03/2020 1510   BILIRUBINUR NEGATIVE 07/03/2020 1510   KETONESUR NEGATIVE 07/03/2020 1510   PROTEINUR NEGATIVE 07/03/2020 1510   NITRITE POSITIVE (A) 07/03/2020 1510   LEUKOCYTESUR NEGATIVE 07/03/2020 1510   Sepsis Labs: @LABRCNTIP (procalcitonin:4,lacticidven:4) )No results found for this or any previous visit (from the past 240 hour(s)).   Scheduled Meds: . amantadine  100 mg Oral BID  . amLODipine  5 mg Oral Daily  . carbamazepine  100 mg Oral QPC breakfast  . carbamazepine  200 mg Oral BID  . cloNIDine  0.2 mg Transdermal Weekly  . FLUoxetine  40 mg Oral Daily  . folic acid  1 mg Oral Daily  . haloperidol  2 mg Oral TID  . heparin  5,000 Units Subcutaneous Q8H  . levETIRAcetam  500 mg Oral BID  . memantine  10 mg Oral QHS  . nicotine  7 mg Transdermal Daily  . [START ON 09/08/2020] paliperidone  234 mg Intramuscular Q28 days  . vitamin B-12  500 mcg Oral Daily   Continuous Infusions:  Procedures/Studies: No results found.  Orson Eva, DO  [facility]  If 7PM-7AM, please contact night-coverage [email or web address] Password [email or web address] 09/04/2020, 6:18 PM   LOS: 62 days

## 2020-09-04 NOTE — TOC Progression Note (Addendum)
Transition of Care [facility]) - Progression Note   Patient Details  Name: Lenville Hibberd MRN: [id number] Date of Birth: 06-01-1941  Transition of [facility]) CM/SW [place], LCSW Phone Number: 09/04/2020, 10:49 AM  Clinical Narrative: CSW updated by LCSW with [place], Audree Camel. Per Judson Roch, the requested documentation was faxed to [facility]. Tentative admission will be next Tuesday (09/10/20). CSW faxed over hospitalist notes from past 5 days and additional behavioral notes to [facility]. TOC to follow.  Expected Discharge Plan: Psychiatric Hospital Barriers to Discharge: Psych Bed not available  Expected Discharge Plan and Services Expected Discharge Plan: Psychiatric Hospital In-house Referral: Clinical Social Work Living arrangements for the past 2 months: [place] Expected Discharge Date: 07/10/20                Readmission Risk Interventions Readmission Risk Prevention Plan 07/10/2020  Transportation Screening Complete  HRI or Home Care Consult Complete  Social Work Consult for [place] Planning/Counseling Complete  Palliative Care Screening Not Applicable  Medication Review Press photographer) Complete  Some recent data might be hidden

## 2020-09-04 NOTE — BH Assessment (Signed)
Reassessment 09/04/2020  Pt is in bed with the covers on him. Pt says that he can not hear TTS. Pt nurse is repeating TTS questions but pt did not respond. TTS informs pt that he is scheduled to go to another facility on 09/10/2020, pt did not respond. TTS asks pt if he has any questions and ended the assessment.

## 2020-09-04 NOTE — Progress Notes (Signed)
Patient alert and speaking with staff.  Police officer that was with patient left floor because shift was over.  Patient immediately made his way to the exit door, but was hindered by staff.  Escorted patient back into room and told him he would not be allowed to wander halls because of elopement risk. Explained to patient that he was in the hospital, and it was not standard procedure to let patients elope.  Patient was very irate and angry, and started using profanity and hurling verbal threats to staff about physical violence.  Sitter in place, security came to floor.

## 2020-09-04 NOTE — Progress Notes (Signed)
Pt sitting on side of bed eating breakfast. Pt does not respond to questions asked, is muttering and mumbling to self, glares at staff member when approached.

## 2020-09-04 NOTE — Progress Notes (Signed)
Pt up and ambulated to bathroom. Pt talking to NT sitter and this nurse. Oriented to person only. Pt refuses all meds at this time, states, "I'll take them on the 12th of never!". Pt also refuses to all this nurse to perform physical assessment. Denies any needs.

## 2020-09-04 NOTE — Progress Notes (Signed)
Patient refused vitals x 2.

## 2020-09-05 MED ORDER — HYDROCORTISONE 1 % EX CREA
1.0000 "application " | TOPICAL_CREAM | Freq: Three times a day (TID) | CUTANEOUS | Status: DC | PRN
  Filled 2020-09-05: qty 28

## 2020-09-05 NOTE — TOC Progression Note (Signed)
Transition of Care [facility]) - Progression Note   Patient Details  Name: Henry Smith MRN: [id number] Date of Birth: 09/04/1941  Transition of Care [facility]) CM/SW [place], LCSW Phone Number: 09/05/2020, 11:44 AM  Clinical Narrative: CSW faxed nursing behavioral notes to [facility] [phone]). TOC to follow.  Expected Discharge Plan: Psychiatric Hospital Barriers to Discharge: Psych Bed not available  Expected Discharge Plan and Services Expected Discharge Plan: Psychiatric Hospital In-house Referral: Clinical Social Work Living arrangements for the past 2 months: [place] Expected Discharge Date: 07/10/20                Readmission Risk Interventions Readmission Risk Prevention Plan 07/10/2020  Transportation Screening Complete  HRI or Home Care Consult Complete  Social Work Consult for [place] Planning/Counseling Complete  Palliative Care Screening Not Applicable  Medication Review Press photographer) Complete  Some recent data might be hidden

## 2020-09-05 NOTE — Progress Notes (Signed)
PROGRESS NOTE  Henry Smith UMP:[id number] DOB: [birth year] DOA: 07/03/2020 PCP: Caprice Renshaw, MD  Brief History:  79 year old male with a history of neurosyphilis, dementia, schizoaffective disorder, COPD, thrombocytopenia, seizure disorder, who is a long-term resident of a nursing facility, was brought to the hospital with progressive changes in mental status. Patient had been coming increasingly aggressive, was refusing any medications, becoming paranoid. More recently, staff had noted that he was drinking his urine. He was evaluated in the emergency room and felt that he may have a urinary tract infection was admitted to the hospital service. Further review of urine studies indicate that his positive urine culture is likely a colonization and not a true infection. He did not have any other signs of active infection. Discussed with infectious disease who agreed with not treating with antibiotics. It was felt that his progressive decline in mental status was related to his underlying behavioral health. Seen by psychiatry with recommendations for inpatient psychiatry. He is involuntarily committed. Patient is medically stable for discharge whenever he can be placed by psychiatry.  -Patient is independent with ADLs, ambulatory -patient continues to haveintermittentepisodes of aggressive behavior requiring security and constant re-direction  Assessment/Plan: 1)Progressive Behavioral Changes--- Initially felt to be related to possible UTI, but patient likely has colonization and not a true urinary tract infection. He has been refusing his medications for weeks to months.He is on several psychotropic medications such as Depakote, Haldol, Ativan. He also takes Lamictal and Keppra for seizures. His behavior changes appear to be chronic progressive in nature and appear to be secondary to his underlying mental illness. Other work-up including CT imaging of head, basic  chemistry, ammonia, TSH, ABG were unrevealing. Appreciate psychiatry input. Recommendations are for inpatient psychiatry. He is currently under involuntary commitment.---- -Inpatient psychiatric admission to a geropsychiatricfacility continues to be recommended -Patient is medically stable for discharge to inpatient psychiatric/behavioral unit when bed becomes available  --- Poor compliance with medications and persistent psychotic type behavior disturbance persist --IVC paperwork resubmitted; will further resubmitevery 7 days -Continue to follow psychiatry service for medications adjustment and further recommendations-   After further eval by psych provider-- changes to pt's regimen----  --Depakote was dced bcos it is difficult to monitor levels (pt is not always cooperative with lab draws)--- --Scheduled--c/n Amantadine, Namenda, Haldol, Prozac , Clonidine patch and Tegretol  Prn-- Im Haldol and im Ativan orderedfor agitation  2)HTN--BP is not at goal partly due to poor compliance with medications - -continue oral amlodipine and clonidine patch -BP acceptable  3)Dementia Associated with Neurosyphilis--- Review of records indicate that he was treated for neurosyphilis in 2018.  -He is chronically on Namenda which he has been refusing intermittently.  4)Thrombocytopenia/pancytopenia--patient with some degree of mild pancytopenia which is stable-- Appears to be a chronic issue. -No signs of overt bleeding.  5)Seizure disorder---- Chronically on Keppra and Lamictal. -lamictal stopped by psychiatry on 8/27 --He has been refusing these medicationsintermittently. He has IM Ativan as needed ordered if he has any breakthrough seizures.  6)Noncompliance---- Patient continues refusing medications intermittently.       Status is: Inpatient  Remains inpatient appropriate because:Unsafe d/c plan   Dispo:             Patient From:               Planned  Disposition: Behavioral Health             Expected discharge date: 09/10/20  Medically stable for discharge: Yes         Family Communication: no  Family at bedside  Consultants:  psychiatry  Code Status:  FULL   DVT Prophylaxis:  [place] Heparin   Procedures: As Listed in Progress Note Above  Antibiotics: None    Subjective: Patient walked out of building today.  Had to be escorted back by security.  No cp, n/v/d, sob.  Objective: Vitals:   09/03/20 0423 09/03/20 1436 09/04/20 1300 09/05/20 1616  BP: (!) 141/96 125/75 (!) 154/74 (!) 156/98  Pulse: 85 89 86   Resp: 18 18 18 16   Temp:  98 F (36.7 C) 97.8 F (36.6 C) 97.9 F (36.6 C)  TempSrc:  Oral Oral   SpO2: 100% 99% 100% 100%  Weight:      Height:        Intake/Output Summary (Last 24 hours) at 09/05/2020 1727 Last data filed at 09/05/2020 1030 Gross per 24 hour  Intake 1060 ml  Output --  Net 1060 ml   Weight change:  Exam:   General:  Pt is alert, follows commands appropriately, not in acute distress  HEENT: No icterus,  [place]/AT  Cardiovascular: refused  Respiratory: refused  Abdomen: refused  Extremities: refused   Data Reviewed: I have personally reviewed following labs and imaging studies Basic Metabolic Panel: No results for input(s): NA, K, CL, CO2, GLUCOSE, BUN, CREATININE, CALCIUM, MG, PHOS in the last 168 hours. Liver Function Tests: No results for input(s): AST, ALT, ALKPHOS, BILITOT, PROT, ALBUMIN in the last 168 hours. No results for input(s): LIPASE, AMYLASE in the last 168 hours. No results for input(s): AMMONIA in the last 168 hours. Coagulation Profile: No results for input(s): INR, PROTIME in the last 168 hours. CBC: No results for input(s): WBC, NEUTROABS, HGB, HCT, MCV, PLT in the last 168 hours. Cardiac Enzymes: No results for input(s): CKTOTAL, CKMB, CKMBINDEX, TROPONINI in the last 168 hours. BNP: Invalid input(s): POCBNP CBG: No  results for input(s): GLUCAP in the last 168 hours. HbA1C: No results for input(s): HGBA1C in the last 72 hours. Urine analysis:    Component Value Date/Time   COLORURINE YELLOW 07/03/2020 1510   APPEARANCEUR HAZY (A) 07/03/2020 1510   LABSPEC 1.018 07/03/2020 1510   PHURINE 7.0 07/03/2020 1510   GLUCOSEU NEGATIVE 07/03/2020 1510   HGBUR NEGATIVE 07/03/2020 1510   BILIRUBINUR NEGATIVE 07/03/2020 1510   KETONESUR NEGATIVE 07/03/2020 1510   PROTEINUR NEGATIVE 07/03/2020 1510   NITRITE POSITIVE (A) 07/03/2020 1510   LEUKOCYTESUR NEGATIVE 07/03/2020 1510   Sepsis Labs: @LABRCNTIP (procalcitonin:4,lacticidven:4) )No results found for this or any previous visit (from the past 240 hour(s)).   Scheduled Meds: . amantadine  100 mg Oral BID  . amLODipine  5 mg Oral Daily  . carbamazepine  100 mg Oral QPC breakfast  . carbamazepine  200 mg Oral BID  . cloNIDine  0.2 mg Transdermal Weekly  . FLUoxetine  40 mg Oral Daily  . folic acid  1 mg Oral Daily  . haloperidol  2 mg Oral TID  . heparin  5,000 Units Subcutaneous Q8H  . levETIRAcetam  500 mg Oral BID  . memantine  10 mg Oral QHS  . nicotine  7 mg Transdermal Daily  . [START ON 09/08/2020] paliperidone  234 mg Intramuscular Q28 days  . vitamin B-12  500 mcg Oral Daily   Continuous Infusions:  Procedures/Studies: No results found.  Orson Eva, DO  [facility]  If 7PM-7AM, please contact night-coverage [email or web address] Password  [email or web address] 09/05/2020, 5:27 PM   LOS: 63 days

## 2020-09-05 NOTE — Progress Notes (Signed)
Went out steps by room 305 and walked to main entrance and went down hallway through staff doors to stairs and eventually back up to room with coaxing.  Loma Sousa, nurse tech,  Amy, patient advocate were walking.

## 2020-09-06 NOTE — Progress Notes (Signed)
   2350  Patient walked out of room down the hallway with fists held up. He was redirected back to his room.

## 2020-09-06 NOTE — Progress Notes (Addendum)
2134- pt in good mood, singing. Had shower and ate all his dinner. Took all medication including heparin shot, no complaints.  2200- pt exit seeking, constantly coming out of room, sitter and RN unable to redirect. Pt not unpleasant, but not communicating much. Security called and was able to redirect patient to room. Pt continues to come out of room, but goes back in when he sees security present.  2300- security remains near pt's room due to pt frequently looking out of room.  0000- security no longer outside pt's room, pt fast walked to stairwell followed by sitter and 2 RNs. All present were unable to redirect pt who would not look or speak to anyone. Staff followed pt down stairwell and outside hospital to front of the building constantly attempting to verbally redirect him. Pt continued fast walking with his tongue sticking out of his mouth. Security met with staff and pt at front of building. Two Sports administrator and [employer] had to physically redirect pt back into building and up to his room. Pt was then given PRN Haldol and PRN Ativan per MAR. Pt appears to be resting comfortably, will continue to monitor.  0030- on call MD notified

## 2020-09-06 NOTE — TOC Progression Note (Signed)
Transition of Care [facility]) - Progression Note   Patient Details  Name: Henry Smith MRN: [id number] Date of Birth: [birth year]  Transition of Care [facility]) CM/SW [place], LCSW Phone Number: 09/06/2020, 1:32 PM  Clinical Narrative: CSW faxed behavioral notes to [facility] [phone]). TOC to follow.  Expected Discharge Plan: Psychiatric Hospital Barriers to Discharge: Psych Bed not available  Expected Discharge Plan and Services Expected Discharge Plan: Psychiatric Hospital In-house Referral: Clinical Social Work Living arrangements for the past 2 months: [place] Expected Discharge Date: 07/10/20                Readmission Risk Interventions Readmission Risk Prevention Plan 07/10/2020  Transportation Screening Complete  HRI or Home Care Consult Complete  Social Work Consult for [place] Planning/Counseling Complete  Palliative Care Screening Not Applicable  Medication Review [employer]) Complete  Some recent data might be hidden

## 2020-09-06 NOTE — Progress Notes (Signed)
PROGRESS NOTE  Henry Smith ASN:[id number] DOB: 12/08/1941 DOA: 07/03/2020 PCP: Caprice Renshaw, MD  Brief History: 79 year old male with a history of neurosyphilis, dementia, schizoaffective disorder, COPD, thrombocytopenia, seizure disorder, who is a long-term resident of a nursing facility, was brought to the hospital with progressive changes in mental status. Patient had been coming increasingly aggressive, was refusing any medications, becoming paranoid. More recently, staff had noted that he was drinking his urine. He was evaluated in the emergency room and felt that he may have a urinary tract infection was admitted to the hospital service. Further review of urine studies indicate that his positive urine culture is likely a colonization and not a true infection. He did not have any other signs of active infection. Discussed with infectious disease who agreed with not treating with antibiotics. It was felt that his progressive decline in mental status was related to his underlying behavioral health. Seen by psychiatry with recommendations for inpatient psychiatry. He is involuntarily committed. Patient is medically stable for discharge whenever he can be placed by psychiatry.  -Patient is independent with ADLs, ambulatory -patient continues to haveintermittentepisodes of aggressive behavior requiring security and constant re-direction -continues to intermittently defy authority and sneak out of hospital  Assessment/Plan: 1)Progressive Behavioral Changes--- Initially felt to be related to possible UTI, but patient likely has colonization and not a true urinary tract infection. He has been refusing his medications for weeks to months.He is on several psychotropic medications such as Depakote, Haldol, Ativan. He also takes Lamictal and Keppra for seizures. His behavior changes appear to be chronic progressive in nature and appear to be secondary to his underlying  mental illness. Other work-up including CT imaging of head, basic chemistry, ammonia, TSH, ABG were unrevealing. Appreciate psychiatry input. Recommendations are for inpatient psychiatry. He is currently under involuntary commitment.---- -Inpatient psychiatric admission to a geropsychiatricfacility continues to be recommended -Patient is medically stable for discharge to inpatient psychiatric/behavioral unit when bed becomes available  --- Poor compliance with medications and persistent psychotic type behavior disturbance persist --IVC paperwork resubmitted;will further resubmitevery 7 days -Continue to follow psychiatry service for medications adjustment and further recommendations- -continues intermittent trying to sneak out of building   After further eval by psych provider-- changes to pt's regimen----  --Depakote was dced bcos it is difficult to monitor levels (pt is not always cooperative with lab draws)--- --Scheduled--c/n Amantadine, Namenda, Haldol, Prozac , Clonidine patch and Tegretol  Prn-- Im Haldol and im Ativan orderedfor agitation  2)HTN--BP is not at goal partly due to poor compliance with medications - -continue oral amlodipine and clonidine patch -BP acceptable  3)Dementia Associated with Neurosyphilis--- Review of records indicate that he was treated for neurosyphilis in 2018.  -He is chronically on Namenda which he has been refusing intermittently.  4)Thrombocytopenia/pancytopenia--patient with some degree of mild pancytopenia which is stable-- Appears to be a chronic issue. -No signs of overt bleeding.  5)Seizure disorder---- Chronically on Keppra and Lamictal. -lamictal stopped by psychiatry on 8/27 --He has been refusing these medicationsintermittently. He has IM Ativan as needed ordered if he has any breakthrough seizures.  6)Noncompliance---- Patient continues refusing medications intermittently.       Status is:  Inpatient  Remains inpatient appropriate because:Unsafe d/c plan   Dispo: Patient From:  Planned Disposition: Behavioral Health Expected discharge date: 09/10/20 Medically stable for discharge: Yes         Family Communication:noFamily at bedside  Consultants:psychiatry  Code Status: FULL  DVT Prophylaxis: [place] Heparin   Procedures: As Listed in Progress Note Above  Antibiotics: None    Subjective: Patient denies fevers, chills, headache, chest pain, dyspnea, nausea, vomiting, diarrhea, abdominal pain,   Objective: Vitals:   09/04/20 1300 09/05/20 1616 09/05/20 2134 09/06/20 0517  BP: (!) 154/74 (!) 156/98 (!) 156/95 (!) 168/101  Pulse: 86  100 88  Resp: 18 16 18 15   Temp: 97.8 F (36.6 C) 97.9 F (36.6 C) 98.5 F (36.9 C) 98 F (36.7 C)  TempSrc: Oral  Oral Axillary  SpO2: 100% 100% 100% 100%  Weight:      Height:        Intake/Output Summary (Last 24 hours) at 09/06/2020 1700 Last data filed at 09/05/2020 2100 Gross per 24 hour  Intake 240 ml  Output --  Net 240 ml   Weight change:  Exam:   General:  Pt is alert, follows commands appropriately, not in acute distress  HEENT: No icterus, No thrush, [place]/AT  Cardiovascular: refused  Respiratory: refused  Abdomen:refused  Extremities: refused   Data Reviewed: I have personally reviewed following labs and imaging studies Basic Metabolic Panel: No results for input(s): NA, K, CL, CO2, GLUCOSE, BUN, CREATININE, CALCIUM, MG, PHOS in the last 168 hours. Liver Function Tests: No results for input(s): AST, ALT, ALKPHOS, BILITOT, PROT, ALBUMIN in the last 168 hours. No results for input(s): LIPASE, AMYLASE in the last 168 hours. No results for input(s): AMMONIA in the last 168 hours. Coagulation Profile: No results for input(s): INR, PROTIME in the last 168 hours. CBC: No results for input(s): WBC, NEUTROABS, HGB, HCT,  MCV, PLT in the last 168 hours. Cardiac Enzymes: No results for input(s): CKTOTAL, CKMB, CKMBINDEX, TROPONINI in the last 168 hours. BNP: Invalid input(s): POCBNP CBG: No results for input(s): GLUCAP in the last 168 hours. HbA1C: No results for input(s): HGBA1C in the last 72 hours. Urine analysis:    Component Value Date/Time   COLORURINE YELLOW 07/03/2020 1510   APPEARANCEUR HAZY (A) 07/03/2020 1510   LABSPEC 1.018 07/03/2020 1510   PHURINE 7.0 07/03/2020 1510   GLUCOSEU NEGATIVE 07/03/2020 1510   HGBUR NEGATIVE 07/03/2020 1510   BILIRUBINUR NEGATIVE 07/03/2020 1510   KETONESUR NEGATIVE 07/03/2020 1510   PROTEINUR NEGATIVE 07/03/2020 1510   NITRITE POSITIVE (A) 07/03/2020 1510   LEUKOCYTESUR NEGATIVE 07/03/2020 1510   Sepsis Labs: @LABRCNTIP (procalcitonin:4,lacticidven:4) )No results found for this or any previous visit (from the past 240 hour(s)).   Scheduled Meds: . amantadine  100 mg Oral BID  . amLODipine  5 mg Oral Daily  . carbamazepine  100 mg Oral QPC breakfast  . carbamazepine  200 mg Oral BID  . cloNIDine  0.2 mg Transdermal Weekly  . FLUoxetine  40 mg Oral Daily  . folic acid  1 mg Oral Daily  . haloperidol  2 mg Oral TID  . heparin  5,000 Units Subcutaneous Q8H  . levETIRAcetam  500 mg Oral BID  . memantine  10 mg Oral QHS  . nicotine  7 mg Transdermal Daily  . [START ON 09/08/2020] paliperidone  234 mg Intramuscular Q28 days  . vitamin B-12  500 mcg Oral Daily   Continuous Infusions:  Procedures/Studies: No results found.  Orson Eva, DO  [facility]  If 7PM-7AM, please contact night-coverage [email or web address] Password [email or web address] 09/06/2020, 5:00 PM   LOS: 64 days

## 2020-09-07 NOTE — Progress Notes (Signed)
Patient refused to allow RN to complete an assessment on him.  Patient became increasingly agitated.

## 2020-09-07 NOTE — Progress Notes (Signed)
Patient refused to take any of his medications.  Patient stated "those are generics and I do not take generic medications.  Y'all trying to kill me with those generic medicines."

## 2020-09-07 NOTE — BH Assessment (Signed)
Behavioral Health Re-assessment:  Patient was seen by TTS.  He looked at this Probation officer with a fixed stare.  He could not make any verbal response to the questions asked of him.  Asked him if he was feeling okay or if he was hurting and he made no response.  Patient continues to wait on [place] placement on Tuesday.

## 2020-09-07 NOTE — Progress Notes (Signed)
PROGRESS NOTE  Henry Smith ALP:[id number] DOB: [birth year] DOA: 07/03/2020 PCP: Caprice Renshaw, MD  Brief History: 79 year old male with a history of neurosyphilis, dementia, schizoaffective disorder, COPD, thrombocytopenia, seizure disorder, who is a long-term resident of a nursing facility, was brought to the hospital with progressive changes in mental status. Patient had been coming increasingly aggressive, was refusing any medications, becoming paranoid. More recently, staff had noted that he was drinking his urine. He was evaluated in the emergency room and felt that he may have a urinary tract infection was admitted to the hospital service. Further review of urine studies indicate that his positive urine culture is likely a colonization and not a true infection. He did not have any other signs of active infection. Discussed with infectious disease who agreed with not treating with antibiotics. It was felt that his progressive decline in mental status was related to his underlying behavioral health. Seen by psychiatry with recommendations for inpatient psychiatry. He is involuntarily committed. Patient is medically stable for discharge whenever he can be placed by psychiatry.  -Patient is independent with ADLs, ambulatory -patient continues to haveintermittentepisodes of aggressive behavior requiring security and constant re-direction -continues to intermittently defy authority and sneak out of hospital  Assessment/Plan: 1)Progressive Behavioral Changes--- Initially felt to be related to possible UTI, but patient likely has colonization and not a true urinary tract infection. He has been refusing his medications for weeks to months.He is on several psychotropic medications such as Depakote, Haldol, Ativan. He also takes Lamictal and Keppra for seizures. His behavior changes appear to be chronic progressive in nature and appear to be secondary to his underlying  mental illness. Other work-up including CT imaging of head, basic chemistry, ammonia, TSH, ABG were unrevealing. Appreciate psychiatry input. Recommendations are for inpatient psychiatry. He is currently under involuntary commitment.---- -Inpatient psychiatric admission to a geropsychiatricfacility continues to be recommended -Patient is medically stable for discharge to inpatient psychiatric/behavioral unit when bed becomes available  --- Poor compliance with medications and persistent psychotic type behavior disturbance persist --IVC paperwork resubmitted;will further resubmitevery 7 days -Continue to follow psychiatry service for medications adjustment and further recommendations- -continues intermittent trying to sneak out of building   After further eval by psych provider-- changes to pt's regimen----  --Depakote was dced bcos it is difficult to monitor levels (pt is not always cooperative with lab draws)--- --Scheduled--c/n Amantadine, Namenda, Haldol, Prozac , Clonidine patch and Tegretol  Prn-- Im Haldol and im Ativan orderedfor agitation  2)HTN--BP is not at goal partly due to poor compliance with medications - -continue oral amlodipine and clonidine patch -BP acceptable  3)Dementia Associated with Neurosyphilis--- Review of records indicate that he was treated for neurosyphilis in 2018.  -He is chronically on Namenda which he has been refusing intermittently.  4)Thrombocytopenia/pancytopenia--patient with some degree of mild pancytopenia which is stable-- Appears to be a chronic issue. -No signs of overt bleeding.  5)Seizure disorder---- Chronically on Keppra and Lamictal. -lamictal stopped by psychiatry on 8/27 --He has been refusing these medicationsintermittently. He has IM Ativan as needed ordered if he has any breakthrough seizures.  6)Noncompliance---- Patient continues refusing medications intermittently.       Status is:  Inpatient  Remains inpatient appropriate because:Unsafe d/c plan   Dispo: Patient From:  Planned Disposition: Behavioral Health Expected discharge date: 09/10/20 Medically stable for discharge: Yes         Family Communication:noFamily at bedside  Consultants:psychiatry  Code Status: FULL  DVT Prophylaxis: [place] Heparin   Procedures: As Listed in Progress Note Above  Antibiotics: None     Subjective: Patient denies fevers, chills, headache, chest pain, dyspnea, nausea, vomiting, diarrhea, abdominal pain,   Objective: Vitals:   09/04/20 1300 09/05/20 1616 09/05/20 2134 09/06/20 0517  BP: (!) 154/74 (!) 156/98 (!) 156/95 (!) 168/101  Pulse: 86  100 88  Resp: 18 16 18 15   Temp: 97.8 F (36.6 C) 97.9 F (36.6 C) 98.5 F (36.9 C) 98 F (36.7 C)  TempSrc: Oral  Oral Axillary  SpO2: 100% 100% 100% 100%  Weight:      Height:        Intake/Output Summary (Last 24 hours) at 09/07/2020 1615 Last data filed at 09/07/2020 1045 Gross per 24 hour  Intake 1080 ml  Output --  Net 1080 ml   Weight change:  Exam:   General:  Pt is alert, follows commands appropriately, not in acute distress  HEENT: No icterus, No thrush, No neck mass, [place]/AT  Cardiovascular: refused  Respiratory: refused  Abdomen: refused Extremities: refused   Data Reviewed: I have personally reviewed following labs and imaging studies Basic Metabolic Panel: No results for input(s): NA, K, CL, CO2, GLUCOSE, BUN, CREATININE, CALCIUM, MG, PHOS in the last 168 hours. Liver Function Tests: No results for input(s): AST, ALT, ALKPHOS, BILITOT, PROT, ALBUMIN in the last 168 hours. No results for input(s): LIPASE, AMYLASE in the last 168 hours. No results for input(s): AMMONIA in the last 168 hours. Coagulation Profile: No results for input(s): INR, PROTIME in the last 168 hours. CBC: No results for input(s): WBC,  NEUTROABS, HGB, HCT, MCV, PLT in the last 168 hours. Cardiac Enzymes: No results for input(s): CKTOTAL, CKMB, CKMBINDEX, TROPONINI in the last 168 hours. BNP: Invalid input(s): POCBNP CBG: No results for input(s): GLUCAP in the last 168 hours. HbA1C: No results for input(s): HGBA1C in the last 72 hours. Urine analysis:    Component Value Date/Time   COLORURINE YELLOW 07/03/2020 1510   APPEARANCEUR HAZY (A) 07/03/2020 1510   LABSPEC 1.018 07/03/2020 1510   PHURINE 7.0 07/03/2020 1510   GLUCOSEU NEGATIVE 07/03/2020 1510   HGBUR NEGATIVE 07/03/2020 1510   BILIRUBINUR NEGATIVE 07/03/2020 1510   KETONESUR NEGATIVE 07/03/2020 1510   PROTEINUR NEGATIVE 07/03/2020 1510   NITRITE POSITIVE (A) 07/03/2020 1510   LEUKOCYTESUR NEGATIVE 07/03/2020 1510   Sepsis Labs: @LABRCNTIP (procalcitonin:4,lacticidven:4) )No results found for this or any previous visit (from the past 240 hour(s)).   Scheduled Meds: . amantadine  100 mg Oral BID  . amLODipine  5 mg Oral Daily  . carbamazepine  100 mg Oral QPC breakfast  . carbamazepine  200 mg Oral BID  . cloNIDine  0.2 mg Transdermal Weekly  . FLUoxetine  40 mg Oral Daily  . folic acid  1 mg Oral Daily  . haloperidol  2 mg Oral TID  . heparin  5,000 Units Subcutaneous Q8H  . levETIRAcetam  500 mg Oral BID  . memantine  10 mg Oral QHS  . nicotine  7 mg Transdermal Daily  . [START ON 09/08/2020] paliperidone  234 mg Intramuscular Q28 days  . vitamin B-12  500 mcg Oral Daily   Continuous Infusions:  Procedures/Studies: No results found.  Orson Eva, DO  [facility]  If 7PM-7AM, please contact night-coverage [email or web address] Password [email or web address] 09/07/2020, 4:15 PM   LOS: 65 days

## 2020-09-08 NOTE — BH Assessment (Signed)
Reassessment Note:  Pt presents lying calmly in bed. He did not answer questions verbally but engaged in eye contact with monitor throughout. Pt nodded slightly when asked if he was feeling well today. He is scheduled for [facility] admission Tuesday, 09/10/20

## 2020-09-08 NOTE — Progress Notes (Signed)
PROGRESS NOTE  Henry Smith OXB:[id number] DOB: 11-Feb-1941 DOA: 07/03/2020 PCP: Caprice Renshaw, MD  Brief History: 79 year old male with a history of neurosyphilis, dementia, schizoaffective disorder, COPD, thrombocytopenia, seizure disorder, who is a long-term resident of a nursing facility, was brought to the hospital with progressive changes in mental status. Patient had been coming increasingly aggressive, was refusing any medications, becoming paranoid. More recently, staff had noted that he was drinking his urine. He was evaluated in the emergency room and felt that he may have a urinary tract infection was admitted to the hospital service. Further review of urine studies indicate that his positive urine culture is likely a colonization and not a true infection. He did not have any other signs of active infection. Discussed with infectious disease who agreed with not treating with antibiotics. It was felt that his progressive decline in mental status was related to his underlying behavioral health. Seen by psychiatry with recommendations for inpatient psychiatry. He is involuntarily committed. Patient is medically stable for discharge whenever he can be placed by psychiatry.  -Patient is independent with ADLs, ambulatory -patient continues to haveintermittentepisodes of aggressive behavior requiring security and constant re-direction -continues to intermittently defy authority and sneak out of hospital  -patient sleeps/naps most of day, getting up only to eat and use restroom -at night time he attempts to sneak out of hospital  Assessment/Plan: 1)Progressive Behavioral Changes--- Initially felt to be related to possible UTI, but patient likely has colonization and not a true urinary tract infection. He has been refusing his medications for weeks to months.He is on several psychotropic medications such as Depakote, Haldol, Ativan. He also takes Lamictal and  Keppra for seizures. His behavior changes appear to be chronic progressive in nature and appear to be secondary to his underlying mental illness. Other work-up including CT imaging of head, basic chemistry, ammonia, TSH, ABG were unrevealing. Appreciate psychiatry input. Recommendations are for inpatient psychiatry. He is currently under involuntary commitment.---- -Inpatient psychiatric admission to a geropsychiatricfacility continues to be recommended -Patient is medically stable for discharge to inpatient psychiatric/behavioral unit when bed becomes available  --- Poor compliance with medications and persistent psychotic type behavior disturbance persist --IVC paperwork resubmitted;will further resubmitevery 7 days--09/08/20 -Continue to follow psychiatry service for medications adjustment and further recommendations- -continues intermittent trying to sneak out of building   After further eval by psych provider-- changes to pt's regimen----  --Depakote was dced bcos it is difficult to monitor levels (pt is not always cooperative with lab draws)--- --Scheduled--c/n Amantadine, Namenda, Haldol, Prozac , Clonidine patch and Tegretol  Prn-- Im Haldol and im Ativan orderedfor agitation  2)HTN--BP is not at goal partly due to poor compliance with medications - -continue oral amlodipine and clonidine patch -BP acceptable  3)Dementia Associated with Neurosyphilis--- Review of records indicate that he was treated for neurosyphilis in 2018.  -He is chronically on Namenda which he has been refusing intermittently.  4)Thrombocytopenia/pancytopenia--patient with some degree of mild pancytopenia which is stable-- Appears to be a chronic issue. -No signs of overt bleeding.  5)Seizure disorder---- Chronically on Keppra and Lamictal. -lamictal stopped by psychiatry on 8/27 --He has been refusing these medicationsintermittently. He has IM Ativan as needed ordered if he has any  breakthrough seizures.  6)Noncompliance---- Patient continues refusing medications intermittently.       Status is: Inpatient  Remains inpatient appropriate because:Unsafe d/c plan   Dispo: Patient From:  Planned Disposition: Behavioral Health Expected discharge date:  09/10/20 Medically stable for discharge: Yes         Family Communication:noFamily at bedside  Consultants:psychiatry  Code Status: FULL   DVT Prophylaxis: [place] Heparin   Procedures: As Listed in Progress Note Above  Antibiotics: None      Subjective: Patient denies fevers, chills, headache, chest pain, dyspnea, nausea, vomiting, diarrhea, abdominal pain, dysuria,    Objective: Vitals:   09/04/20 1300 09/05/20 1616 09/05/20 2134 09/06/20 0517  BP: (!) 154/74 (!) 156/98 (!) 156/95 (!) 168/101  Pulse: 86  100 88  Resp: 18 16 18 15   Temp: 97.8 F (36.6 C) 97.9 F (36.6 C) 98.5 F (36.9 C) 98 F (36.7 C)  TempSrc: Oral  Oral Axillary  SpO2: 100% 100% 100% 100%  Weight:      Height:        Intake/Output Summary (Last 24 hours) at 09/08/2020 1623 Last data filed at 09/08/2020 1307 Gross per 24 hour  Intake 720 ml  Output --  Net 720 ml   Weight change:  Exam:   General:  Pt is alert, follows commands appropriately, not in acute distress  HEENT: No icterus, No thrush, No neck mass, [place]/AT  Cardiovascular: refused  Respiratory: refused  Abdomen: refused  Extremities: refused   Data Reviewed: I have personally reviewed following labs and imaging studies Basic Metabolic Panel: No results for input(s): NA, K, CL, CO2, GLUCOSE, BUN, CREATININE, CALCIUM, MG, PHOS in the last 168 hours. Liver Function Tests: No results for input(s): AST, ALT, ALKPHOS, BILITOT, PROT, ALBUMIN in the last 168 hours. No results for input(s): LIPASE, AMYLASE in the last 168 hours. No results for input(s): AMMONIA in  the last 168 hours. Coagulation Profile: No results for input(s): INR, PROTIME in the last 168 hours. CBC: No results for input(s): WBC, NEUTROABS, HGB, HCT, MCV, PLT in the last 168 hours. Cardiac Enzymes: No results for input(s): CKTOTAL, CKMB, CKMBINDEX, TROPONINI in the last 168 hours. BNP: Invalid input(s): POCBNP CBG: No results for input(s): GLUCAP in the last 168 hours. HbA1C: No results for input(s): HGBA1C in the last 72 hours. Urine analysis:    Component Value Date/Time   COLORURINE YELLOW 07/03/2020 1510   APPEARANCEUR HAZY (A) 07/03/2020 1510   LABSPEC 1.018 07/03/2020 1510   PHURINE 7.0 07/03/2020 1510   GLUCOSEU NEGATIVE 07/03/2020 1510   HGBUR NEGATIVE 07/03/2020 1510   BILIRUBINUR NEGATIVE 07/03/2020 1510   KETONESUR NEGATIVE 07/03/2020 1510   PROTEINUR NEGATIVE 07/03/2020 1510   NITRITE POSITIVE (A) 07/03/2020 1510   LEUKOCYTESUR NEGATIVE 07/03/2020 1510   Sepsis Labs: @LABRCNTIP (procalcitonin:4,lacticidven:4) )No results found for this or any previous visit (from the past 240 hour(s)).   Scheduled Meds: . amantadine  100 mg Oral BID  . amLODipine  5 mg Oral Daily  . carbamazepine  100 mg Oral QPC breakfast  . carbamazepine  200 mg Oral BID  . cloNIDine  0.2 mg Transdermal Weekly  . FLUoxetine  40 mg Oral Daily  . folic acid  1 mg Oral Daily  . haloperidol  2 mg Oral TID  . heparin  5,000 Units Subcutaneous Q8H  . levETIRAcetam  500 mg Oral BID  . memantine  10 mg Oral QHS  . nicotine  7 mg Transdermal Daily  . paliperidone  234 mg Intramuscular Q28 days  . vitamin B-12  500 mcg Oral Daily   Continuous Infusions:  Procedures/Studies: No results found.  Orson Eva, DO  [facility]  If 7PM-7AM, please contact night-coverage [email or web address] Password [facility] 09/08/2020,  4:23 PM   LOS: 66 days

## 2020-09-08 NOTE — Progress Notes (Signed)
Pt has refused to allow me to complete a physical assessment on him today. Pt has also refused all medications despite mutliple attempts. Pt has eaten his meals then laid back in bed, only getting up to go to the bathroom. Speaks only in one word sentences, I.e. what, yes or no.

## 2020-09-08 NOTE — TOC Progression Note (Signed)
Transition of Care [facility]) - Progression Note   Patient Details  Name: Slater Mcmanaman MRN: [id number] Date of Birth: January 19, 1941  Transition of Care [facility] CM/SW [place], LCSW Phone Number: 09/08/2020, 2:07 PM  Clinical Narrative: IVC paperwork completed and faxed to magistrate's office. CSW called magistrate's office to confirm paperwork was received. CSW faxed behavioral notes to [facility]. TOC to follow.   Expected Discharge Plan: Psychiatric Hospital Barriers to Discharge: Psych Bed not available  Expected Discharge Plan and Services Expected Discharge Plan: Psychiatric Hospital In-house Referral: Clinical Social Work Living arrangements for the past 2 months: [place] Expected Discharge Date: 07/10/20                Readmission Risk Interventions Readmission Risk Prevention Plan 07/10/2020  Transportation Screening Complete  HRI or Home Care Consult Complete  Social Work Consult for [place] Planning/Counseling Complete  Palliative Care Screening Not Applicable  Medication Review Press photographer) Complete  Some recent data might be hidden

## 2020-09-09 MED ORDER — CLONIDINE 0.2 MG/24HR TD PTWK: 0.2000 mg | MEDICATED_PATCH | TRANSDERMAL | 0 refills | Status: AC

## 2020-09-09 MED ORDER — AMLODIPINE BESYLATE 5 MG PO TABS: 5.0000 mg | ORAL_TABLET | Freq: Every day | ORAL | 0 refills | Status: AC

## 2020-09-09 MED ORDER — FOLIC ACID 1 MG PO TABS: 1.0000 mg | ORAL_TABLET | Freq: Every day | ORAL | Status: AC

## 2020-09-09 MED ORDER — CARBAMAZEPINE 100 MG PO CHEW: 100.0000 mg | CHEWABLE_TABLET | Freq: Every day | ORAL | 0 refills | Status: AC

## 2020-09-09 MED ORDER — CARBAMAZEPINE 100 MG PO CHEW: 200.0000 mg | CHEWABLE_TABLET | Freq: Two times a day (BID) | ORAL | 0 refills | Status: AC

## 2020-09-09 MED ORDER — PALIPERIDONE PALMITATE ER 234 MG/1.5ML IM SUSY: 234.0000 mg | PREFILLED_SYRINGE | INTRAMUSCULAR | Status: AC

## 2020-09-09 MED ORDER — NICOTINE 7 MG/24HR TD PT24: 7.0000 mg | MEDICATED_PATCH | Freq: Every day | TRANSDERMAL | 0 refills | Status: AC

## 2020-09-09 MED ORDER — CYANOCOBALAMIN 500 MCG PO TABS: 500.0000 ug | ORAL_TABLET | Freq: Every day | ORAL | Status: AC

## 2020-09-09 NOTE — TOC CAGE-AID Note (Signed)
Transition of Care [facility]) - CAGE-AID Screening   Patient Details  Name: Henry Smith MRN: [id number] Date of Birth: 06-Apr-1941  Transition of Care [facility]) CM/SW Contact:    Natasha Bence, LCSW Phone Number: 09/09/2020, 7:03 PM   Clinical Narrative: CSW received call from [facility] that central regional requested documentation of Covid test, 5 days of vitals, 5 days of medications, IVC paperwork, last 5 days of ED notes, Regional referral from [place], and Letter from patiant's guardian approving of transfer. [facility] [employer] rep Barbaraann Rondo reported that the Letter form the guardian is not due immediately. CSW requested Covid test from MD. CSW faxed 5 days of vitals including documentation of days that patient refused vitals, 5 days of medication, and the last 5 days of RN notes from the 300 floor since patient is no longer in the ED. CSW will provide remainder of paperwork upon discharge. Fax number provided is [phone]. TOC to follow.   CAGE-AID Screening:

## 2020-09-09 NOTE — Progress Notes (Signed)
Pt refused vital signs and to allow RN to perform complete assessment.

## 2020-09-09 NOTE — TOC Progression Note (Signed)
Transition of Care [facility]) - Progression Note   Patient Details  Name: Henry Smith MRN: [id number] Date of Birth: 1941/01/09  Transition of Care [facility]) CM/SW [place], LCSW Phone Number: 09/09/2020, 12:34 PM  Clinical Narrative: CSW faxed behavioral notes to [facility]. TOC to follow.  Expected Discharge Plan: Psychiatric Hospital Barriers to Discharge: Psych Bed not available  Expected Discharge Plan and Services Expected Discharge Plan: Psychiatric Hospital In-house Referral: Clinical Social Work Living arrangements for the past 2 months: [place] Expected Discharge Date: 07/10/20                Readmission Risk Interventions Readmission Risk Prevention Plan 07/10/2020  Transportation Screening Complete  HRI or Home Care Consult Complete  Social Work Consult for [place] Planning/Counseling Complete  Palliative Care Screening Not Applicable  Medication Review Press photographer) Complete  Some recent data might be hidden

## 2020-09-09 NOTE — Discharge Summary (Signed)
Physician Discharge Summary  Henry Smith CBJ:[id number] DOB: August 11, 1941 DOA: 07/03/2020  PCP: Caprice Renshaw, MD  Admit date: 07/03/2020 Discharge date: 09/10/2020  Admitted From: ALF Disposition:  [facility]  Recommendations for Outpatient Follow-up:  1. Please obtain BMP/CBC in one week   Discharge Condition: Stable CODE STATUS: FULL Diet recommendation: Heart Healthy    Brief/Interim Summary: 79 year old male with a history of neurosyphilis, dementia, schizoaffective disorder, COPD, thrombocytopenia, seizure disorder, who is a long-term resident of a nursing facility, was brought to the hospital with progressive changes in mental status. Patient had been coming increasingly aggressive, was refusing any medications, becoming paranoid. More recently, staff had noted that he was drinking his urine. He was evaluated in the emergency room and felt that he may have a urinary tract infection was admitted to the hospital service. Further review of urine studies indicate that his positive urine culture is likely a colonization and not a true infection. He did not have any other signs of active infection. Discussed with infectious disease who agreed with not treating with antibiotics. It was felt that his progressive decline in mental status was related to his underlying behavioral health. Seen by psychiatry with recommendations for inpatient psychiatry. He is involuntarily committed. Patient is medically stable for discharge whenever he can be placed by psychiatry.  -Patient is independent with ADLs, ambulatory -patient continues to haveintermittentepisodes of aggressive behavior requiring security and constant re-direction -continues to intermittently defy authority and sneak out of hospital  -patient sleeps/naps most of day, getting up only to eat and use restroom -at night time he attempts to sneak out of hospital Psychiatry continued to follow the patient -overall  aggressive behavior has slowly improved but continues to have intermittent outbursts requiring security assistance -With adjustments of his psychotropic meds, the patient was more re-directable although he would intermittently refuse medications -he remained full independent with his ADLs without any assistance  Discharge Diagnoses:   1)Progressive Behavioral Changes--- Initially felt to be related to possible UTI, but patient likely has colonization and not a true urinary tract infection. He has been refusing his medications for weeks to months.He is on several psychotropic medications such as Depakote, Haldol, Ativan. He also takes Lamictal and Keppra for seizures. His behavior changes appear to be chronic progressive in nature and appear to be secondary to his underlying mental illness. Other work-up including CT imaging of head, basic chemistry, ammonia, TSH, ABG were unrevealing. Appreciate psychiatry input. Recommendations are for inpatient psychiatry. He is currently under involuntary commitment.---- -Inpatient psychiatric admission to a geropsychiatricfacility continues to be recommended -Patient is medically stable for discharge to inpatient psychiatric/behavioral unit when bed becomes available  --- Poor compliance with medications and persistent psychotic type behavior disturbance persist --IVC paperwork resubmitted;will further resubmitevery 7 days--09/08/20 -Continue to follow psychiatry service for medications adjustment and further recommendations- -continues intermittent trying to sneak out of building -patient refused Invega dose on 09/08/20   After further eval by psych provider-- changes to pt's regimen----  --Depakote was dced bcos it is difficult to monitor levels (pt is not always cooperative with lab draws)--- --Scheduled--c/n Amantadine, Namenda, Haldol, Prozac , Clonidine patch and Tegretol  Prn-- Im Haldol and im Ativan orderedfor agitation  2)HTN--BP  is not at goal partly due to poor compliance with medications - -continue oral amlodipine and clonidine patch -BP acceptable  3)Dementia Associated with Neurosyphilis--- Review of records indicate that he was treated for neurosyphilis in 2018.  -He is chronically on Namenda which he has been refusing  intermittently.  4)Thrombocytopenia/pancytopenia--patient with some degree of mild pancytopenia which is stable-- Appears to be a chronic issue. -No signs of overt bleeding.  5)Seizure disorder---- Chronically on Keppra and Lamictal. -lamictal stopped by psychiatry on 8/27 --He has been refusing these medicationsintermittently. He has IM Ativan as needed ordered if he has any breakthrough seizures.  6)Noncompliance---- Patient continues refusing medications intermittently.     Discharge Instructions   Allergies as of 09/10/2020      Reactions   Penicillins Swelling         Medication List    STOP taking these medications   cephALEXin 500 MG capsule Commonly known as: KEFLEX   divalproex 125 MG capsule Commonly known as: DEPAKOTE SPRINKLE   lamoTRIgine 25 MG tablet Commonly known as: LAMICTAL   latanoprost 0.005 % ophthalmic solution Commonly known as: XALATAN   nitrofurantoin (macrocrystal-monohydrate) 100 MG capsule Commonly known as: MACROBID   PARoxetine 20 MG tablet Commonly known as: PAXIL     TAKE these medications   acetaminophen 500 MG tablet Commonly known as: TYLENOL Take 500 mg by mouth every 6 (six) hours as needed for mild pain or moderate pain.   amantadine 100 MG capsule Commonly known as: SYMMETREL Take 1 capsule (100 mg total) by mouth 2 (two) times daily.   amLODipine 5 MG tablet Commonly known as: NORVASC Take 1 tablet (5 mg total) by mouth daily. What changed:   medication strength  how much to take   carbamazepine 100 MG chewable tablet Commonly known as: TEGRETOL Chew 2 tablets (200 mg total) by mouth 2 (two) times  daily.   carbamazepine 100 MG chewable tablet Commonly known as: TEGRETOL Chew 1 tablet (100 mg total) by mouth daily after breakfast.   cloNIDine 0.2 mg/24hr patch Commonly known as: CATAPRES - Dosed in mg/24 hr Place 1 patch (0.2 mg total) onto the skin once a week. Start taking on: September 12, 2020   FLUoxetine 40 MG capsule Commonly known as: PROZAC Take 40 mg by mouth daily.   folic acid 1 MG tablet Commonly known as: FOLVITE Take 1 tablet (1 mg total) by mouth daily.   haloperidol 2 MG/ML solution Commonly known as: HALDOL Take 2 mg by mouth 3 (three) times daily with meals. "Mix with juice and do not tell patient that juice has medication related to schizophrenia"   levETIRAcetam 500 MG tablet Commonly known as: Keppra Take 1 tablet (500 mg total) by mouth 2 (two) times daily.   LORazepam 2 MG/ML concentrated solution Commonly known as: ATIVAN Take 0.5 mLs (1 mg total) by mouth every 8 (eight) hours as needed for anxiety or seizure.   memantine 5 MG tablet Commonly known as: NAMENDA Take 5 mg by mouth at bedtime.   nicotine 7 mg/24hr patch Commonly known as: NICODERM CQ - dosed in mg/24 hr Place 1 patch (7 mg total) onto the skin daily.   paliperidone 234 MG/1.5ML Susy injection Commonly known as: INVEGA SUSTENNA Inject 234 mg into the muscle every 28 (twenty-eight) days. Start taking on: October 06, 2020   vitamin B-12 500 MCG tablet Commonly known as: CYANOCOBALAMIN Take 1 tablet (500 mcg total) by mouth daily.       Allergies  Allergen Reactions  . Penicillins Swelling         Consultations:  psychiatry   Procedures/Studies: No results found.      Discharge Exam: Vitals:   09/06/20 0517 09/09/20 1302  BP: (!) 168/101 128/67  Pulse: 88 87  Resp:  15 18  Temp: 98 F (36.7 C) 98.6 F (37 C)  SpO2: 100% 97%   Vitals:   09/05/20 1616 09/05/20 2134 09/06/20 0517 09/09/20 1302  BP: (!) 156/98 (!) 156/95 (!) 168/101 128/67  Pulse:   100 88 87  Resp: 16 18 15 18   Temp: 97.9 F (36.6 C) 98.5 F (36.9 C) 98 F (36.7 C) 98.6 F (37 C)  TempSrc:  Oral Axillary Oral  SpO2: 100% 100% 100% 97%  Weight:      Height:        General: Pt is alert, awake, not in acute distress Cardiovascular: continues to refuse Respiratory: continues to refuse Abdominal: continues to refuse Extremities: continues to refuse   The results of significant diagnostics from this hospitalization (including imaging, microbiology, ancillary and laboratory) are listed below for reference.    Significant Diagnostic Studies: No results found.   Microbiology: Recent Results (from the past 240 hour(s))  Respiratory Panel by RT PCR (Flu A&B, Covid) - Nasopharyngeal Swab     Status: None   Collection Time: 09/09/20  7:48 PM   Specimen: Nasopharyngeal Swab  Result Value Ref Range Status   SARS Coronavirus 2 by RT PCR NEGATIVE NEGATIVE Final    Comment: (NOTE) SARS-CoV-2 target nucleic acids are NOT DETECTED.  The SARS-CoV-2 RNA is generally detectable in upper respiratoy specimens during the acute phase of infection. The lowest concentration of SARS-CoV-2 viral copies this assay can detect is 131 copies/mL. A negative result does not preclude SARS-Cov-2 infection and should not be used as the sole basis for treatment or other patient management decisions. A negative result may occur with  improper specimen collection/handling, submission of specimen other than nasopharyngeal swab, presence of viral mutation(s) within the areas targeted by this assay, and inadequate number of viral copies (<131 copies/mL). A negative result must be combined with clinical observations, patient history, and epidemiological information. The expected result is Negative.  Fact Sheet for Patients:  PinkCheek.be  Fact Sheet for Healthcare Providers:  GravelBags.it  This test is no t yet approved or  cleared by the [place] FDA and  has been authorized for detection and/or diagnosis of SARS-CoV-2 by FDA under an Emergency Use Authorization (EUA). This EUA will remain  in effect (meaning this test can be used) for the duration of the COVID-19 declaration under Section 564(b)(1) of the Act, 21 U.S.C. section 360bbb-3(b)(1), unless the authorization is terminated or revoked sooner.     Influenza A by PCR NEGATIVE NEGATIVE Final   Influenza B by PCR NEGATIVE NEGATIVE Final    Comment: (NOTE) The Xpert Xpress SARS-CoV-2/FLU/RSV assay is intended as an aid in  the diagnosis of influenza from Nasopharyngeal swab specimens and  should not be used as a sole basis for treatment. Nasal washings and  aspirates are unacceptable for Xpert Xpress SARS-CoV-2/FLU/RSV  testing.  Fact Sheet for Patients: PinkCheek.be  Fact Sheet for Healthcare Providers: GravelBags.it  This test is not yet approved or cleared by the [place] FDA and  has been authorized for detection and/or diagnosis of SARS-CoV-2 by  FDA under an Emergency Use Authorization (EUA). This EUA will remain  in effect (meaning this test can be used) for the duration of the  Covid-19 declaration under Section 564(b)(1) of the Act, 21  U.S.C. section 360bbb-3(b)(1), unless the authorization is  terminated or revoked. Performed at [facility], [address], [place], [place] [address]      Labs: Basic Metabolic Panel: No results for input(s):  NA, K, CL, CO2, GLUCOSE, BUN, CREATININE, CALCIUM, MG, PHOS in the last 168 hours. Liver Function Tests: No results for input(s): AST, ALT, ALKPHOS, BILITOT, PROT, ALBUMIN in the last 168 hours. No results for input(s): LIPASE, AMYLASE in the last 168 hours. No results for input(s): AMMONIA in the last 168 hours. CBC: No results for input(s): WBC, NEUTROABS, HGB, HCT, MCV, PLT in the last 168 hours. Cardiac Enzymes: No  results for input(s): CKTOTAL, CKMB, CKMBINDEX, TROPONINI in the last 168 hours. BNP: Invalid input(s): POCBNP CBG: No results for input(s): GLUCAP in the last 168 hours.  Time coordinating discharge:  36 minutes  Signed:  Orson Eva, DO [facility] Pager: [phone] 09/10/2020, 8:07 AM

## 2020-09-09 NOTE — BH Assessment (Signed)
Reassessment Note: Pt presents lying on his back in hospital bed. He maintains gaze toward screen, but does not verbally interact at this time. RN notes pt said he feels tired. Inpt tx continues to be recommendation.

## 2020-09-09 NOTE — Plan of Care (Signed)
  Problem: Education: Goal: Knowledge of General Education information will improve Description: Including pain rating scale, medication(s)/side effects and non-pharmacologic comfort measures Outcome: Not Progressing   Problem: Health Behavior/Discharge Planning: Goal: Ability to manage health-related needs will improve Outcome: Not Progressing   Problem: Coping: Goal: Level of anxiety will decrease Outcome: Not Progressing

## 2020-09-09 NOTE — Progress Notes (Signed)
PROGRESS NOTE  Henry Smith JKD:[id number] DOB: 1941-04-04 DOA: 07/03/2020 PCP: Caprice Renshaw, MD   Brief History: 79 year old male with a history of neurosyphilis, dementia, schizoaffective disorder, COPD, thrombocytopenia, seizure disorder, who is a long-term resident of a nursing facility, was brought to the hospital with progressive changes in mental status. Patient had been coming increasingly aggressive, was refusing any medications, becoming paranoid. More recently, staff had noted that he was drinking his urine. He was evaluated in the emergency room and felt that he may have a urinary tract infection was admitted to the hospital service. Further review of urine studies indicate that his positive urine culture is likely a colonization and not a true infection. He did not have any other signs of active infection. Discussed with infectious disease who agreed with not treating with antibiotics. It was felt that his progressive decline in mental status was related to his underlying behavioral health. Seen by psychiatry with recommendations for inpatient psychiatry. He is involuntarily committed. Patient is medically stable for discharge whenever he can be placed by psychiatry.  -Patient is independent with ADLs, ambulatory -patient continues to haveintermittentepisodes of aggressive behavior requiring security and constant re-direction -continues to intermittently defy authority and sneak out of hospital  -patient sleeps/naps most of day, getting up only to eat and use restroom -at night time he attempts to sneak out of hospital  Assessment/Plan: 1)Progressive Behavioral Changes--- Initially felt to be related to possible UTI, but patient likely has colonization and not a true urinary tract infection. He has been refusing his medications for weeks to months.He is on several psychotropic medications such as Depakote, Haldol, Ativan. He also takes Lamictal and  Keppra for seizures. His behavior changes appear to be chronic progressive in nature and appear to be secondary to his underlying mental illness. Other work-up including CT imaging of head, basic chemistry, ammonia, TSH, ABG were unrevealing. Appreciate psychiatry input. Recommendations are for inpatient psychiatry. He is currently under involuntary commitment.---- -Inpatient psychiatric admission to a geropsychiatricfacility continues to be recommended -Patient is medically stable for discharge to inpatient psychiatric/behavioral unit when bed becomes available  --- Poor compliance with medications and persistent psychotic type behavior disturbance persist --IVC paperwork resubmitted;will further resubmitevery 7 days--09/08/20 -Continue to follow psychiatry service for medications adjustment and further recommendations- -continues intermittent trying to sneak out of building   After further eval by psych provider-- changes to pt's regimen----  --Depakote was dced bcos it is difficult to monitor levels (pt is not always cooperative with lab draws)--- --Scheduled--c/n Amantadine, Namenda, Haldol, Prozac , Clonidine patch and Tegretol  Prn-- Im Haldol and im Ativan orderedfor agitation  2)HTN--BP is not at goal partly due to poor compliance with medications - -continue oral amlodipine and clonidine patch -BP acceptable  3)Dementia Associated with Neurosyphilis--- Review of records indicate that he was treated for neurosyphilis in 2018.  -He is chronically on Namenda which he has been refusing intermittently.  4)Thrombocytopenia/pancytopenia--patient with some degree of mild pancytopenia which is stable-- Appears to be a chronic issue. -No signs of overt bleeding.  5)Seizure disorder---- Chronically on Keppra and Lamictal. -lamictal stopped by psychiatry on 8/27 --He has been refusing these medicationsintermittently. He has IM Ativan as needed ordered if he has any  breakthrough seizures.  6)Noncompliance---- Patient continues refusing medications intermittently.       Status is: Inpatient  Remains inpatient appropriate because:Unsafe d/c plan   Dispo: Patient From:  Planned Disposition: Behavioral Health Expected discharge  date: 09/10/20 Medically stable for discharge: Yes         Family Communication:noFamily at bedside  Consultants:psychiatry  Code Status: FULL   DVT Prophylaxis: [place] Heparin   Procedures: As Listed in Progress Note Above  Antibiotics: None      Subjective: Patient denies fevers, chills, headache, chest pain, dyspnea, nausea, vomiting, diarrhea, abdominal pain, dysuria,    Objective: Vitals:   09/05/20 1616 09/05/20 2134 09/06/20 0517 09/09/20 1302  BP: (!) 156/98 (!) 156/95 (!) 168/101 128/67  Pulse:  100 88 87  Resp: 16 18 15 18   Temp: 97.9 F (36.6 C) 98.5 F (36.9 C) 98 F (36.7 C) 98.6 F (37 C)  TempSrc:  Oral Axillary Oral  SpO2: 100% 100% 100% 97%  Weight:      Height:        Intake/Output Summary (Last 24 hours) at 09/09/2020 1741 Last data filed at 09/09/2020 1204 Gross per 24 hour  Intake 1200 ml  Output --  Net 1200 ml   Weight change:  Exam:   General:  Pt is alert, follows commands appropriately, not in acute distress  HEENT: No icterus, No thrush, No neck mass, [place]/AT  Cardiovascular: Refuses  Respiratory: refuses  Abdomen: refuses  Extremities: refuses   Data Reviewed: I have personally reviewed following labs and imaging studies Basic Metabolic Panel: No results for input(s): NA, K, CL, CO2, GLUCOSE, BUN, CREATININE, CALCIUM, MG, PHOS in the last 168 hours. Liver Function Tests: No results for input(s): AST, ALT, ALKPHOS, BILITOT, PROT, ALBUMIN in the last 168 hours. No results for input(s): LIPASE, AMYLASE in the last 168 hours. No results for input(s): AMMONIA in the  last 168 hours. Coagulation Profile: No results for input(s): INR, PROTIME in the last 168 hours. CBC: No results for input(s): WBC, NEUTROABS, HGB, HCT, MCV, PLT in the last 168 hours. Cardiac Enzymes: No results for input(s): CKTOTAL, CKMB, CKMBINDEX, TROPONINI in the last 168 hours. BNP: Invalid input(s): POCBNP CBG: No results for input(s): GLUCAP in the last 168 hours. HbA1C: No results for input(s): HGBA1C in the last 72 hours. Urine analysis:    Component Value Date/Time   COLORURINE YELLOW 07/03/2020 1510   APPEARANCEUR HAZY (A) 07/03/2020 1510   LABSPEC 1.018 07/03/2020 1510   PHURINE 7.0 07/03/2020 1510   GLUCOSEU NEGATIVE 07/03/2020 1510   HGBUR NEGATIVE 07/03/2020 1510   BILIRUBINUR NEGATIVE 07/03/2020 1510   KETONESUR NEGATIVE 07/03/2020 1510   PROTEINUR NEGATIVE 07/03/2020 1510   NITRITE POSITIVE (A) 07/03/2020 1510   LEUKOCYTESUR NEGATIVE 07/03/2020 1510   Sepsis Labs: @LABRCNTIP (procalcitonin:4,lacticidven:4) )No results found for this or any previous visit (from the past 240 hour(s)).   Scheduled Meds: . amantadine  100 mg Oral BID  . amLODipine  5 mg Oral Daily  . carbamazepine  100 mg Oral QPC breakfast  . carbamazepine  200 mg Oral BID  . cloNIDine  0.2 mg Transdermal Weekly  . FLUoxetine  40 mg Oral Daily  . folic acid  1 mg Oral Daily  . haloperidol  2 mg Oral TID  . heparin  5,000 Units Subcutaneous Q8H  . levETIRAcetam  500 mg Oral BID  . memantine  10 mg Oral QHS  . nicotine  7 mg Transdermal Daily  . paliperidone  234 mg Intramuscular Q28 days  . vitamin B-12  500 mcg Oral Daily   Continuous Infusions:  Procedures/Studies: No results found.  Orson Eva, DO  [facility]  If 7PM-7AM, please contact night-coverage [email or web address] Password [facility] 09/09/2020,  5:41 PM   LOS: 67 days

## 2020-09-09 NOTE — Progress Notes (Signed)
Clinician received a call from [facility] at [facility] requesting information to be faxed.  She  requested: 1. Regional Referral from [facility] 2. Last 5 days of ED notes 3. IVC paperwork 4. Last 5 days of medication 5. Last 5 days of vital signs 6.  Covid test within the past 48 hours 7. A letter from his guardianship(This can be sent at a later date)   Items 1-6 has to be sent to [facility], [facility], before they will accept him.  They have a bed waiting for him.  Clinician spoke with Natasha Bence, LCSW at [facility] and passed along this information.  Mrs. Glenford Peers said she would fax over all the information she had and inquire about the additional paperwork that is not on file.  [facility] RN fax number [phone]  [place] Disposition, CSW [phone] (cell)

## 2020-09-10 LAB — RESPIRATORY PANEL BY RT PCR (FLU A&B, COVID)
Influenza A by PCR: NEGATIVE
Influenza B by PCR: NEGATIVE
SARS Coronavirus 2 by RT PCR: NEGATIVE

## 2020-09-10 NOTE — Progress Notes (Addendum)
CSW spoke with [facility] admissions RN. They have not received pt's IVC paperwork at this time. CSW will reach out to [facility] ED CSWs. [facility] staff are also stating that they "no longer have" the state hospital application and authorization number from the [place]. This Probation officer will complete this request and fax it to [facility] asap.    Audree Camel, MSW, LCSW, LCAS Clinical Social Worker II Disposition CSW [phone]    UPDATE: 1030am: A new state hospital referral application has been completed and faxed to [facility] and [place]. Currently waiting for authorization number from [place] requested the process be expedited at [facility] as pt is being transported to [facility] today.     UPDATE: 1140am: [facility] have not reviewed request at this time. CSW again requested that request for authorization be expedited.     UPDATE: 1230pm: Authorization from [place]: [id number]. Juliann Pulse at [facility] ED notified.

## 2020-09-10 NOTE — Care Management Important Message (Signed)
Important Message  Patient Details  Name: Henry Smith MRN: [id number] Date of Birth: 05/09/1941   Medicare Important Message Given:  Yes (copy mailed to [facility] at [address], [place], [id number])     Tommy Medal 09/10/2020, 3:15 PM

## 2020-09-10 NOTE — Progress Notes (Signed)
Report called to Ramoni at [facility].  Patient taken by PD in wheelchair with no resistance.

## 2020-09-10 NOTE — TOC Transition Note (Signed)
Transition of Care [facility]) - CM/SW Discharge Note   Patient Details  Name: Henry Smith MRN: [id number] Date of Birth: 1941/02/06  Transition of Care [facility]) CM/SW Contact:  Shade Flood, LCSW Phone Number: 09/10/2020, 3:24 PM   Clinical Narrative:     Requested clinical for patient sent to [facility] at 9:00AM today. Have sent additional clinical and provided information to RN at [facility] several times throughout the day. [facility] has now officially cleared pt for admission to their facility. Updated charge RN who will call Sheriff[employer] Department for transport. RN to call report once transport has arrived for pt at [facility] request. Charge RN aware.  Updated pt's guardian this AM and she is in agreement with the dc plan.  There are no other TOC needs identified for dc.    Final next level of care: Psychiatric Hospital Barriers to Discharge: Barriers Resolved   Patient Goals and CMS Choice        Discharge Placement                       Discharge Plan and Services In-house Referral: Clinical Social Work                                   Social Determinants of Health (SDOH) Interventions     Readmission Risk Interventions Readmission Risk Prevention Plan 07/10/2020  Transportation Screening Complete  HRI or [place] Complete  Social Work Consult for [place] Planning/Counseling Complete  Palliative Care Screening Not Applicable  Medication Review [employer]) Complete  Some recent data might be hidden

## 2024-02-12 DEATH — deceased
